# Patient Record
Sex: Female | Born: 1935
Health system: Southern US, Community
[De-identification: ages and names within clinical notes are randomized; demographics above are authoritative.]

## PROBLEM LIST (undated history)

## (undated) DIAGNOSIS — K120 Recurrent oral aphthae: Secondary | ICD-10-CM

## (undated) DIAGNOSIS — K5730 Diverticulosis of large intestine without perforation or abscess without bleeding: Secondary | ICD-10-CM

## (undated) DIAGNOSIS — J309 Allergic rhinitis, unspecified: Secondary | ICD-10-CM

## (undated) DIAGNOSIS — K5900 Constipation, unspecified: Secondary | ICD-10-CM

## (undated) DIAGNOSIS — IMO0002 Reserved for concepts with insufficient information to code with codable children: Secondary | ICD-10-CM

## (undated) DIAGNOSIS — R1310 Dysphagia, unspecified: Secondary | ICD-10-CM

## (undated) DIAGNOSIS — E039 Hypothyroidism, unspecified: Secondary | ICD-10-CM

## (undated) DIAGNOSIS — I10 Essential (primary) hypertension: Secondary | ICD-10-CM

## (undated) DIAGNOSIS — C241 Malignant neoplasm of ampulla of Vater: Secondary | ICD-10-CM

## (undated) DIAGNOSIS — B351 Tinea unguium: Secondary | ICD-10-CM

## (undated) DIAGNOSIS — I739 Peripheral vascular disease, unspecified: Secondary | ICD-10-CM

## (undated) DIAGNOSIS — J3089 Other allergic rhinitis: Secondary | ICD-10-CM

## (undated) DIAGNOSIS — Z800 Family history of malignant neoplasm of digestive organs: Secondary | ICD-10-CM

## (undated) DIAGNOSIS — N6019 Diffuse cystic mastopathy of unspecified breast: Secondary | ICD-10-CM

## (undated) DIAGNOSIS — K222 Esophageal obstruction: Secondary | ICD-10-CM

## (undated) DIAGNOSIS — M25559 Pain in unspecified hip: Secondary | ICD-10-CM

## (undated) DIAGNOSIS — I82409 Acute embolism and thrombosis of unspecified deep veins of unspecified lower extremity: Secondary | ICD-10-CM

## (undated) DIAGNOSIS — K219 Gastro-esophageal reflux disease without esophagitis: Secondary | ICD-10-CM

## (undated) DIAGNOSIS — M810 Age-related osteoporosis without current pathological fracture: Secondary | ICD-10-CM

## (undated) DIAGNOSIS — K859 Acute pancreatitis without necrosis or infection, unspecified: Secondary | ICD-10-CM

## (undated) DIAGNOSIS — R7989 Other specified abnormal findings of blood chemistry: Secondary | ICD-10-CM

## (undated) DIAGNOSIS — R002 Palpitations: Secondary | ICD-10-CM

## (undated) DIAGNOSIS — K648 Other hemorrhoids: Secondary | ICD-10-CM

## (undated) DIAGNOSIS — M1990 Unspecified osteoarthritis, unspecified site: Secondary | ICD-10-CM

## (undated) DIAGNOSIS — R498 Other voice and resonance disorders: Secondary | ICD-10-CM

## (undated) DIAGNOSIS — R1013 Epigastric pain: Secondary | ICD-10-CM

## (undated) DIAGNOSIS — E669 Obesity, unspecified: Secondary | ICD-10-CM

## (undated) DIAGNOSIS — K449 Diaphragmatic hernia without obstruction or gangrene: Secondary | ICD-10-CM

## (undated) DIAGNOSIS — E785 Hyperlipidemia, unspecified: Secondary | ICD-10-CM

## (undated) HISTORY — DX: Allergic rhinitis, unspecified: J30.9

## (undated) HISTORY — DX: Epigastric pain: R10.13

## (undated) HISTORY — DX: Other voice and resonance disorders: R49.8

## (undated) HISTORY — DX: Recurrent oral aphthae: K12.0

## (undated) HISTORY — PX: TONSILLECTOMY AND ADENOIDECTOMY: SUR1326

## (undated) HISTORY — DX: Palpitations: R00.2

## (undated) HISTORY — PX: CATARACT EXTRACTION, BILATERAL: SHX1313

## (undated) HISTORY — PX: CHOLECYSTECTOMY: SHX55

## (undated) HISTORY — DX: Obesity, unspecified: E66.9

## (undated) HISTORY — DX: Diaphragmatic hernia without obstruction or gangrene: K44.9

## (undated) HISTORY — PX: BASAL CELL CARCINOMA EXCISION: SHX1214

## (undated) HISTORY — DX: Hypothyroidism, unspecified: E03.9

## (undated) HISTORY — PX: APPENDECTOMY: SHX54

## (undated) HISTORY — DX: Malignant neoplasm of ampulla of Vater: C24.1

## (undated) HISTORY — DX: Dysphagia, unspecified: R13.10

## (undated) HISTORY — DX: Pain in unspecified hip: M25.559

## (undated) HISTORY — DX: Acute pancreatitis without necrosis or infection, unspecified: K85.90

## (undated) HISTORY — DX: Age-related osteoporosis without current pathological fracture: M81.0

## (undated) HISTORY — DX: Unspecified osteoarthritis, unspecified site: M19.90

## (undated) HISTORY — DX: Peripheral vascular disease, unspecified: I73.9

## (undated) HISTORY — PX: OTHER SURGICAL HISTORY: SHX169

## (undated) HISTORY — DX: Tinea unguium: B35.1

## (undated) HISTORY — DX: Constipation, unspecified: K59.00

## (undated) HISTORY — DX: Esophageal obstruction: K22.2

## (undated) HISTORY — DX: Diffuse cystic mastopathy of unspecified breast: N60.19

## (undated) HISTORY — DX: Other hemorrhoids: K64.8

## (undated) HISTORY — DX: Family history of malignant neoplasm of digestive organs: Z80.0

## (undated) HISTORY — DX: Hyperlipidemia, unspecified: E78.5

## (undated) HISTORY — DX: Reserved for concepts with insufficient information to code with codable children: IMO0002

## (undated) HISTORY — DX: Essential (primary) hypertension: I10

## (undated) HISTORY — DX: Gastro-esophageal reflux disease without esophagitis: K21.9

## (undated) HISTORY — DX: Other allergic rhinitis: J30.89

## (undated) HISTORY — DX: Diverticulosis of large intestine without perforation or abscess without bleeding: K57.30

## (undated) HISTORY — DX: Other specified abnormal findings of blood chemistry: R79.89

---

## 1998-03-03 ENCOUNTER — Ambulatory Visit (HOSPITAL_COMMUNITY): Admission: RE | Admit: 1998-03-03 | Discharge: 1998-03-03 | Payer: Self-pay | Admitting: Internal Medicine

## 1998-05-24 ENCOUNTER — Inpatient Hospital Stay (HOSPITAL_COMMUNITY): Admission: EM | Admit: 1998-05-24 | Discharge: 1998-05-25 | Payer: Self-pay | Admitting: Unknown Physician Specialty

## 1999-05-02 ENCOUNTER — Inpatient Hospital Stay (HOSPITAL_COMMUNITY): Admission: EM | Admit: 1999-05-02 | Discharge: 1999-05-06 | Payer: Self-pay | Admitting: Emergency Medicine

## 1999-05-02 ENCOUNTER — Encounter: Payer: Self-pay | Admitting: Internal Medicine

## 2000-03-01 ENCOUNTER — Other Ambulatory Visit: Admission: RE | Admit: 2000-03-01 | Discharge: 2000-03-01 | Payer: Self-pay | Admitting: Internal Medicine

## 2001-10-15 ENCOUNTER — Encounter: Payer: Self-pay | Admitting: Internal Medicine

## 2004-09-14 ENCOUNTER — Ambulatory Visit: Payer: Self-pay | Admitting: Cardiology

## 2004-09-18 ENCOUNTER — Ambulatory Visit: Payer: Self-pay | Admitting: Cardiology

## 2004-09-18 ENCOUNTER — Inpatient Hospital Stay (HOSPITAL_BASED_OUTPATIENT_CLINIC_OR_DEPARTMENT_OTHER): Admission: RE | Admit: 2004-09-18 | Discharge: 2004-09-18 | Payer: Self-pay | Admitting: Cardiology

## 2004-10-03 ENCOUNTER — Ambulatory Visit: Payer: Self-pay

## 2004-10-13 ENCOUNTER — Ambulatory Visit: Payer: Self-pay

## 2004-11-03 ENCOUNTER — Ambulatory Visit: Payer: Self-pay | Admitting: Cardiology

## 2005-09-21 ENCOUNTER — Ambulatory Visit: Payer: Self-pay | Admitting: Internal Medicine

## 2005-09-25 ENCOUNTER — Ambulatory Visit: Payer: Self-pay | Admitting: Internal Medicine

## 2005-09-25 DIAGNOSIS — K219 Gastro-esophageal reflux disease without esophagitis: Secondary | ICD-10-CM

## 2006-08-20 ENCOUNTER — Ambulatory Visit: Payer: Self-pay | Admitting: Internal Medicine

## 2006-08-21 ENCOUNTER — Ambulatory Visit: Payer: Self-pay | Admitting: Internal Medicine

## 2006-08-27 ENCOUNTER — Ambulatory Visit (HOSPITAL_COMMUNITY): Admission: RE | Admit: 2006-08-27 | Discharge: 2006-08-27 | Payer: Self-pay | Admitting: Internal Medicine

## 2006-09-05 ENCOUNTER — Ambulatory Visit: Payer: Self-pay

## 2006-09-13 ENCOUNTER — Ambulatory Visit: Payer: Self-pay | Admitting: Cardiology

## 2006-10-18 ENCOUNTER — Ambulatory Visit: Payer: Self-pay | Admitting: Cardiology

## 2006-10-18 LAB — CONVERTED CEMR LAB
ALT: 25 units/L (ref 0–40)
AST: 24 units/L (ref 0–37)
Albumin: 3.8 g/dL (ref 3.5–5.2)
Alkaline Phosphatase: 57 units/L (ref 39–117)
Bilirubin, Direct: 0.1 mg/dL (ref 0.0–0.3)
Cholesterol: 216 mg/dL (ref 0–200)
Direct LDL: 113.2 mg/dL
HDL: 80.9 mg/dL (ref >39.0)
Total Bilirubin: 1.1 mg/dL (ref 0.3–1.2)
Total CHOL/HDL Ratio: 2.7
Total Protein: 7 g/dL (ref 6.0–8.3)
Triglycerides: 82 mg/dL (ref 0–149)
VLDL: 16 mg/dL (ref 0–40)

## 2008-10-13 DIAGNOSIS — K648 Other hemorrhoids: Secondary | ICD-10-CM | POA: Insufficient documentation

## 2008-10-13 DIAGNOSIS — I10 Essential (primary) hypertension: Secondary | ICD-10-CM | POA: Insufficient documentation

## 2008-10-13 DIAGNOSIS — F411 Generalized anxiety disorder: Secondary | ICD-10-CM | POA: Insufficient documentation

## 2008-10-13 DIAGNOSIS — E049 Nontoxic goiter, unspecified: Secondary | ICD-10-CM

## 2008-10-13 DIAGNOSIS — I2510 Atherosclerotic heart disease of native coronary artery without angina pectoris: Secondary | ICD-10-CM

## 2008-10-13 DIAGNOSIS — K5730 Diverticulosis of large intestine without perforation or abscess without bleeding: Secondary | ICD-10-CM | POA: Insufficient documentation

## 2008-10-13 DIAGNOSIS — K861 Other chronic pancreatitis: Secondary | ICD-10-CM | POA: Insufficient documentation

## 2008-11-01 ENCOUNTER — Ambulatory Visit: Payer: Self-pay | Admitting: Internal Medicine

## 2008-11-01 DIAGNOSIS — R1310 Dysphagia, unspecified: Secondary | ICD-10-CM | POA: Insufficient documentation

## 2008-11-01 DIAGNOSIS — K224 Dyskinesia of esophagus: Secondary | ICD-10-CM | POA: Insufficient documentation

## 2008-11-01 DIAGNOSIS — E669 Obesity, unspecified: Secondary | ICD-10-CM | POA: Insufficient documentation

## 2008-11-05 ENCOUNTER — Telehealth: Payer: Self-pay | Admitting: Internal Medicine

## 2008-11-10 ENCOUNTER — Ambulatory Visit: Payer: Self-pay | Admitting: Internal Medicine

## 2008-11-10 LAB — CONVERTED CEMR LAB: UREASE: NEGATIVE

## 2009-03-08 ENCOUNTER — Encounter (INDEPENDENT_AMBULATORY_CARE_PROVIDER_SITE_OTHER): Payer: Self-pay | Admitting: *Deleted

## 2010-05-30 ENCOUNTER — Encounter: Payer: Self-pay | Admitting: Internal Medicine

## 2010-10-10 NOTE — Letter (Signed)
Summary: [facility] ([place])   Imported By: Lester Blawenburg 06/15/2010 08:44:02  _____________________________________________________________________  External Attachment:    Type:   Image     Comment:   External Document

## 2010-11-07 ENCOUNTER — Ambulatory Visit
Admission: RE | Admit: 2010-11-07 | Discharge: 2010-11-07 | Disposition: A | Discharge status: Home / Self Care | Payer: Medicare Other | Source: Ambulatory Visit | Attending: Internal Medicine | Admitting: Internal Medicine

## 2010-11-07 ENCOUNTER — Other Ambulatory Visit: Payer: Self-pay | Admitting: Internal Medicine

## 2010-11-07 DIAGNOSIS — R05 Cough: Secondary | ICD-10-CM

## 2011-01-26 NOTE — Cardiovascular Report (Signed)
NAMEELLYANNA, Tonya Russell               ACCOUNT NO.:  [id number]   MEDICAL RECORD NO.:  [id number]          PATIENT TYPE:  OIB   LOCATION:  6501                         FACILITY:  [facility]   PHYSICIAN:  Rollene Rotunda, M.D.   DATE OF BIRTH:  July 21, 1925   DATE OF PROCEDURE:  09/18/2004  DATE OF DISCHARGE:                              CARDIAC CATHETERIZATION   PRIMARY:  Lenon Curt. Chilton Si, M.D.   PROCEDURE:  Left heart catheterization/coronary arteriography.   INDICATION:  Evaluate patient with chest pain and cardiovascular risk  factors.   PROCEDURE NOTE:  Left heart catheterization was performed via right femoral  artery.  The artery was cannulated using anterior wall puncture.  A #4  French arterial sheath was inserted via the modified Seldinger technique.  Preformed Judkins and a pigtail catheter utilized.  Patient tolerated the  procedure well and left the lab in stable condition.   RESULTS:  1.  Hemodynamics: LV 127/12, aortic 125/80.  2.  Coronaries: The left main was normal.  The LAD was a small vessel, not      wrapping the apex.  There were luminal irregularities in mid and distal      vessel.  There was a mid diagonal which was large and normal.  The      circumflex and the AV groove had luminal irregularities.  There was a      small ramus intermediate with ostial 25% stenosis.  There was a large      mid obtuse marginal which was normal.  The right coronary artery was a      large dominant vessel.  There was proximal 25% stenosis.  PDA was large      and normal.  There was one large posterolateral which was normal.  There      were small scattered posterolaterals.  3.  Left ventriculogram: Left ventriculogram was obtained in the RAO      projection.  The EF was 65% with normal wall motion.   CONCLUSION:  Minimal coronary plaque.   NOTE:  The symptoms were reproduced with minimal ventricular ectopy.   PLAN:  I will refer the patient back to Dr. Jens Som for followup of  symptoms possibly related to ectopy.  He also has plans for evaluation of  possible claudication.  She will continue to follow closely with Dr. Murray Hodgkins for management of primary risk reduction.       JH/MEDQ  D:  09/18/2004  T:  09/18/2004  Job:  [id number]   cc:   Lenon Curt. Chilton Si, M.D.  [address].  [place]  [place] [address]  Fax: [phone]

## 2011-01-26 NOTE — Assessment & Plan Note (Signed)
[facility]                            CARDIOLOGY OFFICE NOTE   NAME:SPENCERCythia, Bachtel                        MRN:          [id number]  DATE:09/13/2006                            DOB:          1925/07/06    Mrs. Michalski returns for followup today.  She underwent a cardiac  catheterization in 2006 secondary to exertional chest pain.  She was  found to have nonobstructive coronary disease and normal wall motion.  She saw Tereso Newcomer in the office on August 21, 2006 and was  continuing to complain of chest pain.  She underwent a stress Myoview on  September 05, 2006.  She was found to have an ejection fraction of 72%  with normal perfusion and no ischemia.  Since that time she has not had  any further chest pain.  She occasionally feels dyspnea, but there is no  orthopnea or PND.  She has not had syncope.   Her medications include:  1. Glucosamine.  2. Flaxseed oil.  3. Coral calcium.  4. Garlic dietary supplement.  5. Aspirin 81 mg p.o. daily.  6. Alprazolam as needed.  7. Atenolol 50 mg p.o. daily.  8. Prilosec.  9. Omega-3 fish oil.  10.Senior eye vision tablet.   PHYSICAL EXAMINATION:  Today shows a blood pressure of 160/78 and her  pulse is 76.  NECK:  Is supple.  CHEST:  Her chest is clear.  CARDIOVASCULAR:  Reveals a regular rate and rhythm.  EXTREMITIES:  Show trace edema.   DIAGNOSES:  1. A history of non-obstructive coronary disease/minor plaquing.  2. Significantly elevated LDL (205 on recent lipid panel).  3. Hypertension.  4. History of pancreatitis.  5. History of dysphagia.  6. History of anxiety.   PLAN:  Mrs. Petruzzi has not had any further chest pain and her Myoview  is normal.  We will not pursue further cardiac workup at this point.  Note her LDL has been significantly elevated.  I discussed the benefits  of statin therapy and she has agreed to take Zocor 40 mg p.o. nightly.  She is scheduled to see Dr. Chilton Si in March  and have laboratories drawn  at that time.  We will ask that a lipid panel and liver functions be  drawn and forwarded to [place] for our records.  She will otherwise continue  with her atenolol and she may need an additional medication for her  blood  pressure in the future if it remains elevated, but I will leave it to  Dr. Chilton Si.  I will see her back in 1 year.     Madolyn Frieze Jens Som, MD, [facility]  Electronically Signed    BSC/MedQ  DD: 09/13/2006  DT: 09/13/2006  Job #: [id number]   cc:   Lenon Curt. Chilton Si, M.D.

## 2011-01-26 NOTE — Assessment & Plan Note (Signed)
HEALTHCARE                         GASTROENTEROLOGY OFFICE NOTE   NAME:Tonya Russell, Tonya Russell                        MRN:          [id number]  DATE:08/20/2006                            DOB:          01/03/1925    REFERRING PHYSICIAN:  Lenon Curt. Chilton Si, M.D.   OFFICE CONSULTATION NOTE:   REASON FOR CONSULTATION:  Question of dysphagia.   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  She has undergone prior endoscopic evaluations including colonoscopy and  upper endoscopy with no significant abnormalities noted.  She was last  evaluated in January for reflux disease and dysphagia.  She was placed  on AcipHex and underwent upper endoscopy on September 25, 2005.  The  examination was entirely normal.  She has not been seen since.  She  reports to me doing well until one evening when she awoke with the  sensation that she had some difficulty swallowing.  Obviously, she was  not eating at the time.  She also complained of chest pain and was  concerned that it was her heart.  She had no diaphoresis, and in time  her symptoms resolved.  She was fearful.  Upon closer questioning, she  has had on occasion, though not predictably, some exertional chest  discomfort.  She did undergo evaluation for chest pain in January of  2006.  This led to a cardiac catheterization which revealed  nonobstructive coronary disease and a normal left ventricle.  Currently,  she does not described true esophageal dysphagia, possible oropharyngeal  dysphagia.  No coughing or choking spells.  Her history is just a bit  vague.  No nausea, vomiting or abdominal pain.  Her bowel habits are  regular.   ALLERGIES:  1. LODINE.  2. PENICILLIN.   CURRENT MEDICATIONS:  1. Glucosamine.  2. Flax seed oil.  3. Calcium complex.  4.  Garlic tablet.  5. Vitamin E.  6. Multivitamin.  7. Aspirin.  8. Alprazolam.  9. Atenolol.  10.Calcium with magnesium.  11.Prilosec.  12.Fish oil.  13.Senior Eye Vision.   PHYSICAL EXAMINATION:  GENERAL:  A well-appearing female in no acute  distress.  VITAL SIGNS:  Blood pressure 140/74, heart rate 68, weight 185 pounds.  HEENT:  Sclerae are anicteric.  Conjunctivae are pink.  Oral mucosa  intact.  NECK:  No adenopathy.  LUNGS:  Clear.  HEART:  Regular.  ABDOMEN:  Soft without tenderness, mass or hernia.  Good bowel sounds  heard.   IMPRESSION:  1. Possible oropharyngeal dysphagia; not entirely clear by history.  2. Problems with chest pain intermittently-cause uncertain. Need to be      sure that this is not cardiac.  3. History of reflux disease with negative endoscopy in January of      2007.  4. Remote history of ampullary tumor status post endoscopic resection.   RECOMMENDATIONS:  1. Modified barium swallow with speech pathology to exclude      significant oropharyngeal dysphagia.  If normal, then  reassurance      and return to the care of Dr. Chilton Si.  If abnormal, consider      neurologic evaluation.  2. Refer to Dr. Jens Som for reevaluation regarding chest pain.  I am      awarw that she has had an evaluation in the past, but it has been      almost 2 years, and I think that she should be seen again(in      particular given the occasional exertional nature of the symptoms).  3. Continue proton pump inhibitor therapy.  4. Resume general medical care with Dr. Chilton Si.     Wilhemina Bonito. Marina Goodell, MD  Electronically Signed    JNP/MedQ  DD: 08/20/2006  DT: 08/21/2006  Job #: [id number]   cc:   Lenon Curt. Chilton Si, M.D.  Madolyn Frieze Jens Som, MD, [facility]

## 2011-01-26 NOTE — Assessment & Plan Note (Signed)
[facility]                            CARDIOLOGY OFFICE NOTE   NAME:Tonya Russell, Eber                        MRN:          [id number]  DATE:08/21/2006                            DOB:          03-20-1925    REFERRING PHYSICIAN:  Wilhemina Bonito. Marina Goodell, MD   PRIMARY CARE PHYSICIAN:  Lenon Curt. Chilton Si, M.D.   PRIMARY CARDIOLOGIST:  Madolyn Frieze. Jens Som, MD, [facility].   HISTORY OF PRESENT ILLNESS:  Ms. Haws is a 77 year old female patient  with a history of minimal coronary plaquing by catheterization in  January 2006 who presents to the office today for evaluation of chest  pain at the request of Dr. Yancey Flemings. At catheterization in 2006, she  had a small ramus intermedius with an ostial 25% stenosis and the RCA  with proximal 25% stenosis. The rest of her coronaries were essentially  normal. Her EF was 65% with normal wall motion. The patient has  continued to have chest discomfort since catheterization in 2006 with  exertion. This is somewhat atypical in the fact that she can exert  herself and develop substernal heaviness and shortness of breath. She  can continue to exert herself and the symptoms go away. She denies any  radiation to her arms or jaw. Denies any nausea or diaphoresis. Denies  any syncope or presyncope. She denies any orthopnea or paroxysmal  nocturnal dyspnea. Denies any pleuritic symptoms. Denies any hemoptysis.  She does have a productive cough and had had this for most months of the  year. She has had some symptoms of questionable dysphagia and is  scheduled to undergo barium swallow with speech pathology at the request  of Dr. Marina Goodell.   CURRENT MEDICATIONS:  Glucosamine, flax seed oil, calcium, garlic tabs,  dietary supplement, aspirin 81 mg daily, alprazolam 0.5 mg as needed.  Atenolol 50 mg daily. Calcium plus magnesium with vitamin D. Prilosec 20  mg daily. Omega 3 fish oil. senior eye vision, Tylenol sinus, Advil  p.r.n., Coracidin  p.r.n.   ALLERGIES:  LODINE and PENICILLIN.   SOCIAL HISTORY:  She denies tobacco abuse. She has never been a smoker.   REVIEW OF SYSTEMS:  Please see HPI. Denies any fever, chills, melena,  hematochezia, dysuria. Denies any symptoms consistent with amaurosis  fugax or TIAs. The rest of the review of systems are negative.   PHYSICAL EXAMINATION:  GENERAL:  She is a well-developed, well-nourished  female.  VITAL SIGNS:  Blood pressure 148/63, pulse 73, weight 182 pounds.  HEENT:  Unremarkable.  NECK:  Without JVD and without lymphadenopathy. Carotids without bruits  bilaterally.  CARDIAC:  S1, S2. Regular rate and rhythm without murmurs.  LUNGS:  Clear to auscultation bilaterally without wheeze, rhonchi or  rales.  ABDOMEN:  Soft, nontender with normal active bowel sounds. No  organomegaly.  EXTREMITIES:  Without pitting edema. Calves are soft nontender.  SKIN:  Warm and dry.  NEUROLOGIC:  She is alert and oriented x3. Cranial nerves II-XII are  grossly intact.   Electrocardiogram  revealed sinus rhythm with a heart rate of 68, normal  axis, no  acute changes.   IMPRESSION:  1. Atypical chest pain.  2. History of minimal coronary plaquing by catheterization in 2006.  3. Good left ventricular function.  4. Hypertension.  5. History of dyslipidemia, not on therapy.  6. History of pancreatitis.  7. Questionable dysphagia.      a.     To undergo barium swallow.  8. Anxiety.  9. History of ampullary adenoma status post endoscopic ampullectomy.   PLAN:  The patient presents to the office today with complaints of chest  discomfort. These have some typical and atypical features. She is able  to exert herself and bring the symptoms on but continue to exert herself  and the symptoms abate. There have been no changes in her symptoms since  2006. She does exercise quite frequently. She says that she goes to the  gym 2-3 times a week without any symptoms. To further assess her chest   pain, we will set her up for a stress Myoview study. Some of her  symptoms sound as though they are respiratory in nature. If her stress  test returns negative, I would suggest that she undergo pulmonary  function testing as she does admit to some wheezing from time to time  and has a chronic cough that is productive of sputum and she also notes  some dyspnea with exertion that is fairly stable over the last couple of  years. I will have her followup with Dr. Jens Som in the next several  weeks after her stress test is completed. With her history of minimal  coronary plaquing, she would likely benefit from a statin. We will set  her up for fasting lipid and LFTs.      Tereso Newcomer, PA-C  Electronically Signed      Cecil Cranker, MD, [facility]  Electronically Signed   SW/MedQ  DD: 08/21/2006  DT: 08/21/2006  Job #: [id number]   cc:   Wilhemina Bonito. Marina Goodell, MD  Lenon Curt Chilton Si, M.D.

## 2011-09-17 DIAGNOSIS — M5137 Other intervertebral disc degeneration, lumbosacral region: Secondary | ICD-10-CM | POA: Diagnosis not present

## 2011-09-17 DIAGNOSIS — M5030 Other cervical disc degeneration, unspecified cervical region: Secondary | ICD-10-CM | POA: Diagnosis not present

## 2011-09-17 DIAGNOSIS — M999 Biomechanical lesion, unspecified: Secondary | ICD-10-CM | POA: Diagnosis not present

## 2011-09-17 DIAGNOSIS — M9981 Other biomechanical lesions of cervical region: Secondary | ICD-10-CM | POA: Diagnosis not present

## 2011-09-28 DIAGNOSIS — M25569 Pain in unspecified knee: Secondary | ICD-10-CM | POA: Diagnosis not present

## 2011-09-28 DIAGNOSIS — M1710 Unilateral primary osteoarthritis, unspecified knee: Secondary | ICD-10-CM | POA: Diagnosis not present

## 2011-09-28 DIAGNOSIS — M25579 Pain in unspecified ankle and joints of unspecified foot: Secondary | ICD-10-CM | POA: Diagnosis not present

## 2011-09-28 DIAGNOSIS — S8263XA Displaced fracture of lateral malleolus of unspecified fibula, initial encounter for closed fracture: Secondary | ICD-10-CM | POA: Diagnosis not present

## 2011-10-08 DIAGNOSIS — S8263XA Displaced fracture of lateral malleolus of unspecified fibula, initial encounter for closed fracture: Secondary | ICD-10-CM | POA: Diagnosis not present

## 2011-10-15 DIAGNOSIS — M999 Biomechanical lesion, unspecified: Secondary | ICD-10-CM | POA: Diagnosis not present

## 2011-10-15 DIAGNOSIS — Z1231 Encounter for screening mammogram for malignant neoplasm of breast: Secondary | ICD-10-CM | POA: Diagnosis not present

## 2011-10-15 DIAGNOSIS — M5030 Other cervical disc degeneration, unspecified cervical region: Secondary | ICD-10-CM | POA: Diagnosis not present

## 2011-10-15 DIAGNOSIS — M9981 Other biomechanical lesions of cervical region: Secondary | ICD-10-CM | POA: Diagnosis not present

## 2011-10-15 DIAGNOSIS — M5137 Other intervertebral disc degeneration, lumbosacral region: Secondary | ICD-10-CM | POA: Diagnosis not present

## 2011-10-29 ENCOUNTER — Other Ambulatory Visit: Payer: Self-pay | Admitting: Family Medicine

## 2011-10-29 ENCOUNTER — Ambulatory Visit
Admission: RE | Admit: 2011-10-29 | Discharge: 2011-10-29 | Disposition: A | Discharge status: Home / Self Care | Payer: Medicare Other | Source: Ambulatory Visit | Attending: Family Medicine | Admitting: Family Medicine

## 2011-10-29 ENCOUNTER — Other Ambulatory Visit: Payer: Medicare Other

## 2011-10-29 DIAGNOSIS — R609 Edema, unspecified: Secondary | ICD-10-CM

## 2011-10-29 DIAGNOSIS — S8263XA Displaced fracture of lateral malleolus of unspecified fibula, initial encounter for closed fracture: Secondary | ICD-10-CM | POA: Diagnosis not present

## 2011-10-29 DIAGNOSIS — M79609 Pain in unspecified limb: Secondary | ICD-10-CM | POA: Diagnosis not present

## 2011-11-19 DIAGNOSIS — S8263XA Displaced fracture of lateral malleolus of unspecified fibula, initial encounter for closed fracture: Secondary | ICD-10-CM | POA: Diagnosis not present

## 2011-12-03 DIAGNOSIS — I10 Essential (primary) hypertension: Secondary | ICD-10-CM | POA: Diagnosis not present

## 2011-12-03 DIAGNOSIS — R7989 Other specified abnormal findings of blood chemistry: Secondary | ICD-10-CM | POA: Diagnosis not present

## 2011-12-03 DIAGNOSIS — E039 Hypothyroidism, unspecified: Secondary | ICD-10-CM | POA: Diagnosis not present

## 2011-12-03 DIAGNOSIS — M999 Biomechanical lesion, unspecified: Secondary | ICD-10-CM | POA: Diagnosis not present

## 2011-12-03 DIAGNOSIS — M5137 Other intervertebral disc degeneration, lumbosacral region: Secondary | ICD-10-CM | POA: Diagnosis not present

## 2011-12-03 DIAGNOSIS — M9981 Other biomechanical lesions of cervical region: Secondary | ICD-10-CM | POA: Diagnosis not present

## 2011-12-03 DIAGNOSIS — M5030 Other cervical disc degeneration, unspecified cervical region: Secondary | ICD-10-CM | POA: Diagnosis not present

## 2011-12-05 DIAGNOSIS — E785 Hyperlipidemia, unspecified: Secondary | ICD-10-CM | POA: Diagnosis not present

## 2011-12-05 DIAGNOSIS — E039 Hypothyroidism, unspecified: Secondary | ICD-10-CM | POA: Diagnosis not present

## 2011-12-05 DIAGNOSIS — I739 Peripheral vascular disease, unspecified: Secondary | ICD-10-CM | POA: Diagnosis not present

## 2011-12-05 DIAGNOSIS — I10 Essential (primary) hypertension: Secondary | ICD-10-CM | POA: Diagnosis not present

## 2011-12-17 DIAGNOSIS — S8263XA Displaced fracture of lateral malleolus of unspecified fibula, initial encounter for closed fracture: Secondary | ICD-10-CM | POA: Diagnosis not present

## 2011-12-31 DIAGNOSIS — M5030 Other cervical disc degeneration, unspecified cervical region: Secondary | ICD-10-CM | POA: Diagnosis not present

## 2011-12-31 DIAGNOSIS — M999 Biomechanical lesion, unspecified: Secondary | ICD-10-CM | POA: Diagnosis not present

## 2011-12-31 DIAGNOSIS — M5137 Other intervertebral disc degeneration, lumbosacral region: Secondary | ICD-10-CM | POA: Diagnosis not present

## 2011-12-31 DIAGNOSIS — M9981 Other biomechanical lesions of cervical region: Secondary | ICD-10-CM | POA: Diagnosis not present

## 2012-01-28 DIAGNOSIS — M999 Biomechanical lesion, unspecified: Secondary | ICD-10-CM | POA: Diagnosis not present

## 2012-01-28 DIAGNOSIS — M5137 Other intervertebral disc degeneration, lumbosacral region: Secondary | ICD-10-CM | POA: Diagnosis not present

## 2012-01-28 DIAGNOSIS — M5030 Other cervical disc degeneration, unspecified cervical region: Secondary | ICD-10-CM | POA: Diagnosis not present

## 2012-01-28 DIAGNOSIS — M9981 Other biomechanical lesions of cervical region: Secondary | ICD-10-CM | POA: Diagnosis not present

## 2012-03-31 DIAGNOSIS — M5137 Other intervertebral disc degeneration, lumbosacral region: Secondary | ICD-10-CM | POA: Diagnosis not present

## 2012-03-31 DIAGNOSIS — M5030 Other cervical disc degeneration, unspecified cervical region: Secondary | ICD-10-CM | POA: Diagnosis not present

## 2012-03-31 DIAGNOSIS — M9981 Other biomechanical lesions of cervical region: Secondary | ICD-10-CM | POA: Diagnosis not present

## 2012-03-31 DIAGNOSIS — M999 Biomechanical lesion, unspecified: Secondary | ICD-10-CM | POA: Diagnosis not present

## 2012-04-28 DIAGNOSIS — M5137 Other intervertebral disc degeneration, lumbosacral region: Secondary | ICD-10-CM | POA: Diagnosis not present

## 2012-04-28 DIAGNOSIS — M9981 Other biomechanical lesions of cervical region: Secondary | ICD-10-CM | POA: Diagnosis not present

## 2012-04-28 DIAGNOSIS — M999 Biomechanical lesion, unspecified: Secondary | ICD-10-CM | POA: Diagnosis not present

## 2012-04-28 DIAGNOSIS — M5030 Other cervical disc degeneration, unspecified cervical region: Secondary | ICD-10-CM | POA: Diagnosis not present

## 2012-05-19 DIAGNOSIS — H52209 Unspecified astigmatism, unspecified eye: Secondary | ICD-10-CM | POA: Diagnosis not present

## 2012-05-19 DIAGNOSIS — H04129 Dry eye syndrome of unspecified lacrimal gland: Secondary | ICD-10-CM | POA: Diagnosis not present

## 2012-05-19 DIAGNOSIS — H40059 Ocular hypertension, unspecified eye: Secondary | ICD-10-CM | POA: Diagnosis not present

## 2012-05-19 DIAGNOSIS — H01009 Unspecified blepharitis unspecified eye, unspecified eyelid: Secondary | ICD-10-CM | POA: Diagnosis not present

## 2012-05-26 DIAGNOSIS — M9981 Other biomechanical lesions of cervical region: Secondary | ICD-10-CM | POA: Diagnosis not present

## 2012-05-26 DIAGNOSIS — M5137 Other intervertebral disc degeneration, lumbosacral region: Secondary | ICD-10-CM | POA: Diagnosis not present

## 2012-05-26 DIAGNOSIS — M5030 Other cervical disc degeneration, unspecified cervical region: Secondary | ICD-10-CM | POA: Diagnosis not present

## 2012-05-26 DIAGNOSIS — M999 Biomechanical lesion, unspecified: Secondary | ICD-10-CM | POA: Diagnosis not present

## 2012-06-16 DIAGNOSIS — E039 Hypothyroidism, unspecified: Secondary | ICD-10-CM | POA: Diagnosis not present

## 2012-06-16 DIAGNOSIS — M9981 Other biomechanical lesions of cervical region: Secondary | ICD-10-CM | POA: Diagnosis not present

## 2012-06-16 DIAGNOSIS — R7989 Other specified abnormal findings of blood chemistry: Secondary | ICD-10-CM | POA: Diagnosis not present

## 2012-06-16 DIAGNOSIS — M5137 Other intervertebral disc degeneration, lumbosacral region: Secondary | ICD-10-CM | POA: Diagnosis not present

## 2012-06-16 DIAGNOSIS — I10 Essential (primary) hypertension: Secondary | ICD-10-CM | POA: Diagnosis not present

## 2012-06-16 DIAGNOSIS — E785 Hyperlipidemia, unspecified: Secondary | ICD-10-CM | POA: Diagnosis not present

## 2012-06-16 DIAGNOSIS — M5030 Other cervical disc degeneration, unspecified cervical region: Secondary | ICD-10-CM | POA: Diagnosis not present

## 2012-06-16 DIAGNOSIS — M999 Biomechanical lesion, unspecified: Secondary | ICD-10-CM | POA: Diagnosis not present

## 2012-06-18 DIAGNOSIS — I10 Essential (primary) hypertension: Secondary | ICD-10-CM | POA: Diagnosis not present

## 2012-06-18 DIAGNOSIS — Z23 Encounter for immunization: Secondary | ICD-10-CM | POA: Diagnosis not present

## 2012-06-18 DIAGNOSIS — D044 Carcinoma in situ of skin of scalp and neck: Secondary | ICD-10-CM | POA: Diagnosis not present

## 2012-06-18 DIAGNOSIS — E039 Hypothyroidism, unspecified: Secondary | ICD-10-CM | POA: Diagnosis not present

## 2012-07-14 DIAGNOSIS — M5137 Other intervertebral disc degeneration, lumbosacral region: Secondary | ICD-10-CM | POA: Diagnosis not present

## 2012-07-14 DIAGNOSIS — M9981 Other biomechanical lesions of cervical region: Secondary | ICD-10-CM | POA: Diagnosis not present

## 2012-07-14 DIAGNOSIS — M999 Biomechanical lesion, unspecified: Secondary | ICD-10-CM | POA: Diagnosis not present

## 2012-07-14 DIAGNOSIS — M5030 Other cervical disc degeneration, unspecified cervical region: Secondary | ICD-10-CM | POA: Diagnosis not present

## 2012-08-11 DIAGNOSIS — M999 Biomechanical lesion, unspecified: Secondary | ICD-10-CM | POA: Diagnosis not present

## 2012-08-11 DIAGNOSIS — M9981 Other biomechanical lesions of cervical region: Secondary | ICD-10-CM | POA: Diagnosis not present

## 2012-08-11 DIAGNOSIS — M5030 Other cervical disc degeneration, unspecified cervical region: Secondary | ICD-10-CM | POA: Diagnosis not present

## 2012-08-11 DIAGNOSIS — M5137 Other intervertebral disc degeneration, lumbosacral region: Secondary | ICD-10-CM | POA: Diagnosis not present

## 2012-09-08 DIAGNOSIS — M9981 Other biomechanical lesions of cervical region: Secondary | ICD-10-CM | POA: Diagnosis not present

## 2012-09-08 DIAGNOSIS — M999 Biomechanical lesion, unspecified: Secondary | ICD-10-CM | POA: Diagnosis not present

## 2012-09-08 DIAGNOSIS — M5137 Other intervertebral disc degeneration, lumbosacral region: Secondary | ICD-10-CM | POA: Diagnosis not present

## 2012-09-08 DIAGNOSIS — M5030 Other cervical disc degeneration, unspecified cervical region: Secondary | ICD-10-CM | POA: Diagnosis not present

## 2012-10-06 DIAGNOSIS — M5030 Other cervical disc degeneration, unspecified cervical region: Secondary | ICD-10-CM | POA: Diagnosis not present

## 2012-10-06 DIAGNOSIS — M5137 Other intervertebral disc degeneration, lumbosacral region: Secondary | ICD-10-CM | POA: Diagnosis not present

## 2012-10-06 DIAGNOSIS — M999 Biomechanical lesion, unspecified: Secondary | ICD-10-CM | POA: Diagnosis not present

## 2012-10-06 DIAGNOSIS — M9981 Other biomechanical lesions of cervical region: Secondary | ICD-10-CM | POA: Diagnosis not present

## 2012-10-15 DIAGNOSIS — Z1231 Encounter for screening mammogram for malignant neoplasm of breast: Secondary | ICD-10-CM | POA: Diagnosis not present

## 2012-11-03 DIAGNOSIS — M5030 Other cervical disc degeneration, unspecified cervical region: Secondary | ICD-10-CM | POA: Diagnosis not present

## 2012-11-03 DIAGNOSIS — M999 Biomechanical lesion, unspecified: Secondary | ICD-10-CM | POA: Diagnosis not present

## 2012-11-03 DIAGNOSIS — M5137 Other intervertebral disc degeneration, lumbosacral region: Secondary | ICD-10-CM | POA: Diagnosis not present

## 2012-11-03 DIAGNOSIS — M9981 Other biomechanical lesions of cervical region: Secondary | ICD-10-CM | POA: Diagnosis not present

## 2012-12-01 DIAGNOSIS — M5030 Other cervical disc degeneration, unspecified cervical region: Secondary | ICD-10-CM | POA: Diagnosis not present

## 2012-12-01 DIAGNOSIS — M5137 Other intervertebral disc degeneration, lumbosacral region: Secondary | ICD-10-CM | POA: Diagnosis not present

## 2012-12-01 DIAGNOSIS — M999 Biomechanical lesion, unspecified: Secondary | ICD-10-CM | POA: Diagnosis not present

## 2012-12-01 DIAGNOSIS — M9981 Other biomechanical lesions of cervical region: Secondary | ICD-10-CM | POA: Diagnosis not present

## 2012-12-02 ENCOUNTER — Other Ambulatory Visit: Payer: Self-pay | Admitting: *Deleted

## 2012-12-02 DIAGNOSIS — I10 Essential (primary) hypertension: Secondary | ICD-10-CM

## 2012-12-02 DIAGNOSIS — R7989 Other specified abnormal findings of blood chemistry: Secondary | ICD-10-CM

## 2012-12-02 DIAGNOSIS — D044 Carcinoma in situ of skin of scalp and neck: Secondary | ICD-10-CM

## 2012-12-02 DIAGNOSIS — E039 Hypothyroidism, unspecified: Secondary | ICD-10-CM

## 2012-12-09 ENCOUNTER — Other Ambulatory Visit: Payer: Medicare Other

## 2012-12-09 DIAGNOSIS — R7989 Other specified abnormal findings of blood chemistry: Secondary | ICD-10-CM | POA: Diagnosis not present

## 2012-12-09 DIAGNOSIS — E039 Hypothyroidism, unspecified: Secondary | ICD-10-CM | POA: Diagnosis not present

## 2012-12-09 DIAGNOSIS — D044 Carcinoma in situ of skin of scalp and neck: Secondary | ICD-10-CM | POA: Diagnosis not present

## 2012-12-09 DIAGNOSIS — I10 Essential (primary) hypertension: Secondary | ICD-10-CM

## 2012-12-10 LAB — LIPID PANEL
Cholesterol, Total: 193 mg/dL (ref 100–199)
HDL: 97 mg/dL (ref >39)
Triglycerides: 76 mg/dL (ref 0–149)

## 2012-12-10 LAB — MICROALBUMIN / CREATININE URINE RATIO
MICROALB/CREAT RATIO: 12.8 mg/g creat (ref 0.0–30.0)
Microalbumin, Urine: 15.4 ug/mL (ref 0.0–17.0)

## 2012-12-10 LAB — COMPREHENSIVE METABOLIC PANEL
ALT: 20 IU/L (ref 0–32)
AST: 21 IU/L (ref 0–40)
CO2: 25 mmol/L (ref 19–28)
Calcium: 9.3 mg/dL (ref 8.6–10.2)
Creatinine, Ser: 0.81 mg/dL (ref 0.57–1.00)
Globulin, Total: 2 g/dL (ref 1.5–4.5)
Potassium: 3.8 mmol/L (ref 3.5–5.2)
Sodium: 143 mmol/L (ref 134–144)

## 2012-12-10 LAB — TSH: TSH: 1.72 u[IU]/mL (ref 0.450–4.500)

## 2012-12-17 ENCOUNTER — Encounter: Payer: Self-pay | Admitting: Geriatric Medicine

## 2012-12-17 ENCOUNTER — Encounter: Payer: Self-pay | Admitting: Internal Medicine

## 2012-12-17 ENCOUNTER — Ambulatory Visit (INDEPENDENT_AMBULATORY_CARE_PROVIDER_SITE_OTHER): Payer: Medicare Other | Admitting: Internal Medicine

## 2012-12-17 VITALS — BP 138/76 | HR 56 | Temp 97.7°F | Resp 16 | Ht 63.0 in | Wt 182.6 lb

## 2012-12-17 DIAGNOSIS — R739 Hyperglycemia, unspecified: Secondary | ICD-10-CM | POA: Insufficient documentation

## 2012-12-17 DIAGNOSIS — E039 Hypothyroidism, unspecified: Secondary | ICD-10-CM

## 2012-12-17 DIAGNOSIS — R7989 Other specified abnormal findings of blood chemistry: Secondary | ICD-10-CM | POA: Diagnosis not present

## 2012-12-17 DIAGNOSIS — E785 Hyperlipidemia, unspecified: Secondary | ICD-10-CM

## 2012-12-17 DIAGNOSIS — I10 Essential (primary) hypertension: Secondary | ICD-10-CM | POA: Diagnosis not present

## 2012-12-17 DIAGNOSIS — I739 Peripheral vascular disease, unspecified: Secondary | ICD-10-CM

## 2012-12-17 DIAGNOSIS — C241 Malignant neoplasm of ampulla of Vater: Secondary | ICD-10-CM

## 2012-12-17 DIAGNOSIS — R1013 Epigastric pain: Secondary | ICD-10-CM

## 2012-12-17 NOTE — Patient Instructions (Signed)
Continue current medications. 

## 2012-12-17 NOTE — Progress Notes (Signed)
Date: 12/17/2012  MRN:  [id number] Name:  Geniva Lohnes Sex:  female Age:  79 y.o. DOB:November 20, 1924   [facility] #:           [phone]            Provider: A. Jarrett Albor  Emergency Contacts: Contact Information   Name Relation Home Work Mobile   CAUSEY,EDNA  [phone]        Code Status: Living will  Allergies: Allergies  Allergen Reactions  . Codeine   . Lodine (Etodolac)   . Penicillins      Chief Complaint  Patient presents with  . Annual Exam  . Burning and stinging under breast     HPI: Recently had an itch under left breast. Had a sore feeling under the sternum.  One episode of substernal burning.Complaints of epigastric pains from time to time.  Recent thyroid test normal.  BP under good control.  Past Medical History  Diagnosis Date  . Pain in joint, pelvic region and thigh   . Other voice and resonance disorders   . Oral aphthae   . Torus fracture of fibula   . Abdominal pain, epigastric   . Palpitations   . Dysphagia, unspecified   . Allergic rhinitis, cause unspecified   . Dermatophytosis of nail   . Internal hemorrhoids without mention of complication   . Other abnormal blood chemistry   . Allergic rhinitis due to other allergen   . Esophageal reflux   . Unspecified constipation   . Unspecified hypothyroidism   . Hyperlipidemia   . Hypertension   . Senile osteoporosis   . Diverticulosis of colon (without mention of hemorrhage)   . Malignant neoplasm of ampulla of Vater   . Peripheral vascular disease, unspecified   . Osteoarthrosis, unspecified whether generalized or localized, unspecified site   . Diffuse cystic mastopathy   . Family history of malignant neoplasm of gastrointestinal tract     Past Surgical History  Procedure Laterality Date  . Appendectomy    . Cholecystectomy    . Tonsillectomy and adenoidectomy    . Cyst excision      Left upper buttock  . Cataract extraction, bilateral    . Resection of ampullary tumor    . Basal cell  carcinoma excision      right humerus, right temple, left side of nose  . Squamous cell cancer      Right leg  . Basal cell carcinoma excision      chest, left cheek  . Keratosis      left arm     Procedures: 1961-IVP/UGI/OCG:Normal/Normal/Normal 1982-UGI:Normal 1996-Flex sig/BaE:Small internal hemorrhoids;diverticulosis 1999-ERCP: Swollen ampulla of vater; pancreatic and biliary stent placement, percutaneous sphincterectomy 1999-Endoscopic removal of tumor at ampulla of vater-[facility] 2000-ERCP: No evidence of return of neoplasm.-Dr.Perry 2003-Colonoscopy:Diverticulosis, hemorrhoids 2004-CT Abd./Pelvic: bilateral renal cysts; s/p cholecystectomy 2005-Mammogram 2006-Heart Cath. 2006-Colonoscopy-Dr.Perry 10/04/2005-Mammogram 05/08/2006 Cat Scan 08/21/2006 Stress Test  2007-Dexa Scan  2007-Chest X-ray 09/30/2006 Mammogram  10/02/2007 Mammogram  10/13/2010 Mammogram Negative  11/07/2010 Chest X-Ray No evidence of pneumonia or other active cardiopulmonary process  10/15/2011 Mammogram Negative   Consultants: Lorriane Shire Ophthalmology-Dr.Rollins GI-Dr.Orr and Dr.Perry Dermatology-Dr.Nolan Chiropractor-Dr.Spell Cardiology-Dr.Crenshaw    Current Outpatient Prescriptions  Medication Sig Dispense Refill  . ALPRAZolam (XANAX) 0.5 MG tablet Take one tablet up to three times daily for anxiety or rest as needed      . aspirin 81 MG tablet Take one tablet once daily to prevent stroke      . atenolol (TENORMIN) 50 MG  tablet Take one tablet once daily for blood pressure      . Calcium-Magnesium-Vitamin D 500-250-200 MG-MG-UNIT TABS Take one tablet once daily for supplement      . glucosamine-chondroitin 500-400 MG tablet Take one tablet three times daily      . levothyroxine (SYNTHROID, LEVOTHROID) 50 MCG tablet Take one tablet once daily for thyroid      . Omega-3 Fatty Acids (FISH OIL) 1200 MG CAPS Take one tablet once daily for cholesterol      . omeprazole (PRILOSEC) 20 MG  capsule Take one tablet once daily for stomach      . simvastatin (ZOCOR) 40 MG tablet Take one tablet once daily for cholesterol       No current facility-administered medications for this visit.    Immunization History  Administered Date(s) Administered  . DTaP 09/11/2007  . Influenza-Generic 07/09/2012  . Pneumococcal-Generic 09/10/1992     Diet:Regular  History  Substance Use Topics  . Smoking status: Never Smoker   . Smokeless tobacco: Not on file  . Alcohol Use: No    Family History  Problem Relation Age of Onset  . Cancer Mother     face  . Cancer Father 51    colon  . Cancer Sister     Review of systems Constitutional: positive for anorexia and malaise, negative for night sweats, sweats and weight loss Eyes: negative Ears, nose, mouth, throat, and face: negative Respiratory: negative Cardiovascular: negative for chest pressure/discomfort, dyspnea, exertional chest pressure/discomfort, fatigue and syncope, cardiac catheterization in 2006 showed nonobstructive plaques of the coronaries. Gastrointestinal: chronic abdominal pain recurring frequently. Abdominal pain in the epigastrium. Loss of appetite. No complaints of diarrhea or constipation. Occasional substernal discomfort. No vomiting. History of pancreatitis, neoplasm at the ampulla of Vater. She is status post endoscopic surgery. Genitourinary:negative Integument/breast: positive for dryness Hematologic/lymphatic: negative Musculoskeletal:positive for arthralgias Neurological: negative Behavioral/Psych: positive for anxiety Endocrine: negative, history diabetes Allergic/Immunologic: negative  Vital signs: BP 138/76  Pulse 56  Temp(Src) 97.7 F (36.5 C) (Oral)  Resp 16  Ht 5\' 3"  (1.6 m)  Wt 182 lb 9.6 oz (82.827 kg)  BMI 32.35 kg/m2   General Appearance:    Alert, cooperative, no distress, appears stated age  Head:    Normocephalic, without obvious abnormality, atraumatic  Eyes:    PERRL,  conjunctiva/corneas clear, EOM's intact, fundi    benign, both eyes  Ears:    Normal TM's and external ear canals, both ears  Nose:   Nares normal, septum midline, mucosa normal, no drainage    or sinus tenderness  Throat:   Lips, mucosa, and tongue normal; teeth and gums normal  Neck:   Supple, symmetrical, trachea midline, no adenopathy;    thyroid:  no enlargement/tenderness/nodules; no carotid   bruit or JVD  Back:     Symmetric, no curvature, ROM normal, no CVA tenderness  Lungs:     Clear to auscultation bilaterally, respirations unlabored  Chest Wall:    No tenderness or deformity   Heart:    Regular rate and rhythm, S1 and S2 normal, no murmur, rub   or gallop  Breast Exam:    No tenderness, masses, or nipple abnormality  Abdomen:     Soft, non-tender, bowel sounds active all four quadrants,    no masses, no organomegaly  Genitalia:    Normal female without lesion, discharge or tenderness  Rectal:    Normal tone, normal prostate, no masses or tenderness;   guaiac negative stool  Extremities:   Extremities normal, atraumatic, no cyanosis or edema  Pulses:   2+ and symmetric all extremities  Skin:   Skin color, texture, turgor normal, no rashes or lesions.Multiple actinic keratoses. Appendectomy scar noted. Laparoscopic cholecystectomy scar. Sebaceous cyst and left pinna and right nasolabial area. Small growth in the right upper eyelid. Skin tag left eyebrow.  Lymph nodes:   Cervical, supraclavicular, and axillary nodes normal  Neurologic:   CNII-XII intact, normal strength, sensation and reflexes    throughout     Screening Score  MMS    PHQ2 0  PHQ9     Fall Risk    BIMS    Annual summary: Hospitalizations: none in last year Infection History: No significant infections Functional assessment: Independence in all ADL Areas of potential improvement:Unlikely Rehabilitation Potential:Not pertinent Prognosis for survival:Good  Plan: 1. Other abnormal blood  chemistry Hyperglycemia is under good control  2. Hyperlipidemia Controlled - Lipid Panel; Future  3. Hypertension Controlled - CMP; Future  4. Malignant neoplasm of ampulla of Vater No evidence of relapse  5. Peripheral vascular disease, unspecified Mild and asymptomatic  6. Unspecified hypothyroidism Controlled - TSH; Future  7. Abdominal pain, epigastric Mild

## 2012-12-29 DIAGNOSIS — M999 Biomechanical lesion, unspecified: Secondary | ICD-10-CM | POA: Diagnosis not present

## 2012-12-29 DIAGNOSIS — M5030 Other cervical disc degeneration, unspecified cervical region: Secondary | ICD-10-CM | POA: Diagnosis not present

## 2012-12-29 DIAGNOSIS — M9981 Other biomechanical lesions of cervical region: Secondary | ICD-10-CM | POA: Diagnosis not present

## 2012-12-29 DIAGNOSIS — M5137 Other intervertebral disc degeneration, lumbosacral region: Secondary | ICD-10-CM | POA: Diagnosis not present

## 2013-01-26 DIAGNOSIS — E039 Hypothyroidism, unspecified: Secondary | ICD-10-CM | POA: Insufficient documentation

## 2013-01-26 DIAGNOSIS — R1013 Epigastric pain: Secondary | ICD-10-CM | POA: Insufficient documentation

## 2013-01-26 DIAGNOSIS — M999 Biomechanical lesion, unspecified: Secondary | ICD-10-CM | POA: Diagnosis not present

## 2013-01-26 DIAGNOSIS — M9981 Other biomechanical lesions of cervical region: Secondary | ICD-10-CM | POA: Diagnosis not present

## 2013-01-26 DIAGNOSIS — M5137 Other intervertebral disc degeneration, lumbosacral region: Secondary | ICD-10-CM | POA: Diagnosis not present

## 2013-01-26 DIAGNOSIS — M5030 Other cervical disc degeneration, unspecified cervical region: Secondary | ICD-10-CM | POA: Diagnosis not present

## 2013-02-23 DIAGNOSIS — M9981 Other biomechanical lesions of cervical region: Secondary | ICD-10-CM | POA: Diagnosis not present

## 2013-02-23 DIAGNOSIS — M5137 Other intervertebral disc degeneration, lumbosacral region: Secondary | ICD-10-CM | POA: Diagnosis not present

## 2013-02-23 DIAGNOSIS — M999 Biomechanical lesion, unspecified: Secondary | ICD-10-CM | POA: Diagnosis not present

## 2013-02-23 DIAGNOSIS — M5030 Other cervical disc degeneration, unspecified cervical region: Secondary | ICD-10-CM | POA: Diagnosis not present

## 2013-03-23 DIAGNOSIS — M9981 Other biomechanical lesions of cervical region: Secondary | ICD-10-CM | POA: Diagnosis not present

## 2013-03-23 DIAGNOSIS — M5030 Other cervical disc degeneration, unspecified cervical region: Secondary | ICD-10-CM | POA: Diagnosis not present

## 2013-03-23 DIAGNOSIS — M999 Biomechanical lesion, unspecified: Secondary | ICD-10-CM | POA: Diagnosis not present

## 2013-03-23 DIAGNOSIS — M5137 Other intervertebral disc degeneration, lumbosacral region: Secondary | ICD-10-CM | POA: Diagnosis not present

## 2013-04-07 DIAGNOSIS — L821 Other seborrheic keratosis: Secondary | ICD-10-CM | POA: Diagnosis not present

## 2013-04-07 DIAGNOSIS — C44621 Squamous cell carcinoma of skin of unspecified upper limb, including shoulder: Secondary | ICD-10-CM | POA: Diagnosis not present

## 2013-04-07 DIAGNOSIS — L570 Actinic keratosis: Secondary | ICD-10-CM | POA: Diagnosis not present

## 2013-04-14 DIAGNOSIS — M9981 Other biomechanical lesions of cervical region: Secondary | ICD-10-CM | POA: Diagnosis not present

## 2013-04-14 DIAGNOSIS — M999 Biomechanical lesion, unspecified: Secondary | ICD-10-CM | POA: Diagnosis not present

## 2013-04-14 DIAGNOSIS — M5137 Other intervertebral disc degeneration, lumbosacral region: Secondary | ICD-10-CM | POA: Diagnosis not present

## 2013-04-14 DIAGNOSIS — M5030 Other cervical disc degeneration, unspecified cervical region: Secondary | ICD-10-CM | POA: Diagnosis not present

## 2013-04-15 DIAGNOSIS — M5030 Other cervical disc degeneration, unspecified cervical region: Secondary | ICD-10-CM | POA: Diagnosis not present

## 2013-04-15 DIAGNOSIS — M9981 Other biomechanical lesions of cervical region: Secondary | ICD-10-CM | POA: Diagnosis not present

## 2013-04-15 DIAGNOSIS — M5137 Other intervertebral disc degeneration, lumbosacral region: Secondary | ICD-10-CM | POA: Diagnosis not present

## 2013-04-15 DIAGNOSIS — M999 Biomechanical lesion, unspecified: Secondary | ICD-10-CM | POA: Diagnosis not present

## 2013-04-16 DIAGNOSIS — M5030 Other cervical disc degeneration, unspecified cervical region: Secondary | ICD-10-CM | POA: Diagnosis not present

## 2013-04-16 DIAGNOSIS — M5137 Other intervertebral disc degeneration, lumbosacral region: Secondary | ICD-10-CM | POA: Diagnosis not present

## 2013-04-16 DIAGNOSIS — M9981 Other biomechanical lesions of cervical region: Secondary | ICD-10-CM | POA: Diagnosis not present

## 2013-04-16 DIAGNOSIS — M999 Biomechanical lesion, unspecified: Secondary | ICD-10-CM | POA: Diagnosis not present

## 2013-05-14 DIAGNOSIS — M999 Biomechanical lesion, unspecified: Secondary | ICD-10-CM | POA: Diagnosis not present

## 2013-05-14 DIAGNOSIS — M5137 Other intervertebral disc degeneration, lumbosacral region: Secondary | ICD-10-CM | POA: Diagnosis not present

## 2013-05-14 DIAGNOSIS — M9981 Other biomechanical lesions of cervical region: Secondary | ICD-10-CM | POA: Diagnosis not present

## 2013-05-14 DIAGNOSIS — M5030 Other cervical disc degeneration, unspecified cervical region: Secondary | ICD-10-CM | POA: Diagnosis not present

## 2013-05-18 DIAGNOSIS — C44621 Squamous cell carcinoma of skin of unspecified upper limb, including shoulder: Secondary | ICD-10-CM | POA: Diagnosis not present

## 2013-05-18 DIAGNOSIS — L723 Sebaceous cyst: Secondary | ICD-10-CM | POA: Diagnosis not present

## 2013-05-18 DIAGNOSIS — L570 Actinic keratosis: Secondary | ICD-10-CM | POA: Diagnosis not present

## 2013-05-25 DIAGNOSIS — H01009 Unspecified blepharitis unspecified eye, unspecified eyelid: Secondary | ICD-10-CM | POA: Diagnosis not present

## 2013-05-25 DIAGNOSIS — Z961 Presence of intraocular lens: Secondary | ICD-10-CM | POA: Diagnosis not present

## 2013-05-25 DIAGNOSIS — D2310 Other benign neoplasm of skin of unspecified eyelid, including canthus: Secondary | ICD-10-CM | POA: Diagnosis not present

## 2013-05-25 DIAGNOSIS — H40059 Ocular hypertension, unspecified eye: Secondary | ICD-10-CM | POA: Diagnosis not present

## 2013-06-15 ENCOUNTER — Other Ambulatory Visit: Payer: Medicare Other

## 2013-06-15 DIAGNOSIS — E039 Hypothyroidism, unspecified: Secondary | ICD-10-CM

## 2013-06-15 DIAGNOSIS — M5137 Other intervertebral disc degeneration, lumbosacral region: Secondary | ICD-10-CM | POA: Diagnosis not present

## 2013-06-15 DIAGNOSIS — E785 Hyperlipidemia, unspecified: Secondary | ICD-10-CM | POA: Diagnosis not present

## 2013-06-15 DIAGNOSIS — I10 Essential (primary) hypertension: Secondary | ICD-10-CM | POA: Diagnosis not present

## 2013-06-15 DIAGNOSIS — M999 Biomechanical lesion, unspecified: Secondary | ICD-10-CM | POA: Diagnosis not present

## 2013-06-15 DIAGNOSIS — M5030 Other cervical disc degeneration, unspecified cervical region: Secondary | ICD-10-CM | POA: Diagnosis not present

## 2013-06-15 DIAGNOSIS — M9981 Other biomechanical lesions of cervical region: Secondary | ICD-10-CM | POA: Diagnosis not present

## 2013-06-16 LAB — COMPREHENSIVE METABOLIC PANEL
AST: 20 IU/L (ref 0–40)
Albumin/Globulin Ratio: 2 (ref 1.1–2.5)
Albumin: 4.3 g/dL (ref 3.5–4.7)
Alkaline Phosphatase: 47 IU/L (ref 39–117)
BUN/Creatinine Ratio: 18 (ref 11–26)
Creatinine, Ser: 0.71 mg/dL (ref 0.57–1.00)
GFR calc non Af Amer: 77 mL/min/{1.73_m2} (ref >59)
Globulin, Total: 2.1 g/dL (ref 1.5–4.5)
Sodium: 144 mmol/L (ref 134–144)

## 2013-06-16 LAB — TSH: TSH: 2.38 u[IU]/mL (ref 0.450–4.500)

## 2013-06-16 LAB — LIPID PANEL
Cholesterol, Total: 181 mg/dL (ref 100–199)
Triglycerides: 66 mg/dL (ref 0–149)

## 2013-06-17 ENCOUNTER — Encounter: Payer: Self-pay | Admitting: Internal Medicine

## 2013-06-17 ENCOUNTER — Ambulatory Visit (INDEPENDENT_AMBULATORY_CARE_PROVIDER_SITE_OTHER): Payer: Medicare Other | Admitting: Internal Medicine

## 2013-06-17 VITALS — BP 118/60 | HR 65 | Temp 98.2°F | Resp 18 | Ht 63.0 in | Wt 180.2 lb

## 2013-06-17 DIAGNOSIS — R739 Hyperglycemia, unspecified: Secondary | ICD-10-CM

## 2013-06-17 DIAGNOSIS — I10 Essential (primary) hypertension: Secondary | ICD-10-CM | POA: Diagnosis not present

## 2013-06-17 DIAGNOSIS — E785 Hyperlipidemia, unspecified: Secondary | ICD-10-CM

## 2013-06-17 DIAGNOSIS — Z23 Encounter for immunization: Secondary | ICD-10-CM

## 2013-06-17 DIAGNOSIS — C241 Malignant neoplasm of ampulla of Vater: Secondary | ICD-10-CM

## 2013-06-17 DIAGNOSIS — E039 Hypothyroidism, unspecified: Secondary | ICD-10-CM

## 2013-06-17 DIAGNOSIS — R7309 Other abnormal glucose: Secondary | ICD-10-CM

## 2013-06-17 DIAGNOSIS — K219 Gastro-esophageal reflux disease without esophagitis: Secondary | ICD-10-CM

## 2013-06-17 NOTE — Progress Notes (Signed)
Subjective:    Patient ID: Tonya Russell, female    DOB: November 12, 1924, 79 y.o.   MRN: [id number]   Chief Complaint  Patient presents with  . 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 ALPRAZolam (XANAX) 0.5 MG tablet Take one tablet up to three times daily for anxiety or rest as needed      . aspirin 81 MG tablet Take one tablet once daily to prevent stroke      . atenolol (TENORMIN) 50 MG tablet Take one tablet once daily for blood pressure      . Calcium-Magnesium-Vitamin D 500-250-200 MG-MG-UNIT TABS Take one tablet once daily for supplement      . glucosamine-chondroitin 500-400 MG tablet Take one tablet three times daily      . levothyroxine (SYNTHROID, LEVOTHROID) 50 MCG tablet Take one tablet once daily for thyroid      . Omega-3 Fatty Acids (FISH OIL) 1200 MG CAPS Take one tablet once daily for cholesterol      . omeprazole (PRILOSEC) 20 MG capsule Take one tablet once daily for stomach      . simvastatin (ZOCOR) 40 MG tablet Take one tablet once daily for cholesterol       No current facility-administered medications on file prior to visit.    Review of Systems  Constitutional: Negative for fever, chills, activity change, appetite change, fatigue and unexpected weight change.  HENT: Negative for congestion, dental problem and trouble swallowing.   Eyes: Positive for visual disturbance.  Respiratory: Positive for apnea. Negative for choking, chest tightness and shortness of breath.   Cardiovascular: Negative for chest pain, palpitations and leg swelling.  Gastrointestinal: Positive for constipation. Negative for abdominal pain  and abdominal distention.       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 S/P endoscopic surgery.  Endocrine: Negative.        Hyperglycemia.  Genitourinary: Negative.   Musculoskeletal: Positive for arthralgias. Negative for back pain, neck pain and neck stiffness.  Skin:       Residual lump at the left triceps where she had a SCC removed by Dr. Terri Piedra 2014. Residual AK of right triceps.  Allergic/Immunologic: Negative.   Neurological: Negative for dizziness, facial asymmetry and headaches.  Psychiatric/Behavioral: Negative for behavioral problems, confusion and agitation.       Objective:BP 118/60  Pulse 65  Temp(Src) 98.2 F (36.8 C) (Oral)  Resp 18  Ht 5\' 3"  (1.6 m)  Wt 180 lb 3.2 oz (81.738 kg)  BMI 31.93 kg/m2  SpO2 97%    Physical Exam  Constitutional: She is oriented to person, place, and time. She appears well-developed and well-nourished. She appears distressed.  HENT:  Head: Normocephalic and atraumatic.  Right Ear: External ear normal.  Left Ear: External ear normal.  Nose: Nose normal.  Mouth/Throat: Oropharynx is clear and moist.  Eyes: Conjunctivae and EOM are normal. Pupils are equal, round, and reactive to light.  Corrective lenses  Neck: No JVD present. No tracheal deviation present. No thyromegaly present.  Cardiovascular: Normal rate, regular rhythm, normal heart sounds and intact distal pulses.  Exam reveals no gallop and no friction rub.   No murmur heard.  Pulmonary/Chest: Effort normal and breath sounds normal. No respiratory distress. She has no wheezes. She has no rales. She exhibits no tenderness.  Abdominal: She exhibits no distension and no mass.  Musculoskeletal: Normal range of motion. She exhibits edema. She exhibits no tenderness.  Neurological: She is alert and oriented to person, place, and time. She has normal reflexes. No cranial nerve deficit. Coordination normal.  Skin: No erythema. No pallor.  Lesions on the right and  left triceps area.  Psychiatric: She has a normal mood and affect. Her behavior is normal. Judgment and thought content normal.     Appointment on 06/15/2013  Component Date Value Range Status  . TSH 06/15/2013 2.380  0.450 - 4.500 uIU/mL Final  . Cholesterol, Total 06/15/2013 181  100 - 199 mg/dL Final  . Triglycerides 06/15/2013 66  0 - 149 mg/dL Final  . HDL 16/06/9603 86  >39 mg/dL Final   Comment: According to ATP-III Guidelines, HDL-C >59 mg/dL is considered a                          negative risk factor for CHD.  Marland Kitchen VLDL Cholesterol Cal 06/15/2013 13  5 - 40 mg/dL Final  . LDL Calculated 06/15/2013 82  0 - 99 mg/dL Final  . Chol/HDL Ratio 06/15/2013 2.1  0.0 - 4.4 ratio units Final   Comment:                                   T. Chol/HDL Ratio                                                                      Men  Women                                                        1/2 Avg.Risk  3.4    3.3                                                            Avg.Risk  5.0    4.4                                                         2X Avg.Risk  9.6    7.1                                                         3X Avg.Risk  23.4   11.0  . Glucose 06/15/2013 107* 65 - 99 mg/dL Final  . BUN 82/95/6213 13  8 - 27 mg/dL Final  . Creatinine, Ser 06/15/2013 0.71  0.57 - 1.00 mg/dL Final  . GFR calc non Af Amer 06/15/2013 77  >59 mL/min/1.73 Final  . GFR calc Af Amer 06/15/2013 89  >59 mL/min/1.73 Final  . BUN/Creatinine Ratio 06/15/2013 18  11 - 26 Final  . Sodium 06/15/2013 144  134 - 144 mmol/L Final  . Potassium 06/15/2013 4.2  3.5 - 5.2 mmol/L Final  . Chloride 06/15/2013 104  97 - 108 mmol/L Final  . CO2 06/15/2013 27  18 - 29 mmol/L Final  . Calcium 06/15/2013 9.3  8.6 - 10.2 mg/dL Final  . Total Protein 06/15/2013 6.4  6.0 - 8.5 g/dL Final  . Albumin 08/65/7846 4.3  3.5 - 4.7 g/dL Final  . Globulin, Total 06/15/2013 2.1  1.5 - 4.5 g/dL Final  . Albumin/Globulin Ratio  06/15/2013 2.0  1.1 - 2.5 Final  . Total Bilirubin 06/15/2013 0.5  0.0 - 1.2 mg/dL Final  . Alkaline Phosphatase 06/15/2013 47  39 - 117 IU/L Final  . AST 06/15/2013 20  0 - 40 IU/L Final  . 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

## 2013-06-17 NOTE — Patient Instructions (Signed)
Continue current medications. 

## 2013-07-13 DIAGNOSIS — M5030 Other cervical disc degeneration, unspecified cervical region: Secondary | ICD-10-CM | POA: Diagnosis not present

## 2013-07-13 DIAGNOSIS — M5137 Other intervertebral disc degeneration, lumbosacral region: Secondary | ICD-10-CM | POA: Diagnosis not present

## 2013-07-13 DIAGNOSIS — M999 Biomechanical lesion, unspecified: Secondary | ICD-10-CM | POA: Diagnosis not present

## 2013-07-13 DIAGNOSIS — M9981 Other biomechanical lesions of cervical region: Secondary | ICD-10-CM | POA: Diagnosis not present

## 2013-08-10 DIAGNOSIS — M5030 Other cervical disc degeneration, unspecified cervical region: Secondary | ICD-10-CM | POA: Diagnosis not present

## 2013-08-10 DIAGNOSIS — M5137 Other intervertebral disc degeneration, lumbosacral region: Secondary | ICD-10-CM | POA: Diagnosis not present

## 2013-08-10 DIAGNOSIS — M9981 Other biomechanical lesions of cervical region: Secondary | ICD-10-CM | POA: Diagnosis not present

## 2013-08-10 DIAGNOSIS — M999 Biomechanical lesion, unspecified: Secondary | ICD-10-CM | POA: Diagnosis not present

## 2013-08-17 DIAGNOSIS — L570 Actinic keratosis: Secondary | ICD-10-CM | POA: Diagnosis not present

## 2013-08-17 DIAGNOSIS — Z85828 Personal history of other malignant neoplasm of skin: Secondary | ICD-10-CM | POA: Diagnosis not present

## 2013-09-08 ENCOUNTER — Other Ambulatory Visit: Payer: Self-pay | Admitting: Internal Medicine

## 2013-09-14 DIAGNOSIS — M5137 Other intervertebral disc degeneration, lumbosacral region: Secondary | ICD-10-CM | POA: Diagnosis not present

## 2013-09-14 DIAGNOSIS — M5030 Other cervical disc degeneration, unspecified cervical region: Secondary | ICD-10-CM | POA: Diagnosis not present

## 2013-09-14 DIAGNOSIS — M9981 Other biomechanical lesions of cervical region: Secondary | ICD-10-CM | POA: Diagnosis not present

## 2013-09-14 DIAGNOSIS — M999 Biomechanical lesion, unspecified: Secondary | ICD-10-CM | POA: Diagnosis not present

## 2013-09-29 DIAGNOSIS — H40019 Open angle with borderline findings, low risk, unspecified eye: Secondary | ICD-10-CM | POA: Diagnosis not present

## 2013-09-29 DIAGNOSIS — H01009 Unspecified blepharitis unspecified eye, unspecified eyelid: Secondary | ICD-10-CM | POA: Diagnosis not present

## 2013-10-05 ENCOUNTER — Other Ambulatory Visit: Payer: Self-pay | Admitting: Internal Medicine

## 2013-10-13 DIAGNOSIS — M999 Biomechanical lesion, unspecified: Secondary | ICD-10-CM | POA: Diagnosis not present

## 2013-10-13 DIAGNOSIS — M9981 Other biomechanical lesions of cervical region: Secondary | ICD-10-CM | POA: Diagnosis not present

## 2013-10-13 DIAGNOSIS — M5030 Other cervical disc degeneration, unspecified cervical region: Secondary | ICD-10-CM | POA: Diagnosis not present

## 2013-10-13 DIAGNOSIS — M5137 Other intervertebral disc degeneration, lumbosacral region: Secondary | ICD-10-CM | POA: Diagnosis not present

## 2013-10-21 DIAGNOSIS — Z1231 Encounter for screening mammogram for malignant neoplasm of breast: Secondary | ICD-10-CM | POA: Diagnosis not present

## 2013-11-02 DIAGNOSIS — H40019 Open angle with borderline findings, low risk, unspecified eye: Secondary | ICD-10-CM | POA: Diagnosis not present

## 2013-11-16 DIAGNOSIS — M999 Biomechanical lesion, unspecified: Secondary | ICD-10-CM | POA: Diagnosis not present

## 2013-11-16 DIAGNOSIS — M9981 Other biomechanical lesions of cervical region: Secondary | ICD-10-CM | POA: Diagnosis not present

## 2013-11-16 DIAGNOSIS — M5137 Other intervertebral disc degeneration, lumbosacral region: Secondary | ICD-10-CM | POA: Diagnosis not present

## 2013-11-16 DIAGNOSIS — M5030 Other cervical disc degeneration, unspecified cervical region: Secondary | ICD-10-CM | POA: Diagnosis not present

## 2013-12-14 ENCOUNTER — Other Ambulatory Visit: Payer: Medicare Other

## 2013-12-14 DIAGNOSIS — E039 Hypothyroidism, unspecified: Secondary | ICD-10-CM

## 2013-12-14 DIAGNOSIS — R739 Hyperglycemia, unspecified: Secondary | ICD-10-CM

## 2013-12-14 DIAGNOSIS — R7309 Other abnormal glucose: Secondary | ICD-10-CM | POA: Diagnosis not present

## 2013-12-15 LAB — BASIC METABOLIC PANEL
BUN/Creatinine Ratio: 19 (ref 11–26)
BUN: 14 mg/dL (ref 8–27)
CALCIUM: 9.5 mg/dL (ref 8.7–10.3)
CO2: 25 mmol/L (ref 18–29)
CREATININE: 0.74 mg/dL (ref 0.57–1.00)
Chloride: 104 mmol/L (ref 97–108)
GFR calc Af Amer: 84 mL/min/{1.73_m2} (ref >59)
GFR calc non Af Amer: 73 mL/min/{1.73_m2} (ref >59)
GLUCOSE: 105 mg/dL — AB (ref 65–99)
Potassium: 4.1 mmol/L (ref 3.5–5.2)
SODIUM: 144 mmol/L (ref 134–144)

## 2013-12-15 LAB — TSH: TSH: 2.57 u[IU]/mL (ref 0.450–4.500)

## 2013-12-16 ENCOUNTER — Ambulatory Visit (INDEPENDENT_AMBULATORY_CARE_PROVIDER_SITE_OTHER): Payer: Medicare Other | Admitting: Internal Medicine

## 2013-12-16 ENCOUNTER — Encounter: Payer: Self-pay | Admitting: Internal Medicine

## 2013-12-16 VITALS — BP 120/58 | HR 78 | Temp 98.5°F | Resp 20 | Ht 63.0 in | Wt 180.4 lb

## 2013-12-16 DIAGNOSIS — I10 Essential (primary) hypertension: Secondary | ICD-10-CM | POA: Diagnosis not present

## 2013-12-16 DIAGNOSIS — M5137 Other intervertebral disc degeneration, lumbosacral region: Secondary | ICD-10-CM | POA: Diagnosis not present

## 2013-12-16 DIAGNOSIS — K224 Dyskinesia of esophagus: Secondary | ICD-10-CM | POA: Diagnosis not present

## 2013-12-16 DIAGNOSIS — E785 Hyperlipidemia, unspecified: Secondary | ICD-10-CM

## 2013-12-16 DIAGNOSIS — K219 Gastro-esophageal reflux disease without esophagitis: Secondary | ICD-10-CM | POA: Diagnosis not present

## 2013-12-16 DIAGNOSIS — R7309 Other abnormal glucose: Secondary | ICD-10-CM

## 2013-12-16 DIAGNOSIS — E039 Hypothyroidism, unspecified: Secondary | ICD-10-CM

## 2013-12-16 DIAGNOSIS — M999 Biomechanical lesion, unspecified: Secondary | ICD-10-CM | POA: Diagnosis not present

## 2013-12-16 DIAGNOSIS — F411 Generalized anxiety disorder: Secondary | ICD-10-CM | POA: Diagnosis not present

## 2013-12-16 DIAGNOSIS — I2510 Atherosclerotic heart disease of native coronary artery without angina pectoris: Secondary | ICD-10-CM | POA: Diagnosis not present

## 2013-12-16 DIAGNOSIS — L84 Corns and callosities: Secondary | ICD-10-CM | POA: Insufficient documentation

## 2013-12-16 DIAGNOSIS — C241 Malignant neoplasm of ampulla of Vater: Secondary | ICD-10-CM

## 2013-12-16 DIAGNOSIS — M5030 Other cervical disc degeneration, unspecified cervical region: Secondary | ICD-10-CM | POA: Diagnosis not present

## 2013-12-16 DIAGNOSIS — M9981 Other biomechanical lesions of cervical region: Secondary | ICD-10-CM | POA: Diagnosis not present

## 2013-12-16 DIAGNOSIS — R739 Hyperglycemia, unspecified: Secondary | ICD-10-CM

## 2013-12-16 MED ORDER — LEVOTHYROXINE SODIUM 50 MCG PO TABS: ORAL_TABLET | ORAL | Status: DC

## 2013-12-16 MED ORDER — ATENOLOL 50 MG PO TABS: ORAL_TABLET | ORAL | Status: DC

## 2013-12-16 MED ORDER — OMEPRAZOLE 20 MG PO CPDR: DELAYED_RELEASE_CAPSULE | ORAL | Status: DC

## 2013-12-16 MED ORDER — SIMVASTATIN 40 MG PO TABS: ORAL_TABLET | ORAL | Status: DC

## 2013-12-16 NOTE — Progress Notes (Signed)
Patient ID: Tonya Russell, female   DOB: Apr 20, 1925, 80 y.o.   MRN: [id number]    Location:  [facility]   Place of Service: OFFICE    Allergies  Allergen Reactions  . Codeine   . Lodine [Etodolac]   . Penicillins     Chief Complaint  Patient presents with  . Follow-up    lab results    HPI:   Diagnosed with glaucoma by Dr. [name]  Unspecified hypothyroidism: compensated  ESOPHAGEAL MOTILITY DISORDER: occ. Food sticking  GERD: asymptomatic  Hyperlipidemia: no recent lab  Hyperglycemia: 105 on recent lab  Hypertension: controlled  Malignant neoplasm of ampulla of Vater: in remission  Corn: ball left great toe.     Medications: Patient's Medications  New Prescriptions   No medications on file  Previous Medications   ALPRAZOLAM (XANAX) 0.5 MG TABLET    Take one tablet up to three times daily for anxiety or rest as needed   ASPIRIN 81 MG TABLET    Take one tablet once daily to prevent stroke   ATENOLOL (TENORMIN) 50 MG TABLET    TAKE 1 TABLET DAILY TO CONTROL BLOOD PRESSURE   BIMATOPROST (LUMIGAN) 0.01 % SOLN    Place 1 drop into both eyes at bedtime.   CALCIUM-MAGNESIUM-VITAMIN D [phone] MG-MG-UNIT TABS    Take one tablet once daily for supplement   GLUCOSAMINE-CHONDROITIN 500-400 MG TABLET    Take one tablet three times daily   INULIN (FIBER CHOICE PO)    Take 5 g by mouth 2 (two) times daily.   LEVOTHYROXINE (SYNTHROID, LEVOTHROID) 50 MCG TABLET    TAKE 1 TABLET DAILY FOR THYROID SUPPLEMENT   OMEGA-3 FATTY ACIDS (FISH OIL) 1200 MG CAPS    Take one tablet once daily for cholesterol   OMEPRAZOLE (PRILOSEC) 20 MG CAPSULE    TAKE 1 CAPSULE EVERY DAY  TO  REDUCE  STOMACH  ACID   SIMVASTATIN (ZOCOR) 40 MG TABLET    TAKE 1 TABLET DAILY TO LOWER CHOLESTEROL  Modified Medications   No medications on file  Discontinued Medications   No medications on file     Review of Systems  Constitutional: Negative for fever, chills, activity change, appetite change,  fatigue and unexpected weight change.  HENT: Negative for congestion, dental problem and trouble swallowing.   Eyes: Positive for visual disturbance.  Respiratory: Positive for apnea. Negative for choking, chest tightness and shortness of breath.   Cardiovascular: Negative for chest pain, palpitations and leg swelling.  Gastrointestinal: Positive for constipation. Negative for abdominal pain and abdominal distention.       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 S/P endoscopic surgery. Mild dysphagia and hx of esophageal dysmotility.  Endocrine: Negative.        Hyperglycemia.  Genitourinary: Negative.   Musculoskeletal: Positive for arthralgias. Negative for back pain, neck pain and neck stiffness.  Skin:       Residual lump at the left triceps where she had a SCC removed by Dr. Allyson Sabal 2014. Residual AK of right triceps.  Allergic/Immunologic: Negative.   Neurological: Negative for dizziness, facial asymmetry and headaches.  Psychiatric/Behavioral: Positive for agitation. Negative for behavioral problems and confusion.    Filed Vitals:   12/16/13 1124  BP: 120/58  Pulse: 78  Temp: 98.5 F (36.9 C)  TempSrc: Oral  Resp: 20  Height: 5\' 3"  (1.6 m)  Weight: 180 lb 6.4 oz (81.829 kg)  SpO2: 97%   Physical Exam  Constitutional: She is oriented to  person, place, and time. She appears well-developed and well-nourished. She appears distressed.  HENT:  Head: Normocephalic and atraumatic.  Right Ear: External ear normal.  Left Ear: External ear normal.  Nose: Nose normal.  Mouth/Throat: Oropharynx is clear and moist.  Eyes: Conjunctivae and EOM are normal. Pupils are equal, round, and reactive to light.  Corrective lenses  Neck: No JVD present. No tracheal deviation present. No thyromegaly present.  Cardiovascular: Normal rate, regular rhythm, normal heart sounds and intact distal pulses.  Exam reveals no gallop and no friction rub.   No murmur  heard. Pulmonary/Chest: Effort normal and breath sounds normal. No respiratory distress. She has no wheezes. She has no rales. She exhibits no tenderness.  Abdominal: She exhibits no distension and no mass. There is no tenderness.  Musculoskeletal: Normal range of motion. She exhibits edema. She exhibits no tenderness.  Neurological: She is alert and oriented to person, place, and time. She has normal reflexes. No cranial nerve deficit. Coordination normal.  Skin: No erythema. No pallor.  Lesions on the right and left triceps area. Corn on the ball of the left great toe. Ulcer under when debrided sharply today. Small amount of bleeding cauterized with silver nitrate.  Psychiatric: She has a normal mood and affect. Her behavior is normal. Judgment and thought content normal.     Labs reviewed: Appointment on 12/14/2013  Component Date Value Ref Range Status  . TSH 12/14/2013 2.570  0.450 - 4.500 uIU/mL Final  . Glucose 12/14/2013 105* 65 - 99 mg/dL Final  . BUN 12/14/2013 14  8 - 27 mg/dL Final  . Creatinine, Ser 12/14/2013 0.74  0.57 - 1.00 mg/dL Final  . GFR calc non Af Amer 12/14/2013 73  >59 mL/min/1.73 Final  . GFR calc Af Amer 12/14/2013 84  >59 mL/min/1.73 Final  . BUN/Creatinine Ratio 12/14/2013 19  11 - 26 Final  . Sodium 12/14/2013 144  134 - 144 mmol/L Final  . Potassium 12/14/2013 4.1  3.5 - 5.2 mmol/L Final  . Chloride 12/14/2013 104  97 - 108 mmol/L Final  . CO2 12/14/2013 25  18 - 29 mmol/L Final  . Calcium 12/14/2013 9.5  8.7 - 10.3 mg/dL Final      Assessment/Plan  1. Unspecified hypothyroidism - TSH; Future - levothyroxine (SYNTHROID, LEVOTHROID) 50 MCG tablet; TAKE 1 TABLET DAILY FOR THYROID SUPPLEMENT  Dispense: 90 tablet; Refill: 3  2. ESOPHAGEAL MOTILITY DISORDER observe  3. GERD - omeprazole (PRILOSEC) 20 MG capsule; TAKE 1 CAPSULE EVERY DAY  TO  REDUCE  STOMACH  ACID  Dispense: 90 capsule; Refill: 3  4. Hyperlipidemia - Lipid panel; Future -  simvastatin (ZOCOR) 40 MG tablet; TAKE 1 TABLET DAILY TO LOWER CHOLESTEROL  Dispense: 90 tablet; Refill: 3  5. Hyperglycemia Control weight. Avoid sweets. - Hemoglobin A1c; Future - Comprehensive metabolic panel; Future - Microalbumin / creatinine urine ratio; Future  6. Hypertension controlled - atenolol (TENORMIN) 50 MG tablet; TAKE 1 TABLET DAILY TO CONTROL BLOOD PRESSURE  Dispense: 90 tablet; Refill: 3  7. Malignant neoplasm of ampulla of Vater In remission  8. Corn of foot Sharply debrided. Cauterized bleeding point with silver nitrate

## 2013-12-16 NOTE — Patient Instructions (Signed)
Continue current medications. 

## 2013-12-28 DIAGNOSIS — H40019 Open angle with borderline findings, low risk, unspecified eye: Secondary | ICD-10-CM | POA: Diagnosis not present

## 2014-01-04 ENCOUNTER — Encounter: Payer: Self-pay | Admitting: *Deleted

## 2014-01-13 DIAGNOSIS — M999 Biomechanical lesion, unspecified: Secondary | ICD-10-CM | POA: Diagnosis not present

## 2014-01-13 DIAGNOSIS — M5030 Other cervical disc degeneration, unspecified cervical region: Secondary | ICD-10-CM | POA: Diagnosis not present

## 2014-01-13 DIAGNOSIS — M5137 Other intervertebral disc degeneration, lumbosacral region: Secondary | ICD-10-CM | POA: Diagnosis not present

## 2014-01-13 DIAGNOSIS — M9981 Other biomechanical lesions of cervical region: Secondary | ICD-10-CM | POA: Diagnosis not present

## 2014-01-24 ENCOUNTER — Emergency Department (HOSPITAL_COMMUNITY): Payer: Medicare Other

## 2014-01-24 ENCOUNTER — Emergency Department (HOSPITAL_COMMUNITY)
Admission: EM | Admit: 2014-01-24 | Discharge: 2014-01-24 | Disposition: A | Discharge status: Home / Self Care | Payer: Medicare Other | Attending: Emergency Medicine | Admitting: Emergency Medicine

## 2014-01-24 ENCOUNTER — Encounter (HOSPITAL_COMMUNITY): Payer: Self-pay | Admitting: Emergency Medicine

## 2014-01-24 DIAGNOSIS — Z7982 Long term (current) use of aspirin: Secondary | ICD-10-CM | POA: Diagnosis not present

## 2014-01-24 DIAGNOSIS — Z872 Personal history of diseases of the skin and subcutaneous tissue: Secondary | ICD-10-CM | POA: Insufficient documentation

## 2014-01-24 DIAGNOSIS — M549 Dorsalgia, unspecified: Secondary | ICD-10-CM | POA: Diagnosis not present

## 2014-01-24 DIAGNOSIS — Z8781 Personal history of (healed) traumatic fracture: Secondary | ICD-10-CM | POA: Diagnosis not present

## 2014-01-24 DIAGNOSIS — R638 Other symptoms and signs concerning food and fluid intake: Secondary | ICD-10-CM | POA: Insufficient documentation

## 2014-01-24 DIAGNOSIS — J329 Chronic sinusitis, unspecified: Secondary | ICD-10-CM | POA: Insufficient documentation

## 2014-01-24 DIAGNOSIS — Z8742 Personal history of other diseases of the female genital tract: Secondary | ICD-10-CM | POA: Insufficient documentation

## 2014-01-24 DIAGNOSIS — M1990 Unspecified osteoarthritis, unspecified site: Secondary | ICD-10-CM | POA: Insufficient documentation

## 2014-01-24 DIAGNOSIS — J019 Acute sinusitis, unspecified: Secondary | ICD-10-CM | POA: Diagnosis not present

## 2014-01-24 DIAGNOSIS — Z79899 Other long term (current) drug therapy: Secondary | ICD-10-CM | POA: Diagnosis not present

## 2014-01-24 DIAGNOSIS — Z9089 Acquired absence of other organs: Secondary | ICD-10-CM | POA: Diagnosis not present

## 2014-01-24 DIAGNOSIS — I10 Essential (primary) hypertension: Secondary | ICD-10-CM | POA: Diagnosis not present

## 2014-01-24 DIAGNOSIS — K859 Acute pancreatitis without necrosis or infection, unspecified: Secondary | ICD-10-CM | POA: Diagnosis not present

## 2014-01-24 DIAGNOSIS — Z8509 Personal history of malignant neoplasm of other digestive organs: Secondary | ICD-10-CM | POA: Insufficient documentation

## 2014-01-24 DIAGNOSIS — R630 Anorexia: Secondary | ICD-10-CM | POA: Diagnosis not present

## 2014-01-24 DIAGNOSIS — N281 Cyst of kidney, acquired: Secondary | ICD-10-CM | POA: Diagnosis not present

## 2014-01-24 DIAGNOSIS — E785 Hyperlipidemia, unspecified: Secondary | ICD-10-CM | POA: Diagnosis not present

## 2014-01-24 DIAGNOSIS — Z880 Allergy status to penicillin: Secondary | ICD-10-CM | POA: Insufficient documentation

## 2014-01-24 DIAGNOSIS — D3500 Benign neoplasm of unspecified adrenal gland: Secondary | ICD-10-CM | POA: Diagnosis not present

## 2014-01-24 LAB — COMPREHENSIVE METABOLIC PANEL
ALT: 36 U/L — ABNORMAL HIGH (ref 0–35)
AST: 39 U/L — ABNORMAL HIGH (ref 0–37)
Albumin: 2.9 g/dL — ABNORMAL LOW (ref 3.5–5.2)
Alkaline Phosphatase: 90 U/L (ref 39–117)
BUN: 16 mg/dL (ref 6–23)
CO2: 25 mEq/L (ref 19–32)
CREATININE: 0.77 mg/dL (ref 0.50–1.10)
Calcium: 9.1 mg/dL (ref 8.4–10.5)
Chloride: 98 mEq/L (ref 96–112)
GFR, EST AFRICAN AMERICAN: 85 mL/min — AB (ref >90)
GFR, EST NON AFRICAN AMERICAN: 73 mL/min — AB (ref >90)
GLUCOSE: 115 mg/dL — AB (ref 70–99)
Potassium: 3.9 mEq/L (ref 3.7–5.3)
Sodium: 135 mEq/L — ABNORMAL LOW (ref 137–147)
Total Bilirubin: 0.6 mg/dL (ref 0.3–1.2)
Total Protein: 7.2 g/dL (ref 6.0–8.3)

## 2014-01-24 LAB — URINALYSIS, ROUTINE W REFLEX MICROSCOPIC
Glucose, UA: NEGATIVE mg/dL
Hgb urine dipstick: NEGATIVE
Ketones, ur: NEGATIVE mg/dL
Nitrite: NEGATIVE
PH: 5.5 (ref 5.0–8.0)
PROTEIN: 30 mg/dL — AB
Specific Gravity, Urine: 1.024 (ref 1.005–1.030)
Urobilinogen, UA: 1 mg/dL (ref 0.0–1.0)

## 2014-01-24 LAB — URINE MICROSCOPIC-ADD ON

## 2014-01-24 LAB — CBC
HCT: 34.4 % — ABNORMAL LOW (ref 36.0–46.0)
Hemoglobin: 11.6 g/dL — ABNORMAL LOW (ref 12.0–15.0)
MCH: 31.4 pg (ref 26.0–34.0)
MCHC: 33.7 g/dL (ref 30.0–36.0)
MCV: 93 fL (ref 78.0–100.0)
Platelets: 154 10*3/uL (ref 150–400)
RBC: 3.7 MIL/uL — AB (ref 3.87–5.11)
RDW: 12.2 % (ref 11.5–15.5)
WBC: 9.2 10*3/uL (ref 4.0–10.5)

## 2014-01-24 LAB — LIPASE, BLOOD: Lipase: 87 U/L — ABNORMAL HIGH (ref 11–59)

## 2014-01-24 LAB — TROPONIN I: Troponin I: <0.3 ng/mL (ref <0.30)

## 2014-01-24 MED ORDER — MORPHINE SULFATE 2 MG/ML IJ SOLN
2.0000 mg | Freq: Once | INTRAMUSCULAR | Status: AC
  Administered 2014-01-24: 2 mg via INTRAVENOUS
  Filled 2014-01-24: qty 1

## 2014-01-24 MED ORDER — DOXYCYCLINE HYCLATE 100 MG PO CAPS: 100.0000 mg | ORAL_CAPSULE | Freq: Two times a day (BID) | ORAL | Status: DC

## 2014-01-24 MED ORDER — IOHEXOL 300 MG/ML  SOLN
100.0000 mL | Freq: Once | INTRAMUSCULAR | Status: AC | PRN
  Administered 2014-01-24: 100 mL via INTRAVENOUS

## 2014-01-24 MED ORDER — HYDROCODONE-ACETAMINOPHEN 5-325 MG PO TABS: 1.0000 | ORAL_TABLET | ORAL | Status: DC | PRN

## 2014-01-24 MED ORDER — IOHEXOL 300 MG/ML  SOLN
50.0000 mL | Freq: Once | INTRAMUSCULAR | Status: AC | PRN
  Administered 2014-01-24: 50 mL via ORAL

## 2014-01-24 MED ORDER — SODIUM CHLORIDE 0.9 % IV BOLUS (SEPSIS)
1000.0000 mL | Freq: Once | INTRAVENOUS | Status: AC
  Administered 2014-01-24: 1000 mL via INTRAVENOUS

## 2014-01-24 MED ORDER — ONDANSETRON 8 MG PO TBDP: ORAL_TABLET | ORAL | Status: DC

## 2014-01-24 NOTE — ED Notes (Signed)
She c/o upper abd. Pain and lack of appetite since yesterday.  She denies fever/diarrhea/vomiting and is in no  Distress.  She tells me that in 1999 she had a "growth in my bile duct that they removed at [facility]; and that growth gave me pancreatitis and this kinda [sic] feels like that".

## 2014-01-24 NOTE — ED Provider Notes (Deleted)
Medical screening examination/treatment/procedure(s) were performed by non-physician practitioner and as supervising physician I was immediately available for consultation/collaboration.   EKG Interpretation   Date/Time:  Sunday [name] 24 2014 17:28:02 EDT Ventricular Rate:  68 PR Interval:  167 QRS Duration: 97 QT Interval:  398 QTC Calculation: 423 R Axis:   51 Text Interpretation:  Sinus rhythm No significant change was found  Confirmed by Eulis Foster  MD, Dustin Burrill [phone]) on 01/24/2014 6:02:20 PM       Richarda Blade, MD 01/24/14 2341

## 2014-01-24 NOTE — ED Provider Notes (Signed)
CSN: [id number]     Arrival date & time 01/24/14  1637 History   First MD Initiated Contact with Patient 01/24/14 1656     Chief Complaint  Patient presents with  . Abdominal Pain     (Consider location/radiation/quality/duration/timing/severity/associated sxs/prior Treatment) The history is provided by the patient and medical records. No language interpreter was used.    Tonya Russell is a 80 y.o. female  with a hx of HTN, glaucoma presents to the Emergency Department complaining of gradual, persistent, progressively worsening epigastric abd pain, RUQ abd pain and right sided back pain onset 3 days ago.  Associated symptoms include decreased appetite, abd distension for approx 5 weeks before the intense pain began.  Pt has been taking ibuprofen, last at 2am yesterday. Pt reports moderate relief with this.  Walking, palpation, tight clothing makes it worse. Pt reports fever to 103 at home yesterday.  Pt denies headache, neck pain, chest pain, SOB, N/V/D, weakness, dizziness, syncope, dysuria.  Hx of appendectomy and cholecystectomy.    Pt reports hx of common bile duct tumor removal in 1999.  Pt reports it was found because of a bout of pancreatitis.  Also hx of ampullary tumor resection.  Pt has not had another EGD since the resection. Pt was seeing Dr. Henrene Pastor here in [place].     Is also complaining of approximately one and a half weeks of sinus pressure, sinus headache and frontal facial tenderness.  She reports fever to 103 at home yesterday question pancreatitis versus sinusitis. Patient reports long-standing history of recurrent sinusitis.   Past Medical History  Diagnosis Date  . Pain in joint, pelvic region and thigh   . Other voice and resonance disorders   . Oral aphthae   . Torus fracture of fibula   . Abdominal pain, epigastric   . Palpitations   . Dysphagia, unspecified(787.20)   . Allergic rhinitis, cause unspecified   . Dermatophytosis of nail   . Internal hemorrhoids without  mention of complication   . Other abnormal blood chemistry   . Allergic rhinitis due to other allergen   . Esophageal reflux   . Unspecified constipation   . Unspecified hypothyroidism   . Hyperlipidemia   . Hypertension   . Senile osteoporosis   . Diverticulosis of colon (without mention of hemorrhage)   . Malignant neoplasm of ampulla of Vater   . Peripheral vascular disease, unspecified   . Osteoarthrosis, unspecified whether generalized or localized, unspecified site   . Diffuse cystic mastopathy   . Family history of malignant neoplasm of gastrointestinal tract    Past Surgical History  Procedure Laterality Date  . Appendectomy    . Cholecystectomy    . Tonsillectomy and adenoidectomy    . Cyst excision      Left upper buttock  . Cataract extraction, bilateral    . Resection of ampullary tumor    . Basal cell carcinoma excision      right humerus, right temple, left side of nose  . Squamous cell cancer      Right leg  . Basal cell carcinoma excision      chest, left cheek  . Keratosis      left arm  . Squamous cell cancer left triceps Left     Dr. Lorenza Cambridge   Family History  Problem Relation Age of Onset  . Cancer Mother     face  . Cancer Father 44    colon  . Cancer Sister    History  Substance Use Topics  . Smoking status: Never Smoker   . Smokeless tobacco: Not on file  . Alcohol Use: No   OB History   Grav Para Term Preterm Abortions TAB SAB Ect Mult Living                 Review of Systems  Constitutional: Positive for appetite change (decreased). Negative for fever, diaphoresis, fatigue and unexpected weight change.  HENT: Negative for mouth sores.   Eyes: Negative for visual disturbance.  Respiratory: Negative for cough, chest tightness, shortness of breath and wheezing.   Cardiovascular: Negative for chest pain.  Gastrointestinal: Positive for nausea and abdominal pain. Negative for vomiting, diarrhea and constipation.  Endocrine: Negative  for polydipsia, polyphagia and polyuria.  Genitourinary: Negative for dysuria, urgency, frequency and hematuria.  Musculoskeletal: Negative for back pain and neck stiffness.  Skin: Negative for rash.  Allergic/Immunologic: Negative for immunocompromised state.  Neurological: Negative for syncope, light-headedness and headaches.  Hematological: Does not bruise/bleed easily.  Psychiatric/Behavioral: Negative for sleep disturbance. The patient is not nervous/anxious.       Allergies  Codeine; Lodine; and Penicillins  Home Medications   Prior to Admission medications   Medication Sig Start Date End Date Taking? Authorizing Provider  atenolol (TENORMIN) 50 MG tablet Take 50 mg by mouth daily.   Yes Historical Provider, MD  bimatoprost (LUMIGAN) 0.01 % SOLN Place 1 drop into both eyes at bedtime.   Yes Historical Provider, MD  simvastatin (ZOCOR) 80 MG tablet Take 80 mg by mouth daily.   Yes Historical Provider, MD  ALPRAZolam Duanne Moron) 0.5 MG tablet Take one tablet up to three times daily for anxiety or rest as needed    Historical Provider, MD  aspirin 81 MG tablet Take one tablet once daily to prevent stroke    Historical Provider, MD  Calcium-Magnesium-Vitamin D 500-250-200 MG-MG-UNIT TABS Take one tablet once daily for supplement    Historical Provider, MD  glucosamine-chondroitin 500-400 MG tablet Take one tablet three times daily    Historical Provider, MD  Inulin (FIBER CHOICE PO) Take 5 g by mouth 2 (two) times daily.    Historical Provider, MD  Omega-3 Fatty Acids (FISH OIL) 1200 MG CAPS Take one tablet once daily for cholesterol    Historical Provider, MD   BP 128/43  Pulse 62  Temp(Src) 98.7 F (37.1 C) (Oral)  Resp 18  SpO2 95% Physical Exam  Nursing note and vitals reviewed. Constitutional: She is oriented to person, place, and time. She appears well-developed and well-nourished. No distress.  HENT:  Head: Normocephalic and atraumatic.  Right Ear: Tympanic membrane,  external ear and ear canal normal.  Left Ear: Tympanic membrane, external ear and ear canal normal.  Nose: Mucosal edema present. No epistaxis. Right sinus exhibits maxillary sinus tenderness and frontal sinus tenderness. Left sinus exhibits maxillary sinus tenderness and frontal sinus tenderness.  Mouth/Throat: Uvula is midline, oropharynx is clear and moist and mucous membranes are normal. Mucous membranes are not pale and not cyanotic. No oropharyngeal exudate, posterior oropharyngeal edema, posterior oropharyngeal erythema or tonsillar abscesses.  Frontal and maxillary sinus tenderness  Eyes: Conjunctivae are normal. Pupils are equal, round, and reactive to light. No scleral icterus.  Neck: Normal range of motion and full passive range of motion without pain.  Cardiovascular: Normal rate, regular rhythm, normal heart sounds and intact distal pulses.   No murmur heard. Pulmonary/Chest: Effort normal and breath sounds normal. No stridor. No respiratory distress. She has no wheezes.  Clear and equal breath sounds  Abdominal: Soft. Bowel sounds are normal. She exhibits no distension and no mass. There is tenderness in the right upper quadrant and epigastric area. There is guarding. There is no rebound and no CVA tenderness.    Epigastric and right upper quadrant abdominal pain with mild, voluntary guarding no rigidity, rebound or peritoneal signs Abdomen soft and nontender in all other quadrants No CVA tenderness  Musculoskeletal: Normal range of motion.  Lymphadenopathy:    She has no cervical adenopathy.  Neurological: She is alert and oriented to person, place, and time.  Skin: Skin is warm and dry. No rash noted. She is not diaphoretic. No erythema.  Psychiatric: She has a normal mood and affect.    ED Course  Procedures (including critical care time) Labs Review Labs Reviewed  CBC - Abnormal; Notable for the following:    RBC 3.70 (*)    Hemoglobin 11.6 (*)    HCT 34.4 (*)     All other components within normal limits  COMPREHENSIVE METABOLIC PANEL - Abnormal; Notable for the following:    Sodium 135 (*)    Glucose, Bld 115 (*)    Albumin 2.9 (*)    AST 39 (*)    ALT 36 (*)    GFR calc non Af Amer 73 (*)    GFR calc Af Amer 85 (*)    All other components within normal limits  LIPASE, BLOOD - Abnormal; Notable for the following:    Lipase 87 (*)    All other components within normal limits  URINALYSIS, ROUTINE W REFLEX MICROSCOPIC - Abnormal; Notable for the following:    Color, Urine AMBER (*)    Bilirubin Urine SMALL (*)    Protein, ur 30 (*)    Leukocytes, UA SMALL (*)    All other components within normal limits  TROPONIN I  URINE MICROSCOPIC-ADD ON    Imaging Review US Abdomen Complete  01/24/2014   CLINICAL DATA:  Right upper quadrant pain  EXAM: ULTRASOUND ABDOMEN COMPLETE  COMPARISON:  None.  FINDINGS: Gallbladder:  Surgically removed  Common bile duct:  Diameter: 5.8 mm  Liver:  No focal lesion identified. Within normal limits in parenchymal echogenicity.  IVC:  No abnormality visualized.  Pancreas:  There is a cluster of cysts in the region the pancreas measuring 3.7 x 3.7 cm  Spleen:  Size and appearance within normal limits.  Right Kidney:  Length: 11.2 cm. Echogenicity within normal limits. No mass or hydronephrosis visualized.  Left Kidney:  Length: 10.4 cm. Echogenicity within normal limits. No hydronephrosis visualized. There is a 1.4 x 1.4 x 1.2 cm cyst in the upper pole left kidney.  Abdominal aorta:  No aneurysm visualized.  Other findings:  None.  IMPRESSION: Status post prior cholecystectomy.  Questioned cluster cysts in pancreas. Further evaluation with abdomen pelvic CT is recommended.  Simple cyst in the upper pole left kidney.   Electronically Signed   By: Abelardo Diesel M.D.   On: 01/24/2014 19:46   Ct Abdomen Pelvis W Contrast  01/24/2014   CLINICAL DATA:  Abdominal pain  EXAM: CT ABDOMEN AND PELVIS WITH CONTRAST  TECHNIQUE:  Multidetector CT imaging of the abdomen and pelvis was performed using the standard protocol following bolus administration of intravenous contrast.  CONTRAST:  80mL OMNIPAQUE IOHEXOL 300 MG/ML SOLN, 175mL OMNIPAQUE IOHEXOL 300 MG/ML SOLN  COMPARISON:  US ABDOMEN COMPLETE dated 01/24/2014  FINDINGS: Lingular subsegmental atelectasis or scarring noted. Mitral valve calcification is present.  Multiple small hypodense lesions in the liver are primarily technically too small to characterize but statistically likely to be cysts. The largest of these measures 7 mm in diameter on image 24 of series 2.  Spleen unremarkable. 1.3 x 1.6 cm left adrenal mass, portal venous density 75 Hounsfield units, delayed density 40 Hounsfield units, relative washout 47% (compatible with adenoma).  There is an unusual appearance of clustered cystic lesions tracking along the pancreas including the pancreatic tail, body, head, and uncinate process. There is mild peripancreatic stranding especially in the vicinity of the uncinate process were there is a larger 4.3 x 3.2 cm fluid density lesion. There is a cluster of cystic lesions in the pancreatic head measuring 4.2 cm vertical by 4.8 cm transverse by 4.2 cm anterior-posterior.  The common bile duct measures up to 1.1 cm but tapers distally as shown on image 48 of series 4.  There is abnormal stranding in the mesentery specially adjacent to the uncinate process of the pancreas. Ill-defined and somewhat serpentine cystic lesions are present along the inferior margin of the pancreatic head and uncinate process, and these lesions may be complex or inflamed.  Bilateral renal cysts are present. Infrarenal abdominal aortic ectasia up to 2.8 cm. Uterus absent. Sigmoid diverticulosis noted.  There is degenerative arthropathy of both hips.  IMPRESSION: 1. Inflamed uncinate process of the pancreas, with multiple cystic lesions scattered throughout the pancreas but concentrated in the pancreatic head  and uncinate process. 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 Accordingly, given the scattered distribution of the cystic lesions throughout the parenchyma and the active inflammation currently, I favor this appearance as being due to chronic pancreatitis with numerous pseudocysts superimposed on acute pancreatitis involving the pancreatic head and uncinate process. Clearly given the appearance, the possibility of serous cystadenoma or intraductal papillary mucinous tumor is not easily excluded. If the patient has outside imaging which can be provided, we are happy to compare. Otherwise, I would suggest a short-term followup imaging in 2-3 months time by MRI in order to assess for any further changes. 2. Small left adrenal adenoma. 3. Mitral valve calcification. 4. Small hypodense lesions in the liver technically too small to characterize although statistically likely to be cysts. 5. Ancillary findings include bilateral renal cysts, infrarenal abdominal aortic ectasia, sigmoid diverticulosis, and degenerative arthropathy of both hips.   Electronically Signed   By: Sherryl Barters M.D.   On: 01/24/2014 21:23     EKG Interpretation   Date/Time:  Sunday [name] 24 2014 17:28:02 EDT Ventricular Rate:  68 PR Interval:  167 QRS Duration: 97 QT Interval:  398 QTC Calculation: 423 R Axis:   51 Text Interpretation:  Sinus rhythm No significant change was found  Confirmed by Eulis Foster  MD, ELLIOTT [phone]) on 01/24/2014 6:02:20 PM      MDM   Final diagnoses:  Sinusitis  Pancreatitis  Decreased appetite   Tonya Russell presents with history of cholecystectomy and common bile duct tumor in addition to history of pancreatitis. She reports that symptoms feel like her pancreatitis. She's had approximately 5 weeks of decreasing appetite with 3 days of acute epigastric pain. Patient  endorses nausea without vomiting.  She reports she is able to eat and drink small amounts without difficulty.    Concern for possible pancreatitis today. Will obtain labwork, give fluid bolus, pain control and reevaluate.  ECG nonischemic.  Patient  also with frontal and maxillary sinus tenderness to palpation. Patient reports symptoms similar to previous episodes of sinusitis and current episode greater than 10 days. We'll discharge home with doxycycline.    6:34PM No is evidence of urinary tract infection, CBC without leukocytosis and CMP with mild elevation in AST and ALT to 39 and 36 respectively. Elevated lipase 87.  Troponin negative.  8PM Ultrasound with possible cyst in the pancreas, recommend CT  9:56PM CT scan with acute on chronic pancreatitis. I personally reviewed the imaging tests through PACS system  I reviewed available ER/hospitalization records through the EMR  11:07 PM I discussed these findings with the patient and her daughter. Patient reports that she continues to be able to tolerate by mouth she has had no emesis here in the department. She reports that her pain is improved. She reports that she saw [facility] many years ago for this problem and that she is willing to followup this week with them. I requested that she return to the emergency department for high fevers, intractable vomiting or other concerning symptoms.  It has been determined that no acute conditions requiring further emergency intervention are present at this time. The patient/guardian have been advised of the diagnosis and plan. We have discussed signs and symptoms that warrant return to the ED, such as changes or worsening in symptoms.   Vital signs are stable at discharge.   BP 128/43  Pulse 62  Temp(Src) 98.7 F (37.1 C) (Oral)  Resp 18  SpO2 95%  Patient/guardian has voiced understanding and agreed to follow-up with the PCP or specialist.    The patient was discussed with  and seen by Dr. Eulis Foster who agrees with the treatment plan.       Tonya Soho Dariona Postma, PA-C 01/24/14 2313

## 2014-01-24 NOTE — ED Notes (Signed)
Walked Pt to bathroom and back.

## 2014-01-24 NOTE — ED Provider Notes (Signed)
  Face-to-face evaluation   History: ill for 3 weeks, now pain abdomen for 1 week, RUQ, gradually worse. No Vomiting or Diarrhea.  Physical exam: Elderly, frail. Abdomen- mild epigastric tenderness.  Medical screening examination/treatment/procedure(s) were conducted as a shared visit with non-physician practitioner(s) and myself.  I personally evaluated the patient during the encounter  Richarda Blade, MD 01/27/14 [phone]

## 2014-01-24 NOTE — Discharge Instructions (Signed)
1. Medications: doxycycline, vicodin, zofran, usual home medications 2. Treatment: rest, drink plenty of fluids,  3. Follow Up: Please followup with [facility] for further discussion and evaluation of your pancreatitis; with followup with primary care physician for further discussion of your sinusitis   Acute Pancreatitis Acute pancreatitis is a disease in which the pancreas becomes suddenly inflamed. The pancreas is a large gland located behind your stomach. The pancreas produces enzymes that help digest food. The pancreas also releases the hormones glucagon and insulin that help regulate blood sugar. Damage to the pancreas occurs when the digestive enzymes from the pancreas are activated and begin attacking the pancreas before being released into the intestine. Most acute attacks last a couple of days and can cause serious complications. Some people become dehydrated and develop low blood pressure. In severe cases, bleeding into the pancreas can lead to shock and can be life-threatening. The lungs, heart, and kidneys may fail. CAUSES  Pancreatitis can happen to anyone. In some cases, the cause is unknown. Most cases are caused by:  Alcohol abuse.  Gallstones. Other less common causes are:  Certain medicines.  Exposure to certain chemicals.  Infection.  Damage caused by an accident (trauma).  Abdominal surgery. SYMPTOMS   Pain in the upper abdomen that may radiate to the back.  Tenderness and swelling of the abdomen.  Nausea and vomiting. DIAGNOSIS  Your caregiver will perform a physical exam. Blood and stool tests may be done to confirm the diagnosis. Imaging tests may also be done, such as X-rays, CT scans, or an ultrasound of the abdomen. TREATMENT  Treatment usually requires a stay in the hospital. Treatment may include:  Pain medicine.  Fluid replacement through an intravenous line (IV).  Placing a tube in the stomach to remove stomach contents and control  vomiting.  Not eating for 3 or 4 days. This gives your pancreas a rest, because enzymes are not being produced that can cause further damage.  Antibiotic medicines if your condition is caused by an infection.  Surgery of the pancreas or gallbladder. HOME CARE INSTRUCTIONS   Follow the diet advised by your caregiver. This may involve avoiding alcohol and decreasing the amount of fat in your diet.  Eat smaller, more frequent meals. This reduces the amount of digestive juices the pancreas produces.  Drink enough fluids to keep your urine clear or pale yellow.  Only take over-the-counter or prescription medicines as directed by your caregiver.  Avoid drinking alcohol if it caused your condition.  Do not smoke.  Get plenty of rest.  Check your blood sugar at home as directed by your caregiver.  Keep all follow-up appointments as directed by your caregiver. SEEK MEDICAL CARE IF:   You do not recover as quickly as expected.  You develop new or worsening symptoms.  You have persistent pain, weakness, or nausea.  You recover and then have another episode of pain. SEEK IMMEDIATE MEDICAL CARE IF:   You are unable to eat or keep fluids down.  Your pain becomes severe.  You have a fever or persistent symptoms for more than 2 to 3 days.  You have a fever and your symptoms suddenly get worse.  Your skin or the white part of your eyes turn yellow (jaundice).  You develop vomiting.  You feel dizzy, or you faint.  Your blood sugar is high (over 300 mg/dL). MAKE SURE YOU:   Understand these instructions.  Will watch your condition.  Will get help right away if you  are not doing well or get worse. Document Released: 08/27/2005 Document Revised: 02/26/2012 Document Reviewed: 12/06/2011 [facility] Patient Information 2014 [facility].   Sinusitis Sinusitis is redness, soreness, and swelling (inflammation) of the paranasal sinuses. Paranasal sinuses are air pockets within  the bones of your face (beneath the eyes, the middle of the forehead, or above the eyes). In healthy paranasal sinuses, mucus is able to drain out, and air is able to circulate through them by way of your nose. However, when your paranasal sinuses are inflamed, mucus and air can become trapped. This can allow bacteria and other germs to grow and cause infection. Sinusitis can develop quickly and last only a short time (acute) or continue over a long period (chronic). Sinusitis that lasts for more than 12 weeks is considered chronic.  CAUSES  Causes of sinusitis include:  Allergies.  Structural abnormalities, such as displacement of the cartilage that separates your nostrils (deviated septum), which can decrease the air flow through your nose and sinuses and affect sinus drainage.  Functional abnormalities, such as when the small hairs (cilia) that line your sinuses and help remove mucus do not work properly or are not present. SYMPTOMS  Symptoms of acute and chronic sinusitis are the same. The primary symptoms are pain and pressure around the affected sinuses. Other symptoms include:  Upper toothache.  Earache.  Headache.  Bad breath.  Decreased sense of smell and taste.  A cough, which worsens when you are lying flat.  Fatigue.  Fever.  Thick drainage from your nose, which often is green and may contain pus (purulent).  Swelling and warmth over the affected sinuses. DIAGNOSIS  Your caregiver will perform a physical exam. During the exam, your caregiver may:  Look in your nose for signs of abnormal growths in your nostrils (nasal polyps).  Tap over the affected sinus to check for signs of infection.  View the inside of your sinuses (endoscopy) with a special imaging device with a light attached (endoscope), which is inserted into your sinuses. If your caregiver suspects that you have chronic sinusitis, one or more of the following tests may be recommended:  Allergy  tests.  Nasal culture A sample of mucus is taken from your nose and sent to a lab and screened for bacteria.  Nasal cytology A sample of mucus is taken from your nose and examined by your caregiver to determine if your sinusitis is related to an allergy. TREATMENT  Most cases of acute sinusitis are related to a viral infection and will resolve on their own within 10 days. Sometimes medicines are prescribed to help relieve symptoms (pain medicine, decongestants, nasal steroid sprays, or saline sprays).  However, for sinusitis related to a bacterial infection, your caregiver will prescribe antibiotic medicines. These are medicines that will help kill the bacteria causing the infection.  Rarely, sinusitis is caused by a fungal infection. In theses cases, your caregiver will prescribe antifungal medicine. For some cases of chronic sinusitis, surgery is needed. Generally, these are cases in which sinusitis recurs more than 3 times per year, despite other treatments. HOME CARE INSTRUCTIONS   Drink plenty of water. Water helps thin the mucus so your sinuses can drain more easily.  Use a humidifier.  Inhale steam 3 to 4 times a day (for example, sit in the bathroom with the shower running).  Apply a warm, moist washcloth to your face 3 to 4 times a day, or as directed by your caregiver.  Use saline nasal sprays to help  moisten and clean your sinuses.  Take over-the-counter or prescription medicines for pain, discomfort, or fever only as directed by your caregiver. SEEK IMMEDIATE MEDICAL CARE IF:  You have increasing pain or severe headaches.  You have nausea, vomiting, or drowsiness.  You have swelling around your face.  You have vision problems.  You have a stiff neck.  You have difficulty breathing. MAKE SURE YOU:   Understand these instructions.  Will watch your condition.  Will get help right away if you are not doing well or get worse. Document Released: 08/27/2005 Document  Revised: 11/19/2011 Document Reviewed: 09/11/2011 [facility] Patient Information 2014 [place], [place].

## 2014-01-25 ENCOUNTER — Telehealth: Payer: Self-pay | Admitting: Internal Medicine

## 2014-01-25 NOTE — Telephone Encounter (Signed)
Pt was seen in the ER yesterday and told to follow-up with GI. Pt was having abdominal pain, states the pain was like she had when she was diagnosed with pancreatitis. Pt scheduled to see Dr. Henrene Pastor tomorrow at 9:30am. Pt aware of appt.

## 2014-01-26 ENCOUNTER — Encounter: Payer: Self-pay | Admitting: Internal Medicine

## 2014-01-26 ENCOUNTER — Other Ambulatory Visit (INDEPENDENT_AMBULATORY_CARE_PROVIDER_SITE_OTHER): Payer: Medicare Other

## 2014-01-26 ENCOUNTER — Ambulatory Visit (INDEPENDENT_AMBULATORY_CARE_PROVIDER_SITE_OTHER): Payer: Medicare Other | Admitting: Internal Medicine

## 2014-01-26 VITALS — BP 128/60 | HR 76 | Ht 63.0 in | Wt 175.2 lb

## 2014-01-26 DIAGNOSIS — R1013 Epigastric pain: Secondary | ICD-10-CM

## 2014-01-26 DIAGNOSIS — K861 Other chronic pancreatitis: Secondary | ICD-10-CM

## 2014-01-26 DIAGNOSIS — R933 Abnormal findings on diagnostic imaging of other parts of digestive tract: Secondary | ICD-10-CM

## 2014-01-26 LAB — HEPATIC FUNCTION PANEL
ALBUMIN: 3.2 g/dL — AB (ref 3.5–5.2)
ALT: 72 U/L — ABNORMAL HIGH (ref 0–35)
AST: 73 U/L — AB (ref 0–37)
Alkaline Phosphatase: 100 U/L (ref 39–117)
Bilirubin, Direct: 0.2 mg/dL (ref 0.0–0.3)
TOTAL PROTEIN: 7.5 g/dL (ref 6.0–8.3)
Total Bilirubin: 0.9 mg/dL (ref 0.2–1.2)

## 2014-01-26 LAB — LIPASE: Lipase: 49 U/L (ref 11.0–59.0)

## 2014-01-26 MED ORDER — PANCRELIPASE (LIP-PROT-AMYL) 36000-114000 UNITS PO CPEP: ORAL_CAPSULE | ORAL | Status: DC

## 2014-01-26 NOTE — Patient Instructions (Signed)
Your physician has requested that you go to the basement for the following lab work before leaving today:  LFTs, Lipase  You have been given a sample bottle of Creon.  Take 2 capsules with each meal (6 total a day)  Make sure to follow a low fat diet  You have been scheduled for a follow up appointment with Dr. Henrene Pastor on 02/10/2014 at 9:45am.

## 2014-01-26 NOTE — Progress Notes (Signed)
HISTORY OF PRESENT ILLNESS:  Tonya Russell is a 80 y.o. 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 She has not been seen in 5 years. She presents today, with her sister, regarding a one-week history of abdominal discomfort. Has not been feeling well with decreased appetite over the past several weeks. Mild weight loss. Because of progressive upper abdominal pain she went to the emergency room 2 days ago. Laboratories revealed minimally elevated transaminases. Normal alkaline phosphatase and bilirubin. Mildly elevated lipase of 87. CBC unremarkable. Abdominal ultrasound revealed prior cholecystectomy as well as probable cystic change in the pancreas. Subsequent contrast-enhanced CT scan of the abdomen and pelvis revealed multiple cystic lesions scattered throughout the pancreas. Larger cyst measuring between 4 and 5 cm. Some inflammation around the uncinate process. No obvious biliary ductal dilation. She continues with similar symptoms. She describes it as an aching discomfort for the right upper cautery. She was prescribed doxycycline for upper respiratory sinusitis, Vicodin for pain (has not taken) and Zofran for nausea (has not taken). No other complaints. She continues on PPI for GERD  REVIEW OF SYSTEMS:  All non-GI ROS negative except for sinus and allergy trouble, anxiety, visual change  Past Medical History  Diagnosis Date  . Pain in joint, pelvic region and thigh   . Other voice and resonance disorders   . Oral aphthae   . Torus fracture of fibula   . Abdominal pain, epigastric   . Palpitations   . Dysphagia, unspecified(787.20)   . Allergic rhinitis, cause unspecified   . Dermatophytosis of nail   . Internal hemorrhoids without  mention of complication   . Other abnormal blood chemistry   . Allergic rhinitis due to other allergen   . Esophageal reflux   . Unspecified constipation   . Unspecified hypothyroidism   . Hyperlipidemia   . Hypertension   . Senile osteoporosis   . Diverticulosis of colon (without mention of hemorrhage)   . Malignant neoplasm of ampulla of Vater   . Peripheral vascular disease, unspecified   . Osteoarthrosis, unspecified whether generalized or localized, unspecified site   . Diffuse cystic mastopathy   . Family history of malignant neoplasm of gastrointestinal tract   . Schatzki's ring   . Hiatal hernia     Past Surgical History  Procedure Laterality Date  . Appendectomy    . Cholecystectomy    . Tonsillectomy and adenoidectomy    . Cyst excision      Left upper buttock  . Cataract extraction, bilateral    . Resection of ampullary tumor    . Basal cell carcinoma excision      right humerus, right temple, left side of nose  . Squamous cell cancer      Right leg  . Basal cell carcinoma excision      chest, left cheek  . Keratosis      left arm  . Squamous cell cancer left triceps Left     Dr. Lorenza Cambridge    Social History Tonya Russell  reports that she has never smoked. She has never used smokeless tobacco. She reports that she does not drink alcohol or use illicit drugs.  family history includes Cancer in her mother and sister; Cancer (age of onset: 39) in her father.  Allergies  Allergen Reactions  .  Codeine Nausea And Vomiting  . Lodine [Etodolac] Nausea And Vomiting  . Penicillins     unknown       PHYSICAL EXAMINATION: Vital signs: BP 128/60  Pulse 76  Ht 5\' 3"  (1.6 m)  Wt 175 lb 3.2 oz (79.47 kg)  BMI 31.04 kg/m2  Constitutional: generally well-appearing, no acute distress Psychiatric: alert and oriented x3, cooperative Eyes: extraocular movements intact, anicteric, conjunctiva pink Mouth: oral pharynx moist, no lesions Neck: supple no  lymphadenopathy Cardiovascular: heart regular rate and rhythm, no murmur Lungs: clear to auscultation bilaterally Abdomen: soft, obese, moderate epigastric tenderness present , nondistended, no obvious ascites, no peritoneal signs, normal bowel sounds, no organomegaly Rectal: Omitted Extremities: 1+ lower extremity edema bilaterally Skin: no lesions on visible extremities Neuro: No focal deficits. No asterixis.    ASSESSMENT:  #1. Epigastric and right upper quadrant pain secondary to pancreatitis. #2. Tremendous cystic changes throughout the pancreas. Presumed to be chronic given remote history of pancreatitis. Cannot exclude cystic neoplastic process #3. History of ampullary adenoma with resultant pancreatitis. Status post ampullectomy about 15 years ago. Subsequent ERCP at that time revealed no residual tumor. #4. Status post cholecystectomy #5. Multiple medical problems and advanced age   PLAN:  #1. Repeat liver tests and lipase today #2. Creon pancreatic enzyme supplements provided. Take 2 with meals #3. Low fat diet #4. Office followup in 2 weeks #5. Encouraged to use when necessary medications for symptoms of nausea or pain. She prefers Xanax which she has.

## 2014-01-27 ENCOUNTER — Encounter: Payer: Self-pay | Admitting: Internal Medicine

## 2014-02-05 ENCOUNTER — Emergency Department (INDEPENDENT_AMBULATORY_CARE_PROVIDER_SITE_OTHER)
Admission: EM | Admit: 2014-02-05 | Discharge: 2014-02-05 | Disposition: A | Discharge status: Home / Self Care | Payer: Medicare Other | Source: Home / Self Care | Attending: Family Medicine | Admitting: Family Medicine

## 2014-02-05 ENCOUNTER — Encounter (HOSPITAL_COMMUNITY): Payer: Self-pay | Admitting: Emergency Medicine

## 2014-02-05 DIAGNOSIS — W57XXXA Bitten or stung by nonvenomous insect and other nonvenomous arthropods, initial encounter: Secondary | ICD-10-CM | POA: Diagnosis not present

## 2014-02-05 DIAGNOSIS — T148 Other injury of unspecified body region: Secondary | ICD-10-CM

## 2014-02-05 MED ORDER — FLUTICASONE PROPIONATE 0.05 % EX CREA: TOPICAL_CREAM | Freq: Two times a day (BID) | CUTANEOUS | Status: DC

## 2014-02-05 NOTE — Discharge Instructions (Signed)
See dr Allyson Sabal as planned.

## 2014-02-05 NOTE — ED Provider Notes (Addendum)
CSN: [id number]     Arrival date & time 02/05/14  1155 History   None    Chief Complaint  Patient presents with  . Tick Removal   (Consider location/radiation/quality/duration/timing/severity/associated sxs/prior Treatment) Patient is a 80 y.o. female presenting with rash. The history is provided by the patient.  Rash Location:  Leg and shoulder/arm Shoulder/arm rash location:  R upper arm Leg rash location:  L lower leg Quality: painful and swelling   Severity:  Mild Onset quality:  Gradual Duration:  3 days Progression:  Unchanged Context: insect bite/sting   Relieved by:  None tried Ineffective treatments:  None tried Associated symptoms: no fever     Past Medical History  Diagnosis Date  . Pain in joint, pelvic region and thigh   . Other voice and resonance disorders   . Oral aphthae   . Torus fracture of fibula   . Abdominal pain, epigastric   . Palpitations   . Dysphagia, unspecified(787.20)   . Allergic rhinitis, cause unspecified   . Dermatophytosis of nail   . Internal hemorrhoids without mention of complication   . Other abnormal blood chemistry   . Allergic rhinitis due to other allergen   . Esophageal reflux   . Unspecified constipation   . Unspecified hypothyroidism   . Hyperlipidemia   . Hypertension   . Senile osteoporosis   . Diverticulosis of colon (without mention of hemorrhage)   . Malignant neoplasm of ampulla of Vater   . Peripheral vascular disease, unspecified   . Osteoarthrosis, unspecified whether generalized or localized, unspecified site   . Diffuse cystic mastopathy   . Family history of malignant neoplasm of gastrointestinal tract   . Schatzki's ring   . Hiatal hernia    Past Surgical History  Procedure Laterality Date  . Appendectomy    . Cholecystectomy    . Tonsillectomy and adenoidectomy    . Cyst excision      Left upper buttock  . Cataract extraction, bilateral    . Resection of ampullary tumor    . Basal cell carcinoma  excision      right humerus, right temple, left side of nose  . Squamous cell cancer      Right leg  . Basal cell carcinoma excision      chest, left cheek  . Keratosis      left arm  . Squamous cell cancer left triceps Left     Dr. Lorenza Cambridge   Family History  Problem Relation Age of Onset  . Cancer Mother     face  . Cancer Father 42    colon  . Cancer Sister    History  Substance Use Topics  . Smoking status: Never Smoker   . Smokeless tobacco: Never Used  . Alcohol Use: No   OB History   Grav Para Term Preterm Abortions TAB SAB Ect Mult Living                 Review of Systems  Constitutional: Negative.  Negative for fever.  Skin: Positive for rash.    Allergies  Codeine; Lodine; and Penicillins  Home Medications   Prior to Admission medications   Medication Sig Start Date End Date Taking? Authorizing Provider  ALPRAZolam Duanne Moron) 0.5 MG tablet Take 0.25 mg by mouth 3 (three) times daily as needed for anxiety.     Historical Provider, MD  aspirin 81 MG tablet Take one tablet once daily to prevent stroke    Historical Provider, MD  atenolol (TENORMIN) 50 MG tablet Take 50 mg by mouth daily.    Historical Provider, MD  bimatoprost (LUMIGAN) 0.01 % SOLN Place 1 drop into both eyes at bedtime.    Historical Provider, MD  Calcium-Magnesium-Vitamin D [phone] MG-MG-UNIT TABS Take one tablet once daily for supplement    Historical Provider, MD  doxycycline (VIBRAMYCIN) 100 MG capsule Take 1 capsule (100 mg total) by mouth 2 (two) times daily. 01/24/14   Hannah Muthersbaugh, PA-C  fluticasone (CUTIVATE) 0.05 % cream Apply topically 2 (two) times daily. 02/05/14   Billy Fischer, MD  fluticasone (FLONASE) 50 MCG/ACT nasal spray Place 1 spray into both nostrils daily.    Historical Provider, MD  GARLIC PO Take 1 capsule by mouth daily.    Historical Provider, MD  glucosamine-chondroitin 500-400 MG tablet Take 1 tablet by mouth 3 (three) times daily.     Historical Provider,  MD  HYDROcodone-acetaminophen (NORCO/VICODIN) 5-325 MG per tablet Take 1-2 tablets by mouth every 4 (four) hours as needed. 01/24/14   Hannah Muthersbaugh, PA-C  ibuprofen (ADVIL,MOTRIN) 200 MG tablet Take 200 mg by mouth every 6 (six) hours as needed for mild pain.    Historical Provider, MD  Inulin (FIBER CHOICE PO) Take 5 g by mouth 2 (two) times daily.    Historical Provider, MD  levothyroxine (SYNTHROID, LEVOTHROID) 50 MCG tablet Take 50 mcg by mouth daily before breakfast.    Historical Provider, MD  Multiple Vitamin (MULTIVITAMIN WITH MINERALS) TABS tablet Take 1 tablet by mouth daily.    Historical Provider, MD  Omega-3 Fatty Acids (FISH OIL) 1200 MG CAPS Take 1,200 mg by mouth 3 (three) times daily.     Historical Provider, MD  omeprazole (PRILOSEC) 20 MG capsule Take 20 mg by mouth daily.    Historical Provider, MD  ondansetron (ZOFRAN ODT) 8 MG disintegrating tablet 8mg  ODT q4 hours prn nausea 01/24/14   Hannah Muthersbaugh, PA-C  OVER THE COUNTER MEDICATION Take 1 tablet by mouth daily. BONE CARE VITAMIN    Historical Provider, MD  Pancrelipase, Lip-Prot-Amyl, 36000 UNITS CPEP Take 2 tablets with each meal 01/26/14   [name], MD  simvastatin (ZOCOR) 80 MG tablet Take 80 mg by mouth daily.    Historical Provider, MD   BP 144/88  Pulse 78  Temp(Src) 98.6 F (37 C) (Oral)  Resp 18  SpO2 100% Physical Exam  Nursing note and vitals reviewed. Constitutional: She is oriented to person, place, and time. She appears well-developed and well-nourished. No distress.  Neurological: She is alert and oriented to person, place, and time.  Skin: Skin is warm and dry. No rash noted. There is erythema.  2-40mm sl tender nodular lesions as noted c/w bcc lesions    ED Course  Procedures (including critical care time) Labs Review Labs Reviewed - No data to display  Imaging Review No results found.   MDM   1. Insect bite        Billy Fischer, MD 02/05/14 1234  Billy Fischer,  MD 02/07/14 1224

## 2014-02-05 NOTE — ED Notes (Signed)
Pt  Reports  Symptoms  Of      Tick bite      That  She  Noticed      sev days  Ago  And    It  Was  Removed        By  heseltf   She  Reports  The  Ticks  Were  Removed  By  Herself       One  Bite  In each  Leg           She  Also  Has  A  Small red  Lesion r  Upper  Arm        She  denys  Any  Fever or  Any bullseye  Rash

## 2014-02-09 DIAGNOSIS — C44621 Squamous cell carcinoma of skin of unspecified upper limb, including shoulder: Secondary | ICD-10-CM | POA: Diagnosis not present

## 2014-02-09 DIAGNOSIS — Z85828 Personal history of other malignant neoplasm of skin: Secondary | ICD-10-CM | POA: Diagnosis not present

## 2014-02-09 DIAGNOSIS — L570 Actinic keratosis: Secondary | ICD-10-CM | POA: Diagnosis not present

## 2014-02-10 ENCOUNTER — Ambulatory Visit (INDEPENDENT_AMBULATORY_CARE_PROVIDER_SITE_OTHER): Payer: Medicare Other | Admitting: Internal Medicine

## 2014-02-10 ENCOUNTER — Encounter: Payer: Self-pay | Admitting: Internal Medicine

## 2014-02-10 VITALS — BP 120/62 | HR 76 | Ht 63.0 in | Wt 170.0 lb

## 2014-02-10 DIAGNOSIS — K861 Other chronic pancreatitis: Secondary | ICD-10-CM

## 2014-02-10 DIAGNOSIS — R1013 Epigastric pain: Secondary | ICD-10-CM | POA: Diagnosis not present

## 2014-02-10 DIAGNOSIS — R933 Abnormal findings on diagnostic imaging of other parts of digestive tract: Secondary | ICD-10-CM | POA: Diagnosis not present

## 2014-02-10 NOTE — Progress Notes (Signed)
HISTORY OF PRESENT ILLNESS:  Tonya Russell is a 80 y.o. female with multiple medical problems as listed below. 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 She also has a history of GERD complicated by peptic stricture requiring esophageal dilation in 2010. Last colonoscopy in 2003 with diverticulosis. She was seen 2 weeks ago for abdominal pain. This after and emergency room visit. He was found to have a mildly elevated lipase as well as a grossly abnormal CT scan of the pancreas with cystic changes throughout. She was felt to have chronic pancreatitis. Repeat liver enzymes were minimally abnormal. No obstruction. Lipase normal. Provider with Creon samples which she took for one week. Did not notice that this helped. However, happy to report that she has had no discomfort over the last 4 days. She is accompanied by her friend. In retrospect, she does report chronic abdominal discomfort for quite some time. No steatorrhea. Some reluctance to eat when she has pain, as this seems to worsen symptoms.  REVIEW OF SYSTEMS:  All non-GI ROS negative upon review except for fatigue Past Medical History  Diagnosis Date  . Pain in joint, pelvic region and thigh   . Other voice and resonance disorders   . Oral aphthae   . Torus fracture of fibula   . Abdominal pain, epigastric   . Palpitations   . Dysphagia, unspecified(787.20)   . Allergic rhinitis, cause unspecified   . Dermatophytosis of nail   . Internal hemorrhoids without mention of complication   . Other abnormal blood chemistry   . Allergic rhinitis due to other allergen   . Esophageal reflux   . Unspecified constipation   . Unspecified hypothyroidism   . Hyperlipidemia   . Hypertension   . Senile osteoporosis   . Diverticulosis of colon (without mention of hemorrhage)   . Malignant neoplasm of ampulla of Vater   . Peripheral vascular  disease, unspecified   . Osteoarthrosis, unspecified whether generalized or localized, unspecified site   . Diffuse cystic mastopathy   . Family history of malignant neoplasm of gastrointestinal tract   . Schatzki's ring   . Hiatal hernia     Past Surgical History  Procedure Laterality Date  . Appendectomy    . Cholecystectomy    . Tonsillectomy and adenoidectomy    . Cyst excision      Left upper buttock  . Cataract extraction, bilateral    . Resection of ampullary tumor    . Basal cell carcinoma excision      right humerus, right temple, left side of nose  . Squamous cell cancer      Right leg  . Basal cell carcinoma excision      chest, left cheek  . Keratosis      left arm  . Squamous cell cancer left triceps Left     Dr. Lorenza Cambridge    Social History Tonya Russell  reports that she has never smoked. She has never used smokeless tobacco. She reports that she does not drink alcohol or use illicit drugs.  family history includes Cancer in her mother and sister; Cancer (age of onset: 6) in her father.  Allergies  Allergen Reactions  . Codeine Nausea And Vomiting  . Lodine [Etodolac] Nausea And Vomiting  . Penicillins     unknown       PHYSICAL EXAMINATION: Vital signs: BP 120/62  Pulse 76  Ht 5\' 3"  (1.6  m)  Wt 170 lb (77.111 kg)  BMI 30.12 kg/m2 General: Well-developed, well-nourished, no acute distress HEENT: Sclerae are anicteric, conjunctiva pink. Oral mucosa intact Lungs: Clear Heart: Regular Abdomen: soft, nontender, nondistended, no obvious ascites, no peritoneal signs, normal bowel sounds. No organomegaly. Extremities: No edema Psychiatric: alert and oriented x3. Cooperative   ASSESSMENT:  #1. Chronic pancreatitis with marked cystic changes throughout the pancreas #2. History of ampullary adenoma with resultant pancreatitis status post ampullectomy 15 years ago #3. Status post cholecystectomy #4. Multiple medical problems and advanced  age   PLAN:  #1. Low fat diet #2. Routine followup in 2 months. Sooner if needed

## 2014-02-10 NOTE — Patient Instructions (Signed)
Please follow up with Dr. Henrene Pastor in 2 months.  I have scheduled an appointment for 04/13/2014 at 10:45am.

## 2014-02-11 ENCOUNTER — Telehealth: Payer: Self-pay

## 2014-02-11 NOTE — Telephone Encounter (Signed)
Mailed completed and signed DMV form for handicapped sticker to patient.

## 2014-02-22 DIAGNOSIS — M5030 Other cervical disc degeneration, unspecified cervical region: Secondary | ICD-10-CM | POA: Diagnosis not present

## 2014-02-22 DIAGNOSIS — M5137 Other intervertebral disc degeneration, lumbosacral region: Secondary | ICD-10-CM | POA: Diagnosis not present

## 2014-02-22 DIAGNOSIS — M999 Biomechanical lesion, unspecified: Secondary | ICD-10-CM | POA: Diagnosis not present

## 2014-02-22 DIAGNOSIS — M9981 Other biomechanical lesions of cervical region: Secondary | ICD-10-CM | POA: Diagnosis not present

## 2014-03-22 DIAGNOSIS — M5137 Other intervertebral disc degeneration, lumbosacral region: Secondary | ICD-10-CM | POA: Diagnosis not present

## 2014-03-22 DIAGNOSIS — M5030 Other cervical disc degeneration, unspecified cervical region: Secondary | ICD-10-CM | POA: Diagnosis not present

## 2014-03-22 DIAGNOSIS — M9981 Other biomechanical lesions of cervical region: Secondary | ICD-10-CM | POA: Diagnosis not present

## 2014-03-22 DIAGNOSIS — M999 Biomechanical lesion, unspecified: Secondary | ICD-10-CM | POA: Diagnosis not present

## 2014-03-23 DIAGNOSIS — D485 Neoplasm of uncertain behavior of skin: Secondary | ICD-10-CM | POA: Diagnosis not present

## 2014-03-23 DIAGNOSIS — L570 Actinic keratosis: Secondary | ICD-10-CM | POA: Diagnosis not present

## 2014-03-23 DIAGNOSIS — Z85828 Personal history of other malignant neoplasm of skin: Secondary | ICD-10-CM | POA: Diagnosis not present

## 2014-04-13 ENCOUNTER — Ambulatory Visit (INDEPENDENT_AMBULATORY_CARE_PROVIDER_SITE_OTHER): Payer: Medicare Other | Admitting: Internal Medicine

## 2014-04-13 ENCOUNTER — Encounter: Payer: Self-pay | Admitting: Internal Medicine

## 2014-04-13 VITALS — BP 124/60 | HR 70 | Ht 63.0 in | Wt 169.0 lb

## 2014-04-13 DIAGNOSIS — K219 Gastro-esophageal reflux disease without esophagitis: Secondary | ICD-10-CM | POA: Diagnosis not present

## 2014-04-13 DIAGNOSIS — R1013 Epigastric pain: Secondary | ICD-10-CM | POA: Diagnosis not present

## 2014-04-13 DIAGNOSIS — K861 Other chronic pancreatitis: Secondary | ICD-10-CM | POA: Diagnosis not present

## 2014-04-13 DIAGNOSIS — R933 Abnormal findings on diagnostic imaging of other parts of digestive tract: Secondary | ICD-10-CM | POA: Diagnosis not present

## 2014-04-13 NOTE — Patient Instructions (Signed)
Please follow up with Dr. Perry in 3 months 

## 2014-04-13 NOTE — Progress Notes (Signed)
HISTORY OF PRESENT ILLNESS:  Tonya Russell is a 80 y.o. female with multiple medical problems as listed below. 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 Other GI problems include GERD complicated by peptic stricture requiring dilation (2010) and screening colonoscopy in 2003 with diverticulosis only. Patient was evaluated in May 2015 for abdominal pain secondary to acute on chronic pancreatitis with marked cystic changes throughout the pancreas. She was treated with pancreatic enzymes. She had been having weight loss. She was seen in followup 02/10/2014. At that time she was stable with less abdominal complaints. She was continued on low-fat diet and asked to followup at this time. She is accompanied by her friend. She tells me that she "is doing pretty good". She's had no abdominal pain. Still apprehensive about eating certain foods, but has had no further weight loss. No new complaints. She does mention a sensation of abdominal fullness when she yawns. She does have a number questions about pancreatic cancer.  REVIEW OF SYSTEMS:  All non-GI ROS negative except for sinus and allergy trouble, back pain, visual change, fatigue, itching, muscle cramps, sleeping problems, urinary leakage  Past Medical History  Diagnosis Date  . Pain in joint, pelvic region and thigh   . Other voice and resonance disorders   . Oral aphthae   . Torus fracture of fibula   . Abdominal pain, epigastric   . Palpitations   . Dysphagia, unspecified(787.20)   . Allergic rhinitis, cause unspecified   . Dermatophytosis of nail   . Internal hemorrhoids without mention of complication   . Other abnormal blood chemistry   . Allergic rhinitis due to other allergen   . Esophageal reflux   . Unspecified constipation   . Unspecified hypothyroidism   . Hyperlipidemia   . Hypertension   . Senile osteoporosis   .  Diverticulosis of colon (without mention of hemorrhage)   . Malignant neoplasm of ampulla of Vater   . Peripheral vascular disease, unspecified   . Osteoarthrosis, unspecified whether generalized or localized, unspecified site   . Diffuse cystic mastopathy   . Family history of malignant neoplasm of gastrointestinal tract   . Schatzki's ring   . Hiatal hernia     Past Surgical History  Procedure Laterality Date  . Appendectomy    . Cholecystectomy    . Tonsillectomy and adenoidectomy    . Cataract extraction, bilateral    . Resection of ampullary tumor    . Basal cell carcinoma excision      right humerus, right temple, left side of nose  . Squamous cell cancer      Right leg  . Basal cell carcinoma excision      chest, left cheek  . Keratosis      left arm  . Squamous cell cancer left triceps Left     Dr. Lorenza Cambridge    Social History Hennesy Sobalvarro  reports that she has never smoked. She has never used smokeless tobacco. She reports that she does not drink alcohol or use illicit drugs.  family history includes Breast cancer in her sister and another family member; Cancer in her mother; Colon cancer (age of onset: 13) in her father; Colon polyps in her sister; Heart disease in her brother. There is no history of Esophageal cancer, Pancreatic cancer, Kidney disease, or Liver disease.  Allergies  Allergen Reactions  . Codeine Nausea And Vomiting  . Lodine [Etodolac] Nausea  And Vomiting  . Penicillins     unknown       PHYSICAL EXAMINATION: Vital signs: BP 124/60  Pulse 70  Ht 5\' 3"  (1.6 m)  Wt 169 lb (76.658 kg)  BMI 29.94 kg/m2 General: Well-developed, well-nourished, no acute distress HEENT: Sclerae are anicteric, conjunctiva pink. Oral mucosa intact Lungs: Clear Heart: Regular Abdomen: soft, nontender, nondistended, no obvious ascites, no peritoneal signs, normal bowel sounds. No organomegaly. Extremities: No edema Psychiatric: alert and oriented x3.  Cooperative   ASSESSMENT:  #1. Chronic pancreatitis. Stable. No significant abdominal pain or change in weight since last visit #2. History of GERD complicated by peptic stricture #3. 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 Diverticulosis   PLAN:  #1. Continue low-fat diet #2. Routine office followup in 3 months #3. Continue omeprazole #4. Ongoing general medical care with PCP

## 2014-04-19 DIAGNOSIS — M5030 Other cervical disc degeneration, unspecified cervical region: Secondary | ICD-10-CM | POA: Diagnosis not present

## 2014-04-19 DIAGNOSIS — M5137 Other intervertebral disc degeneration, lumbosacral region: Secondary | ICD-10-CM | POA: Diagnosis not present

## 2014-04-19 DIAGNOSIS — M999 Biomechanical lesion, unspecified: Secondary | ICD-10-CM | POA: Diagnosis not present

## 2014-04-19 DIAGNOSIS — M9981 Other biomechanical lesions of cervical region: Secondary | ICD-10-CM | POA: Diagnosis not present

## 2014-04-30 ENCOUNTER — Encounter: Payer: Self-pay | Admitting: Internal Medicine

## 2014-05-11 DIAGNOSIS — L905 Scar conditions and fibrosis of skin: Secondary | ICD-10-CM | POA: Diagnosis not present

## 2014-05-11 DIAGNOSIS — L570 Actinic keratosis: Secondary | ICD-10-CM | POA: Diagnosis not present

## 2014-05-11 DIAGNOSIS — C44319 Basal cell carcinoma of skin of other parts of face: Secondary | ICD-10-CM | POA: Diagnosis not present

## 2014-06-14 DIAGNOSIS — H01001 Unspecified blepharitis right upper eyelid: Secondary | ICD-10-CM | POA: Diagnosis not present

## 2014-06-14 DIAGNOSIS — M9902 Segmental and somatic dysfunction of thoracic region: Secondary | ICD-10-CM | POA: Diagnosis not present

## 2014-06-14 DIAGNOSIS — M5032 Other cervical disc degeneration, mid-cervical region: Secondary | ICD-10-CM | POA: Diagnosis not present

## 2014-06-14 DIAGNOSIS — H01004 Unspecified blepharitis left upper eyelid: Secondary | ICD-10-CM | POA: Diagnosis not present

## 2014-06-14 DIAGNOSIS — H1859 Other hereditary corneal dystrophies: Secondary | ICD-10-CM | POA: Diagnosis not present

## 2014-06-14 DIAGNOSIS — S134XXD Sprain of ligaments of cervical spine, subsequent encounter: Secondary | ICD-10-CM | POA: Diagnosis not present

## 2014-06-14 DIAGNOSIS — M9901 Segmental and somatic dysfunction of cervical region: Secondary | ICD-10-CM | POA: Diagnosis not present

## 2014-06-14 DIAGNOSIS — H40013 Open angle with borderline findings, low risk, bilateral: Secondary | ICD-10-CM | POA: Diagnosis not present

## 2014-06-21 ENCOUNTER — Other Ambulatory Visit: Payer: Medicare Other

## 2014-06-21 DIAGNOSIS — E039 Hypothyroidism, unspecified: Secondary | ICD-10-CM | POA: Diagnosis not present

## 2014-06-21 DIAGNOSIS — R739 Hyperglycemia, unspecified: Secondary | ICD-10-CM

## 2014-06-21 DIAGNOSIS — E785 Hyperlipidemia, unspecified: Secondary | ICD-10-CM | POA: Diagnosis not present

## 2014-06-21 DIAGNOSIS — R7309 Other abnormal glucose: Secondary | ICD-10-CM | POA: Diagnosis not present

## 2014-06-22 LAB — COMPREHENSIVE METABOLIC PANEL
ALK PHOS: 59 IU/L (ref 39–117)
ALT: 18 IU/L (ref 0–32)
AST: 22 IU/L (ref 0–40)
Albumin/Globulin Ratio: 1.6 (ref 1.1–2.5)
Albumin: 4.2 g/dL (ref 3.5–4.7)
BILIRUBIN TOTAL: 0.6 mg/dL (ref 0.0–1.2)
BUN/Creatinine Ratio: 16 (ref 11–26)
BUN: 13 mg/dL (ref 8–27)
CHLORIDE: 102 mmol/L (ref 97–108)
CO2: 25 mmol/L (ref 18–29)
Calcium: 9.5 mg/dL (ref 8.7–10.3)
Creatinine, Ser: 0.81 mg/dL (ref 0.57–1.00)
GFR calc Af Amer: 76 mL/min/{1.73_m2} (ref >59)
GFR calc non Af Amer: 66 mL/min/{1.73_m2} (ref >59)
GLUCOSE: 111 mg/dL — AB (ref 65–99)
Globulin, Total: 2.7 g/dL (ref 1.5–4.5)
Potassium: 4 mmol/L (ref 3.5–5.2)
Sodium: 143 mmol/L (ref 134–144)
TOTAL PROTEIN: 6.9 g/dL (ref 6.0–8.5)

## 2014-06-22 LAB — TSH: TSH: 1.71 u[IU]/mL (ref 0.450–4.500)

## 2014-06-22 LAB — MICROALBUMIN / CREATININE URINE RATIO
CREATININE UR: 113.5 mg/dL (ref 15.0–278.0)
MICROALB/CREAT RATIO: 14.4 mg/g{creat} (ref 0.0–30.0)
MICROALBUM., U, RANDOM: 16.4 ug/mL (ref 0.0–17.0)

## 2014-06-22 LAB — LIPID PANEL
Chol/HDL Ratio: 2.5 ratio units (ref 0.0–4.4)
Cholesterol, Total: 194 mg/dL (ref 100–199)
HDL: 77 mg/dL (ref >39)
LDL Calculated: 96 mg/dL (ref 0–99)
Triglycerides: 105 mg/dL (ref 0–149)
VLDL Cholesterol Cal: 21 mg/dL (ref 5–40)

## 2014-06-22 LAB — HEMOGLOBIN A1C
Est. average glucose Bld gHb Est-mCnc: 120 mg/dL
Hgb A1c MFr Bld: 5.8 % — ABNORMAL HIGH (ref 4.8–5.6)

## 2014-06-23 ENCOUNTER — Encounter: Payer: Self-pay | Admitting: Internal Medicine

## 2014-06-23 ENCOUNTER — Ambulatory Visit (INDEPENDENT_AMBULATORY_CARE_PROVIDER_SITE_OTHER): Payer: Medicare Other | Admitting: Internal Medicine

## 2014-06-23 VITALS — BP 138/82 | HR 59 | Temp 97.7°F | Ht 63.0 in | Wt 168.4 lb

## 2014-06-23 DIAGNOSIS — E785 Hyperlipidemia, unspecified: Secondary | ICD-10-CM

## 2014-06-23 DIAGNOSIS — R739 Hyperglycemia, unspecified: Secondary | ICD-10-CM | POA: Diagnosis not present

## 2014-06-23 DIAGNOSIS — Z23 Encounter for immunization: Secondary | ICD-10-CM | POA: Diagnosis not present

## 2014-06-23 DIAGNOSIS — I2510 Atherosclerotic heart disease of native coronary artery without angina pectoris: Secondary | ICD-10-CM | POA: Diagnosis not present

## 2014-06-23 DIAGNOSIS — K861 Other chronic pancreatitis: Secondary | ICD-10-CM

## 2014-06-23 DIAGNOSIS — I10 Essential (primary) hypertension: Secondary | ICD-10-CM | POA: Diagnosis not present

## 2014-06-23 DIAGNOSIS — E669 Obesity, unspecified: Secondary | ICD-10-CM | POA: Diagnosis not present

## 2014-06-23 DIAGNOSIS — E039 Hypothyroidism, unspecified: Secondary | ICD-10-CM

## 2014-06-23 DIAGNOSIS — K648 Other hemorrhoids: Secondary | ICD-10-CM

## 2014-06-23 DIAGNOSIS — C241 Malignant neoplasm of ampulla of Vater: Secondary | ICD-10-CM | POA: Diagnosis not present

## 2014-06-23 MED ORDER — PRAVASTATIN SODIUM 20 MG PO TABS: ORAL_TABLET | ORAL | Status: DC

## 2014-06-23 NOTE — Progress Notes (Signed)
Patient ID: Tonya Russell, female   DOB: 08-11-1925, 80 y.o.   MRN: [id number]    HISTORY AND PHYSICAL  Location:    [facility]   Place of Service:   OFFICE  Extended Emergency Contact Information Primary Emergency Contact: Causey,Edna Address: [place] [address] [name] of [place] Phone: [phone] Relation: Friend Secondary Emergency Contact: [place], Gray [place] of [place] Phone: [phone] Relation: Sister   Advanced Directive information Does patient have an advance directive?: Yes, Type of Advance Directive: Living will, Does patient want to make changes to advanced directive?: No - Patient declined    Chief Complaint  Patient presents with  . Annual Exam    Annual Exam and patient complains of Red Eyes.    HPI:  RIGHT GREAT TOENAIL HAS A BLACK DISCOLORATION ON THE MEDIAL SIDE. PAINLESS. sHE SAYS IT HAS NOT BLED.  Right great toenail has a black discoloration on the medial side. Painless. She says it has not bled.  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 Henrene Pastor i August 2015: "Patient was evaluated in May 2015 for abdominal pain secondary to acute on chronic pancreatitis with marked cystic changes throughout the pancreas. She was treated with pancreatic enzymes. She had been having weight loss. She was seen in followup 02/10/2014. At that time she was stable with less abdominal complaints. She was continued on low-fat diet and asked to followup at this time. She is accompanied by her friend. She tells me that she "is doing pretty good". She's had no abdominal pain. Still apprehensive about eating certain foods, but has had no further weight loss. No new complaints. She does mention a  sensation of abdominal fullness when she yawns. She does have a number questions about pancreatic cancer" . He felt she is stable.  Mildly obese: continue to monitor weightt  Internal hemorrhoids: mild rectal itch and discomfort  Hypothyroidism, unspecified hypothyroidism type - Plan: TSH  Atherosclerosis of native coronary artery of native heart without angina pectoris: stable  Encounter for immunization    Past Medical History  Diagnosis Date  . Pain in joint, pelvic region and thigh   . Other voice and resonance disorders   . Oral aphthae   . Torus fracture of fibula   . Abdominal pain, epigastric   . Palpitations   . Dysphagia, unspecified(787.20)   . Allergic rhinitis, cause unspecified   . Dermatophytosis of nail   . Internal hemorrhoids without mention of complication   . Other abnormal blood chemistry   . Allergic rhinitis due to other allergen   . Esophageal reflux   . Unspecified constipation   . Unspecified hypothyroidism   . Hyperlipidemia   . Hypertension   . Senile osteoporosis   . Diverticulosis of colon (without mention of hemorrhage)   . Malignant neoplasm of ampulla of Vater   . Peripheral vascular disease, unspecified   . Osteoarthrosis, unspecified whether generalized or localized, unspecified site   . Diffuse cystic mastopathy   . Family history of malignant neoplasm of gastrointestinal tract   . Schatzki's ring   . Hiatal hernia     Past Surgical History  Procedure Laterality Date  . Appendectomy    .  Cholecystectomy    . Tonsillectomy and adenoidectomy    . Cataract extraction, bilateral    . Resection of ampullary tumor    . Basal cell carcinoma excision      right humerus, right temple, left side of nose  . Squamous cell cancer      Right leg  . Basal cell carcinoma excision      chest, left cheek  . Keratosis      left arm  . Squamous cell cancer left triceps Left     Dr. Lorenza Cambridge    Patient Care Team: Estill Dooms, MD as  PCP - General (Internal Medicine)  History   Social History  . Marital Status: Legally Separated    Spouse Name: N/A    Number of Children: N/A  . Years of Education: N/A   Occupational History  . Retired    Social History Main Topics  . Smoking status: Never Smoker   . Smokeless tobacco: Never Used  . Alcohol Use: No  . Drug Use: No  . Sexual Activity: Not on file   Other Topics Concern  . Not on file   Social History Narrative  . No narrative on file     reports that she has never smoked. She has never used smokeless tobacco. She reports that she does not drink alcohol or use illicit drugs.  Immunization History  Administered Date(s) Administered  . Influenza,inj,Quad PF,36+ Mos 06/17/2013, 06/23/2014  . Influenza-Unspecified 07/09/2012  . Pneumococcal-Unspecified 09/10/1992  . Tdap 09/11/2007  . Zoster 11/28/2010    Allergies  Allergen Reactions  . Codeine Nausea And Vomiting  . Lodine [Etodolac] Nausea And Vomiting  . Penicillins     unknown    Medications: Patient's Medications  New Prescriptions   No medications on file  Previous Medications   ALPRAZOLAM (XANAX) 0.5 MG TABLET    Take 0.25 mg by mouth 3 (three) times daily as needed for anxiety.    ASPIRIN 81 MG TABLET    Take one tablet once daily to prevent stroke   ATENOLOL (TENORMIN) 50 MG TABLET    Take 50 mg by mouth daily.   BIMATOPROST (LUMIGAN) 0.01 % SOLN    Place 1 drop into both eyes at bedtime.   CALCIUM-MAGNESIUM-VITAMIN D [phone] MG-MG-UNIT TABS    Take one tablet once daily for supplement   FLUTICASONE (FLONASE) 50 MCG/ACT NASAL SPRAY    Place 1 spray into both nostrils daily.   GARLIC PO    Take 1 capsule by mouth daily.   GLUCOSAMINE-CHONDROITIN 500-400 MG TABLET    Take 1 tablet by mouth 3 (three) times daily.    IBUPROFEN (ADVIL,MOTRIN) 200 MG TABLET    Take 200 mg by mouth every 6 (six) hours as needed for mild pain.   INULIN (FIBER CHOICE PO)    Take 5 g by mouth 2 (two) times  daily.   LEVOTHYROXINE (SYNTHROID, LEVOTHROID) 50 MCG TABLET    Take 50 mcg by mouth daily before breakfast.   MULTIPLE VITAMIN (MULTIVITAMIN WITH MINERALS) TABS TABLET    Take 1 tablet by mouth daily.   OMEGA-3 FATTY ACIDS (FISH OIL) 1200 MG CAPS    Take 1,200 mg by mouth 3 (three) times daily.    OMEPRAZOLE (PRILOSEC) 20 MG CAPSULE    Take 20 mg by mouth daily.   ONDANSETRON (ZOFRAN ODT) 8 MG DISINTEGRATING TABLET    8mg  ODT q4 hours prn nausea   OVER THE COUNTER MEDICATION  Take 1 tablet by mouth daily. BONE CARE VITAMIN   SIMVASTATIN (ZOCOR) 80 MG TABLET    Take 40 mg by mouth daily.   Modified Medications   No medications on file  Discontinued Medications   No medications on file     Review of Systems  Constitutional: Negative for fever, chills, activity change, appetite change, fatigue and unexpected weight change.  HENT: Negative for congestion, dental problem and trouble swallowing.   Eyes: Positive for visual disturbance.  Respiratory: Positive for apnea. Negative for choking, chest tightness and shortness of breath.   Cardiovascular: Negative for chest pain, palpitations and leg swelling.  Gastrointestinal: Positive for constipation. Negative for abdominal pain and abdominal distention.       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 S/P endoscopic surgery. Mild dysphagia and hx of esophageal dysmotility.  Endocrine: Negative.        Hyperglycemia.  Genitourinary: Negative.   Musculoskeletal: Positive for arthralgias. Negative for back pain, neck pain and neck stiffness.  Skin:       Residual lump at the left triceps where she had a SCC removed by Dr. Allyson Sabal 2014. Residual AK of right triceps.  Allergic/Immunologic: Negative.   Neurological: Negative for dizziness, facial asymmetry and headaches.  Psychiatric/Behavioral: Positive for agitation. Negative for behavioral problems and confusion.    Filed Vitals:   06/23/14 1239  BP: 138/82  Pulse: 59    Temp: 97.7 F (36.5 C)  TempSrc: Oral  Height: 5\' 3"  (1.6 m)  Weight: 168 lb 6.4 oz (76.386 kg)   Body mass index is 29.84 kg/(m^2).  Physical Exam  Constitutional: She is oriented to person, place, and time. She appears well-developed and well-nourished. She appears distressed.  HENT:  Head: Normocephalic and atraumatic.  Right Ear: External ear normal.  Left Ear: External ear normal.  Nose: Nose normal.  Mouth/Throat: Oropharynx is clear and moist.  Eyes: Conjunctivae and EOM are normal. Pupils are equal, round, and reactive to light.  Corrective lenses  Neck: No JVD present. No tracheal deviation present. No thyromegaly present.  Cardiovascular: Normal rate, regular rhythm, normal heart sounds and intact distal pulses.  Exam reveals no gallop and no friction rub.   No murmur heard. Pulmonary/Chest: Effort normal and breath sounds normal. No respiratory distress. She has no wheezes. She has no rales. She exhibits no tenderness.  Abdominal: She exhibits no distension and no mass. There is no tenderness.  Musculoskeletal: Normal range of motion. She exhibits edema. She exhibits no tenderness.  Neurological: She is alert and oriented to person, place, and time. She has normal reflexes. No cranial nerve deficit. Coordination normal.  Skin: No erythema. No pallor.  Lesions on the right and left triceps area. Corn on the ball of the left great toe. Ulcer under when debrided sharply today. Small amount of bleeding cauterized with silver nitrate.  Psychiatric: She has a normal mood and affect. Her behavior is normal. Judgment and thought content normal.     Labs reviewed: Appointment on 06/21/2014  Component Date Value Ref Range Status  . Hemoglobin A1C 06/21/2014 5.8* 4.8 - 5.6 % Final   Comment:          Increased risk for diabetes: 5.7 - 6.4                                   Diabetes: >6.4  Glycemic control for adults with diabetes: <7.0  .  Estimated average glucose 06/21/2014 120   Final  . Glucose 06/21/2014 111* 65 - 99 mg/dL Final  . BUN 06/21/2014 13  8 - 27 mg/dL Final  . Creatinine, Ser 06/21/2014 0.81  0.57 - 1.00 mg/dL Final  . GFR calc non Af Amer 06/21/2014 66  >59 mL/min/1.73 Final  . GFR calc Af Amer 06/21/2014 76  >59 mL/min/1.73 Final  . BUN/Creatinine Ratio 06/21/2014 16  11 - 26 Final  . Sodium 06/21/2014 143  134 - 144 mmol/L Final  . Potassium 06/21/2014 4.0  3.5 - 5.2 mmol/L Final  . Chloride 06/21/2014 102  97 - 108 mmol/L Final  . CO2 06/21/2014 25  18 - 29 mmol/L Final  . Calcium 06/21/2014 9.5  8.7 - 10.3 mg/dL Final  . Total Protein 06/21/2014 6.9  6.0 - 8.5 g/dL Final  . Albumin 06/21/2014 4.2  3.5 - 4.7 g/dL Final  . Globulin, Total 06/21/2014 2.7  1.5 - 4.5 g/dL Final  . Albumin/Globulin Ratio 06/21/2014 1.6  1.1 - 2.5 Final  . Total Bilirubin 06/21/2014 0.6  0.0 - 1.2 mg/dL Final  . Alkaline Phosphatase 06/21/2014 59  39 - 117 IU/L Final  . AST 06/21/2014 22  0 - 40 IU/L Final  . ALT 06/21/2014 18  0 - 32 IU/L Final  . Cholesterol, Total 06/21/2014 194  100 - 199 mg/dL Final   Comment: **Effective July 05, 2014 the reference interval**                            for Cholesterol, Total will be changing to:                                                   0 - 19 years       100 - 169                                                      >19 years       100 - 199  . Triglycerides 06/21/2014 105  0 - 149 mg/dL Final   Comment: **Effective July 05, 2014 the reference interval**                            for Triglycerides will be changing to:                                                   0 -  9 years         0 -  74                                                  10 - 19 years         0 -  89                                                      >19 years         0 - 149  . HDL 06/21/2014 77  >39 mg/dL Final   Comment: According to ATP-III Guidelines, HDL-C >59 mg/dL is considered a                           negative risk factor for CHD.  Marland Kitchen VLDL Cholesterol Cal 06/21/2014 21  5 - 40 mg/dL Final  . LDL Calculated 06/21/2014 96  0 - 99 mg/dL Final   Comment: **Effective July 05, 2014 the reference interval**                            for LDL Cholesterol Calc will be changing to:                                                   0 - 19 years         0 - 109                                                      >19 years         0 - 99  . Chol/HDL Ratio 06/21/2014 2.5  0.0 - 4.4 ratio units Final   Comment:                                   T. Chol/HDL Ratio                                                                      Men  Women                                                        1/2 Avg.Risk  3.4    3.3                                                            Avg.Risk  5.0    4.4  2X Avg.Risk  9.6    7.1                                                         3X Avg.Risk 23.4   11.0  . Creatinine, Ur 06/21/2014 113.5  15.0 - 278.0 mg/dL Final  . Microalbum.,U,Random 06/21/2014 16.4  0.0 - 17.0 ug/mL Final  . MICROALB/CREAT RATIO 06/21/2014 14.4  0.0 - 30.0 mg/g creat Final  . TSH 06/21/2014 1.710  0.450 - 4.500 uIU/mL Final   01/24/14 CT abd and pelvis: 1. Inflamed uncinate process of the pancreas, with multiple cystic  lesions scattered throughout the pancreas but concentrated in the  pancreatic head and uncinate process. Upon review of the medical  record, the patient has a history of episodes of pancreatitis  secondary to an ampullary adenoma which was resected via endoscopic  ampulectomy. Accordingly, given the scattered distribution of the  cystic lesions throughout the parenchyma and the active inflammation  currently, I favor this appearance as being due to chronic  pancreatitis with numerous pseudocysts superimposed on acute  pancreatitis involving the pancreatic head and uncinate process.    Clearly given the appearance, the possibility of serous cystadenoma  or intraductal papillary mucinous tumor is not easily excluded. If  the patient has outside imaging which can be provided, we are happy  to compare. Otherwise, I would suggest a short-term followup imaging  in 2-3 months time by MRI in order to assess for any further  changes.  2. Small left adrenal adenoma.  3. Mitral valve calcification.  4. Small hypodense lesions in the liver technically too small to  characterize although statistically likely to be cysts.  5. Ancillary findings include bilateral renal cysts, infrarenal  abdominal aortic ectasia, sigmoid diverticulosis, and degenerative  arthropathy of both hips.  Assessment/Plan  1. Hyperglycemia controlled - Comprehensive metabolic panel; Future  2. Hyperlipidemia - pravastatin (PRAVACHOL) 20 MG tablet; One daily to lower cholesterol  Dispense: 90 tablet; Refill: 3 - Lipid panel; Future  3. Essential hypertension controlled - Comprehensive metabolic panel; Future  4. Malignant neoplasm of ampulla of Vater No relapse  5. Idiopathic chronic pancreatitis inactive  6. Mildly obese Continue to watch weight  7. Internal hemorrhoids yuse OTC meds as needed  8. Hypothyroidism, unspecified hypothyroidism type - TSH; Future  9. Atherosclerosis of native coronary artery of native heart without angina pectoris asymptomatic  10. Encounter for immunization Fluvax

## 2014-06-24 DIAGNOSIS — D485 Neoplasm of uncertain behavior of skin: Secondary | ICD-10-CM | POA: Diagnosis not present

## 2014-06-24 DIAGNOSIS — Z08 Encounter for follow-up examination after completed treatment for malignant neoplasm: Secondary | ICD-10-CM | POA: Diagnosis not present

## 2014-06-24 DIAGNOSIS — Z85828 Personal history of other malignant neoplasm of skin: Secondary | ICD-10-CM | POA: Diagnosis not present

## 2014-07-28 ENCOUNTER — Ambulatory Visit (INDEPENDENT_AMBULATORY_CARE_PROVIDER_SITE_OTHER): Payer: Medicare Other | Admitting: Internal Medicine

## 2014-07-28 ENCOUNTER — Encounter: Payer: Self-pay | Admitting: Internal Medicine

## 2014-07-28 VITALS — BP 120/60 | HR 60 | Ht 63.0 in | Wt 170.4 lb

## 2014-07-28 DIAGNOSIS — K861 Other chronic pancreatitis: Secondary | ICD-10-CM | POA: Diagnosis not present

## 2014-07-28 DIAGNOSIS — I2510 Atherosclerotic heart disease of native coronary artery without angina pectoris: Secondary | ICD-10-CM

## 2014-07-28 DIAGNOSIS — G8929 Other chronic pain: Secondary | ICD-10-CM | POA: Diagnosis not present

## 2014-07-28 DIAGNOSIS — R1013 Epigastric pain: Secondary | ICD-10-CM | POA: Diagnosis not present

## 2014-07-28 DIAGNOSIS — K219 Gastro-esophageal reflux disease without esophagitis: Secondary | ICD-10-CM

## 2014-07-28 NOTE — Progress Notes (Signed)
HISTORY OF PRESENT ILLNESS:  Tonya Russell is a 80 y.o. female with multiple medical problems as listed below. She has a remote history of acute pancreatitis secondary to biliary adenoma for which she underwent endoscopic ampullectomy naproxen 15 years ago. Last seen 04/13/2014 with flare of chronic pancreatitis. Her weight was stable at that time. She was continued on low-fat diet and told to follow-up at this time. She is accompanied by her friend. She continues to feel well. Occasional short-lived tightness in the upper abdomen in the morning. No nausea or vomiting. Normal bowel habits. No weight loss. She continues on omeprazole for GERD with good control  REVIEW OF SYSTEMS:  All non-GI ROS negative except forvisual change, arthritis  Past Medical History  Diagnosis Date  . Pain in joint, pelvic region and thigh   . Other voice and resonance disorders   . Oral aphthae   . Torus fracture of fibula   . Abdominal pain, epigastric   . Palpitations   . Dysphagia, unspecified(787.20)   . Allergic rhinitis, cause unspecified   . Dermatophytosis of nail   . Internal hemorrhoids without mention of complication   . Other abnormal blood chemistry   . Allergic rhinitis due to other allergen   . Esophageal reflux   . Unspecified constipation   . Unspecified hypothyroidism   . Hyperlipidemia   . Hypertension   . Senile osteoporosis   . Diverticulosis of colon (without mention of hemorrhage)   . Malignant neoplasm of ampulla of Vater   . Peripheral vascular disease, unspecified   . Osteoarthrosis, unspecified whether generalized or localized, unspecified site   . Diffuse cystic mastopathy   . Family history of malignant neoplasm of gastrointestinal tract   . Schatzki's ring   . Hiatal hernia   . Pancreatitis     Past Surgical History  Procedure Laterality Date  . Appendectomy    . Cholecystectomy    . Tonsillectomy and adenoidectomy    . Cataract extraction, bilateral    . Resection  of ampullary tumor    . Basal cell carcinoma excision      right humerus, right temple, left side of nose  . Squamous cell cancer      Right leg  . Basal cell carcinoma excision      chest, left cheek  . Keratosis      left arm  . Squamous cell cancer left triceps Left     Dr. Lorenza Russell    Social History Tonya Russell  reports that she has never smoked. She has never used smokeless tobacco. She reports that she does not drink alcohol or use illicit drugs.  family history includes Breast cancer in her sister and another family member; Cancer in her mother; Colon cancer (age of onset: 26) in her father; Colon polyps in her sister; Heart disease in her brother. There is no history of Esophageal cancer, Pancreatic cancer, Kidney disease, or Liver disease.  Allergies  Allergen Reactions  . Codeine Nausea And Vomiting  . Lodine [Etodolac] Nausea And Vomiting  . Penicillins     unknown       PHYSICAL EXAMINATION: Vital signs: BP 120/60 mmHg  Pulse 60  Ht 5\' 3"  (1.6 m)  Wt 170 lb 6.4 oz (77.293 kg)  BMI 30.19 kg/m2 General: Well-developed, well-nourished, no acute distress HEENT: Sclerae are anicteric, conjunctiva pink. Oral mucosa intact Lungs: Clear Heart: Regular Abdomen: soft,obese, nontender, nondistended, no obvious ascites, no peritoneal signs, normal bowel sounds. No organomegaly. Extremities: No edema  Psychiatric: alert and oriented x3. Cooperative   ASSESSMENT:  #1. Chronic pancreatitis. Recent exacerbation has resolved. No pain and stable weight for several months now.  #2. GERD. Controlled with PPI . History of peptic stricture requiring dilation. No dysphagia #3. History of ampullary adenoma status post endoscopic ampullectomy 15 years ago #4. Screening colonoscopy 2003 negative. Aged out of routine surveillance  PLAN:  #1. Continue low-fat diet #2. Continue PPI #3. Resume general medical care with PCP. GI follow-up as needed

## 2014-07-28 NOTE — Patient Instructions (Signed)
Please follow up with Dr. Perry as needed 

## 2014-10-25 ENCOUNTER — Other Ambulatory Visit: Payer: Medicare Other

## 2014-10-26 DIAGNOSIS — Z803 Family history of malignant neoplasm of breast: Secondary | ICD-10-CM | POA: Diagnosis not present

## 2014-10-26 DIAGNOSIS — H4011X1 Primary open-angle glaucoma, mild stage: Secondary | ICD-10-CM | POA: Diagnosis not present

## 2014-10-26 DIAGNOSIS — Z1231 Encounter for screening mammogram for malignant neoplasm of breast: Secondary | ICD-10-CM | POA: Diagnosis not present

## 2014-10-26 LAB — HM MAMMOGRAPHY: HM Mammogram: NEGATIVE

## 2014-10-27 ENCOUNTER — Ambulatory Visit (INDEPENDENT_AMBULATORY_CARE_PROVIDER_SITE_OTHER): Payer: Medicare Other | Admitting: Internal Medicine

## 2014-10-27 ENCOUNTER — Encounter: Payer: Self-pay | Admitting: Internal Medicine

## 2014-10-27 VITALS — BP 138/82 | HR 58 | Temp 98.1°F | Ht 63.0 in | Wt 173.2 lb

## 2014-10-27 DIAGNOSIS — E039 Hypothyroidism, unspecified: Secondary | ICD-10-CM

## 2014-10-27 DIAGNOSIS — J039 Acute tonsillitis, unspecified: Secondary | ICD-10-CM | POA: Insufficient documentation

## 2014-10-27 DIAGNOSIS — R739 Hyperglycemia, unspecified: Secondary | ICD-10-CM | POA: Diagnosis not present

## 2014-10-27 DIAGNOSIS — I10 Essential (primary) hypertension: Secondary | ICD-10-CM | POA: Diagnosis not present

## 2014-10-27 DIAGNOSIS — R7309 Other abnormal glucose: Secondary | ICD-10-CM | POA: Diagnosis not present

## 2014-10-27 DIAGNOSIS — L600 Ingrowing nail: Secondary | ICD-10-CM | POA: Diagnosis not present

## 2014-10-27 DIAGNOSIS — E785 Hyperlipidemia, unspecified: Secondary | ICD-10-CM | POA: Diagnosis not present

## 2014-10-27 MED ORDER — CLINDAMYCIN HCL 300 MG PO CAPS: ORAL_CAPSULE | ORAL | Status: DC

## 2014-10-27 NOTE — Progress Notes (Signed)
Patient ID: Tonya Russell, female   DOB: Jul 11, 1925, 81 y.o.   MRN: [id number]    Facility  [facility]    Place of Service:   OFFICE   Allergies  Allergen Reactions  . Codeine Nausea And Vomiting  . Lodine [Etodolac] Nausea And Vomiting  . Penicillins     unknown    Chief Complaint  Patient presents with  . Medical Management of Chronic Issues    4 Month Follow up    HPI:   Hypothyroidism, unspecified hypothyroidism type: Compensated with TSH 1.710  Essential hypertension: Controlled  Hyperlipidemia: Controlled with LDL 96  Hyperglycemia: Controlled with A1c 5.8  Ingrown nail: Darkened area which is old of the left great toenail medially  Tonsillitis - : Complaints of sore throat. No fever. Denies ear pain. No hemoptysis.    Medications: Patient's Medications  New Prescriptions   No medications on file  Previous Medications   ALPRAZOLAM (XANAX) 0.5 MG TABLET    Take 0.25 mg by mouth 3 (three) times daily as needed for anxiety.    ASPIRIN 81 MG TABLET    Take one tablet once daily to prevent stroke   ATENOLOL (TENORMIN) 50 MG TABLET    Take 50 mg by mouth daily.   BIMATOPROST (LUMIGAN) 0.01 % SOLN    Place 1 drop into both eyes at bedtime.   CALCIUM-MAGNESIUM-VITAMIN D [phone] MG-MG-UNIT TABS    Take one tablet once daily for supplement   FLUTICASONE (FLONASE) 50 MCG/ACT NASAL SPRAY    Place 1 spray into both nostrils daily.   GARLIC PO    Take 1 capsule by mouth daily.   GLUCOSAMINE-CHONDROITIN 500-400 MG TABLET    Take 1 tablet by mouth 3 (three) times daily.    IBUPROFEN (ADVIL,MOTRIN) 200 MG TABLET    Take 200 mg by mouth every 6 (six) hours as needed for mild pain.   INULIN (FIBER CHOICE PO)    Take 5 g by mouth 2 (two) times daily.   LEVOTHYROXINE (SYNTHROID, LEVOTHROID) 50 MCG TABLET    Take 50 mcg by mouth daily before breakfast.   MULTIPLE VITAMIN (MULTIVITAMIN WITH MINERALS) TABS TABLET    Take 1 tablet by mouth daily.   OMEGA-3 FATTY ACIDS (FISH OIL) 1200  MG CAPS    Take 1,200 mg by mouth 3 (three) times daily.    OMEPRAZOLE (PRILOSEC) 20 MG CAPSULE    Take 20 mg by mouth daily.   ONDANSETRON (ZOFRAN ODT) 8 MG DISINTEGRATING TABLET    8mg  ODT q4 hours prn nausea   OVER THE COUNTER MEDICATION    Take 1 tablet by mouth daily. BONE CARE VITAMIN   PRAVASTATIN (PRAVACHOL) 20 MG TABLET    One daily to lower cholesterol  Modified Medications   No medications on file  Discontinued Medications   No medications on file     Review of Systems  Constitutional: Negative for fever, chills, activity change, appetite change, fatigue and unexpected weight change.  HENT: Positive for sore throat. Negative for congestion, dental problem and trouble swallowing.   Eyes: Positive for visual disturbance.  Respiratory: Positive for apnea. Negative for choking, chest tightness and shortness of breath.   Cardiovascular: Negative for chest pain, palpitations and leg swelling.  Gastrointestinal: Positive for constipation. Negative for abdominal pain and abdominal distention.       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 S/P endoscopic surgery. Mild dysphagia and hx of esophageal dysmotility.  Endocrine: Negative.  Hyperglycemia.  Genitourinary: Negative.   Musculoskeletal: Positive for arthralgias. Negative for back pain, neck pain and neck stiffness.  Skin:       Residual lump at the left triceps where she had a SCC removed by Dr. Allyson Sabal 2014. Residual AK of right triceps.  Allergic/Immunologic: Negative.   Neurological: Negative for dizziness, facial asymmetry and headaches.  Psychiatric/Behavioral: Positive for agitation. Negative for behavioral problems and confusion.    Filed Vitals:   10/27/14 1217  BP: 138/82  Pulse: 58  Temp: 98.1 F (36.7 C)  TempSrc: Oral  Height: 5\' 3"  (1.6 m)  Weight: 173 lb 3.2 oz (78.563 kg)   Body mass index is 30.69 kg/(m^2).  Physical Exam  Constitutional: She is oriented to person, place,  and time. She appears well-developed and well-nourished. She appears distressed.  HENT:  Head: Normocephalic and atraumatic.  Right Ear: External ear normal.  Left Ear: External ear normal.  Nose: Nose normal.  Mouth/Throat: Oropharynx is clear and moist.  Erythema surrounding the right tonsillar area. No evidence of leukoplakia. Mild tenderness of the right neck without adenopathy.  Eyes: Conjunctivae and EOM are normal. Pupils are equal, round, and reactive to light.  Corrective lenses  Neck: No JVD present. No tracheal deviation present. No thyromegaly present.  Cardiovascular: Normal rate, regular rhythm, normal heart sounds and intact distal pulses.  Exam reveals no gallop and no friction rub.   No murmur heard. Pulmonary/Chest: Effort normal and breath sounds normal. No respiratory distress. She has no wheezes. She has no rales. She exhibits no tenderness.  Abdominal: She exhibits no distension and no mass. There is no tenderness.  Musculoskeletal: Normal range of motion. She exhibits edema. She exhibits no tenderness.  Neurological: She is alert and oriented to person, place, and time. She has normal reflexes. No cranial nerve deficit. Coordination normal.  Skin: No erythema. No pallor.  Lesions on the right and left triceps area. Old ingrown toenail area of the left great toe medially.  Psychiatric: She has a normal mood and affect. Her behavior is normal. Judgment and thought content normal.     Labs reviewed: No visits with results within 3 Month(s) from this visit. Latest known visit with results is:  Appointment on 06/21/2014  Component Date Value Ref Range Status  . Hgb A1c MFr Bld 06/21/2014 5.8* 4.8 - 5.6 % Final   Comment:          Increased risk for diabetes: 5.7 - 6.4                                   Diabetes: >6.4                                   Glycemic control for adults with diabetes: <7.0  . Est. average glucose Bld gHb Est-m* 06/21/2014 120   Final  .  Glucose 06/21/2014 111* 65 - 99 mg/dL Final  . BUN 06/21/2014 13  8 - 27 mg/dL Final  . Creatinine, Ser 06/21/2014 0.81  0.57 - 1.00 mg/dL Final  . GFR calc non Af Amer 06/21/2014 66  >59 mL/min/1.73 Final  . GFR calc Af Amer 06/21/2014 76  >59 mL/min/1.73 Final  . BUN/Creatinine Ratio 06/21/2014 16  11 - 26 Final  . Sodium 06/21/2014 143  134 - 144 mmol/L Final  . Potassium 06/21/2014 4.0  3.5 - 5.2 mmol/L Final  . Chloride 06/21/2014 102  97 - 108 mmol/L Final  . CO2 06/21/2014 25  18 - 29 mmol/L Final  . Calcium 06/21/2014 9.5  8.7 - 10.3 mg/dL Final  . Total Protein 06/21/2014 6.9  6.0 - 8.5 g/dL Final  . Albumin 06/21/2014 4.2  3.5 - 4.7 g/dL Final  . Globulin, Total 06/21/2014 2.7  1.5 - 4.5 g/dL Final  . Albumin/Globulin Ratio 06/21/2014 1.6  1.1 - 2.5 Final  . Total Bilirubin 06/21/2014 0.6  0.0 - 1.2 mg/dL Final  . Alkaline Phosphatase 06/21/2014 59  39 - 117 IU/L Final  . AST 06/21/2014 22  0 - 40 IU/L Final  . ALT 06/21/2014 18  0 - 32 IU/L Final  . Cholesterol, Total 06/21/2014 194  100 - 199 mg/dL Final   Comment: **Effective July 05, 2014 the reference interval**                            for Cholesterol, Total will be changing to:                                                   0 - 19 years       100 - 169                                                      >19 years       100 - 199  . Triglycerides 06/21/2014 105  0 - 149 mg/dL Final   Comment: **Effective July 05, 2014 the reference interval**                            for Triglycerides will be changing to:                                                   0 -  9 years         0 -  74                                                  10 - 19 years         0 -  89                                                      >19 years         0 - 149  . HDL 06/21/2014 77  >39 mg/dL Final   Comment: According to ATP-III Guidelines, HDL-C >59 mg/dL is considered a  negative risk factor for CHD.    Marland Kitchen VLDL Cholesterol Cal 06/21/2014 21  5 - 40 mg/dL Final  . LDL Calculated 06/21/2014 96  0 - 99 mg/dL Final   Comment: **Effective July 05, 2014 the reference interval**                            for LDL Cholesterol Calc will be changing to:                                                   0 - 19 years         0 - 109                                                      >19 years         0 - 99  . Chol/HDL Ratio 06/21/2014 2.5  0.0 - 4.4 ratio units Final   Comment:                                   T. Chol/HDL Ratio                                                                      Men  Women                                                        1/2 Avg.Risk  3.4    3.3                                                            Avg.Risk  5.0    4.4                                                         2X Avg.Risk  9.6    7.1                                                         3X Avg.Risk 23.4   11.0  . Creatinine, Ur 06/21/2014 113.5  15.0 - 278.0 mg/dL Final  .  Microalbum.,U,Random 06/21/2014 16.4  0.0 - 17.0 ug/mL Final  . MICROALB/CREAT RATIO 06/21/2014 14.4  0.0 - 30.0 mg/g creat Final  . TSH 06/21/2014 1.710  0.450 - 4.500 uIU/mL Final     Assessment/Plan  1. Hypothyroidism, unspecified hypothyroidism type - TSH  2. Essential hypertension Controlled - Comprehensive metabolic panel  3. Hyperlipidemia Controlled - Lipid panel  4. Hyperglycemia Controlled - Comprehensive metabolic panel  5. Ingrown nail Old area with residual staining of brown to blackish blood under the nail.  6. Tonsillitis - clindamycin (CLEOCIN) 300 MG capsule; One 3 times daily for infection  Dispense: 21 capsule; Refill: 0

## 2014-10-27 NOTE — Patient Instructions (Signed)
An order for an antibiotic was sent to your drug store.

## 2014-10-28 LAB — LIPID PANEL
CHOL/HDL RATIO: 2.5 ratio (ref 0.0–4.4)
Cholesterol, Total: 229 mg/dL — ABNORMAL HIGH (ref 100–199)
HDL: 90 mg/dL (ref >39)
LDL CALC: 125 mg/dL — AB (ref 0–99)
Triglycerides: 71 mg/dL (ref 0–149)
VLDL Cholesterol Cal: 14 mg/dL (ref 5–40)

## 2014-10-28 LAB — COMPREHENSIVE METABOLIC PANEL
ALT: 18 IU/L (ref 0–32)
AST: 22 IU/L (ref 0–40)
Albumin/Globulin Ratio: 1.8 (ref 1.1–2.5)
Albumin: 4.4 g/dL (ref 3.5–4.7)
Alkaline Phosphatase: 63 IU/L (ref 39–117)
BUN/Creatinine Ratio: 18 (ref 11–26)
BUN: 13 mg/dL (ref 8–27)
Bilirubin Total: 0.7 mg/dL (ref 0.0–1.2)
CALCIUM: 9.2 mg/dL (ref 8.7–10.3)
CO2: 25 mmol/L (ref 18–29)
CREATININE: 0.73 mg/dL (ref 0.57–1.00)
Chloride: 104 mmol/L (ref 97–108)
GFR calc Af Amer: 85 mL/min/{1.73_m2} (ref >59)
GFR, EST NON AFRICAN AMERICAN: 74 mL/min/{1.73_m2} (ref >59)
GLOBULIN, TOTAL: 2.5 g/dL (ref 1.5–4.5)
GLUCOSE: 96 mg/dL (ref 65–99)
Potassium: 4.1 mmol/L (ref 3.5–5.2)
Sodium: 144 mmol/L (ref 134–144)
Total Protein: 6.9 g/dL (ref 6.0–8.5)

## 2014-10-28 LAB — TSH: TSH: 1.39 u[IU]/mL (ref 0.450–4.500)

## 2014-12-28 DIAGNOSIS — M9901 Segmental and somatic dysfunction of cervical region: Secondary | ICD-10-CM | POA: Diagnosis not present

## 2014-12-28 DIAGNOSIS — M5032 Other cervical disc degeneration, mid-cervical region: Secondary | ICD-10-CM | POA: Diagnosis not present

## 2014-12-28 DIAGNOSIS — M9902 Segmental and somatic dysfunction of thoracic region: Secondary | ICD-10-CM | POA: Diagnosis not present

## 2014-12-28 DIAGNOSIS — S134XXD Sprain of ligaments of cervical spine, subsequent encounter: Secondary | ICD-10-CM | POA: Diagnosis not present

## 2015-01-03 DIAGNOSIS — Z85828 Personal history of other malignant neoplasm of skin: Secondary | ICD-10-CM | POA: Diagnosis not present

## 2015-01-03 DIAGNOSIS — L309 Dermatitis, unspecified: Secondary | ICD-10-CM | POA: Diagnosis not present

## 2015-01-03 DIAGNOSIS — L820 Inflamed seborrheic keratosis: Secondary | ICD-10-CM | POA: Diagnosis not present

## 2015-01-03 DIAGNOSIS — Z08 Encounter for follow-up examination after completed treatment for malignant neoplasm: Secondary | ICD-10-CM | POA: Diagnosis not present

## 2015-01-03 DIAGNOSIS — L570 Actinic keratosis: Secondary | ICD-10-CM | POA: Diagnosis not present

## 2015-01-24 ENCOUNTER — Other Ambulatory Visit: Payer: Self-pay | Admitting: Internal Medicine

## 2015-03-09 ENCOUNTER — Encounter: Payer: Self-pay | Admitting: Internal Medicine

## 2015-03-09 ENCOUNTER — Ambulatory Visit (INDEPENDENT_AMBULATORY_CARE_PROVIDER_SITE_OTHER): Payer: Medicare Other | Admitting: Internal Medicine

## 2015-03-09 VITALS — BP 110/68 | HR 67 | Temp 97.6°F | Resp 20 | Ht 63.0 in | Wt 171.6 lb

## 2015-03-09 DIAGNOSIS — E039 Hypothyroidism, unspecified: Secondary | ICD-10-CM

## 2015-03-09 DIAGNOSIS — K861 Other chronic pancreatitis: Secondary | ICD-10-CM | POA: Diagnosis not present

## 2015-03-09 DIAGNOSIS — I10 Essential (primary) hypertension: Secondary | ICD-10-CM

## 2015-03-09 DIAGNOSIS — R739 Hyperglycemia, unspecified: Secondary | ICD-10-CM | POA: Diagnosis not present

## 2015-03-09 DIAGNOSIS — E785 Hyperlipidemia, unspecified: Secondary | ICD-10-CM | POA: Diagnosis not present

## 2015-03-09 DIAGNOSIS — E049 Nontoxic goiter, unspecified: Secondary | ICD-10-CM

## 2015-03-09 DIAGNOSIS — E042 Nontoxic multinodular goiter: Secondary | ICD-10-CM

## 2015-03-09 NOTE — Progress Notes (Signed)
Patient ID: Tonya Russell, female   DOB: 12/24/1924, 81 y.o.   MRN: [id number]    Facility  [facility]    Place of Service:   OFFICE    Allergies  Allergen Reactions  . Codeine Nausea And Vomiting  . Lodine [Etodolac] Nausea And Vomiting  . Penicillins     unknown    Chief Complaint  Patient presents with  . Medical Management of Chronic Issues    4 month follow-up    HPI:  Chronic pancreatitis, unspecified pancreatitis type - denies indigestion, heartburn, diarrhea. Denies epigastric discomfort or nausea.   Non-toxic nodular goiter - stable. Denies palpitations or chest discomfort. No pain in the neck. TSH needs updating.  Hypothyroidism, unspecified hypothyroidism type - need update TSH. Continues on levothyroxine.  Essential hypertension - controlled  Hyperlipidemia - needs lab update  Hyperglycemia - needs lab update     Medications: Patient's Medications  New Prescriptions   No medications on file  Previous Medications   ALPRAZOLAM (XANAX) 0.5 MG TABLET    Take 0.25 mg by mouth 3 (three) times daily as needed for anxiety.    ASPIRIN 81 MG TABLET    Take one tablet once daily to prevent stroke   ATENOLOL (TENORMIN) 50 MG TABLET    TAKE 1 TABLET DAILY TO CONTROL BLOOD PRESSURE   BIMATOPROST (LUMIGAN) 0.01 % SOLN    Place 1 drop into both eyes at bedtime.   CALCIUM-MAGNESIUM-VITAMIN D [phone] MG-MG-UNIT TABS    Take one tablet once daily for supplement   FLUTICASONE (FLONASE) 50 MCG/ACT NASAL SPRAY    Place 1 spray into both nostrils daily.   GARLIC PO    Take 1 capsule by mouth daily.   GLUCOSAMINE-CHONDROITIN 500-400 MG TABLET    Take 1 tablet by mouth 3 (three) times daily.    IBUPROFEN (ADVIL,MOTRIN) 200 MG TABLET    Take 200 mg by mouth every 6 (six) hours as needed for mild pain.   INULIN (FIBER CHOICE PO)    Take 5 g by mouth 2 (two) times daily.   LEVOTHYROXINE (SYNTHROID, LEVOTHROID) 50 MCG TABLET    TAKE 1 TABLET DAILY FOR THYROID SUPPLEMENT   MULTIPLE  VITAMIN (MULTIVITAMIN WITH MINERALS) TABS TABLET    Take 1 tablet by mouth daily.   OMEGA-3 FATTY ACIDS (FISH OIL) 1200 MG CAPS    Take 1,200 mg by mouth 3 (three) times daily.    OMEPRAZOLE (PRILOSEC) 20 MG CAPSULE    TAKE 1 CAPSULE EVERY DAY  TO  REDUCE  STOMACH  ACID   ONDANSETRON (ZOFRAN ODT) 8 MG DISINTEGRATING TABLET    8mg  ODT q4 hours prn nausea   OVER THE COUNTER MEDICATION    Take 1 tablet by mouth daily. BONE CARE VITAMIN   PRAVASTATIN (PRAVACHOL) 20 MG TABLET    One daily to lower cholesterol  Modified Medications   No medications on file  Discontinued Medications   CLINDAMYCIN (CLEOCIN) 300 MG CAPSULE    One 3 times daily for infection     Review of Systems  Constitutional: Negative for fever, chills, activity change, appetite change, fatigue and unexpected weight change.  HENT: Negative for congestion, dental problem, sore throat and trouble swallowing.   Eyes: Negative for visual disturbance.  Respiratory: Negative for apnea, choking, chest tightness and shortness of breath.   Cardiovascular: Negative for chest pain, palpitations and leg swelling.  Gastrointestinal: Negative for abdominal pain, constipation and abdominal distention.       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 S/P endoscopic surgery. Mild dysphagia and hx of esophageal dysmotility.  Endocrine: Negative.        Hyperglycemia.  Genitourinary: Negative.   Musculoskeletal: Positive for arthralgias. Negative for back pain, neck pain and neck stiffness.  Skin:       Residual lump at the left triceps where she had a SCC removed by Dr. Allyson Sabal 2014. Residual AK of right triceps.  Allergic/Immunologic: Negative.   Neurological: Negative for dizziness, facial asymmetry and headaches.  Psychiatric/Behavioral: Negative for behavioral problems, confusion and agitation.    Filed Vitals:   03/09/15 1107 03/09/15 1123  BP: 158/84 110/68  Pulse: 67   Temp: 97.6 F (36.4 C)   TempSrc: Oral     Resp: 20   Height: 5\' 3"  (1.6 m)   Weight: 171 lb 9.6 oz (77.837 kg)   SpO2: 95%    Body mass index is 30.41 kg/(m^2).  Physical Exam  Constitutional: She is oriented to person, place, and time. She appears well-developed and well-nourished. No distress.  HENT:  Head: Normocephalic and atraumatic.  Right Ear: External ear normal.  Left Ear: External ear normal.  Nose: Nose normal.  Mouth/Throat: Oropharynx is clear and moist.  Eyes: Conjunctivae and EOM are normal. Pupils are equal, round, and reactive to light.  Corrective lenses  Neck: No JVD present. No tracheal deviation present. No thyromegaly present.  Cardiovascular: Normal rate, regular rhythm, normal heart sounds and intact distal pulses.  Exam reveals no gallop and no friction rub.   No murmur heard. Pulmonary/Chest: Effort normal and breath sounds normal. No respiratory distress. She has no wheezes. She has no rales. She exhibits no tenderness.  Abdominal: She exhibits no distension and no mass. There is no tenderness.  Musculoskeletal: Normal range of motion. She exhibits no edema or tenderness.  Neurological: She is alert and oriented to person, place, and time. She has normal reflexes. No cranial nerve deficit. Coordination normal.  Skin: No erythema. No pallor.  Lesions on the right and left triceps area. Old ingrown toenail area of the left great toe medially.  Psychiatric: She has a normal mood and affect. Her behavior is normal. Judgment and thought content normal.     Labs reviewed: No visits with results within 3 Month(s) from this visit. Latest known visit with results is:  Abstract on 10/28/2014  Component Date Value Ref Range Status  . HM Mammogram 10/26/2014 [facility] (Negative)   Final     Assessment/Plan  Chronic pancreatitis, unspecified pancreatitis type - Plan: CMP  Non-toxic nodular goiter - Plan: TSH  Hypothyroidism, unspecified hypothyroidism type - Plan: TSH  Essential  hypertension - Plan: CMP  Hyperlipidemia - Plan: Lipid panel  Hyperglycemia - Plan: CMP

## 2015-03-10 LAB — COMPREHENSIVE METABOLIC PANEL
ALT: 18 IU/L (ref 0–32)
AST: 23 IU/L (ref 0–40)
Albumin/Globulin Ratio: 1.8 (ref 1.1–2.5)
Albumin: 4.2 g/dL (ref 3.5–4.7)
Alkaline Phosphatase: 49 IU/L (ref 39–117)
BUN / CREAT RATIO: 24 (ref 11–26)
BUN: 19 mg/dL (ref 8–27)
Bilirubin Total: 0.5 mg/dL (ref 0.0–1.2)
CO2: 25 mmol/L (ref 18–29)
Calcium: 9.4 mg/dL (ref 8.7–10.3)
Chloride: 103 mmol/L (ref 97–108)
Creatinine, Ser: 0.8 mg/dL (ref 0.57–1.00)
GFR, EST AFRICAN AMERICAN: 76 mL/min/{1.73_m2} (ref >59)
GFR, EST NON AFRICAN AMERICAN: 66 mL/min/{1.73_m2} (ref >59)
GLOBULIN, TOTAL: 2.3 g/dL (ref 1.5–4.5)
GLUCOSE: 112 mg/dL — AB (ref 65–99)
Potassium: 4 mmol/L (ref 3.5–5.2)
Sodium: 141 mmol/L (ref 134–144)
Total Protein: 6.5 g/dL (ref 6.0–8.5)

## 2015-03-10 LAB — LIPID PANEL
Chol/HDL Ratio: 2.7 ratio units (ref 0.0–4.4)
Cholesterol, Total: 218 mg/dL — ABNORMAL HIGH (ref 100–199)
HDL: 82 mg/dL (ref >39)
LDL CALC: 121 mg/dL — AB (ref 0–99)
Triglycerides: 75 mg/dL (ref 0–149)
VLDL Cholesterol Cal: 15 mg/dL (ref 5–40)

## 2015-03-10 LAB — TSH: TSH: 1.53 u[IU]/mL (ref 0.450–4.500)

## 2015-03-23 DIAGNOSIS — H4011X1 Primary open-angle glaucoma, mild stage: Secondary | ICD-10-CM | POA: Diagnosis not present

## 2015-03-24 ENCOUNTER — Other Ambulatory Visit: Payer: Self-pay | Admitting: Internal Medicine

## 2015-07-13 ENCOUNTER — Other Ambulatory Visit: Payer: Self-pay | Admitting: Internal Medicine

## 2015-08-27 ENCOUNTER — Other Ambulatory Visit: Payer: Self-pay | Admitting: Internal Medicine

## 2015-09-07 ENCOUNTER — Ambulatory Visit (INDEPENDENT_AMBULATORY_CARE_PROVIDER_SITE_OTHER): Payer: Medicare Other | Admitting: Internal Medicine

## 2015-09-07 ENCOUNTER — Encounter: Payer: Self-pay | Admitting: Internal Medicine

## 2015-09-07 VITALS — BP 130/58 | HR 57 | Temp 97.1°F | Resp 20 | Ht 63.0 in | Wt 165.8 lb

## 2015-09-07 DIAGNOSIS — E785 Hyperlipidemia, unspecified: Secondary | ICD-10-CM

## 2015-09-07 DIAGNOSIS — R739 Hyperglycemia, unspecified: Secondary | ICD-10-CM

## 2015-09-07 DIAGNOSIS — K861 Other chronic pancreatitis: Secondary | ICD-10-CM

## 2015-09-07 DIAGNOSIS — E669 Obesity, unspecified: Secondary | ICD-10-CM

## 2015-09-07 DIAGNOSIS — I10 Essential (primary) hypertension: Secondary | ICD-10-CM

## 2015-09-07 DIAGNOSIS — C241 Malignant neoplasm of ampulla of Vater: Secondary | ICD-10-CM

## 2015-09-07 DIAGNOSIS — Z23 Encounter for immunization: Secondary | ICD-10-CM | POA: Diagnosis not present

## 2015-09-07 DIAGNOSIS — E039 Hypothyroidism, unspecified: Secondary | ICD-10-CM

## 2015-09-07 NOTE — Progress Notes (Signed)
Patient ID: Tonya Russell, female   DOB: 01-01-1925, 81 y.o.   MRN: [id number]    Facility  [facility]    Place of Service:   OFFICE    Allergies  Allergen Reactions  . Codeine Nausea And Vomiting  . Lodine [Etodolac] Nausea And Vomiting  . Penicillins     unknown    Chief Complaint  Patient presents with  . Medical Management of Chronic Issues    6 mo f/u   chronic pancreatis    HPI:  Eyes seem out of focus. To see ophth, Dr. Satira Sark 09/26/15.  Seems wobbly. May be related to her vision. Unsteady on the feet.  She asks if the pravastatin could make her feel bad.   Right ankle was burning and hurting a couple of weeks ago. Used a Sports cream t help it. Hx of facture about 4 years ago.  Feels like her body is shaking sometimes. At times having to make a decision makes her nervous.   Nauseous this morning.  Medications: Patient's Medications  New Prescriptions   No medications on file  Previous Medications   ALPRAZOLAM (XANAX) 0.5 MG TABLET    Take 0.25 mg by mouth 3 (three) times daily as needed for anxiety.    ASPIRIN 81 MG TABLET    Take one tablet once daily to prevent stroke   ATENOLOL (TENORMIN) 50 MG TABLET    TAKE 1 TABLET DAILY TO CONTROL BLOOD PRESSURE   BIMATOPROST (LUMIGAN) 0.01 % SOLN    Place 1 drop into both eyes at bedtime.   CALCIUM-MAGNESIUM-VITAMIN D [phone] MG-MG-UNIT TABS    Take one tablet once daily for supplement   FLUTICASONE (FLONASE) 50 MCG/ACT NASAL SPRAY    Place 1 spray into both nostrils daily.   GARLIC PO    Take 1 capsule by mouth daily.   GLUCOSAMINE-CHONDROITIN 500-400 MG TABLET    Take 1 tablet by mouth 3 (three) times daily.    IBUPROFEN (ADVIL,MOTRIN) 200 MG TABLET    Take 200 mg by mouth every 6 (six) hours as needed for mild pain.   INULIN (FIBER CHOICE PO)    Take 5 g by mouth 2 (two) times daily.   LEVOTHYROXINE (SYNTHROID, LEVOTHROID) 50 MCG TABLET    TAKE 1 TABLET DAILY FOR THYROID SUPPLEMENT   MULTIPLE VITAMIN (MULTIVITAMIN WITH  MINERALS) TABS TABLET    Take 1 tablet by mouth daily.   OMEGA-3 FATTY ACIDS (FISH OIL) 1200 MG CAPS    Take 1,200 mg by mouth 3 (three) times daily.    OMEPRAZOLE (PRILOSEC) 20 MG CAPSULE    TAKE 1 CAPSULE EVERY DAY  TO  REDUCE  STOMACH  ACID   ONDANSETRON (ZOFRAN ODT) 8 MG DISINTEGRATING TABLET    3m ODT q4 hours prn nausea   OVER THE COUNTER MEDICATION    Take 1 tablet by mouth daily. BONE CARE VITAMIN   PRAVASTATIN (PRAVACHOL) 20 MG TABLET    TAKE 1 TABLET EVERY DAY  TO  LOWER  CHOLESTEROL  Modified Medications   No medications on file  Discontinued Medications   No medications on file    Review of Systems  Constitutional: Negative for fever, chills, activity change, appetite change, fatigue and unexpected weight change.  HENT: Negative for congestion, dental problem, sore throat and trouble swallowing.   Eyes: Negative for visual disturbance.  Respiratory: Negative for apnea, choking, chest tightness and shortness of breath.   Cardiovascular: Negative for chest pain, palpitations and leg swelling.  Gastrointestinal: Negative  for abdominal pain, constipation and abdominal distention.       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 S/P endoscopic surgery. Mild dysphagia and hx of esophageal dysmotility.  Endocrine: Negative.        Hyperglycemia.  Genitourinary: Negative.   Musculoskeletal: Positive for arthralgias. Negative for back pain, neck pain and neck stiffness.  Skin:       Residual lump at the left triceps where she had a SCC removed by Dr. Lupton 2014. Residual AK of right triceps.  Allergic/Immunologic: Negative.   Neurological: Negative for dizziness, facial asymmetry and headaches.  Psychiatric/Behavioral: Negative for behavioral problems, confusion and agitation.    Filed Vitals:   09/07/15 1148  BP: 130/58  Pulse: 57  Temp: 97.1 F (36.2 C)  TempSrc: Oral  Resp: 20  Height: 5' 3" (1.6 m)  Weight: 165 lb 12.8 oz (75.206 kg)  SpO2: 98%    Body mass index is 29.38 kg/(m^2). Filed Weights   09/07/15 1148  Weight: 165 lb 12.8 oz (75.206 kg)   03/09/15= 171#   Physical Exam  Constitutional: She is oriented to person, place, and time. She appears well-developed and well-nourished. No distress.  HENT:  Head: Normocephalic and atraumatic.  Right Ear: External ear normal.  Left Ear: External ear normal.  Nose: Nose normal.  Mouth/Throat: Oropharynx is clear and moist.  Eyes: Conjunctivae and EOM are normal. Pupils are equal, round, and reactive to light.  Corrective lenses  Neck: No JVD present. No tracheal deviation present. No thyromegaly present.  Cardiovascular: Normal rate, regular rhythm, normal heart sounds and intact distal pulses.  Exam reveals no gallop and no friction rub.   No murmur heard. Pulmonary/Chest: Effort normal and breath sounds normal. No respiratory distress. She has no wheezes. She has no rales. She exhibits no tenderness.  Abdominal: She exhibits no distension and no mass. There is no tenderness.  Musculoskeletal: Normal range of motion. She exhibits no edema or tenderness.  Neurological: She is alert and oriented to person, place, and time. She has normal reflexes. No cranial nerve deficit. Coordination normal.  Skin: No erythema. No pallor.  Lesions on the right and left triceps area. Old ingrown toenail area of the left great toe medially.  Psychiatric: She has a normal mood and affect. Her behavior is normal. Judgment and thought content normal.    Labs reviewed: Lab Summary Latest Ref Rng 03/09/2015 10/27/2014 06/21/2014  Hemoglobin 13.0-17.0 g/dL (None) (None) (None)  Hematocrit 39.0-52.0 % (None) (None) (None)  White count - (None) (None) (None)  Platelet count - (None) (None) (None)  Sodium 134 - 144 mmol/L 141 144 143  Potassium 3.5 - 5.2 mmol/L 4.0 4.1 4.0  Calcium 8.7 - 10.3 mg/dL 9.4 9.2 9.5  Phosphorus - (None) (None) (None)  Creatinine 0.57 - 1.00 mg/dL 0.80 0.73 0.81  AST 0 -  40 IU/L 23 22 22  Alk Phos 39 - 117 IU/L 49 63 59  Bilirubin 0.0 - 1.2 mg/dL 0.5 0.7 0.6  Glucose 65 - 99 mg/dL 112(H) 96 111(H)  Cholesterol - (None) (None) (None)  HDL cholesterol >39 mg/dL 82 90 77  Triglycerides 0 - 149 mg/dL 75 71 105  LDL Direct - (None) (None) (None)  LDL Calc 0 - 99 mg/dL 121(H) 125(H) 96  Total protein - (None) (None) (None)  Albumin 3.5 - 4.7 g/dL 4.2 4.4 4.2   Lab Results  Component Value Date   TSH 1.530 03/09/2015   TSH 1.390 10/27/2014     TSH 1.710 06/21/2014   Lab Results  Component Value Date   BUN 19 03/09/2015   BUN 13 10/27/2014   BUN 13 06/21/2014   Lab Results  Component Value Date   HGBA1C 5.8* 06/21/2014   HGBA1C 5.6 12/09/2012    Assessment/Plan.diagm 1. Chronic pancreatitis, unspecified pancreatitis type ([place]) Uncertain whether the mild nausea and weight loss could be related to this diagnosis. We'll observe the present time.  2. Hyperglycemia - CMP  3. Hyperlipidemia - Lipid panel  4. Essential hypertension - CMP  5. Hypothyroidism, unspecified hypothyroidism type - TSH  6. 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 Mildly obese Losing weight  8. Need for prophylactic vaccination and inoculation against influenza - Flu Vaccine QUAD 36+ mos PF IM (Fluarix & Fluzone Quad PF)

## 2015-09-07 NOTE — Patient Instructions (Signed)
Stop pravastatin

## 2015-09-08 LAB — CBC WITH DIFFERENTIAL
BASOS: 1 %
Basophils Absolute: 0 10*3/uL (ref 0.0–0.2)
EOS (ABSOLUTE): 0.1 10*3/uL (ref 0.0–0.4)
Eos: 1 %
Hematocrit: 41.1 % (ref 34.0–46.6)
Hemoglobin: 14 g/dL (ref 11.1–15.9)
Immature Grans (Abs): 0 10*3/uL (ref 0.0–0.1)
Immature Granulocytes: 0 %
LYMPHS ABS: 1.7 10*3/uL (ref 0.7–3.1)
Lymphs: 34 %
MCH: 31.7 pg (ref 26.6–33.0)
MCHC: 34.1 g/dL (ref 31.5–35.7)
MCV: 93 fL (ref 79–97)
MONOS ABS: 0.3 10*3/uL (ref 0.1–0.9)
Monocytes: 6 %
NEUTROS PCT: 58 %
Neutrophils Absolute: 2.9 10*3/uL (ref 1.4–7.0)
RBC: 4.42 x10E6/uL (ref 3.77–5.28)
RDW: 13.3 % (ref 12.3–15.4)
WBC: 5.1 10*3/uL (ref 3.4–10.8)

## 2015-09-08 LAB — COMPREHENSIVE METABOLIC PANEL
A/G RATIO: 2 (ref 1.1–2.5)
ALK PHOS: 56 IU/L (ref 39–117)
ALT: 17 IU/L (ref 0–32)
AST: 24 IU/L (ref 0–40)
Albumin: 4.5 g/dL (ref 3.5–4.7)
BILIRUBIN TOTAL: 0.6 mg/dL (ref 0.0–1.2)
BUN / CREAT RATIO: 19 (ref 11–26)
BUN: 13 mg/dL (ref 8–27)
CALCIUM: 9.3 mg/dL (ref 8.7–10.3)
CO2: 25 mmol/L (ref 18–29)
CREATININE: 0.7 mg/dL (ref 0.57–1.00)
Chloride: 102 mmol/L (ref 96–106)
GFR calc Af Amer: 89 mL/min/{1.73_m2} (ref >59)
GFR, EST NON AFRICAN AMERICAN: 78 mL/min/{1.73_m2} (ref >59)
GLOBULIN, TOTAL: 2.2 g/dL (ref 1.5–4.5)
Glucose: 108 mg/dL — ABNORMAL HIGH (ref 65–99)
Potassium: 4.2 mmol/L (ref 3.5–5.2)
Sodium: 143 mmol/L (ref 134–144)
Total Protein: 6.7 g/dL (ref 6.0–8.5)

## 2015-09-08 LAB — TSH: TSH: 1.52 u[IU]/mL (ref 0.450–4.500)

## 2015-09-08 LAB — LIPID PANEL
CHOLESTEROL TOTAL: 221 mg/dL — AB (ref 100–199)
Chol/HDL Ratio: 2.8 ratio units (ref 0.0–4.4)
HDL: 80 mg/dL (ref >39)
LDL CALC: 121 mg/dL — AB (ref 0–99)
Triglycerides: 99 mg/dL (ref 0–149)
VLDL CHOLESTEROL CAL: 20 mg/dL (ref 5–40)

## 2015-09-26 DIAGNOSIS — H43813 Vitreous degeneration, bilateral: Secondary | ICD-10-CM | POA: Diagnosis not present

## 2015-09-26 DIAGNOSIS — H52203 Unspecified astigmatism, bilateral: Secondary | ICD-10-CM | POA: Diagnosis not present

## 2015-09-26 DIAGNOSIS — H40011 Open angle with borderline findings, low risk, right eye: Secondary | ICD-10-CM | POA: Diagnosis not present

## 2015-09-26 DIAGNOSIS — H40012 Open angle with borderline findings, low risk, left eye: Secondary | ICD-10-CM | POA: Diagnosis not present

## 2015-09-29 ENCOUNTER — Telehealth: Payer: Self-pay

## 2015-09-29 NOTE — Telephone Encounter (Signed)
When she saw Dr. Nyoka Cowden 09/07/15 she talked to him about being dizzy, he recommend her seeing her eye doctor. She saw him, he told her nothing wrong with eyes that would cause her dizziness. Made appt with Dr. Nyoka Cowden 10/11/13 @ 1:45. (no openings till then, could make appt with someone else, but she wanted Dr. Nyoka Cowden). She asked about getting an appt with Dr. Henrene Pastor for her Endoscopy, she has seen him before for this. I told her she could make that appt herself. She didn't know if she should wait until she saw Dr. Nyoka Cowden, but she is having some abdominal problems. I told her she shouldn't wait and go ahead and call Dr. Blanch Media office. If she has any  Problems call back.

## 2015-10-12 ENCOUNTER — Ambulatory Visit (INDEPENDENT_AMBULATORY_CARE_PROVIDER_SITE_OTHER): Payer: Medicare Other | Admitting: Internal Medicine

## 2015-10-12 ENCOUNTER — Encounter: Payer: Self-pay | Admitting: Internal Medicine

## 2015-10-12 VITALS — BP 140/70 | HR 60 | Temp 97.4°F | Resp 20 | Ht 63.0 in | Wt 164.2 lb

## 2015-10-12 DIAGNOSIS — R42 Dizziness and giddiness: Secondary | ICD-10-CM

## 2015-10-12 MED ORDER — LEVOTHYROXINE SODIUM 50 MCG PO TABS: ORAL_TABLET | ORAL | Status: DC

## 2015-10-12 MED ORDER — CETIRIZINE-PSEUDOEPHEDRINE ER 5-120 MG PO TB12: ORAL_TABLET | ORAL | Status: DC

## 2015-10-12 NOTE — Progress Notes (Signed)
Patient ID: Tonya Russell, female   DOB: April 03, 1925, 82 y.o.   MRN: [id number]    Facility  [facility]    Place of Service:   OFFICE    Allergies  Allergen Reactions  . Codeine Nausea And Vomiting  . Lodine [Etodolac] Nausea And Vomiting  . Penicillins     unknown    Chief Complaint  Patient presents with  . Acute Visit    Patient c/o Still dizzy    HPI:  Seen routinely 09/07/15. On 09/29/15 she called to report continued dizziness.  Telephone note 09/29/15:  When she saw Dr. Nyoka Cowden 09/07/15 she talked to him about being dizzy, he recommend her seeing her eye doctor. She saw him, he told her nothing wrong with eyes that would cause her dizziness. Made appt with Dr. Nyoka Cowden 10/11/13 @ 1:45. (no openings till then, could make appt with someone else, but she wanted Dr. Nyoka Cowden). She asked about getting an appt with Dr. Henrene Pastor for her Endoscopy, she has seen him before for this. I told her she could make that appt herself. She didn't know if she should wait until she saw Dr. Nyoka Cowden, but she is having some abdominal problems. I told her she shouldn't wait and go ahead and call Dr. Blanch Media office. If she has any Problems call back.  No fever. Some nausea. Pain in the mastoids and in the frontal sinuses.   She took a decongestant medication last week that had pseudoephedrine and it seemed to help.  Episode of hand discoloration after her last visit. Very suggestive of Raynaud's disease.  Medications: Patient's Medications  New Prescriptions   No medications on file  Previous Medications   ALPRAZOLAM (XANAX) 0.5 MG TABLET    Take 0.25 mg by mouth 3 (three) times daily as needed for anxiety.    ASPIRIN 81 MG TABLET    Take one tablet once daily to prevent stroke   ATENOLOL (TENORMIN) 50 MG TABLET    TAKE 1 TABLET DAILY TO CONTROL BLOOD PRESSURE   BIMATOPROST (LUMIGAN) 0.01 % SOLN    Place 1 drop into both eyes at bedtime.   CALCIUM-MAGNESIUM-VITAMIN D [id number] MG-MG-UNIT TABS    Take one tablet  once daily for supplement   FLUTICASONE (FLONASE) 50 MCG/ACT NASAL SPRAY    Place 1 spray into both nostrils daily.   GARLIC PO    Take 1 capsule by mouth daily.   GLUCOSAMINE-CHONDROITIN 500-400 MG TABLET    Take 1 tablet by mouth 3 (three) times daily.    IBUPROFEN (ADVIL,MOTRIN) 200 MG TABLET    Take 200 mg by mouth every 6 (six) hours as needed for mild pain.   INULIN (FIBER CHOICE PO)    Take 5 g by mouth 2 (two) times daily.   LEVOTHYROXINE (SYNTHROID, LEVOTHROID) 50 MCG TABLET    TAKE 1 TABLET DAILY FOR THYROID SUPPLEMENT   MULTIPLE VITAMIN (MULTIVITAMIN WITH MINERALS) TABS TABLET    Take 1 tablet by mouth daily.   OMEGA-3 FATTY ACIDS (FISH OIL) 1200 MG CAPS    Take 1,200 mg by mouth 3 (three) times daily.    OMEPRAZOLE (PRILOSEC) 20 MG CAPSULE    TAKE 1 CAPSULE EVERY DAY  TO  REDUCE  STOMACH  ACID   ONDANSETRON (ZOFRAN ODT) 8 MG DISINTEGRATING TABLET    63m ODT q4 hours prn nausea   OVER THE COUNTER MEDICATION    Take 1 tablet by mouth daily. BONE CARE VITAMIN  Modified Medications   No medications on file  Discontinued Medications   No medications on file    Review of Systems  Constitutional: Negative for fever, chills, activity change, appetite change, fatigue and unexpected weight change.  HENT: Negative for congestion, dental problem, sore throat and trouble swallowing.   Eyes: Negative for visual disturbance.  Respiratory: Negative for apnea, choking, chest tightness and shortness of breath.   Cardiovascular: Negative for chest pain, palpitations and leg swelling.  Gastrointestinal: Negative for abdominal pain, constipation and abdominal distention.       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 S/P endoscopic surgery. Mild dysphagia and hx of esophageal dysmotility.  Endocrine: Negative.        Hyperglycemia.  Genitourinary: Negative.   Musculoskeletal: Positive for arthralgias. Negative for back pain, neck pain and neck stiffness.  Skin:        Residual lump at the left triceps where she had a SCC removed by Dr. Allyson Sabal 2014. Residual AK of right triceps.  Allergic/Immunologic: Negative.   Neurological: Positive for dizziness and light-headedness. Negative for facial asymmetry and headaches.  Psychiatric/Behavioral: Negative for behavioral problems, confusion and agitation.    Filed Vitals:   10/12/15 1409  BP: 140/70  Pulse: 60  Temp: 97.4 F (36.3 C)  TempSrc: Oral  Resp: 20  Height: 5' 3"  (1.6 m)  Weight: 164 lb 3.2 oz (74.481 kg)  SpO2: 95%   Body mass index is 29.09 kg/(m^2). Filed Weights   10/12/15 1409  Weight: 164 lb 3.2 oz (74.481 kg)     Physical Exam  Constitutional: She is oriented to person, place, and time. She appears well-developed and well-nourished. No distress.  HENT:  Head: Normocephalic and atraumatic.  Right Ear: External ear normal.  Left Ear: External ear normal.  Nose: Nose normal.  Mouth/Throat: Oropharynx is clear and moist.  Eyes: Conjunctivae and EOM are normal. Pupils are equal, round, and reactive to light.  Corrective lenses  Neck: No JVD present. No tracheal deviation present. No thyromegaly present.  Cardiovascular: Normal rate, regular rhythm, normal heart sounds and intact distal pulses.  Exam reveals no gallop and no friction rub.   No murmur heard. Pulmonary/Chest: Effort normal and breath sounds normal. No respiratory distress. She has no wheezes. She has no rales. She exhibits no tenderness.  Abdominal: She exhibits no distension and no mass. There is no tenderness.  Musculoskeletal: Normal range of motion. She exhibits no edema or tenderness.  Neurological: She is alert and oriented to person, place, and time. She has normal reflexes. No cranial nerve deficit. Coordination normal.  Skin: No erythema. No pallor.  Lesions on the right and left triceps area. Old ingrown toenail area of the left great toe medially.  Psychiatric: She has a normal mood and affect. Her behavior  is normal. Judgment and thought content normal.    Labs reviewed: Lab Summary Latest Ref Rng 09/07/2015 03/09/2015 10/27/2014  Hemoglobin 11.1 - 15.9 g/dL 14.0 (None) (None)  Hematocrit 34.0 - 46.6 % 41.1 (None) (None)  White count 3.4 - 10.8 x10E3/uL 5.1 (None) (None)  Platelet count - (None) (None) (None)  Sodium 134 - 144 mmol/L 143 141 144  Potassium 3.5 - 5.2 mmol/L 4.2 4.0 4.1  Calcium 8.7 - 10.3 mg/dL 9.3 9.4 9.2  Phosphorus - (None) (None) (None)  Creatinine 0.57 - 1.00 mg/dL 0.70 0.80 0.73  AST 0 - 40 IU/L 24 23 22   Alk Phos 39 - 117 IU/L 56 49 63  Bilirubin 0.0 - 1.2 mg/dL 0.6 0.5 0.7  Glucose  65 - 99 mg/dL 108(H) 112(H) 96  Cholesterol - (None) (None) (None)  HDL cholesterol >39 mg/dL 80 82 90  Triglycerides 0 - 149 mg/dL 99 75 71  LDL Direct - (None) (None) (None)  LDL Calc 0 - 99 mg/dL 121(H) 121(H) 125(H)  Total protein - (None) (None) (None)  Albumin 3.5 - 4.7 g/dL 4.5 4.2 4.4   Lab Results  Component Value Date   TSH 1.520 09/07/2015   TSH 1.530 03/09/2015   TSH 1.390 10/27/2014   Lab Results  Component Value Date   BUN 13 09/07/2015   BUN 19 03/09/2015   BUN 13 10/27/2014   Lab Results  Component Value Date   HGBA1C 5.8* 06/21/2014   HGBA1C 5.6 12/09/2012    Assessment/Plan  1. Dizzy - CT Head Wo Contrast; Future - DG Mastoid 3 Views; Future - cetirizine-pseudoephedrine (ZYRTEC-D ALLERGY & CONGESTION) 5-120 MG tablet; One twice daily to help congestion and dizzy feelings  Dispense: 30 tablet; Refill: 4

## 2015-10-14 ENCOUNTER — Telehealth: Payer: Self-pay | Admitting: *Deleted

## 2015-10-14 NOTE — Telephone Encounter (Signed)
OK 

## 2015-10-14 NOTE — Telephone Encounter (Signed)
Patient called and stated that you wanted her to call regarding the new medication you put her on for her dizziness. Has taken four now from the time you prescribed and she thinks it has helped some. Patient stated that she is willing to stay on it and try it and will follow up with you.

## 2015-10-17 DIAGNOSIS — M9902 Segmental and somatic dysfunction of thoracic region: Secondary | ICD-10-CM | POA: Diagnosis not present

## 2015-10-17 DIAGNOSIS — M9901 Segmental and somatic dysfunction of cervical region: Secondary | ICD-10-CM | POA: Diagnosis not present

## 2015-10-17 DIAGNOSIS — S134XXD Sprain of ligaments of cervical spine, subsequent encounter: Secondary | ICD-10-CM | POA: Diagnosis not present

## 2015-10-17 DIAGNOSIS — M50322 Other cervical disc degeneration at C5-C6 level: Secondary | ICD-10-CM | POA: Diagnosis not present

## 2015-10-20 ENCOUNTER — Telehealth: Payer: Self-pay | Admitting: Internal Medicine

## 2015-10-20 NOTE — Telephone Encounter (Signed)
FYI - Called patient today to follow up on orders for X-ray & CT of Head, patient stated she is doing better and would like to continue to hold off on having tests done. She stated if she changes her mind and feels like she needs them she will give [place] a call back.

## 2015-10-21 ENCOUNTER — Ambulatory Visit: Payer: Medicare Other | Admitting: Internal Medicine

## 2015-10-21 NOTE — Telephone Encounter (Signed)
Noted  

## 2015-10-28 ENCOUNTER — Telehealth: Payer: Self-pay

## 2015-10-28 ENCOUNTER — Ambulatory Visit
Admission: RE | Admit: 2015-10-28 | Discharge: 2015-10-28 | Disposition: A | Discharge status: Home / Self Care | Payer: Medicare Other | Source: Ambulatory Visit | Attending: Internal Medicine | Admitting: Internal Medicine

## 2015-10-28 DIAGNOSIS — R42 Dizziness and giddiness: Secondary | ICD-10-CM

## 2015-10-28 DIAGNOSIS — H7011 Chronic mastoiditis, right ear: Secondary | ICD-10-CM | POA: Diagnosis not present

## 2015-10-28 NOTE — Telephone Encounter (Signed)
Error

## 2015-10-31 DIAGNOSIS — Z803 Family history of malignant neoplasm of breast: Secondary | ICD-10-CM | POA: Diagnosis not present

## 2015-10-31 DIAGNOSIS — Z1231 Encounter for screening mammogram for malignant neoplasm of breast: Secondary | ICD-10-CM | POA: Diagnosis not present

## 2015-10-31 LAB — HM MAMMOGRAPHY

## 2015-11-02 ENCOUNTER — Encounter: Payer: Self-pay | Admitting: *Deleted

## 2015-11-11 ENCOUNTER — Telehealth: Payer: Self-pay | Admitting: *Deleted

## 2015-11-11 NOTE — Telephone Encounter (Signed)
Patient called requesting her Mastoid Results she had done. Report was Negative. Patient informed.

## 2015-11-14 DIAGNOSIS — S134XXD Sprain of ligaments of cervical spine, subsequent encounter: Secondary | ICD-10-CM | POA: Diagnosis not present

## 2015-11-14 DIAGNOSIS — M9902 Segmental and somatic dysfunction of thoracic region: Secondary | ICD-10-CM | POA: Diagnosis not present

## 2015-11-14 DIAGNOSIS — M50322 Other cervical disc degeneration at C5-C6 level: Secondary | ICD-10-CM | POA: Diagnosis not present

## 2015-11-14 DIAGNOSIS — M9901 Segmental and somatic dysfunction of cervical region: Secondary | ICD-10-CM | POA: Diagnosis not present

## 2015-12-12 DIAGNOSIS — S134XXD Sprain of ligaments of cervical spine, subsequent encounter: Secondary | ICD-10-CM | POA: Diagnosis not present

## 2015-12-12 DIAGNOSIS — M9901 Segmental and somatic dysfunction of cervical region: Secondary | ICD-10-CM | POA: Diagnosis not present

## 2015-12-12 DIAGNOSIS — M9902 Segmental and somatic dysfunction of thoracic region: Secondary | ICD-10-CM | POA: Diagnosis not present

## 2015-12-12 DIAGNOSIS — M50322 Other cervical disc degeneration at C5-C6 level: Secondary | ICD-10-CM | POA: Diagnosis not present

## 2015-12-27 ENCOUNTER — Other Ambulatory Visit: Payer: Self-pay | Admitting: Internal Medicine

## 2016-01-04 ENCOUNTER — Ambulatory Visit (INDEPENDENT_AMBULATORY_CARE_PROVIDER_SITE_OTHER): Payer: Medicare Other | Admitting: Internal Medicine

## 2016-01-04 ENCOUNTER — Encounter: Payer: Self-pay | Admitting: Internal Medicine

## 2016-01-04 VITALS — BP 140/80 | HR 57 | Temp 97.6°F | Ht 63.0 in | Wt 164.0 lb

## 2016-01-04 DIAGNOSIS — K861 Other chronic pancreatitis: Secondary | ICD-10-CM

## 2016-01-04 DIAGNOSIS — R739 Hyperglycemia, unspecified: Secondary | ICD-10-CM | POA: Diagnosis not present

## 2016-01-04 DIAGNOSIS — I10 Essential (primary) hypertension: Secondary | ICD-10-CM | POA: Diagnosis not present

## 2016-01-04 DIAGNOSIS — E039 Hypothyroidism, unspecified: Secondary | ICD-10-CM

## 2016-01-04 DIAGNOSIS — E785 Hyperlipidemia, unspecified: Secondary | ICD-10-CM | POA: Diagnosis not present

## 2016-01-04 DIAGNOSIS — C241 Malignant neoplasm of ampulla of Vater: Secondary | ICD-10-CM

## 2016-01-04 DIAGNOSIS — R42 Dizziness and giddiness: Secondary | ICD-10-CM

## 2016-01-04 NOTE — Progress Notes (Signed)
Patient ID: Tonya Russell, female   DOB: [birth year], 82 y.o.   MRN: [id number]    Facility  [facility]    Place of Service:   OFFICE    Allergies  Allergen Reactions  . Codeine Nausea And Vomiting  . Lodine [Etodolac] Nausea And Vomiting  . Penicillins     unknown    Chief Complaint  Patient presents with  . Medical Management of Chronic Issues    4 month medication management blood pressure, cholesterol, thyroid, chronic pancreatitis    HPI:  Patient was last seen for an acute visit with complaints of dizziness.. Improved.  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    ASPIRIN 81 MG TABLET    Take one tablet once daily to prevent stroke   ATENOLOL (TENORMIN) 50 MG TABLET    TAKE 1 TABLET DAILY TO CONTROL BLOOD PRESSURE   BIMATOPROST (LUMIGAN) 0.01 % SOLN    Place 1 drop into both eyes at bedtime.   CALCIUM-MAGNESIUM-VITAMIN D [id number] MG-MG-UNIT TABS    Take one tablet once daily for supplement   CETIRIZINE-PSEUDOEPHEDRINE (ZYRTEC-D ALLERGY & CONGESTION) 5-120 MG TABLET    One twice daily to help congestion and dizzy feelings   FLUTICASONE (FLONASE) 50 MCG/ACT NASAL SPRAY    Place 1 spray into both nostrils daily.   GARLIC PO    Take 1 capsule by mouth daily.   GLUCOSAMINE-CHONDROITIN 500-400 MG TABLET    Take 1 tablet by mouth 3 (three) times daily.    IBUPROFEN (ADVIL,MOTRIN) 200 MG TABLET    Take 200 mg by mouth every 6 (six) hours as needed for mild pain.   INULIN (FIBER CHOICE PO)    Take 5 g by mouth 2 (two)  times daily.   LEVOTHYROXINE (SYNTHROID, LEVOTHROID) 50 MCG TABLET    TAKE 1 TABLET DAILY FOR THYROID SUPPLEMENT   MULTIPLE VITAMIN (MULTIVITAMIN WITH MINERALS) TABS TABLET    Take 1 tablet by mouth daily.   OMEGA-3 FATTY ACIDS (FISH OIL) 1200 MG CAPS    Take 1,200 mg by mouth 3 (three) times daily.    OMEPRAZOLE (PRILOSEC) 20 MG CAPSULE    TAKE 1 CAPSULE EVERY DAY  TO  REDUCE  STOMACH  ACID   ONDANSETRON (ZOFRAN ODT) 8 MG DISINTEGRATING TABLET    52m ODT q4 hours prn nausea   OVER THE COUNTER MEDICATION    Take 1 tablet by mouth daily. BONE CARE VITAMIN  Modified Medications   No medications on file  Discontinued Medications   No medications on file    Review of Systems  Constitutional: Negative for fever, chills, activity change, appetite change, fatigue and unexpected weight change.  HENT: Negative for congestion, dental problem, sore throat and trouble swallowing.   Eyes: Negative for visual disturbance.  Respiratory: Negative for apnea, choking, chest tightness and shortness of breath.   Cardiovascular: Negative for chest pain, palpitations and leg swelling.  Gastrointestinal: Negative for abdominal pain, constipation and abdominal distention.       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 S/P endoscopic surgery. Mild  dysphagia and hx of esophageal dysmotility.  Endocrine: Negative.        Hyperglycemia.  Genitourinary: Negative.   Musculoskeletal: Positive for arthralgias. Negative for back pain, neck pain and neck stiffness.  Skin:       Residual lump at the left triceps where she had a SCC removed by Dr. Allyson Sabal 2014. Residual AK of right triceps.  Allergic/Immunologic: Negative.   Neurological: Positive for dizziness and light-headedness. Negative for facial asymmetry and headaches.  Psychiatric/Behavioral: Negative for behavioral problems, confusion and agitation.    Filed Vitals:   01/04/16 1215  BP: 140/80  Pulse: 57  Temp: 97.6 F (36.4 C)    TempSrc: Oral  Height: _0  (1.6 m)  Weight: 164 lb (74.39 kg)  SpO2: 98%   Body mass index is 29.06 kg/(m^2). Filed Weights   01/04/16 1215  Weight: 164 lb (74.39 kg)      Physical Exam  Constitutional: She is oriented to person, place, and time. She appears well-developed and well-nourished. No distress.  HENT:  Head: Normocephalic and atraumatic.  Right Ear: External ear normal.  Left Ear: External ear normal.  Nose: Nose normal.  Mouth/Throat: Oropharynx is clear and moist.  Eyes: Conjunctivae and EOM are normal. Pupils are equal, round, and reactive to light.  Corrective lenses  Neck: No JVD present. No tracheal deviation present. No thyromegaly present.  Cardiovascular: Normal rate, regular rhythm, normal heart sounds and intact distal pulses.  Exam reveals no gallop and no friction rub.   No murmur heard. Pulmonary/Chest: Effort normal and breath sounds normal. No respiratory distress. She has no wheezes. She has no rales. She exhibits no tenderness.  Abdominal: She exhibits no distension and no mass. There is no tenderness.  Musculoskeletal: Normal range of motion. She exhibits no edema or tenderness.  Neurological: She is alert and oriented to person, place, and time. She has normal reflexes. No cranial nerve deficit. Coordination normal.  Skin: No erythema. No pallor.  2 sebaceous cysts of the scalp Lesions on the right and left triceps area. SK left neck. Old ingrown toenail area of the left great toe medially.  Psychiatric: She has a normal mood and affect. Her behavior is normal. Judgment and thought content normal.    Labs reviewed: Lab Summary Latest Ref Rng 09/07/2015 03/09/2015 10/27/2014  Hemoglobin 11.1 - 15.9 g/dL 14.0 (None) (None)  Hematocrit 34.0 - 46.6 % 41.1 (None) (None)  White count 3.4 - 10.8 x10E3/uL 5.1 (None) (None)  Platelet count - (None) (None) (None)  Sodium 134 - 144 mmol/L 143 141 144  Potassium 3.5 - 5.2 mmol/L 4.2 4.0 4.1  Calcium  8.7 - 10.3 mg/dL 9.3 9.4 9.2  Phosphorus - (None) (None) (None)  Creatinine 0.57 - 1.00 mg/dL 0.70 0.80 0.73  AST 0 - 40 IU/L _1 Alk Phos 39 - 117 IU/L 56 49 63  Bilirubin 0.0 - 1.2 mg/dL 0.6 0.5 0.7  Glucose 65 - 99 mg/dL 108(H) 112(H) 96  Cholesterol - (None) (None) (None)  HDL cholesterol >39 mg/dL 80 82 90  Triglycerides 0 - 149 mg/dL 99 75 71  LDL Direct - (None) (None) (None)  LDL Calc 0 - 99 mg/dL 121(H) 121(H) 125(H)  Total protein - (None) (None) (None)  Albumin 3.5 - 4.7 g/dL 4.5 4.2 4.4   Lab Results  Component Value Date   TSH 1.520 09/07/2015   TSH 1.530 03/09/2015   TSH 1.390 10/27/2014   Lab Results  Component Value Date  BUN 13 09/07/2015   BUN 19 03/09/2015   BUN 13 10/27/2014   Lab Results  Component Value Date   HGBA1C 5.8* 06/21/2014   HGBA1C 5.6 12/09/2012    Assessment/Plan  1. Essential hypertension controlled - Comprehensive metabolic panel; Future  2. Hypothyroidism, unspecified hypothyroidism type compensated - TSH; Future  3. Hyperlipidemia controlled - Lipid panel; Future  4. Hyperglycemia controlled - Comprehensive metabolic panel; Future - Microalbumin, urine; Future - Hemoglobin A1c; Future  5. Chronic pancreatitis, unspecified pancreatitis type ([place]) Resolved and asymptomatic  6. 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[place]) resolved  7. Dizzy Stable to improved  7. Dizzy improved

## 2016-01-16 DIAGNOSIS — H40012 Open angle with borderline findings, low risk, left eye: Secondary | ICD-10-CM | POA: Diagnosis not present

## 2016-01-16 DIAGNOSIS — H04123 Dry eye syndrome of bilateral lacrimal glands: Secondary | ICD-10-CM | POA: Diagnosis not present

## 2016-01-16 DIAGNOSIS — H40011 Open angle with borderline findings, low risk, right eye: Secondary | ICD-10-CM | POA: Diagnosis not present

## 2016-01-16 DIAGNOSIS — H1859 Other hereditary corneal dystrophies: Secondary | ICD-10-CM | POA: Diagnosis not present

## 2016-01-17 DIAGNOSIS — L821 Other seborrheic keratosis: Secondary | ICD-10-CM | POA: Diagnosis not present

## 2016-01-17 DIAGNOSIS — L720 Epidermal cyst: Secondary | ICD-10-CM | POA: Diagnosis not present

## 2016-01-17 DIAGNOSIS — M5136 Other intervertebral disc degeneration, lumbar region: Secondary | ICD-10-CM | POA: Diagnosis not present

## 2016-01-17 DIAGNOSIS — M9901 Segmental and somatic dysfunction of cervical region: Secondary | ICD-10-CM | POA: Diagnosis not present

## 2016-01-17 DIAGNOSIS — M9903 Segmental and somatic dysfunction of lumbar region: Secondary | ICD-10-CM | POA: Diagnosis not present

## 2016-01-17 DIAGNOSIS — M9902 Segmental and somatic dysfunction of thoracic region: Secondary | ICD-10-CM | POA: Diagnosis not present

## 2016-02-15 DIAGNOSIS — M50322 Other cervical disc degeneration at C5-C6 level: Secondary | ICD-10-CM | POA: Diagnosis not present

## 2016-02-15 DIAGNOSIS — M9901 Segmental and somatic dysfunction of cervical region: Secondary | ICD-10-CM | POA: Diagnosis not present

## 2016-02-15 DIAGNOSIS — L570 Actinic keratosis: Secondary | ICD-10-CM | POA: Diagnosis not present

## 2016-02-15 DIAGNOSIS — L820 Inflamed seborrheic keratosis: Secondary | ICD-10-CM | POA: Diagnosis not present

## 2016-03-15 DIAGNOSIS — M542 Cervicalgia: Secondary | ICD-10-CM | POA: Diagnosis not present

## 2016-03-15 DIAGNOSIS — M50322 Other cervical disc degeneration at C5-C6 level: Secondary | ICD-10-CM | POA: Diagnosis not present

## 2016-03-15 DIAGNOSIS — M9901 Segmental and somatic dysfunction of cervical region: Secondary | ICD-10-CM | POA: Diagnosis not present

## 2016-04-11 DIAGNOSIS — M9902 Segmental and somatic dysfunction of thoracic region: Secondary | ICD-10-CM | POA: Diagnosis not present

## 2016-04-11 DIAGNOSIS — M9904 Segmental and somatic dysfunction of sacral region: Secondary | ICD-10-CM | POA: Diagnosis not present

## 2016-04-11 DIAGNOSIS — M9903 Segmental and somatic dysfunction of lumbar region: Secondary | ICD-10-CM | POA: Diagnosis not present

## 2016-04-11 DIAGNOSIS — M5136 Other intervertebral disc degeneration, lumbar region: Secondary | ICD-10-CM | POA: Diagnosis not present

## 2016-05-02 NOTE — Addendum Note (Signed)
Addended by: Moshe Cipro MESHELL A on: 05/02/2016 02:25 PM   Modules accepted: Orders

## 2016-05-16 DIAGNOSIS — M5136 Other intervertebral disc degeneration, lumbar region: Secondary | ICD-10-CM | POA: Diagnosis not present

## 2016-05-16 DIAGNOSIS — M9902 Segmental and somatic dysfunction of thoracic region: Secondary | ICD-10-CM | POA: Diagnosis not present

## 2016-05-16 DIAGNOSIS — M9904 Segmental and somatic dysfunction of sacral region: Secondary | ICD-10-CM | POA: Diagnosis not present

## 2016-05-16 DIAGNOSIS — M9903 Segmental and somatic dysfunction of lumbar region: Secondary | ICD-10-CM | POA: Diagnosis not present

## 2016-06-12 DIAGNOSIS — M9902 Segmental and somatic dysfunction of thoracic region: Secondary | ICD-10-CM | POA: Diagnosis not present

## 2016-06-12 DIAGNOSIS — M5134 Other intervertebral disc degeneration, thoracic region: Secondary | ICD-10-CM | POA: Diagnosis not present

## 2016-06-12 DIAGNOSIS — M9901 Segmental and somatic dysfunction of cervical region: Secondary | ICD-10-CM | POA: Diagnosis not present

## 2016-06-12 DIAGNOSIS — M5033 Other cervical disc degeneration, cervicothoracic region: Secondary | ICD-10-CM | POA: Diagnosis not present

## 2016-06-26 ENCOUNTER — Telehealth: Payer: Self-pay | Admitting: Internal Medicine

## 2016-07-02 ENCOUNTER — Other Ambulatory Visit: Payer: Medicare Other

## 2016-07-02 DIAGNOSIS — E785 Hyperlipidemia, unspecified: Secondary | ICD-10-CM | POA: Diagnosis not present

## 2016-07-02 DIAGNOSIS — E039 Hypothyroidism, unspecified: Secondary | ICD-10-CM

## 2016-07-02 DIAGNOSIS — R739 Hyperglycemia, unspecified: Secondary | ICD-10-CM | POA: Diagnosis not present

## 2016-07-02 DIAGNOSIS — I10 Essential (primary) hypertension: Secondary | ICD-10-CM

## 2016-07-02 LAB — COMPREHENSIVE METABOLIC PANEL
ALT: 15 U/L (ref 6–29)
AST: 19 U/L (ref 10–35)
Albumin: 3.9 g/dL (ref 3.6–5.1)
Alkaline Phosphatase: 38 U/L (ref 33–130)
BUN: 14 mg/dL (ref 7–25)
CALCIUM: 9.3 mg/dL (ref 8.6–10.4)
CO2: 28 mmol/L (ref 20–31)
Chloride: 107 mmol/L (ref 98–110)
Creat: 0.7 mg/dL (ref 0.60–0.88)
GLUCOSE: 106 mg/dL — AB (ref 65–99)
POTASSIUM: 4.3 mmol/L (ref 3.5–5.3)
Sodium: 141 mmol/L (ref 135–146)
Total Bilirubin: 0.7 mg/dL (ref 0.2–1.2)
Total Protein: 6.2 g/dL (ref 6.1–8.1)

## 2016-07-02 LAB — LIPID PANEL
CHOL/HDL RATIO: 3.2 ratio (ref <=5.0)
CHOLESTEROL: 231 mg/dL — AB (ref 125–200)
HDL: 72 mg/dL (ref >=46)
LDL Cholesterol: 142 mg/dL — ABNORMAL HIGH (ref <130)
TRIGLYCERIDES: 85 mg/dL (ref <150)
VLDL: 17 mg/dL (ref <30)

## 2016-07-02 LAB — TSH: TSH: 1.81 mIU/L

## 2016-07-03 LAB — HEMOGLOBIN A1C
Hgb A1c MFr Bld: 5.3 % (ref <5.7)
Mean Plasma Glucose: 105 mg/dL

## 2016-07-03 LAB — MICROALBUMIN, URINE: Microalb, Ur: 0.6 mg/dL

## 2016-07-04 ENCOUNTER — Encounter: Payer: Self-pay | Admitting: Internal Medicine

## 2016-07-04 ENCOUNTER — Ambulatory Visit (INDEPENDENT_AMBULATORY_CARE_PROVIDER_SITE_OTHER): Payer: Medicare Other | Admitting: Internal Medicine

## 2016-07-04 VITALS — BP 138/70 | HR 54 | Temp 97.6°F | Ht 64.0 in | Wt 158.0 lb

## 2016-07-04 VITALS — BP 138/70 | HR 54 | Temp 97.6°F | Ht 63.0 in | Wt 158.0 lb

## 2016-07-04 DIAGNOSIS — I10 Essential (primary) hypertension: Secondary | ICD-10-CM | POA: Diagnosis not present

## 2016-07-04 DIAGNOSIS — R739 Hyperglycemia, unspecified: Secondary | ICD-10-CM | POA: Diagnosis not present

## 2016-07-04 DIAGNOSIS — Z0000 Encounter for general adult medical examination without abnormal findings: Secondary | ICD-10-CM

## 2016-07-04 DIAGNOSIS — E039 Hypothyroidism, unspecified: Secondary | ICD-10-CM

## 2016-07-04 DIAGNOSIS — E785 Hyperlipidemia, unspecified: Secondary | ICD-10-CM | POA: Diagnosis not present

## 2016-07-04 DIAGNOSIS — I2510 Atherosclerotic heart disease of native coronary artery without angina pectoris: Secondary | ICD-10-CM | POA: Diagnosis not present

## 2016-07-04 DIAGNOSIS — C241 Malignant neoplasm of ampulla of Vater: Secondary | ICD-10-CM

## 2016-07-04 DIAGNOSIS — L84 Corns and callosities: Secondary | ICD-10-CM

## 2016-07-04 DIAGNOSIS — E669 Obesity, unspecified: Secondary | ICD-10-CM | POA: Diagnosis not present

## 2016-07-04 DIAGNOSIS — R1310 Dysphagia, unspecified: Secondary | ICD-10-CM | POA: Diagnosis not present

## 2016-07-04 DIAGNOSIS — Z23 Encounter for immunization: Secondary | ICD-10-CM | POA: Diagnosis not present

## 2016-07-04 MED ORDER — LEVOTHYROXINE SODIUM 50 MCG PO TABS: ORAL_TABLET | ORAL | 1 refills | Status: DC

## 2016-07-04 MED ORDER — METOPROLOL TARTRATE 50 MG PO TABS: ORAL_TABLET | ORAL | 3 refills | Status: DC

## 2016-07-04 NOTE — Progress Notes (Signed)
Facility  [facility]    Place of Service:   OFFICE    Allergies  Allergen Reactions  . Codeine Nausea And Vomiting  . Lodine [Etodolac] Nausea And Vomiting  . Penicillins     unknown    Chief Complaint  Patient presents with  . Medical Management of Chronic Issues    6 month medication management blood pressure, thyroid, cholesterol, review labs  . MMSE    30/30 passed clock drawing  . Sleeping Problem    can get to sleep, but if she wakes up in the middle of the night she has trouble going back to sleep.     HPI:  Earache on the right started yesterday.  Essential hypertension - atenolol no longer available. Needs alternate per [facility].  Hypothyroidism, unspecified type - compensated  Hyperglycemia - controlled  Hyperlipidemia, unspecified hyperlipidemia type  Slightly high LDL, bot HDL is also high at 72  Atherosclerosis of native coronary artery of native heart without angina pectoris - denies chest pain or palpitations  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) - no eveidence of relapse. No nausea. WQeight loss intentional.  Mildly obese - continue to lose weight  Dysphagia, unspecified type - improved  Corn of foot - sole of right foot at 1st mt-p joint    Medications: Patient's Medications  New Prescriptions   No medications on file  Previous Medications   ALPRAZOLAM (XANAX) 0.5 MG TABLET    Take 0.25 mg by mouth 3 (three) times daily as needed for anxiety.    ASPIRIN 81 MG TABLET    Take one tablet once daily to prevent stroke   ATENOLOL (TENORMIN) 50 MG TABLET    TAKE 1 TABLET DAILY TO CONTROL BLOOD PRESSURE   BIMATOPROST (LUMIGAN) 0.01 % SOLN    Place 1 drop into both eyes at bedtime.   CALCIUM-MAGNESIUM-VITAMIN D [id number] MG-MG-UNIT TABS    Take one tablet once daily for supplement   FLUTICASONE (FLONASE) 50 MCG/ACT NASAL SPRAY    Place 1 spray into both nostrils daily.   GARLIC PO    Take 1 capsule by mouth daily.   GLUCOSAMINE-CHONDROITIN  500-400 MG TABLET    Take 1 tablet by mouth 3 (three) times daily.    IBUPROFEN (ADVIL,MOTRIN) 200 MG TABLET    Take 200 mg by mouth every 6 (six) hours as needed for mild pain.   INULIN (FIBER CHOICE PO)    Take 5 g by mouth 2 (two) times daily.   LEVOTHYROXINE (SYNTHROID, LEVOTHROID) 50 MCG TABLET    TAKE 1 TABLET DAILY FOR THYROID SUPPLEMENT   MULTIPLE VITAMIN (MULTIVITAMIN WITH MINERALS) TABS TABLET    Take 1 tablet by mouth daily.   OMEGA-3 FATTY ACIDS (FISH OIL) 1200 MG CAPS    Take 1,200 mg by mouth 3 (three) times daily.    OMEPRAZOLE (PRILOSEC) 20 MG CAPSULE    TAKE 1 CAPSULE EVERY DAY  TO  REDUCE  STOMACH  ACID   OVER THE COUNTER MEDICATION    Take 1 tablet by mouth daily. BONE CARE VITAMIN  Modified Medications   No medications on file  Discontinued Medications   No medications on file    Review of Systems  Constitutional: Negative for activity change, appetite change, chills, fatigue, fever and unexpected weight change.  HENT: Positive for ear pain (right). Negative for congestion, dental problem, sore throat and trouble swallowing.   Eyes: Negative for visual disturbance.  Respiratory: Negative for apnea, choking, chest tightness and  shortness of breath.   Cardiovascular: Negative for chest pain, palpitations and leg swelling.  Gastrointestinal: Negative for abdominal distention, abdominal pain and constipation.       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 S/P endoscopic surgery. Mild dysphagia and hx of esophageal dysmotility.  Endocrine: Negative.        Hyperglycemia.  Genitourinary: Negative.   Musculoskeletal: Positive for arthralgias. Negative for back pain, neck pain and neck stiffness.  Skin:       Residual lump at the left triceps where she had a SCC removed by Dr. Allyson Sabal 2014. Residual AK of right triceps. Painful corn on the sole of the right foot  Allergic/Immunologic: Negative.   Neurological: Positive for dizziness and light-headedness.  Negative for facial asymmetry and headaches.  Psychiatric/Behavioral: Negative for agitation, behavioral problems and confusion.    Vitals:   07/04/16 1213  BP: 138/70  Pulse: (!) 54  Temp: 97.6 F (36.4 C)  TempSrc: Oral  SpO2: 99%  Weight: 158 lb (71.7 kg)  Height: 5' 4" (1.626 m)   Body mass index is 27.12 kg/m. Wt Readings from Last 3 Encounters:  07/04/16 158 lb (71.7 kg)  07/04/16 158 lb 0.4 oz (71.7 kg)  01/04/16 164 lb (74.4 kg)      Physical Exam  Constitutional: She is oriented to person, place, and time. She appears well-developed and well-nourished. No distress.  HENT:  Head: Normocephalic and atraumatic.  Right Ear: External ear normal.  Left Ear: External ear normal.  Nose: Nose normal.  Mouth/Throat: Oropharynx is clear and moist.  Eyes: Conjunctivae and EOM are normal. Pupils are equal, round, and reactive to light.  Corrective lenses  Neck: No JVD present. No tracheal deviation present. No thyromegaly present.  Cardiovascular: Normal rate, regular rhythm, normal heart sounds and intact distal pulses.  Exam reveals no gallop and no friction rub.   No murmur heard. Pulmonary/Chest: Effort normal and breath sounds normal. No respiratory distress. She has no wheezes. She has no rales. She exhibits no tenderness.  Abdominal: She exhibits no distension and no mass. There is no tenderness.  Musculoskeletal: Normal range of motion. She exhibits no edema or tenderness.  Neurological: She is alert and oriented to person, place, and time. She has normal reflexes. No cranial nerve deficit. Coordination normal.  Skin: No erythema. No pallor.  2 sebaceous cysts of the scalp Lesions on the right and left triceps area. SK left neck. Old ingrown toenail area of the left great toe medially. Painful corn on the sole of the right foot at the 1st MTP joint.  Psychiatric: She has a normal mood and affect. Her behavior is normal. Judgment and thought content normal.     Labs reviewed: Lab Summary Latest Ref Rng & Units 07/02/2016 09/07/2015 03/09/2015  Hemoglobin 11.1 - 15.9 g/dL (None) 14.0 (None)  Hematocrit 34.0 - 46.6 % (None) 41.1 (None)  White count 3.4 - 10.8 x10E3/uL (None) 5.1 (None)  Platelet count - (None) (None) (None)  Sodium 135 - 146 mmol/L 141 143 141  Potassium 3.5 - 5.3 mmol/L 4.3 4.2 4.0  Calcium 8.6 - 10.4 mg/dL 9.3 9.3 9.4  Phosphorus - (None) (None) (None)  Creatinine 0.60 - 0.88 mg/dL 0.70 0.70 0.80  AST 10 - 35 U/L _0 Alk Phos 33 - 130 U/L 38 56 49  Bilirubin 0.2 - 1.2 mg/dL 0.7 0.6 0.5  Glucose 65 - 99 mg/dL 106(H) 108(H) 112(H)  Cholesterol 125 - 200 mg/dL 231(H) (None) (  None)  HDL cholesterol >=46 mg/dL 72 80 82  Triglycerides <150 mg/dL 85 99 75  LDL Direct - (None) (None) (None)  LDL Calc <130 mg/dL 142(H) 121(H) 121(H)  Total protein 6.1 - 8.1 g/dL 6.2 (None) (None)  Albumin 3.6 - 5.1 g/dL 3.9 4.5 4.2  Some recent data might be hidden   Lab Results  Component Value Date   TSH 1.81 07/02/2016   TSH 1.520 09/07/2015   TSH 1.530 03/09/2015   Lab Results  Component Value Date   BUN 14 07/02/2016   BUN 13 09/07/2015   BUN 19 03/09/2015   Lab Results  Component Value Date   HGBA1C 5.3 07/02/2016   HGBA1C 5.8 (H) 06/21/2014   HGBA1C 5.6 12/09/2012    Assessment/Plan  1. Essential hypertension -stop atenolol - metoprolol (LOPRESSOR) 50 MG tablet; One tablet daily to control BP  Dispense: 90 tablet; Refill: 3 - Comprehensive metabolic panel; Future  2. Hypothyroidism, unspecified type - levothyroxine (SYNTHROID, LEVOTHROID) 50 MCG tablet; TAKE 1 TABLET DAILY FOR THYROID SUPPLEMENT  Dispense: 90 tablet; Refill: 1 - TSH; Future  3. Hyperglycemia - Hemoglobin A1c; Future - Microalbumin, urine; Future  4. Hyperlipidemia, unspecified hyperlipidemia type - Lipid panel; Future  5. Atherosclerosis of native coronary artery of native heart without angina pectoris stable  6. Malignant neoplasm of  ampulla of Vater ([place]) In remission  7. Mildly obese Continue weight loss  8. Dysphagia, unspecified type improved  9. Corn of foot -sharply debrided without anesthesia. Pain improved on weight bearing

## 2016-07-04 NOTE — Patient Instructions (Signed)
Tonya Russell , Thank you for taking time to come for your Medicare Wellness Visit. I appreciate your ongoing commitment to your health goals. Please review the following plan we discussed and let me know if I can assist you in the future.   These are the goals we discussed: Goals    . Exercise More          Starting 07/04/16, I will attempt to exercise at least twice a week, walking or using my pedaling machine.        This is a list of the screening recommended for you and due dates:  Health Maintenance  Topic Date Due  . DEXA scan (bone density measurement)  10/15/1991  . Mammogram  10/30/2016  . Tetanus Vaccine  09/10/2017  . Flu Shot  Completed  . Shingles Vaccine  Completed  . Pneumonia vaccines  Completed  Preventive Care for Adults  A healthy lifestyle and preventive care can promote health and wellness. Preventive health guidelines for adults include the following key practices.  . A routine yearly physical is a good way to check with your health care provider about your health and preventive screening. It is a chance to share any concerns and updates on your health and to receive a thorough exam.  . Visit your dentist for a routine exam and preventive care every 6 months. Brush your teeth twice a day and floss once a day. Good oral hygiene prevents tooth decay and gum disease.  . The frequency of eye exams is based on your age, health, family medical history, use  of contact lenses, and other factors. Follow your health care provider's ecommendations for frequency of eye exams.  . Eat a healthy diet. Foods like vegetables, fruits, whole grains, low-fat dairy products, and lean protein foods contain the nutrients you need without too many calories. Decrease your intake of foods high in solid fats, added sugars, and salt. Eat the right amount of calories for you. Get information about a proper diet from your health care provider, if necessary.  . Regular physical exercise is  one of the most important things you can do for your health. Most adults should get at least 150 minutes of moderate-intensity exercise (any activity that increases your heart rate and causes you to sweat) each week. In addition, most adults need muscle-strengthening exercises on 2 or more days a week.  Silver Sneakers may be a benefit available to you. To determine eligibility, you may visit the website: www.silversneakers.com or contact program at 6074903730 Mon-Fri between 8AM-8PM.   . Maintain a healthy weight. The body mass index (BMI) is a screening tool to identify possible weight problems. It provides an estimate of body fat based on height and weight. Your health care provider can find your BMI and can help you achieve or maintain a healthy weight.   For adults 20 years and older: ? A BMI below 18.5 is considered underweight. ? A BMI of 18.5 to 24.9 is normal. ? A BMI of 25 to 29.9 is considered overweight. ? A BMI of 30 and above is considered obese.   . Maintain normal blood lipids and cholesterol levels by exercising and minimizing your intake of saturated fat. Eat a balanced diet with plenty of fruit and vegetables. Blood tests for lipids and cholesterol should begin at age 13 and be repeated every 5 years. If your lipid or cholesterol levels are high, you are over 50, or you are at high risk for heart disease, you  may need your cholesterol levels checked more frequently. Ongoing high lipid and cholesterol levels should be treated with medicines if diet and exercise are not working.  . If you smoke, find out from your health care provider how to quit. If you do not use tobacco, please do not start.  . If you choose to drink alcohol, please do not consume more than 2 drinks per day. One drink is considered to be 12 ounces (355 mL) of beer, 5 ounces (148 mL) of wine, or 1.5 ounces (44 mL) of liquor.  . If you are 5-85 years old, ask your health care provider if you should take  aspirin to prevent strokes.  . Use sunscreen. Apply sunscreen liberally and repeatedly throughout the day. You should seek shade when your shadow is shorter than you. Protect yourself by wearing long sleeves, pants, a wide-brimmed hat, and sunglasses year round, whenever you are outdoors.  . Once a month, do a whole body skin exam, using a mirror to look at the skin on your back. Tell your health care provider of new moles, moles that have irregular borders, moles that are larger than a pencil eraser, or moles that have changed in shape or color.

## 2016-07-04 NOTE — Telephone Encounter (Signed)
error 

## 2016-07-04 NOTE — Progress Notes (Addendum)
Subjective:   Tonya Russell is a 82 y.o. female who presents for an Initial Medicare Annual Wellness Visit.  Review of Systems     Cardiac Risk Factors include: advanced age (>26men, >24 women);hypertension;sedentary lifestyle     Objective:    There were no vitals filed for this visit. There is no height or weight on file to calculate BMI.   Current Medications (verified) Outpatient Encounter Prescriptions as of 07/04/2016  Medication Sig  . ALPRAZolam (XANAX) 0.5 MG tablet Take 0.25 mg by mouth 3 (three) times daily as needed for anxiety.   Marland Kitchen aspirin 81 MG tablet Take one tablet once daily to prevent stroke  . atenolol (TENORMIN) 50 MG tablet TAKE 1 TABLET DAILY TO CONTROL BLOOD PRESSURE  . bimatoprost (LUMIGAN) 0.01 % SOLN Place 1 drop into both eyes at bedtime.  . Calcium-Magnesium-Vitamin D [id number] MG-MG-UNIT TABS Take one tablet once daily for supplement  . fluticasone (FLONASE) 50 MCG/ACT nasal spray Place 1 spray into both nostrils daily.  Marland Kitchen GARLIC PO Take 1 capsule by mouth daily.  Marland Kitchen glucosamine-chondroitin 500-400 MG tablet Take 1 tablet by mouth 3 (three) times daily.   Marland Kitchen ibuprofen (ADVIL,MOTRIN) 200 MG tablet Take 200 mg by mouth every 6 (six) hours as needed for mild pain.  . Inulin (FIBER CHOICE PO) Take 5 g by mouth 2 (two) times daily.  Marland Kitchen levothyroxine (SYNTHROID, LEVOTHROID) 50 MCG tablet TAKE 1 TABLET DAILY FOR THYROID SUPPLEMENT  . Multiple Vitamin (MULTIVITAMIN WITH MINERALS) TABS tablet Take 1 tablet by mouth daily.  . Omega-3 Fatty Acids (FISH OIL) 1200 MG CAPS Take 1,200 mg by mouth 3 (three) times daily.   Marland Kitchen omeprazole (PRILOSEC) 20 MG capsule TAKE 1 CAPSULE EVERY DAY  TO  REDUCE  STOMACH  ACID  . OVER THE COUNTER MEDICATION Take 1 tablet by mouth daily. BONE CARE VITAMIN  . [DISCONTINUED] cetirizine-pseudoephedrine (ZYRTEC-D ALLERGY & CONGESTION) 5-120 MG tablet One twice daily to help congestion and dizzy feelings  . [DISCONTINUED] ondansetron  (ZOFRAN ODT) 8 MG disintegrating tablet 8mg  ODT q4 hours prn nausea   No facility-administered encounter medications on file as of 07/04/2016.     Allergies (verified) Codeine; Lodine [etodolac]; and Penicillins   History: Past Medical History:  Diagnosis Date  . Abdominal pain, epigastric   . Allergic rhinitis due to other allergen   . Allergic rhinitis, cause unspecified   . Dermatophytosis of nail   . Diffuse cystic mastopathy   . Diverticulosis of colon (without mention of hemorrhage)   . Dysphagia, unspecified(787.20)   . Esophageal reflux   . Family history of malignant neoplasm of gastrointestinal tract   . Hiatal hernia   . Hyperlipidemia   . Hypertension   . Internal hemorrhoids without mention of complication   . 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 Obesity   . Oral aphthae   . Osteoarthrosis, unspecified whether generalized or localized, unspecified site   . Other abnormal blood chemistry   . Other voice and resonance disorders   . Pain in joint, pelvic region and thigh   . Palpitations   . Pancreatitis   . Peripheral vascular disease, unspecified   . Schatzki's ring   . Senile osteoporosis   . Torus fracture of fibula   . Unspecified constipation   . Unspecified hypothyroidism    Past Surgical History:  Procedure Laterality Date  . APPENDECTOMY    . BASAL CELL CARCINOMA EXCISION     right humerus, right temple,  left side of nose  . BASAL CELL CARCINOMA EXCISION     chest, left cheek  . CATARACT EXTRACTION, BILATERAL    . CHOLECYSTECTOMY    . keratosis     left arm  . resection of ampullary tumor    . squamous cell cancer     Right leg  . squamous cell cancer left triceps Left    Dr. Lorenza Cambridge  . TONSILLECTOMY AND ADENOIDECTOMY     Family History  Problem Relation Age of Onset  . Cancer Mother     spindle cell carcinoma on neck  . Colon cancer Father 92  . Breast cancer Sister   . Colon polyps Sister   . Heart disease Brother     . Breast cancer      neiece x 3  . Esophageal cancer Neg Hx   . Pancreatic cancer Neg Hx   . Kidney disease Neg Hx   . Liver disease Neg Hx    Social History   Occupational History  . Retired    Social History Main Topics  . Smoking status: Never Smoker  . Smokeless tobacco: Never Used  . Alcohol use No  . Drug use: No  . Sexual activity: No    Tobacco Counseling Counseling given: Not Answered   Activities of Daily Living In your present state of health, do you have any difficulty performing the following activities: 07/04/2016  Hearing? N  Vision? N  Difficulty concentrating or making decisions? N  Walking or climbing stairs? Y  Dressing or bathing? N  Doing errands, shopping? N  Preparing Food and eating ? N  Using the Toilet? N  In the past six months, have you accidently leaked urine? Y  Do you have problems with loss of bowel control? N  Managing your Medications? N  Managing your Finances? N  Housekeeping or managing your Housekeeping? N  Some recent data might be hidden    Immunizations and Health Maintenance Immunization History  Administered Date(s) Administered  . Influenza,inj,Quad PF,36+ Mos 06/17/2013, 06/23/2014, 09/07/2015, 07/04/2016  . Influenza-Unspecified 07/09/2012  . Pneumococcal Polysaccharide-23 07/04/2016  . Pneumococcal-Unspecified 09/10/1992  . Tdap 09/11/2007  . Zoster 11/28/2010   Health Maintenance Due  Topic Date Due  . DEXA SCAN  10/15/1991    Patient Care Team: Estill Dooms, MD as PCP - General (Internal Medicine) [name], MD as Consulting Physician (Gastroenterology) Lelon Perla, MD as Consulting Physician (Cardiology) Druscilla Brownie, MD as Consulting Physician (Dermatology) [name], MD as Consulting Physician (Ophthalmology)  Indicate any recent Medical Services you may have received from other than [facility] providers in the past year (date may be approximate).     Assessment:   This is a routine  wellness examination for [facility].   Hearing/Vision screen Hearing Screening Comments: Unsure of last hearing screen. Vision Screening Comments: Next eye exam is Dec.13, 2017.  Dietary issues and exercise activities discussed: Current Exercise Habits: The patient does not participate in regular exercise at present, Exercise limited by: None identified  Goals    . Exercise More          Starting 07/04/16, I will attempt to exercise at least twice a week, walking or using my pedaling machine.       Depression Screen PHQ 2/9 Scores 07/04/2016 06/17/2013 12/17/2012  PHQ - 2 Score 0 0 0    Fall Risk Fall Risk  07/04/2016 01/04/2016 10/12/2015 09/07/2015 03/09/2015  Falls in the past year? No No No No  No    Cognitive Function: MMSE - Mini Mental State Exam 07/04/2016  Orientation to time 5  Orientation to Place 5  Registration 3  Attention/ Calculation 5  Recall 3  Language- name 2 objects 2  Language- repeat 1  Language- follow 3 step command 3  Language- read & follow direction 1  Write a sentence 1  Copy design 1  Total score 30        Screening Tests Health Maintenance  Topic Date Due  . DEXA SCAN  10/15/1991  . MAMMOGRAM  10/30/2016  . TETANUS/TDAP  09/10/2017  . INFLUENZA VACCINE  Completed  . ZOSTAVAX  Completed  . PNA vac Low Risk Adult  Completed      Plan:  I have personally reviewed and addressed the Medicare Annual Wellness questionnaire and have noted the following in the patient's chart:  A. Medical and social history B. Use of alcohol, tobacco or illicit drugs  C. Current medications and supplements D. Functional ability and status E.  Nutritional status F.  Physical activity G. Advance directives H. List of other physicians I.  Hospitalizations, surgeries, and ER visits in previous 12 months J.  [place] to include hearing, vision, cognitive, depression L. Referrals and appointments - none  In addition, I have reviewed and discussed  with patient certain preventive protocols, quality metrics, and best practice recommendations. A written personalized care plan for preventive services as well as general preventive health recommendations were provided to patient.  See attached scanned questionnaire for additional information.   Signed,   Allyn Kenner, LPN Health Advisor  I have reviewed the information entered by the Health Advisor. I was present in the office during the time of patient interaction and was available for consultation. I agree with the documentation and advice.  Viviann Spare Nyoka Cowden, MD

## 2016-07-09 DIAGNOSIS — M9901 Segmental and somatic dysfunction of cervical region: Secondary | ICD-10-CM | POA: Diagnosis not present

## 2016-07-09 DIAGNOSIS — M5134 Other intervertebral disc degeneration, thoracic region: Secondary | ICD-10-CM | POA: Diagnosis not present

## 2016-07-09 DIAGNOSIS — M9902 Segmental and somatic dysfunction of thoracic region: Secondary | ICD-10-CM | POA: Diagnosis not present

## 2016-07-09 DIAGNOSIS — M5033 Other cervical disc degeneration, cervicothoracic region: Secondary | ICD-10-CM | POA: Diagnosis not present

## 2016-07-25 ENCOUNTER — Other Ambulatory Visit: Payer: Self-pay | Admitting: *Deleted

## 2016-07-25 MED ORDER — OMEPRAZOLE 20 MG PO CPDR: DELAYED_RELEASE_CAPSULE | ORAL | 3 refills | Status: DC

## 2016-07-25 NOTE — Telephone Encounter (Signed)
[facility] 

## 2016-07-27 ENCOUNTER — Other Ambulatory Visit: Payer: Self-pay

## 2016-08-06 DIAGNOSIS — M5136 Other intervertebral disc degeneration, lumbar region: Secondary | ICD-10-CM | POA: Diagnosis not present

## 2016-08-06 DIAGNOSIS — M9904 Segmental and somatic dysfunction of sacral region: Secondary | ICD-10-CM | POA: Diagnosis not present

## 2016-08-06 DIAGNOSIS — M9903 Segmental and somatic dysfunction of lumbar region: Secondary | ICD-10-CM | POA: Diagnosis not present

## 2016-08-06 DIAGNOSIS — M9905 Segmental and somatic dysfunction of pelvic region: Secondary | ICD-10-CM | POA: Diagnosis not present

## 2016-08-15 DIAGNOSIS — L720 Epidermal cyst: Secondary | ICD-10-CM | POA: Diagnosis not present

## 2016-08-15 DIAGNOSIS — L570 Actinic keratosis: Secondary | ICD-10-CM | POA: Diagnosis not present

## 2016-08-22 DIAGNOSIS — H40012 Open angle with borderline findings, low risk, left eye: Secondary | ICD-10-CM | POA: Diagnosis not present

## 2016-08-22 DIAGNOSIS — H52203 Unspecified astigmatism, bilateral: Secondary | ICD-10-CM | POA: Diagnosis not present

## 2016-08-22 DIAGNOSIS — H40011 Open angle with borderline findings, low risk, right eye: Secondary | ICD-10-CM | POA: Diagnosis not present

## 2016-08-22 DIAGNOSIS — H1859 Other hereditary corneal dystrophies: Secondary | ICD-10-CM | POA: Diagnosis not present

## 2016-08-30 DIAGNOSIS — M5136 Other intervertebral disc degeneration, lumbar region: Secondary | ICD-10-CM | POA: Diagnosis not present

## 2016-08-30 DIAGNOSIS — M9903 Segmental and somatic dysfunction of lumbar region: Secondary | ICD-10-CM | POA: Diagnosis not present

## 2016-08-30 DIAGNOSIS — M9904 Segmental and somatic dysfunction of sacral region: Secondary | ICD-10-CM | POA: Diagnosis not present

## 2016-08-30 DIAGNOSIS — M9905 Segmental and somatic dysfunction of pelvic region: Secondary | ICD-10-CM | POA: Diagnosis not present

## 2016-09-24 DIAGNOSIS — M9901 Segmental and somatic dysfunction of cervical region: Secondary | ICD-10-CM | POA: Diagnosis not present

## 2016-09-24 DIAGNOSIS — M5031 Other cervical disc degeneration,  high cervical region: Secondary | ICD-10-CM | POA: Diagnosis not present

## 2016-10-25 DIAGNOSIS — M9903 Segmental and somatic dysfunction of lumbar region: Secondary | ICD-10-CM | POA: Diagnosis not present

## 2016-10-25 DIAGNOSIS — M9904 Segmental and somatic dysfunction of sacral region: Secondary | ICD-10-CM | POA: Diagnosis not present

## 2016-10-25 DIAGNOSIS — M5136 Other intervertebral disc degeneration, lumbar region: Secondary | ICD-10-CM | POA: Diagnosis not present

## 2016-10-25 DIAGNOSIS — M9905 Segmental and somatic dysfunction of pelvic region: Secondary | ICD-10-CM | POA: Diagnosis not present

## 2016-10-31 DIAGNOSIS — M9903 Segmental and somatic dysfunction of lumbar region: Secondary | ICD-10-CM | POA: Diagnosis not present

## 2016-10-31 DIAGNOSIS — M9904 Segmental and somatic dysfunction of sacral region: Secondary | ICD-10-CM | POA: Diagnosis not present

## 2016-10-31 DIAGNOSIS — M9905 Segmental and somatic dysfunction of pelvic region: Secondary | ICD-10-CM | POA: Diagnosis not present

## 2016-10-31 DIAGNOSIS — M5136 Other intervertebral disc degeneration, lumbar region: Secondary | ICD-10-CM | POA: Diagnosis not present

## 2016-11-05 DIAGNOSIS — M9904 Segmental and somatic dysfunction of sacral region: Secondary | ICD-10-CM | POA: Diagnosis not present

## 2016-11-05 DIAGNOSIS — M9903 Segmental and somatic dysfunction of lumbar region: Secondary | ICD-10-CM | POA: Diagnosis not present

## 2016-11-05 DIAGNOSIS — M9905 Segmental and somatic dysfunction of pelvic region: Secondary | ICD-10-CM | POA: Diagnosis not present

## 2016-11-05 DIAGNOSIS — M5136 Other intervertebral disc degeneration, lumbar region: Secondary | ICD-10-CM | POA: Diagnosis not present

## 2016-11-14 DIAGNOSIS — M5136 Other intervertebral disc degeneration, lumbar region: Secondary | ICD-10-CM | POA: Diagnosis not present

## 2016-11-14 DIAGNOSIS — M9905 Segmental and somatic dysfunction of pelvic region: Secondary | ICD-10-CM | POA: Diagnosis not present

## 2016-11-14 DIAGNOSIS — M9904 Segmental and somatic dysfunction of sacral region: Secondary | ICD-10-CM | POA: Diagnosis not present

## 2016-11-14 DIAGNOSIS — M9903 Segmental and somatic dysfunction of lumbar region: Secondary | ICD-10-CM | POA: Diagnosis not present

## 2016-11-22 DIAGNOSIS — M9903 Segmental and somatic dysfunction of lumbar region: Secondary | ICD-10-CM | POA: Diagnosis not present

## 2016-11-22 DIAGNOSIS — M9904 Segmental and somatic dysfunction of sacral region: Secondary | ICD-10-CM | POA: Diagnosis not present

## 2016-11-22 DIAGNOSIS — M5136 Other intervertebral disc degeneration, lumbar region: Secondary | ICD-10-CM | POA: Diagnosis not present

## 2016-11-22 DIAGNOSIS — M9905 Segmental and somatic dysfunction of pelvic region: Secondary | ICD-10-CM | POA: Diagnosis not present

## 2016-11-26 ENCOUNTER — Other Ambulatory Visit: Payer: Self-pay | Admitting: Internal Medicine

## 2016-11-26 DIAGNOSIS — E039 Hypothyroidism, unspecified: Secondary | ICD-10-CM

## 2016-11-27 DIAGNOSIS — M9904 Segmental and somatic dysfunction of sacral region: Secondary | ICD-10-CM | POA: Diagnosis not present

## 2016-11-27 DIAGNOSIS — M9903 Segmental and somatic dysfunction of lumbar region: Secondary | ICD-10-CM | POA: Diagnosis not present

## 2016-11-27 DIAGNOSIS — M5136 Other intervertebral disc degeneration, lumbar region: Secondary | ICD-10-CM | POA: Diagnosis not present

## 2016-11-27 DIAGNOSIS — M9905 Segmental and somatic dysfunction of pelvic region: Secondary | ICD-10-CM | POA: Diagnosis not present

## 2016-12-03 DIAGNOSIS — M9904 Segmental and somatic dysfunction of sacral region: Secondary | ICD-10-CM | POA: Diagnosis not present

## 2016-12-03 DIAGNOSIS — M9905 Segmental and somatic dysfunction of pelvic region: Secondary | ICD-10-CM | POA: Diagnosis not present

## 2016-12-03 DIAGNOSIS — M9903 Segmental and somatic dysfunction of lumbar region: Secondary | ICD-10-CM | POA: Diagnosis not present

## 2016-12-03 DIAGNOSIS — M5136 Other intervertebral disc degeneration, lumbar region: Secondary | ICD-10-CM | POA: Diagnosis not present

## 2016-12-10 DIAGNOSIS — M5136 Other intervertebral disc degeneration, lumbar region: Secondary | ICD-10-CM | POA: Diagnosis not present

## 2016-12-10 DIAGNOSIS — M9905 Segmental and somatic dysfunction of pelvic region: Secondary | ICD-10-CM | POA: Diagnosis not present

## 2016-12-10 DIAGNOSIS — M9904 Segmental and somatic dysfunction of sacral region: Secondary | ICD-10-CM | POA: Diagnosis not present

## 2016-12-10 DIAGNOSIS — M9903 Segmental and somatic dysfunction of lumbar region: Secondary | ICD-10-CM | POA: Diagnosis not present

## 2016-12-17 DIAGNOSIS — M9903 Segmental and somatic dysfunction of lumbar region: Secondary | ICD-10-CM | POA: Diagnosis not present

## 2016-12-17 DIAGNOSIS — M5136 Other intervertebral disc degeneration, lumbar region: Secondary | ICD-10-CM | POA: Diagnosis not present

## 2016-12-17 DIAGNOSIS — M9905 Segmental and somatic dysfunction of pelvic region: Secondary | ICD-10-CM | POA: Diagnosis not present

## 2016-12-17 DIAGNOSIS — M9904 Segmental and somatic dysfunction of sacral region: Secondary | ICD-10-CM | POA: Diagnosis not present

## 2016-12-25 DIAGNOSIS — M9905 Segmental and somatic dysfunction of pelvic region: Secondary | ICD-10-CM | POA: Diagnosis not present

## 2016-12-25 DIAGNOSIS — M9904 Segmental and somatic dysfunction of sacral region: Secondary | ICD-10-CM | POA: Diagnosis not present

## 2016-12-25 DIAGNOSIS — M5136 Other intervertebral disc degeneration, lumbar region: Secondary | ICD-10-CM | POA: Diagnosis not present

## 2016-12-25 DIAGNOSIS — M9903 Segmental and somatic dysfunction of lumbar region: Secondary | ICD-10-CM | POA: Diagnosis not present

## 2016-12-31 ENCOUNTER — Other Ambulatory Visit: Payer: Medicare Other

## 2016-12-31 DIAGNOSIS — I10 Essential (primary) hypertension: Secondary | ICD-10-CM

## 2016-12-31 DIAGNOSIS — E039 Hypothyroidism, unspecified: Secondary | ICD-10-CM

## 2016-12-31 DIAGNOSIS — M5136 Other intervertebral disc degeneration, lumbar region: Secondary | ICD-10-CM | POA: Diagnosis not present

## 2016-12-31 DIAGNOSIS — E785 Hyperlipidemia, unspecified: Secondary | ICD-10-CM | POA: Diagnosis not present

## 2016-12-31 DIAGNOSIS — M9905 Segmental and somatic dysfunction of pelvic region: Secondary | ICD-10-CM | POA: Diagnosis not present

## 2016-12-31 DIAGNOSIS — M9903 Segmental and somatic dysfunction of lumbar region: Secondary | ICD-10-CM | POA: Diagnosis not present

## 2016-12-31 DIAGNOSIS — R739 Hyperglycemia, unspecified: Secondary | ICD-10-CM | POA: Diagnosis not present

## 2016-12-31 DIAGNOSIS — M9904 Segmental and somatic dysfunction of sacral region: Secondary | ICD-10-CM | POA: Diagnosis not present

## 2016-12-31 LAB — COMPREHENSIVE METABOLIC PANEL
ALBUMIN: 4.2 g/dL (ref 3.6–5.1)
ALK PHOS: 43 U/L (ref 33–130)
ALT: 15 U/L (ref 6–29)
AST: 19 U/L (ref 10–35)
BUN: 15 mg/dL (ref 7–25)
CALCIUM: 9.6 mg/dL (ref 8.6–10.4)
CO2: 24 mmol/L (ref 20–31)
Chloride: 105 mmol/L (ref 98–110)
Creat: 0.67 mg/dL (ref 0.60–0.88)
Glucose, Bld: 106 mg/dL — ABNORMAL HIGH (ref 65–99)
Potassium: 4.1 mmol/L (ref 3.5–5.3)
Sodium: 141 mmol/L (ref 135–146)
Total Bilirubin: 0.7 mg/dL (ref 0.2–1.2)
Total Protein: 7 g/dL (ref 6.1–8.1)

## 2016-12-31 LAB — LIPID PANEL
Cholesterol: 265 mg/dL — ABNORMAL HIGH (ref <200)
HDL: 87 mg/dL (ref >50)
LDL Cholesterol: 163 mg/dL — ABNORMAL HIGH (ref <100)
Total CHOL/HDL Ratio: 3 Ratio (ref <5.0)
Triglycerides: 74 mg/dL (ref <150)
VLDL: 15 mg/dL (ref <30)

## 2016-12-31 LAB — TSH: TSH: 1.97 mIU/L

## 2017-01-01 LAB — HEMOGLOBIN A1C
Hgb A1c MFr Bld: 5.2 % (ref <5.7)
MEAN PLASMA GLUCOSE: 103 mg/dL

## 2017-01-01 LAB — MICROALBUMIN, URINE: MICROALB UR: 0.4 mg/dL

## 2017-01-02 ENCOUNTER — Ambulatory Visit (INDEPENDENT_AMBULATORY_CARE_PROVIDER_SITE_OTHER): Payer: Medicare Other | Admitting: Internal Medicine

## 2017-01-02 ENCOUNTER — Encounter: Payer: Self-pay | Admitting: Internal Medicine

## 2017-01-02 VITALS — BP 142/76 | HR 70 | Temp 97.4°F | Ht 63.0 in | Wt 162.0 lb

## 2017-01-02 DIAGNOSIS — M25562 Pain in left knee: Secondary | ICD-10-CM | POA: Insufficient documentation

## 2017-01-02 DIAGNOSIS — G8929 Other chronic pain: Secondary | ICD-10-CM | POA: Diagnosis not present

## 2017-01-02 DIAGNOSIS — E669 Obesity, unspecified: Secondary | ICD-10-CM | POA: Diagnosis not present

## 2017-01-02 DIAGNOSIS — R739 Hyperglycemia, unspecified: Secondary | ICD-10-CM

## 2017-01-02 DIAGNOSIS — E785 Hyperlipidemia, unspecified: Secondary | ICD-10-CM | POA: Diagnosis not present

## 2017-01-02 DIAGNOSIS — E039 Hypothyroidism, unspecified: Secondary | ICD-10-CM

## 2017-01-02 DIAGNOSIS — I10 Essential (primary) hypertension: Secondary | ICD-10-CM

## 2017-01-02 NOTE — Progress Notes (Signed)
Facility  [place]    Place of Service:   OFFICE    Allergies  Allergen Reactions  . Codeine Nausea And Vomiting  . Lodine [Etodolac] Nausea And Vomiting  . Penicillins     unknown    Chief Complaint  Patient presents with  . Medical Management of Chronic Issues    6 month medication management blood pressure, thyroid, cholesterol, hyperglycemia, review labs    HPI:   Essential hypertension - CONTROLLED  Hyperlipidemia, unspecified hyperlipidemia type - running high, but at age 83, I see little value in adding another medication at this time  Hyperglycemia - controlled  Hypothyroidism, unspecified type - compensated  Mildly obese - unchanged  Chronic pain of left knee - Seeing chiropractor about left knee pain. Using ibuprofen. No swelling. Gradually imoroving. Had some swelling of the feet bilaterally recently, but it improved today.    Medications: Patient's Medications  New Prescriptions   No medications on file  Previous Medications   ALPRAZOLAM (XANAX) 0.5 MG TABLET    Take 0.25 mg by mouth 3 (three) times daily as needed for anxiety.    ASPIRIN 81 MG TABLET    Take one tablet once daily to prevent stroke   BIMATOPROST (LUMIGAN) 0.01 % SOLN    Place 1 drop into both eyes at bedtime.   CALCIUM-MAGNESIUM-VITAMIN D [id number] MG-MG-UNIT TABS    Take one tablet once daily for supplement   FLUTICASONE (FLONASE) 50 MCG/ACT NASAL SPRAY    Place 1 spray into both nostrils daily.   GARLIC PO    Take 1 capsule by mouth daily.   GLUCOSAMINE-CHONDROITIN 500-400 MG TABLET    Take 1 tablet by mouth 3 (three) times daily.    IBUPROFEN (ADVIL,MOTRIN) 200 MG TABLET    Take 200 mg by mouth every 6 (six) hours as needed for mild pain.   INULIN (FIBER CHOICE PO)    Take 5 g by mouth 2 (two) times daily.   LEVOTHYROXINE (SYNTHROID, LEVOTHROID) 50 MCG TABLET    TAKE 1 TABLET DAILY FOR THYROID SUPPLEMENT   METOPROLOL (LOPRESSOR) 50 MG TABLET    One tablet daily to control BP   MULTIPLE VITAMIN (MULTIVITAMIN WITH MINERALS) TABS TABLET    Take 1 tablet by mouth daily.   OMEGA-3 FATTY ACIDS (FISH OIL) 1200 MG CAPS    Take 1,200 mg by mouth 3 (three) times daily.    OMEPRAZOLE (PRILOSEC) 20 MG CAPSULE    Take one capsule by mouth once daily to reduce stomach acid.   OVER THE COUNTER MEDICATION    Take 1 tablet by mouth daily. BONE CARE VITAMIN  Modified Medications   No medications on file  Discontinued Medications   No medications on file    Review of Systems  Constitutional: Negative for activity change, appetite change, chills, fatigue, fever and unexpected weight change.  HENT: Positive for ear pain (right). Negative for congestion, dental problem, sore throat and trouble swallowing.   Eyes: Negative for visual disturbance.  Respiratory: Negative for apnea, choking, chest tightness and shortness of breath.   Cardiovascular: Negative for chest pain, palpitations and leg swelling.  Gastrointestinal: Negative for abdominal distention, abdominal pain and constipation.       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 S/P endoscopic surgery. Mild dysphagia and hx of esophageal dysmotility.  Endocrine: Negative.        Hyperglycemia.  Genitourinary: Negative.   Musculoskeletal: Positive for arthralgias. Negative for back pain, neck pain and neck  stiffness.  Skin:       Residual lump at the left triceps where she had a SCC removed by Dr. Allyson Sabal 2014. Residual AK of right triceps. Painful corn on the sole of the right foot  Allergic/Immunologic: Negative.   Neurological: Positive for dizziness and light-headedness. Negative for facial asymmetry and headaches.  Psychiatric/Behavioral: Negative for agitation, behavioral problems and confusion.    Vitals:   01/02/17 1055  BP: (!) 142/76  Pulse: 70  Temp: 97.4 F (36.3 C)  TempSrc: Oral  SpO2: 97%  Weight: 162 lb (73.5 kg)  Height: 5' 3"  (1.6 m)   Body mass index is 28.7 kg/m. Wt Readings  from Last 3 Encounters:  01/02/17 162 lb (73.5 kg)  07/04/16 158 lb (71.7 kg)  07/04/16 158 lb 0.4 oz (71.7 kg)      Physical Exam  Constitutional: She is oriented to person, place, and time. She appears well-developed and well-nourished. No distress.  HENT:  Head: Normocephalic and atraumatic.  Right Ear: External ear normal.  Left Ear: External ear normal.  Nose: Nose normal.  Mouth/Throat: Oropharynx is clear and moist.  Eyes: Conjunctivae and EOM are normal. Pupils are equal, round, and reactive to light.  Corrective lenses  Neck: No JVD present. No tracheal deviation present. No thyromegaly present.  Cardiovascular: Normal rate, regular rhythm, normal heart sounds and intact distal pulses.  Exam reveals no gallop and no friction rub.   No murmur heard. Pulmonary/Chest: Effort normal and breath sounds normal. No respiratory distress. She has no wheezes. She has no rales. She exhibits no tenderness.  Abdominal: She exhibits no distension and no mass. There is no tenderness.  Musculoskeletal: Normal range of motion. She exhibits no edema or tenderness.  Bilateral bunios. Left is painful  Neurological: She is alert and oriented to person, place, and time. She has normal reflexes. No cranial nerve deficit. Coordination normal.  Skin: No erythema. No pallor.  2 sebaceous cysts of the scalp Lesions on the right and left triceps area. SK left neck. Old ingrown toenail area of the left great toe medially. Corn on the sole of the right foot at the 1st MTP joint.  Psychiatric: She has a normal mood and affect. Her behavior is normal. Judgment and thought content normal.    Labs reviewed: Lab Summary Latest Ref Rng & Units 12/31/2016 07/02/2016 09/07/2015  Hemoglobin 11.1 - 15.9 g/dL (None) (None) 14.0  Hematocrit 34.0 - 46.6 % (None) (None) 41.1  White count 3.4 - 10.8 x10E3/uL (None) (None) 5.1  Platelet count - (None) (None) (None)  Sodium 135 - 146 mmol/L 141 141 143  Potassium  3.5 - 5.3 mmol/L 4.1 4.3 4.2  Calcium 8.6 - 10.4 mg/dL 9.6 9.3 9.3  Phosphorus - (None) (None) (None)  Creatinine 0.60 - 0.88 mg/dL 0.67 0.70 0.70  AST 10 - 35 U/L 19 19 24   Alk Phos 33 - 130 U/L 43 38 56  Bilirubin 0.2 - 1.2 mg/dL 0.7 0.7 0.6  Glucose 65 - 99 mg/dL 106(H) 106(H) 108(H)  Cholesterol <200 mg/dL 265(H) 231(H) (None)  HDL cholesterol >50 mg/dL 87 72 80  Triglycerides <150 mg/dL 74 85 99  LDL Direct - (None) (None) (None)  LDL Calc <100 mg/dL 163(H) 142(H) 121(H)  Total protein 6.1 - 8.1 g/dL 7.0 6.2 (None)  Albumin 3.6 - 5.1 g/dL 4.2 3.9 4.5  Some recent data might be hidden   Lab Results  Component Value Date   TSH 1.97 12/31/2016   TSH 1.81  07/02/2016   TSH 1.520 09/07/2015   Lab Results  Component Value Date   BUN 15 12/31/2016   BUN 14 07/02/2016   BUN 13 09/07/2015   Lab Results  Component Value Date   HGBA1C 5.2 12/31/2016   HGBA1C 5.3 07/02/2016   HGBA1C 5.8 (H) 06/21/2014    Assessment/Plan  1. Essential hypertension controlled - Comprehensive metabolic panel; Future  2. Hyperlipidemia, unspecified hyperlipidemia type controlled - Lipid panel; Future  3. Hyperglycemia controlled - Hemoglobin A1c; Future - Comprehensive metabolic panel; Future  4. Hypothyroidism, unspecified type compensated  5. Mildly obese Recommend lose 10 #  6. Chronic pain of left knee Continue with chiropractor

## 2017-01-07 DIAGNOSIS — M9903 Segmental and somatic dysfunction of lumbar region: Secondary | ICD-10-CM | POA: Diagnosis not present

## 2017-01-07 DIAGNOSIS — M9905 Segmental and somatic dysfunction of pelvic region: Secondary | ICD-10-CM | POA: Diagnosis not present

## 2017-01-07 DIAGNOSIS — M9904 Segmental and somatic dysfunction of sacral region: Secondary | ICD-10-CM | POA: Diagnosis not present

## 2017-01-07 DIAGNOSIS — M5136 Other intervertebral disc degeneration, lumbar region: Secondary | ICD-10-CM | POA: Diagnosis not present

## 2017-01-15 DIAGNOSIS — M9904 Segmental and somatic dysfunction of sacral region: Secondary | ICD-10-CM | POA: Diagnosis not present

## 2017-01-15 DIAGNOSIS — M9905 Segmental and somatic dysfunction of pelvic region: Secondary | ICD-10-CM | POA: Diagnosis not present

## 2017-01-15 DIAGNOSIS — M9903 Segmental and somatic dysfunction of lumbar region: Secondary | ICD-10-CM | POA: Diagnosis not present

## 2017-01-15 DIAGNOSIS — M5136 Other intervertebral disc degeneration, lumbar region: Secondary | ICD-10-CM | POA: Diagnosis not present

## 2017-01-21 DIAGNOSIS — M9904 Segmental and somatic dysfunction of sacral region: Secondary | ICD-10-CM | POA: Diagnosis not present

## 2017-01-21 DIAGNOSIS — M5136 Other intervertebral disc degeneration, lumbar region: Secondary | ICD-10-CM | POA: Diagnosis not present

## 2017-01-21 DIAGNOSIS — M9905 Segmental and somatic dysfunction of pelvic region: Secondary | ICD-10-CM | POA: Diagnosis not present

## 2017-01-21 DIAGNOSIS — M9903 Segmental and somatic dysfunction of lumbar region: Secondary | ICD-10-CM | POA: Diagnosis not present

## 2017-01-28 ENCOUNTER — Other Ambulatory Visit: Payer: Self-pay | Admitting: *Deleted

## 2017-01-28 DIAGNOSIS — M5136 Other intervertebral disc degeneration, lumbar region: Secondary | ICD-10-CM | POA: Diagnosis not present

## 2017-01-28 DIAGNOSIS — M9905 Segmental and somatic dysfunction of pelvic region: Secondary | ICD-10-CM | POA: Diagnosis not present

## 2017-01-28 DIAGNOSIS — M9903 Segmental and somatic dysfunction of lumbar region: Secondary | ICD-10-CM | POA: Diagnosis not present

## 2017-01-28 DIAGNOSIS — M9904 Segmental and somatic dysfunction of sacral region: Secondary | ICD-10-CM | POA: Diagnosis not present

## 2017-01-28 MED ORDER — ALPRAZOLAM 0.5 MG PO TABS: 0.2500 mg | ORAL_TABLET | Freq: Three times a day (TID) | ORAL | 0 refills | Status: DC | PRN

## 2017-01-28 NOTE — Telephone Encounter (Signed)
[facility] 

## 2017-01-29 DIAGNOSIS — Z1231 Encounter for screening mammogram for malignant neoplasm of breast: Secondary | ICD-10-CM | POA: Diagnosis not present

## 2017-01-29 DIAGNOSIS — Z803 Family history of malignant neoplasm of breast: Secondary | ICD-10-CM | POA: Diagnosis not present

## 2017-01-29 LAB — HM MAMMOGRAPHY

## 2017-01-30 ENCOUNTER — Encounter: Payer: Self-pay | Admitting: *Deleted

## 2017-02-02 DIAGNOSIS — M25551 Pain in right hip: Secondary | ICD-10-CM | POA: Diagnosis not present

## 2017-02-02 DIAGNOSIS — M5431 Sciatica, right side: Secondary | ICD-10-CM | POA: Diagnosis not present

## 2017-02-05 DIAGNOSIS — M5136 Other intervertebral disc degeneration, lumbar region: Secondary | ICD-10-CM | POA: Diagnosis not present

## 2017-02-05 DIAGNOSIS — M9903 Segmental and somatic dysfunction of lumbar region: Secondary | ICD-10-CM | POA: Diagnosis not present

## 2017-02-05 DIAGNOSIS — M9904 Segmental and somatic dysfunction of sacral region: Secondary | ICD-10-CM | POA: Diagnosis not present

## 2017-02-05 DIAGNOSIS — M9905 Segmental and somatic dysfunction of pelvic region: Secondary | ICD-10-CM | POA: Diagnosis not present

## 2017-02-06 ENCOUNTER — Encounter: Payer: Self-pay | Admitting: Internal Medicine

## 2017-02-11 DIAGNOSIS — M9905 Segmental and somatic dysfunction of pelvic region: Secondary | ICD-10-CM | POA: Diagnosis not present

## 2017-02-11 DIAGNOSIS — M9903 Segmental and somatic dysfunction of lumbar region: Secondary | ICD-10-CM | POA: Diagnosis not present

## 2017-02-11 DIAGNOSIS — L309 Dermatitis, unspecified: Secondary | ICD-10-CM | POA: Diagnosis not present

## 2017-02-11 DIAGNOSIS — M9904 Segmental and somatic dysfunction of sacral region: Secondary | ICD-10-CM | POA: Diagnosis not present

## 2017-02-11 DIAGNOSIS — M5136 Other intervertebral disc degeneration, lumbar region: Secondary | ICD-10-CM | POA: Diagnosis not present

## 2017-02-11 DIAGNOSIS — L570 Actinic keratosis: Secondary | ICD-10-CM | POA: Diagnosis not present

## 2017-02-19 DIAGNOSIS — M9903 Segmental and somatic dysfunction of lumbar region: Secondary | ICD-10-CM | POA: Diagnosis not present

## 2017-02-19 DIAGNOSIS — M9904 Segmental and somatic dysfunction of sacral region: Secondary | ICD-10-CM | POA: Diagnosis not present

## 2017-02-19 DIAGNOSIS — M9905 Segmental and somatic dysfunction of pelvic region: Secondary | ICD-10-CM | POA: Diagnosis not present

## 2017-02-19 DIAGNOSIS — M5136 Other intervertebral disc degeneration, lumbar region: Secondary | ICD-10-CM | POA: Diagnosis not present

## 2017-02-21 DIAGNOSIS — M5441 Lumbago with sciatica, right side: Secondary | ICD-10-CM | POA: Diagnosis not present

## 2017-02-25 DIAGNOSIS — H40013 Open angle with borderline findings, low risk, bilateral: Secondary | ICD-10-CM | POA: Diagnosis not present

## 2017-02-26 ENCOUNTER — Encounter: Payer: Self-pay | Admitting: Nurse Practitioner

## 2017-02-26 ENCOUNTER — Ambulatory Visit (INDEPENDENT_AMBULATORY_CARE_PROVIDER_SITE_OTHER): Payer: Medicare Other | Admitting: Nurse Practitioner

## 2017-02-26 VITALS — BP 122/76 | HR 66 | Temp 97.8°F | Resp 18 | Ht 63.0 in | Wt 159.0 lb

## 2017-02-26 DIAGNOSIS — R110 Nausea: Secondary | ICD-10-CM

## 2017-02-26 DIAGNOSIS — W1800XA Striking against unspecified object with subsequent fall, initial encounter: Secondary | ICD-10-CM

## 2017-02-26 DIAGNOSIS — R55 Syncope and collapse: Secondary | ICD-10-CM

## 2017-02-26 DIAGNOSIS — R531 Weakness: Secondary | ICD-10-CM

## 2017-02-26 LAB — CBC WITH DIFFERENTIAL/PLATELET
BASOS ABS: 0 {cells}/uL (ref 0–200)
Basophils Relative: 0 %
EOS ABS: 61 {cells}/uL (ref 15–500)
EOS PCT: 1 %
HEMATOCRIT: 38.1 % (ref 35.0–45.0)
HEMOGLOBIN: 12.7 g/dL (ref 11.7–15.5)
LYMPHS ABS: 2074 {cells}/uL (ref 850–3900)
Lymphocytes Relative: 34 %
MCH: 31.4 pg (ref 27.0–33.0)
MCHC: 33.3 g/dL (ref 32.0–36.0)
MCV: 94.3 fL (ref 80.0–100.0)
MONO ABS: 427 {cells}/uL (ref 200–950)
MPV: 10.7 fL (ref 7.5–12.5)
Monocytes Relative: 7 %
NEUTROS PCT: 58 %
Neutro Abs: 3538 cells/uL (ref 1500–7800)
Platelets: 153 10*3/uL (ref 140–400)
RBC: 4.04 MIL/uL (ref 3.80–5.10)
RDW: 13.2 % (ref 11.0–15.0)
WBC: 6.1 10*3/uL (ref 3.8–10.8)

## 2017-02-26 NOTE — Patient Instructions (Signed)
To make sure you are eating 3 meals a day Can use ensure as supplement NOT replacement of a meal  Home health has been consulted due to weakness with fall Also [facility] has been consulted to help with in home resources

## 2017-02-26 NOTE — Progress Notes (Signed)
Careteam: Patient Care Team: Tonya Dooms, MD as PCP - General (Internal Medicine) Tonya Shipper, MD as Consulting Physician (Gastroenterology) Tonya Breed Denice Bors, MD as Consulting Physician (Cardiology) Tonya Brownie, MD as Consulting Physician (Dermatology) Tonya Drought, MD as Consulting Physician (Ophthalmology)  Advanced Directive information Does Patient Have a Medical Advance Directive?: Yes, Type of Advance Directive: Living will  Allergies  Allergen Reactions  . Codeine Nausea And Vomiting  . Lodine [Etodolac] Nausea And Vomiting  . Penicillins     unknown    Chief Complaint  Patient presents with  . Acute Visit    Pt fell 3 days ago due to sudden feeling of spinning and she blacked out. Pt hit her back, right elbow and has had on/off nausea and dizziness since fall.   Tonya Russell    Neighbor, Tonya Russell, in room. Tonya Russell helped pt after fall.      HPI: Patient is a 83 y.o. female seen in the office today after fall. Pt reports she blacked out and feel into a [employer] tower and wooden stand. Previous pt of Tonya Russell planning to see Tonya Russell at next follow up. Pt with hx of CAD, HTN, PVD, GERD, hypothyroid, hyperglycemia and others. Pt feel 2 days ago. She remembered standing and then got dizzy and then the next thing she was on the floor and thought to call fire department but ended up getting up and called Tonya Russell (her neighbor). Thinks she may have been looking out the window looking for her neighbor prior to the fall. Does not remember hitting [employer] tower. Has a bruise on her right upper arm and right mid back. Did not hit head. States she has been having more nausea (nausea is not new). Stomach feeling empty and feeling more nauseous.  Gets nauseous with her medication. Has not eaten this morning and is nauseous. Also does not feel "right in the head" Tonya Russell thinks her blood sugar is getting low because she gave her food and nausea, dizziness and funny feelings in her head  improves after she eats.  States does not have a taste or smell for much so does not eat great.  Maybe will eat a nab or a cookie for a meal.  Lives alone. Has life alert. No husband or children. Has sister but they do not live close.  No longer driving.  Not dizzy at this time.  Worsen weakness and fatigue.   Has a lot of anxiety- bad nerves. afraid something bad is going to happen to her when she's home alone.   Going to orthopedic doctor due to sciatica  Numbness to bottom of feet for years.  Review of Systems:  Review of Systems  Constitutional: Positive for malaise/fatigue. Negative for chills, fever and weight loss.  HENT: Negative for tinnitus.   Respiratory: Negative for cough, sputum production and shortness of breath.   Cardiovascular: Positive for palpitations. Negative for chest pain and leg swelling.  Gastrointestinal: Positive for nausea. Negative for abdominal pain, constipation, diarrhea and heartburn.  Genitourinary: Negative for dysuria, frequency and urgency.  Musculoskeletal: Positive for back pain, falls and myalgias. Negative for joint pain.  Skin: Negative.   Neurological: Positive for dizziness and weakness. Negative for tingling, tremors, sensory change and headaches.  Psychiatric/Behavioral: Negative for depression and memory loss. The patient is nervous/anxious. The patient does not have insomnia.     Past Medical History:  Diagnosis Date  . Abdominal pain, epigastric   . Allergic rhinitis due to other allergen   .  Allergic rhinitis, cause unspecified   . Dermatophytosis of nail   . Diffuse cystic mastopathy   . Diverticulosis of colon (without mention of hemorrhage)   . Dysphagia, unspecified(787.20)   . Esophageal reflux   . Family history of malignant neoplasm of gastrointestinal tract   . Hiatal hernia   . Hyperlipidemia   . Hypertension   . Internal hemorrhoids without mention of complication   . 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 Obesity   . Oral aphthae   . Osteoarthrosis, unspecified whether generalized or localized, unspecified site   . Other abnormal blood chemistry   . Other voice and resonance disorders   . Pain in joint, pelvic region and thigh   . Palpitations   . Pancreatitis   . Peripheral vascular disease, unspecified ([place])   . Schatzki's ring   . Senile osteoporosis   . Torus fracture of fibula   . Unspecified constipation   . Unspecified hypothyroidism    Past Surgical History:  Procedure Laterality Date  . APPENDECTOMY    . BASAL CELL CARCINOMA EXCISION     right humerus, right temple, left side of nose  . BASAL CELL CARCINOMA EXCISION     chest, left cheek  . CATARACT EXTRACTION, BILATERAL    . CHOLECYSTECTOMY    . keratosis     left arm  . resection of ampullary tumor    . squamous cell cancer     Right leg  . squamous cell cancer left triceps Left    Tonya Russell  . TONSILLECTOMY AND ADENOIDECTOMY     Social History:   reports that she has never smoked. She has never used smokeless tobacco. She reports that she does not drink alcohol or use drugs.  Family History  Problem Relation Age of Onset  . Cancer Mother        spindle cell carcinoma on neck  . Colon cancer Father 76  . Breast cancer Sister   . Colon polyps Sister   . Heart disease Brother   . Breast cancer Unknown        neiece x 3  . Esophageal cancer Neg Hx   . Pancreatic cancer Neg Hx   . Kidney disease Neg Hx   . Liver disease Neg Hx     Medications: Patient's Medications  New Prescriptions   No medications on file  Previous Medications   ALPRAZOLAM (XANAX) 0.5 MG TABLET    Take 0.5 tablets (0.25 mg total) by mouth 3 (three) times daily as needed for anxiety.   ASPIRIN 81 MG TABLET    Take one tablet once daily to prevent stroke   BIMATOPROST (LUMIGAN) 0.01 % SOLN    Place 1 drop into both eyes at bedtime.   CALCIUM-MAGNESIUM-VITAMIN D [phone] MG-MG-UNIT TABS    Take one tablet once daily for  supplement   FLUTICASONE (FLONASE) 50 MCG/ACT NASAL SPRAY    Place 1 spray into both nostrils daily.   GABAPENTIN (NEURONTIN) 100 MG CAPSULE    gabapentin 100 mg capsule  Take 1 capsule twice a day by oral route around the clock for 10 days.   GARLIC PO    Take 1 capsule by mouth daily.   GLUCOSAMINE-CHONDROITIN 500-400 MG TABLET    Take 1 tablet by mouth 3 (three) times daily.    IBUPROFEN (ADVIL,MOTRIN) 200 MG TABLET    Take 200 mg by mouth every 6 (six) hours as needed for mild pain.  INULIN (FIBER CHOICE PO)    Take 5 g by mouth 2 (two) times daily.   LEVOTHYROXINE (SYNTHROID, LEVOTHROID) 50 MCG TABLET    TAKE 1 TABLET DAILY FOR THYROID SUPPLEMENT   METOPROLOL (LOPRESSOR) 50 MG TABLET    One tablet daily to control BP   MULTIPLE VITAMIN (MULTIVITAMIN WITH MINERALS) TABS TABLET    Take 1 tablet by mouth daily.   OMEGA-3 FATTY ACIDS (FISH OIL) 1200 MG CAPS    Take 1,200 mg by mouth 3 (three) times daily.    OMEPRAZOLE (PRILOSEC) 20 MG CAPSULE    Take one capsule by mouth once daily to reduce stomach acid.   OVER THE COUNTER MEDICATION    Take 1 tablet by mouth daily. BONE CARE VITAMIN  Modified Medications   No medications on file  Discontinued Medications   No medications on file     Physical Exam:  Vitals:   02/26/17 1139  BP: 122/76  Pulse: 66  Resp: 18  Temp: 97.8 F (36.6 C)  TempSrc: Oral  SpO2: 98%  Weight: 159 lb (72.1 kg)  Height: 5' 3"  (1.6 m)   Body mass index is 28.17 kg/m.  Physical Exam  Constitutional: She is oriented to person, place, and time. She appears well-developed and well-nourished. No distress.  HENT:  Head: Normocephalic and atraumatic.  Right Ear: External ear normal.  Left Ear: External ear normal.  Nose: Nose normal.  Mouth/Throat: Oropharynx is clear and moist.  Eyes: Conjunctivae and EOM are normal. Pupils are equal, round, and reactive to light.  Corrective lenses  Neck: No JVD present. No tracheal deviation present. No thyromegaly  present.  Cardiovascular: Normal rate, regular rhythm, normal heart sounds and intact distal pulses.  Exam reveals no gallop and no friction rub.   No murmur heard. Pulmonary/Chest: Effort normal and breath sounds normal. No respiratory distress. She has no wheezes. She has no rales. She exhibits no tenderness.  Abdominal: Soft. Bowel sounds are normal. She exhibits no distension and no mass. There is no tenderness.  Musculoskeletal: Normal range of motion. She exhibits no edema or tenderness.  Bilateral bunios. Left is painful  Neurological: She is alert and oriented to person, place, and time. She has normal reflexes. She displays normal reflexes. No cranial nerve deficit. Coordination normal.  Repeats herself often  Skin: No erythema. No pallor.  Yellow bruising with abrasion to right mid back. tender ecchymosis noted to right medial upper arm. nontender  Psychiatric: She has a normal mood and affect. Her behavior is normal. Judgment and thought content normal.    Labs reviewed: Basic Metabolic Panel:  Recent Labs  07/02/16 0919 12/31/16 0917  NA 141 141  K 4.3 4.1  CL 107 105  CO2 28 24  GLUCOSE 106* 106*  BUN 14 15  CREATININE 0.70 0.67  CALCIUM 9.3 9.6  TSH 1.81 1.97   Liver Function Tests:  Recent Labs  07/02/16 0919 12/31/16 0917  AST 19 19  ALT 15 15  ALKPHOS 38 43  BILITOT 0.7 0.7  PROT 6.2 7.0  ALBUMIN 3.9 4.2   No results for input(s): LIPASE, AMYLASE in the last 8760 hours. No results for input(s): AMMONIA in the last 8760 hours. CBC: No results for input(s): WBC, NEUTROABS, HGB, HCT, MCV, PLT in the last 8760 hours. Lipid Panel:  Recent Labs  07/02/16 0919 12/31/16 0917  CHOL 231* 265*  HDL 72 87  LDLCALC 142* 163*  TRIG 85 74  CHOLHDL 3.2 3.0   TSH:  Recent Labs  07/02/16 0919 12/31/16 0917  TSH 1.81 1.97   A1C: Lab Results  Component Value Date   HGBA1C 5.2 12/31/2016     Assessment/Plan .1. Weakness -suspect weakness,  nausea and fall due to lack of proper nutrition since when neighbor states she eats symptoms improve.  Pt lives alone without family and limited help.  - [facility] referral to [facility] - Ambulatory referral to [facility] with Differential/Platelets - CMP with eGFR - TSH  2. Fall against object - Ambulatory referral to [facility] for additional resources in the home - Ambulatory referral to [facility] for gait and strength training   3. Nausea -improves with eating. Encouraged to eat 3 meals a day.  -encouraged to take ensures daily as meal supplement - pt having trouble with making meals and decrease in appetite. Not eating regularly and has anxiety about being home alone.  -Ambulatory referral to [facility] to help pt with resources. She also has neighbor Chad who is here today who helps her.   4. Syncope and collapse -EKG- NSR rate 67 - CBC with Differential/Platelets - CMP with eGFR - TSH -reports she feels weak now but has not eaten today. Tonya Russell plans to take her to lunch after appt. Educated to take Am pills with food and we will adjust synthroid if needed. She keeps putting off meals due to medication and then becoming more weak with worsening nausea -discussed return precautions with Tonya Russell and pt. If symptoms worsen or fail to improve to seek care. Discussed when to to go ED and to Call if needed.    Carlos American. Harle Battiest  [facility] [phone] 8 am - 5 pm) [phone] (after hours)

## 2017-02-27 DIAGNOSIS — M545 Low back pain: Secondary | ICD-10-CM | POA: Diagnosis not present

## 2017-02-27 LAB — COMPLETE METABOLIC PANEL WITH GFR
ALBUMIN: 4.2 g/dL (ref 3.6–5.1)
ALK PHOS: 42 U/L (ref 33–130)
ALT: 14 U/L (ref 6–29)
AST: 21 U/L (ref 10–35)
BILIRUBIN TOTAL: 0.7 mg/dL (ref 0.2–1.2)
BUN: 15 mg/dL (ref 7–25)
CALCIUM: 9.4 mg/dL (ref 8.6–10.4)
CO2: 26 mmol/L (ref 20–31)
Chloride: 106 mmol/L (ref 98–110)
Creat: 0.71 mg/dL (ref 0.60–0.88)
GFR, EST AFRICAN AMERICAN: 87 mL/min (ref >=60)
GFR, EST NON AFRICAN AMERICAN: 75 mL/min (ref >=60)
Glucose, Bld: 110 mg/dL — ABNORMAL HIGH (ref 65–99)
POTASSIUM: 4 mmol/L (ref 3.5–5.3)
Sodium: 142 mmol/L (ref 135–146)
TOTAL PROTEIN: 6.8 g/dL (ref 6.1–8.1)

## 2017-02-27 LAB — TSH: TSH: 1.24 m[IU]/L

## 2017-03-01 DIAGNOSIS — I2510 Atherosclerotic heart disease of native coronary artery without angina pectoris: Secondary | ICD-10-CM | POA: Diagnosis not present

## 2017-03-01 DIAGNOSIS — Z9181 History of falling: Secondary | ICD-10-CM | POA: Diagnosis not present

## 2017-03-01 DIAGNOSIS — I10 Essential (primary) hypertension: Secondary | ICD-10-CM | POA: Diagnosis not present

## 2017-03-01 DIAGNOSIS — M6281 Muscle weakness (generalized): Secondary | ICD-10-CM | POA: Diagnosis not present

## 2017-03-06 DIAGNOSIS — Z9181 History of falling: Secondary | ICD-10-CM | POA: Diagnosis not present

## 2017-03-06 DIAGNOSIS — I10 Essential (primary) hypertension: Secondary | ICD-10-CM | POA: Diagnosis not present

## 2017-03-06 DIAGNOSIS — I2510 Atherosclerotic heart disease of native coronary artery without angina pectoris: Secondary | ICD-10-CM | POA: Diagnosis not present

## 2017-03-06 DIAGNOSIS — M6281 Muscle weakness (generalized): Secondary | ICD-10-CM | POA: Diagnosis not present

## 2017-03-08 DIAGNOSIS — I10 Essential (primary) hypertension: Secondary | ICD-10-CM | POA: Diagnosis not present

## 2017-03-08 DIAGNOSIS — Z9181 History of falling: Secondary | ICD-10-CM | POA: Diagnosis not present

## 2017-03-08 DIAGNOSIS — M6281 Muscle weakness (generalized): Secondary | ICD-10-CM | POA: Diagnosis not present

## 2017-03-08 DIAGNOSIS — I2510 Atherosclerotic heart disease of native coronary artery without angina pectoris: Secondary | ICD-10-CM | POA: Diagnosis not present

## 2017-03-12 ENCOUNTER — Other Ambulatory Visit: Payer: Self-pay

## 2017-03-12 MED ORDER — ALPRAZOLAM 0.5 MG PO TABS: ORAL_TABLET | ORAL | 0 refills | Status: DC

## 2017-03-12 NOTE — Addendum Note (Signed)
Addended by: Royann Shivers A on: 03/12/2017 04:00 PM   Modules accepted: Orders

## 2017-03-12 NOTE — Telephone Encounter (Signed)
Called patient to confirm how she is taking her Alprazolam, 1/2 a tablet three times daily. Spoke with patient 17 minutes, she is very stressed about being nausea in the mornings. The Alprazolam does help. She was told by the office to drink and eat better and she is trying, but it's hard when she feels so nausea. She doesn't have air condition, she has 7 fans. Her pastor has put ice in front of the fan to make it cooler. She has follow-up appt with Sherrie Mustache 03/27/17. Told her to be sure to drink lots of fluids, with this heat, and her age she could get dehydrated. Printed Rx for [employer].

## 2017-03-14 DIAGNOSIS — I2510 Atherosclerotic heart disease of native coronary artery without angina pectoris: Secondary | ICD-10-CM | POA: Diagnosis not present

## 2017-03-14 DIAGNOSIS — Z9181 History of falling: Secondary | ICD-10-CM | POA: Diagnosis not present

## 2017-03-14 DIAGNOSIS — I10 Essential (primary) hypertension: Secondary | ICD-10-CM | POA: Diagnosis not present

## 2017-03-14 DIAGNOSIS — M6281 Muscle weakness (generalized): Secondary | ICD-10-CM | POA: Diagnosis not present

## 2017-03-19 DIAGNOSIS — B070 Plantar wart: Secondary | ICD-10-CM | POA: Diagnosis not present

## 2017-03-19 DIAGNOSIS — M48062 Spinal stenosis, lumbar region with neurogenic claudication: Secondary | ICD-10-CM | POA: Diagnosis not present

## 2017-03-20 DIAGNOSIS — M6281 Muscle weakness (generalized): Secondary | ICD-10-CM | POA: Diagnosis not present

## 2017-03-20 DIAGNOSIS — I10 Essential (primary) hypertension: Secondary | ICD-10-CM | POA: Diagnosis not present

## 2017-03-20 DIAGNOSIS — I2510 Atherosclerotic heart disease of native coronary artery without angina pectoris: Secondary | ICD-10-CM | POA: Diagnosis not present

## 2017-03-20 DIAGNOSIS — Z9181 History of falling: Secondary | ICD-10-CM | POA: Diagnosis not present

## 2017-03-27 ENCOUNTER — Encounter: Payer: Self-pay | Admitting: Nurse Practitioner

## 2017-03-27 ENCOUNTER — Ambulatory Visit (INDEPENDENT_AMBULATORY_CARE_PROVIDER_SITE_OTHER): Payer: Medicare Other | Admitting: Nurse Practitioner

## 2017-03-27 VITALS — BP 124/84 | HR 81 | Temp 97.6°F | Resp 17 | Ht 63.0 in | Wt 154.2 lb

## 2017-03-27 DIAGNOSIS — R531 Weakness: Secondary | ICD-10-CM | POA: Diagnosis not present

## 2017-03-27 DIAGNOSIS — E669 Obesity, unspecified: Secondary | ICD-10-CM

## 2017-03-27 DIAGNOSIS — M6281 Muscle weakness (generalized): Secondary | ICD-10-CM | POA: Diagnosis not present

## 2017-03-27 DIAGNOSIS — I10 Essential (primary) hypertension: Secondary | ICD-10-CM | POA: Diagnosis not present

## 2017-03-27 DIAGNOSIS — R110 Nausea: Secondary | ICD-10-CM | POA: Diagnosis not present

## 2017-03-27 DIAGNOSIS — I2510 Atherosclerotic heart disease of native coronary artery without angina pectoris: Secondary | ICD-10-CM | POA: Diagnosis not present

## 2017-03-27 DIAGNOSIS — Z9181 History of falling: Secondary | ICD-10-CM | POA: Diagnosis not present

## 2017-03-27 NOTE — Patient Instructions (Addendum)
Take fish oils for eyes  (per your eye doctor) STOP garlic, cinnamon and one of the calcium, mag and vit d3  Ask your orthopedic about the glucosamine   Cont BONE care (take this in the evening now) and take multi-vitamin in the morning   Decrease metoprolol to 1/2 tablet daily for 4 days then stop   To take ensure daily after lunch.   Make sure you are eating 3 meals a day as well.

## 2017-03-27 NOTE — Progress Notes (Signed)
Careteam: Patient Care Team: Gayland Curry, DO as PCP - General (Geriatric Medicine) [name], MD as Consulting Physician (Gastroenterology) Lelon Perla, MD as Consulting Physician (Cardiology) Druscilla Brownie, MD as Consulting Physician (Dermatology) [name], MD as Consulting Physician (Ophthalmology)  Advanced Directive information Does Patient Have a Medical Advance Directive?: Yes, Type of Advance Directive: Living will;Out of facility DNR (pink MOST or yellow form)  Allergies  Allergen Reactions  . Codeine Nausea And Vomiting  . Lodine [Etodolac] Nausea And Vomiting  . Penicillins     unknown    Chief Complaint  Patient presents with  . Follow-up    Pt is being seen for a follow up on anxiety and weakness. Pt reports she is till having anxiety attacks at times.      HPI: Patient is a 83 y.o. female seen in the office today for follow up on chronic condition. Pt with hx of CAD, HTN, PVD, GERD, hypothyroid, hyperglycemia and others.  Pt was seen last month after fall. She lives alone and has no immediate family. Neighbor helps her. Concern that she was not eating correctly. Also with increased weakness at last appt therefore PT was order. PT coming out and reports it is going well. No more falls.   After she takes her medication feels lightheaded and eyes feel weak.  Went to Ophthalmologist and exam was unchanged, has glaucoma.  Reports she is eating 3 meals a day. Nausea is better in the morning.  Snacks sometimes in between. Not using ensures.   Anxiety- Uses alprazolamroutinely daily, may take another dose throughout the day but overall feels like anxiety is better except for when she first wakes up (xanax helps) Still feeling foggy feeling in head in the morning (has a lot of anxiety when this happens)  Taking prednisone by orthopedic but unsure why. Was prescribed on 03/19/17.   Taking a lot of supplements wants to stop.  Marland Kitchen    MMSE - Mini  Mental State Exam 03/27/2017 07/04/2016  Orientation to time 5 5  Orientation to Place 5 5  Registration 3 3  Attention/ Calculation 5 5  Recall 3 3  Language- name 2 objects 2 2  Language- repeat 1 1  Language- follow 3 step command 3 3  Language- read & follow direction 1 1  Write a sentence 1 1  Copy design 1 1  Total score 30 30    Review of Systems:  Review of Systems  Constitutional: Positive for malaise/fatigue (improved but still there). Negative for chills, fever and weight loss.  HENT: Negative for tinnitus.   Respiratory: Negative for cough, sputum production and shortness of breath.   Cardiovascular: Positive for palpitations (with anxiety). Negative for chest pain and leg swelling.  Gastrointestinal: Negative for abdominal pain, constipation, diarrhea, heartburn and nausea.  Genitourinary: Negative for dysuria, frequency and urgency.  Musculoskeletal: Positive for back pain and myalgias. Negative for falls and joint pain.  Skin: Negative.   Neurological: Positive for dizziness (improved) and weakness. Negative for tingling, tremors, sensory change and headaches.  Psychiatric/Behavioral: Negative for depression and memory loss. The patient is nervous/anxious. The patient does not have insomnia.     Past Medical History:  Diagnosis Date  . Abdominal pain, epigastric   . Allergic rhinitis due to other allergen   . Allergic rhinitis, cause unspecified   . Dermatophytosis of nail   . Diffuse cystic mastopathy   . Diverticulosis of colon (without mention of hemorrhage)   .  Dysphagia, unspecified(787.20)   . Esophageal reflux   . Family history of malignant neoplasm of gastrointestinal tract   . Hiatal hernia   . Hyperlipidemia   . Hypertension   . Internal hemorrhoids without mention of complication   . 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 Obesity   . Oral aphthae   . Osteoarthrosis, unspecified whether generalized or localized, unspecified site   .  Other abnormal blood chemistry   . Other voice and resonance disorders   . Pain in joint, pelvic region and thigh   . Palpitations   . Pancreatitis   . Peripheral vascular disease, unspecified ([place])   . Schatzki's ring   . Senile osteoporosis   . Torus fracture of fibula   . Unspecified constipation   . Unspecified hypothyroidism    Past Surgical History:  Procedure Laterality Date  . APPENDECTOMY    . BASAL CELL CARCINOMA EXCISION     right humerus, right temple, left side of nose  . BASAL CELL CARCINOMA EXCISION     chest, left cheek  . CATARACT EXTRACTION, BILATERAL    . CHOLECYSTECTOMY    . keratosis     left arm  . resection of ampullary tumor    . squamous cell cancer     Right leg  . squamous cell cancer left triceps Left    Dr. Lorenza Cambridge  . TONSILLECTOMY AND ADENOIDECTOMY     Social History:   reports that she has never smoked. She has never used smokeless tobacco. She reports that she does not drink alcohol or use drugs.  Family History  Problem Relation Age of Onset  . Cancer Mother        spindle cell carcinoma on neck  . Colon cancer Father 18  . Breast cancer Sister   . Colon polyps Sister   . Heart disease Brother   . Breast cancer Unknown        neiece x 3  . Esophageal cancer Neg Hx   . Pancreatic cancer Neg Hx   . Kidney disease Neg Hx   . Liver disease Neg Hx     Medications: Patient's Medications  New Prescriptions   No medications on file  Previous Medications   ALPRAZOLAM (XANAX) 0.5 MG TABLET    Take half tablet three times daily as needed for anxiety.   ASPIRIN 81 MG TABLET    Take one tablet once daily to prevent stroke   BIMATOPROST (LUMIGAN) 0.01 % SOLN    Place 1 drop into both eyes at bedtime.   CALCIUM-MAGNESIUM-VITAMIN D [phone] MG-MG-UNIT TABS    Take one tablet once daily for supplement   FLUTICASONE (FLONASE) 50 MCG/ACT NASAL SPRAY    Place 1 spray into both nostrils daily.   GARLIC PO    Take 1 capsule by mouth daily.    GLUCOSAMINE-CHONDROITIN 500-400 MG TABLET    Take 1 tablet by mouth 3 (three) times daily.    IBUPROFEN (ADVIL,MOTRIN) 200 MG TABLET    Take 200 mg by mouth every 6 (six) hours as needed for mild pain.   INULIN (FIBER CHOICE PO)    Take 5 g by mouth 2 (two) times daily.   LEVOTHYROXINE (SYNTHROID, LEVOTHROID) 50 MCG TABLET    TAKE 1 TABLET DAILY FOR THYROID SUPPLEMENT   METOPROLOL (LOPRESSOR) 50 MG TABLET    One tablet daily to control BP   MULTIPLE VITAMIN (MULTIVITAMIN WITH MINERALS) TABS TABLET    Take 1 tablet by  mouth daily.   OMEGA-3 FATTY ACIDS (FISH OIL) 1200 MG CAPS    Take 1,200 mg by mouth 3 (three) times daily.    OMEPRAZOLE (PRILOSEC) 20 MG CAPSULE    Take one capsule by mouth once daily to reduce stomach acid.   PREDNISONE (DELTASONE) 5 MG TABLET    Take 5 mg by mouth 2 (two) times daily. Stop date 03/30/17  Modified Medications   No medications on file  Discontinued Medications   GABAPENTIN (NEURONTIN) 100 MG CAPSULE    gabapentin 100 mg capsule  Take 1 capsule twice a day by oral route around the clock for 10 days.   OVER THE COUNTER MEDICATION    Take 1 tablet by mouth daily. BONE CARE VITAMIN     Physical Exam:  Vitals:   03/27/17 1012  BP: 124/84  Pulse: 81  Resp: 17  Temp: 97.6 F (36.4 C)  TempSrc: Oral  SpO2: 98%  Weight: 154 lb 3.2 oz (69.9 kg)  Height: 5\' 3"  (1.6 m)   Body mass index is 27.32 kg/m.  Physical Exam  Constitutional: She is oriented to person, place, and time. She appears well-developed and well-nourished. No distress.  HENT:  Head: Normocephalic and atraumatic.  Right Ear: External ear normal.  Left Ear: External ear normal.  Nose: Nose normal.  Mouth/Throat: Oropharynx is clear and moist.  Eyes: Pupils are equal, round, and reactive to light. Conjunctivae and EOM are normal.  Corrective lenses  Neck: No JVD present. No tracheal deviation present. No thyromegaly present.  Cardiovascular: Normal rate, regular rhythm, normal heart  sounds and intact distal pulses.  Exam reveals no gallop and no friction rub.   No murmur heard. Pulmonary/Chest: Effort normal and breath sounds normal. No respiratory distress. She has no wheezes. She has no rales. She exhibits no tenderness.  Abdominal: Soft. Bowel sounds are normal. She exhibits no distension and no mass. There is no tenderness.  Musculoskeletal: Normal range of motion. She exhibits no edema or tenderness.  Neurological: She is alert and oriented to person, place, and time. She has normal reflexes. She displays normal reflexes. No cranial nerve deficit. Coordination normal.  Skin: No erythema. No pallor.  Psychiatric: She has a normal mood and affect. Her behavior is normal. Judgment and thought content normal.    Labs reviewed: Basic Metabolic Panel:  Recent Labs  07/02/16 0919 12/31/16 0917 02/26/17 1230  NA 141 141 142  K 4.3 4.1 4.0  CL 107 105 106  CO2 28 24 26   GLUCOSE 106* 106* 110*  BUN 14 15 15   CREATININE 0.70 0.67 0.71  CALCIUM 9.3 9.6 9.4  TSH 1.81 1.97 1.24   Liver Function Tests:  Recent Labs  07/02/16 0919 12/31/16 0917 02/26/17 1230  AST 19 19 21   ALT 15 15 14   ALKPHOS 38 43 42  BILITOT 0.7 0.7 0.7  PROT 6.2 7.0 6.8  ALBUMIN 3.9 4.2 4.2   No results for input(s): LIPASE, AMYLASE in the last 8760 hours. No results for input(s): AMMONIA in the last 8760 hours. CBC:  Recent Labs  02/26/17 1230  WBC 6.1  NEUTROABS 3,538  HGB 12.7  HCT 38.1  MCV 94.3  PLT 153   Lipid Panel:  Recent Labs  07/02/16 0919 12/31/16 0917  CHOL 231* 265*  HDL 72 87  LDLCALC 142* 163*  TRIG 85 74  CHOLHDL 3.2 3.0   TSH:  Recent Labs  07/02/16 0919 12/31/16 0917 02/26/17 1230  TSH 1.81 1.97 1.24  A1C: Lab Results  Component Value Date   HGBA1C 5.2 12/31/2016     Assessment/Plan 1. Weakness Has improved, stills feels uneasy at times. To cont proper hydration with good nutrition and PT. No further falls or pre/syncopal  episodes  2. Nausea Has improved since last visit. At that time we stopped some of her supplements. She wants to stop more supplements. Reviewed supplements in detail and will stop garlic, one of her calcium, vit d and mag (taking 2 supplements.) to cont bone health (with evening meal) which will give her adequate vit D and calcium in combination with her MVI.   3. Essential hypertension Blood pressure stable at this time. Reports feels of fogginess and lightheaded. Has not taking metoprolol yet today and taking metoprolol tartrate. Will Titrate off and have pt follow up in 2 weeks for blood pressure check.   4. Mildly obese -has lost weight, now Body mass index is 27.32 kg/m. -encouraged to cont eating 3 meals a day but reports she does not each much. To add ensure supplement after lunch, will monitor weight.    Follow up in 2 weeks.  Carlos American. Harle Battiest  [facility] [phone] 8 am - 5 pm) [phone] (after hours)

## 2017-04-02 DIAGNOSIS — M5441 Lumbago with sciatica, right side: Secondary | ICD-10-CM | POA: Diagnosis not present

## 2017-04-02 DIAGNOSIS — M545 Low back pain: Secondary | ICD-10-CM | POA: Diagnosis not present

## 2017-04-03 ENCOUNTER — Telehealth: Payer: Self-pay | Admitting: *Deleted

## 2017-04-03 DIAGNOSIS — I2510 Atherosclerotic heart disease of native coronary artery without angina pectoris: Secondary | ICD-10-CM | POA: Diagnosis not present

## 2017-04-03 DIAGNOSIS — I10 Essential (primary) hypertension: Secondary | ICD-10-CM | POA: Diagnosis not present

## 2017-04-03 DIAGNOSIS — M6281 Muscle weakness (generalized): Secondary | ICD-10-CM | POA: Diagnosis not present

## 2017-04-03 DIAGNOSIS — Z9181 History of falling: Secondary | ICD-10-CM | POA: Diagnosis not present

## 2017-04-03 NOTE — Telephone Encounter (Signed)
Tonya Russell with [facility] called and stated that patient's BP dropped during Orthostatics. No other concerns noted.   Sitting:  140/60 Standing: 100/60 Lying:  120/60  He stated he is only seeing patient for PT and wonders if he can get an order for Nursing to monitor her blood pressure. Please Advise.

## 2017-04-03 NOTE — Telephone Encounter (Signed)
Yes home health RN to eval pt for orthostatic hypotension is fine.  Hopefully PT can work with her on standing slowly to avoid falls with the bp drops until we can assess her.   I have not yet met this lady.  Looks like she sees [name] next week for her bp anyway.  I don't see her until October.

## 2017-04-03 NOTE — Telephone Encounter (Signed)
Tonya Russell with [facility] notified.

## 2017-04-05 ENCOUNTER — Telehealth: Payer: Self-pay | Admitting: *Deleted

## 2017-04-05 DIAGNOSIS — I2510 Atherosclerotic heart disease of native coronary artery without angina pectoris: Secondary | ICD-10-CM | POA: Diagnosis not present

## 2017-04-05 DIAGNOSIS — M6281 Muscle weakness (generalized): Secondary | ICD-10-CM | POA: Diagnosis not present

## 2017-04-05 DIAGNOSIS — I10 Essential (primary) hypertension: Secondary | ICD-10-CM | POA: Diagnosis not present

## 2017-04-05 DIAGNOSIS — Z9181 History of falling: Secondary | ICD-10-CM | POA: Diagnosis not present

## 2017-04-05 NOTE — Telephone Encounter (Signed)
Tonya Russell with [facility] called and stated that patient was complaining of Dizziness and Fluid in ears. Nurse did Orthostatics: Lying   142/68     P: 77 Sitting  138/72     P: 80 Standing 122/64  P 85  Patient has an appointment on 04/10/17.

## 2017-04-05 NOTE — Telephone Encounter (Signed)
She should drink plenty of water over the weekend until her appointment.  Then we can determine if her meds should be changed next week.  She should also be careful on standing and get up very slowly.

## 2017-04-08 NOTE — Telephone Encounter (Signed)
Patient notified and agreed.  

## 2017-04-10 ENCOUNTER — Encounter: Payer: Self-pay | Admitting: Nurse Practitioner

## 2017-04-10 ENCOUNTER — Telehealth: Payer: Self-pay

## 2017-04-10 ENCOUNTER — Ambulatory Visit (INDEPENDENT_AMBULATORY_CARE_PROVIDER_SITE_OTHER): Payer: Medicare Other | Admitting: Nurse Practitioner

## 2017-04-10 VITALS — BP 128/82 | HR 67 | Temp 97.5°F | Resp 17 | Ht 63.0 in | Wt 158.2 lb

## 2017-04-10 DIAGNOSIS — R531 Weakness: Secondary | ICD-10-CM | POA: Diagnosis not present

## 2017-04-10 DIAGNOSIS — R002 Palpitations: Secondary | ICD-10-CM | POA: Diagnosis not present

## 2017-04-10 DIAGNOSIS — F419 Anxiety disorder, unspecified: Secondary | ICD-10-CM

## 2017-04-10 DIAGNOSIS — R42 Dizziness and giddiness: Secondary | ICD-10-CM | POA: Diagnosis not present

## 2017-04-10 DIAGNOSIS — N3281 Overactive bladder: Secondary | ICD-10-CM

## 2017-04-10 DIAGNOSIS — I10 Essential (primary) hypertension: Secondary | ICD-10-CM

## 2017-04-10 MED ORDER — DULOXETINE HCL 30 MG PO CPEP: 30.0000 mg | ORAL_CAPSULE | Freq: Every day | ORAL | 3 refills | Status: DC

## 2017-04-10 NOTE — Patient Instructions (Addendum)
To start Cymbalta 30 mg by mouth daily for anxiety   To use Claritin (loratadine) 10 mg by mouth for ears/sinuses/dizzy   Follow up in 4 weeks with Dr Mariea Clonts   Will get CT of head and heart monitor for palpitation.

## 2017-04-10 NOTE — Progress Notes (Signed)
Careteam: Patient Care Team: Gayland Curry, DO as PCP - General (Geriatric Medicine) [name], MD as Consulting Physician (Gastroenterology) Lelon Perla, MD as Consulting Physician (Cardiology) Druscilla Brownie, MD as Consulting Physician (Dermatology) [name], MD as Consulting Physician (Ophthalmology)  Advanced Directive information Does Patient Have a Medical Advance Directive?: Yes, Type of Advance Directive: Living will;Out of facility DNR (pink MOST or yellow form)  Allergies  Allergen Reactions  . Codeine Nausea And Vomiting  . Lodine [Etodolac] Nausea And Vomiting  . Penicillins     unknown    Chief Complaint  Patient presents with  . Follow-up    Pt is being seen for a follow up on BP. Pt states that she is still very dizzy and head feels swimmy.     HPI: Patient is a 83 y.o. female seen in the office today to follow up blood pressure. Pt was titrated off metoprolol and home health taking blood pressures. Appears she did have slight orthostatic hypotension per home health nurse. Not currently on medication for blood pressure.   Reports she feels her inside shaky and trembling. Feels anxious. Even her fingers are quivering. alprazolam helpful for this.   Reports more lightheaded vs dizzy. Room is not spinning. Only happens for a second. Not when she is standing up. Also Reports odd feeling in her head, something comes over her head and this has been going on for years. States "something comes over her eyes and can not see." also has noticed worsening vision.  Went to ophthalmologist prior to fall (vision problems then) but vision had not changed much and they did not change her prescription.  Reports sometimes in the evening her vision and lightheaded will get better. Alprazolam also helps these symptoms.  Does reports dizziness when she turns her head from side to side. Vision problems then as well.   Also notes palpitations. Worse with anxiety  but happens at least daily. No worse since she has been off metoprolol that she can tell.   Still having occasional nusea.  Trying to eat regularly.  No constipation.   Has CT of head when she was having these symptoms in the past but symptoms are worse now.   Worsening of incontinence more frequent episodes at night which has been going on for months. Has been drinking more water lately (because everyone keeps saying to drink water) so it is worse.  Not sleeping good due to frequency in urination and then she can not go back to sleep.  Getting up 3-4 times at night.   Getting MRI of back tomorrow per ortho due to weakness of lower extremities and lower back pain. Also with numbness to right leg.    Review of Systems:  Review of Systems  Constitutional: Positive for malaise/fatigue (improved but still there). Negative for chills, fever and weight loss.  HENT: Negative for congestion, ear discharge, ear pain, hearing loss, sinus pain, sore throat and tinnitus.   Eyes: Positive for blurred vision. Negative for double vision, pain and discharge.  Respiratory: Negative for cough, sputum production and shortness of breath.   Cardiovascular: Negative for chest pain, palpitations and leg swelling.  Gastrointestinal: Negative for abdominal pain, constipation, diarrhea, heartburn and nausea.  Genitourinary: Negative for dysuria, frequency and urgency.  Musculoskeletal: Positive for back pain and myalgias. Negative for falls and joint pain.  Skin: Negative.   Neurological: Positive for dizziness (improved) and weakness. Negative for tingling, tremors, sensory change and headaches.  Psychiatric/Behavioral:  Negative for depression and memory loss. The patient is nervous/anxious. The patient does not have insomnia.    Past Medical History:  Diagnosis Date  . Abdominal pain, epigastric   . Allergic rhinitis due to other allergen   . Allergic rhinitis, cause unspecified   . Dermatophytosis of nail    . Diffuse cystic mastopathy   . Diverticulosis of colon (without mention of hemorrhage)   . Dysphagia, unspecified(787.20)   . Esophageal reflux   . Family history of malignant neoplasm of gastrointestinal tract   . Hiatal hernia   . Hyperlipidemia   . Hypertension   . Internal hemorrhoids without mention of complication   . 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 Obesity   . Oral aphthae   . Osteoarthrosis, unspecified whether generalized or localized, unspecified site   . Other abnormal blood chemistry   . Other voice and resonance disorders   . Pain in joint, pelvic region and thigh   . Palpitations   . Pancreatitis   . Peripheral vascular disease, unspecified ([place])   . Schatzki's ring   . Senile osteoporosis   . Torus fracture of fibula   . Unspecified constipation   . Unspecified hypothyroidism    Past Surgical History:  Procedure Laterality Date  . APPENDECTOMY    . BASAL CELL CARCINOMA EXCISION     right humerus, right temple, left side of nose  . BASAL CELL CARCINOMA EXCISION     chest, left cheek  . CATARACT EXTRACTION, BILATERAL    . CHOLECYSTECTOMY    . keratosis     left arm  . resection of ampullary tumor    . squamous cell cancer     Right leg  . squamous cell cancer left triceps Left    Dr. Lorenza Cambridge  . TONSILLECTOMY AND ADENOIDECTOMY     Social History:   reports that she has never smoked. She has never used smokeless tobacco. She reports that she does not drink alcohol or use drugs.  Family History  Problem Relation Age of Onset  . Cancer Mother        spindle cell carcinoma on neck  . Colon cancer Father 43  . Breast cancer Sister   . Colon polyps Sister   . Heart disease Brother   . Breast cancer Unknown        neiece x 3  . Esophageal cancer Neg Hx   . Pancreatic cancer Neg Hx   . Kidney disease Neg Hx   . Liver disease Neg Hx     Medications: Patient's Medications  New Prescriptions   DULOXETINE (CYMBALTA) 30 MG CAPSULE     Take 1 capsule (30 mg total) by mouth daily.  Previous Medications   ALPRAZOLAM (XANAX) 0.5 MG TABLET    Take half tablet three times daily as needed for anxiety.   ASPIRIN 81 MG TABLET    Take one tablet once daily to prevent stroke   BIMATOPROST (LUMIGAN) 0.01 % SOLN    Place 1 drop into both eyes at bedtime.   IBUPROFEN (ADVIL,MOTRIN) 200 MG TABLET    Take 200 mg by mouth every 6 (six) hours as needed for mild pain.   INULIN (FIBER CHOICE PO)    Take 5 g by mouth 2 (two) times daily.   LEVOTHYROXINE (SYNTHROID, LEVOTHROID) 50 MCG TABLET    TAKE 1 TABLET DAILY FOR THYROID SUPPLEMENT   MULTIPLE VITAMIN (MULTIVITAMIN WITH MINERALS) TABS TABLET    Take 1 tablet  by mouth daily.   OMEGA-3 FATTY ACIDS (FISH OIL) 1200 MG CAPS    Take 1,200 mg by mouth 3 (three) times daily.    OMEPRAZOLE (PRILOSEC) 20 MG CAPSULE    Take one capsule by mouth once daily to reduce stomach acid.   UNABLE TO FIND    Bone Care: Calcium, magnesium, vitamins D3 and K: Take one tablet by mouth every evening.  Modified Medications   No medications on file  Discontinued Medications   FLUTICASONE (FLONASE) 50 MCG/ACT NASAL SPRAY    Place 1 spray into both nostrils daily.   GLUCOSAMINE-CHONDROITIN 500-400 MG TABLET    Take 1 tablet by mouth 3 (three) times daily.    METOPROLOL (LOPRESSOR) 50 MG TABLET    One tablet daily to control BP   PREDNISONE (DELTASONE) 5 MG TABLET    Take 5 mg by mouth 2 (two) times daily. Stop date 03/30/17     Physical Exam:  Vitals:   04/10/17 0942  BP: 128/82  Pulse: 67  Resp: 17  Temp: (!) 97.5 F (36.4 C)  TempSrc: Oral  SpO2: 98%  Weight: 158 lb 3.2 oz (71.8 kg)  Height: 5\' 3"  (1.6 m)   Body mass index is 28.02 kg/m.  Physical Exam  Constitutional: She is oriented to person, place, and time. She appears well-developed and well-nourished. No distress.  HENT:  Head: Normocephalic and atraumatic.  Right Ear: Tympanic membrane, external ear and ear canal normal.  Left Ear:  Tympanic membrane, external ear and ear canal normal.  Nose: Nose normal.  Mouth/Throat: Oropharynx is clear and moist.  Eyes: Pupils are equal, round, and reactive to light. Conjunctivae and EOM are normal.  Corrective lenses  Neck: No JVD present. No tracheal deviation present. No thyromegaly present.  Cardiovascular: Normal rate, regular rhythm, normal heart sounds and intact distal pulses.  Exam reveals no gallop and no friction rub.   No murmur heard. Pulmonary/Chest: Effort normal and breath sounds normal. No respiratory distress. She has no wheezes. She has no rales. She exhibits no tenderness.  Abdominal: Soft. Bowel sounds are normal. She exhibits no distension and no mass. There is no tenderness.  Musculoskeletal: Normal range of motion. She exhibits no edema or tenderness.  Neurological: She is alert and oriented to person, place, and time. She has normal reflexes. She displays normal reflexes. No cranial nerve deficit. Coordination normal.  Skin: No erythema. No pallor.  Psychiatric: She has a normal mood and affect. Her behavior is normal. Judgment and thought content normal.    Labs reviewed: Basic Metabolic Panel:  Recent Labs  07/02/16 0919 12/31/16 0917 02/26/17 1230  NA 141 141 142  K 4.3 4.1 4.0  CL 107 105 106  CO2 28 24 26   GLUCOSE 106* 106* 110*  BUN 14 15 15   CREATININE 0.70 0.67 0.71  CALCIUM 9.3 9.6 9.4  TSH 1.81 1.97 1.24   Liver Function Tests:  Recent Labs  07/02/16 0919 12/31/16 0917 02/26/17 1230  AST 19 19 21   ALT 15 15 14   ALKPHOS 38 43 42  BILITOT 0.7 0.7 0.7  PROT 6.2 7.0 6.8  ALBUMIN 3.9 4.2 4.2   No results for input(s): LIPASE, AMYLASE in the last 8760 hours. No results for input(s): AMMONIA in the last 8760 hours. CBC:  Recent Labs  02/26/17 1230  WBC 6.1  NEUTROABS 3,538  HGB 12.7  HCT 38.1  MCV 94.3  PLT 153   Lipid Panel:  Recent Labs  07/02/16 0919 12/31/16  0917  CHOL 231* 265*  HDL 72 87  LDLCALC 142*  163*  TRIG 85 74  CHOLHDL 3.2 3.0   TSH:  Recent Labs  07/02/16 0919 12/31/16 0917 02/26/17 1230  TSH 1.81 1.97 1.24   A1C: Lab Results  Component Value Date   HGBA1C 5.2 12/31/2016     Assessment/Plan 1. Lightheadedness - CT Head Wo Contrast discussed with pt due to being lightheaded, visual changes and weakness however pt got very tearful when discussed having a CT of her head done because she does drive and finds it difficult to find transportation. Very anxious about going places.   2. Anxiety Worsening anxiety, lives along and does not have children. Reports her sisters are not close and can not help her.  -using alprazolam daily which has been helping  -will start Cymbalta for better control on anxiety.  - DULoxetine (CYMBALTA) 30 MG capsule; Take 1 capsule (30 mg total) by mouth daily.  Dispense: 30 capsule; Refill: 3  3. Palpitations -ongoing - consider Holter monitor at follow up visit. Discussed this with pt today and she starting crying stating she has no one to take her to get these things done. Request to hold off on this.   4. Essential hypertension Stable, off metoprolol. Orthostatic hypotension noted. Pt aware to change positions slowly.   5. Weakness Stable, has been working with PT   6. Overactive bladder May benefit from myrbetriq due to frequent urination,  however starting cymbalta at this time.   7. Vertigo -will try Claritin 10 mg daily to see if this helps symptoms.    Based on neurologic and cardiac symptoms CT scan and cardiac monitor is needed for further evaluation. However pt was very overwhelmed at the thought of having to do this at this time. Discussed in details the reasons these test were needed and pt understood but states she had no transportation or help.  Call placed to home health and plan to get SW to help with community resource to help with transportation and support.  Also discussed assisting living however pt is adamantly  against moving.  To follow up with Dr Mariea Clonts in 4 weeks.  Total time 60 mins:  time greater than 50% of total time spent doing pt counseling and coordination of care.   Carlos American. Harle Battiest  [facility] [phone] 8 am - 5 pm) [phone] (after hours)

## 2017-04-10 NOTE — Telephone Encounter (Signed)
This is great. Okay for nursing twice weekly. Maybe once they can help with transportation she will be more willing to get testing done

## 2017-04-10 NOTE — Telephone Encounter (Signed)
I spoke with Lexine Baton at [facility] to ask about getting social worker involved with patient's care. Lexine Baton stated that it will be a few weeks before the social worker is available but in the mean time, Lexine Baton is going to work on getting patient transportation to and from appointments and she would inform nursing team who went out to visit patient about patient's emotional needs. She stated that nursing staff would encourage patient to open up and speak with them more about what is bothering her in order to see how they can help.   Lexine Baton stated that patient would be placed in social worker's list to be seen ASAP.   Lexine Baton did ask if nursing could increase the number of nursing visit per week to 2. Nursing is currently only going out once weekly.

## 2017-04-11 DIAGNOSIS — Z9181 History of falling: Secondary | ICD-10-CM | POA: Diagnosis not present

## 2017-04-11 DIAGNOSIS — I10 Essential (primary) hypertension: Secondary | ICD-10-CM | POA: Diagnosis not present

## 2017-04-11 DIAGNOSIS — M545 Low back pain: Secondary | ICD-10-CM | POA: Diagnosis not present

## 2017-04-11 DIAGNOSIS — M6281 Muscle weakness (generalized): Secondary | ICD-10-CM | POA: Diagnosis not present

## 2017-04-11 DIAGNOSIS — I2510 Atherosclerotic heart disease of native coronary artery without angina pectoris: Secondary | ICD-10-CM | POA: Diagnosis not present

## 2017-04-12 DIAGNOSIS — M6281 Muscle weakness (generalized): Secondary | ICD-10-CM | POA: Diagnosis not present

## 2017-04-12 DIAGNOSIS — I10 Essential (primary) hypertension: Secondary | ICD-10-CM | POA: Diagnosis not present

## 2017-04-12 DIAGNOSIS — Z9181 History of falling: Secondary | ICD-10-CM | POA: Diagnosis not present

## 2017-04-12 DIAGNOSIS — I2510 Atherosclerotic heart disease of native coronary artery without angina pectoris: Secondary | ICD-10-CM | POA: Diagnosis not present

## 2017-04-18 DIAGNOSIS — M6281 Muscle weakness (generalized): Secondary | ICD-10-CM | POA: Diagnosis not present

## 2017-04-18 DIAGNOSIS — Z9181 History of falling: Secondary | ICD-10-CM | POA: Diagnosis not present

## 2017-04-18 DIAGNOSIS — I10 Essential (primary) hypertension: Secondary | ICD-10-CM | POA: Diagnosis not present

## 2017-04-18 DIAGNOSIS — I2510 Atherosclerotic heart disease of native coronary artery without angina pectoris: Secondary | ICD-10-CM | POA: Diagnosis not present

## 2017-04-22 ENCOUNTER — Telehealth: Payer: Self-pay

## 2017-04-22 NOTE — Telephone Encounter (Signed)
Patient called because wanted to let [name] know that she is really lightheaded and dizzy (mostly in the mornings) and patient believes it is coming from duloxetine she was put on. Patient stated that her symptoms may be coming from her sinuses as well, she reports congestion, ear pressure, watery eyes.   She would like to know what provider thinks? Please advise.

## 2017-04-22 NOTE — Telephone Encounter (Signed)
I spoke with patient and she verbalized understanding. She stated that she would call the office if changing medication times around does not help the dizziness.

## 2017-04-22 NOTE — Telephone Encounter (Signed)
Pt has been having the lightheaded and dizziness Recommended CT of the head and heart monitor at previous OV but she did not wish to have this done due to transportation issues To continue duloxetine at this time (may take medication at night if she wants)  Can use Claritin ( loratadine ) 10 mg daily if she feels like she is having allergies

## 2017-04-23 ENCOUNTER — Other Ambulatory Visit: Payer: Self-pay | Admitting: *Deleted

## 2017-04-23 DIAGNOSIS — E785 Hyperlipidemia, unspecified: Secondary | ICD-10-CM

## 2017-04-23 DIAGNOSIS — I10 Essential (primary) hypertension: Secondary | ICD-10-CM

## 2017-04-24 DIAGNOSIS — I10 Essential (primary) hypertension: Secondary | ICD-10-CM | POA: Diagnosis not present

## 2017-04-24 DIAGNOSIS — I2510 Atherosclerotic heart disease of native coronary artery without angina pectoris: Secondary | ICD-10-CM | POA: Diagnosis not present

## 2017-04-24 DIAGNOSIS — M6281 Muscle weakness (generalized): Secondary | ICD-10-CM | POA: Diagnosis not present

## 2017-04-24 DIAGNOSIS — Z9181 History of falling: Secondary | ICD-10-CM | POA: Diagnosis not present

## 2017-04-25 ENCOUNTER — Telehealth: Payer: Self-pay | Admitting: *Deleted

## 2017-04-25 DIAGNOSIS — M545 Low back pain: Secondary | ICD-10-CM | POA: Diagnosis not present

## 2017-04-25 DIAGNOSIS — M5441 Lumbago with sciatica, right side: Secondary | ICD-10-CM | POA: Diagnosis not present

## 2017-04-25 DIAGNOSIS — M5442 Lumbago with sciatica, left side: Secondary | ICD-10-CM | POA: Diagnosis not present

## 2017-04-25 NOTE — Telephone Encounter (Signed)
Marlowe Kays with [facility] called and stated that they are discharging patient today from Home[facility]. Stated that the patient is stable. Marlowe Kays, nurse, wanted to let you know that patient lives in a small mobile home that is pretty old. No air conditioning but does have a couple of fans. Nurse discussed with patient about getting a window unit [facility] but patient stated that she does not feel hot and does not want to get [facility]. The thermostat for her heat system reads 91* inside home.

## 2017-05-03 DIAGNOSIS — M48062 Spinal stenosis, lumbar region with neurogenic claudication: Secondary | ICD-10-CM | POA: Diagnosis not present

## 2017-05-09 ENCOUNTER — Ambulatory Visit (INDEPENDENT_AMBULATORY_CARE_PROVIDER_SITE_OTHER): Payer: Medicare Other | Admitting: Internal Medicine

## 2017-05-09 ENCOUNTER — Encounter: Payer: Self-pay | Admitting: Internal Medicine

## 2017-05-09 VITALS — BP 146/87 | HR 75 | Temp 98.4°F | Ht 63.0 in | Wt 158.0 lb

## 2017-05-09 DIAGNOSIS — Z23 Encounter for immunization: Secondary | ICD-10-CM | POA: Diagnosis not present

## 2017-05-09 DIAGNOSIS — M48062 Spinal stenosis, lumbar region with neurogenic claudication: Secondary | ICD-10-CM

## 2017-05-09 DIAGNOSIS — E2839 Other primary ovarian failure: Secondary | ICD-10-CM | POA: Diagnosis not present

## 2017-05-09 DIAGNOSIS — J320 Chronic maxillary sinusitis: Secondary | ICD-10-CM | POA: Diagnosis not present

## 2017-05-09 DIAGNOSIS — R42 Dizziness and giddiness: Secondary | ICD-10-CM | POA: Diagnosis not present

## 2017-05-09 DIAGNOSIS — F419 Anxiety disorder, unspecified: Secondary | ICD-10-CM | POA: Diagnosis not present

## 2017-05-09 MED ORDER — AZITHROMYCIN 250 MG PO TABS: ORAL_TABLET | ORAL | 0 refills | Status: DC

## 2017-05-09 NOTE — Progress Notes (Signed)
Location:  [facility] clinic Provider:  Woods Gangemi L. Mariea Clonts, D.O., C.M.D.  Code Status: DNR Goals of Care:  Advanced Directives 04/10/2017  Does Patient Have a Medical Advance Directive? Yes  Type of Advance Directive Living will;Out of facility DNR (pink MOST or yellow form)  Does patient want to make changes to medical advance directive? -  Copy of [place] in Chart? -  Pre-existing out of facility DNR order (yellow form or pink MOST form) -    Chief Complaint  Patient presents with  . Follow-up    Follow-up on anxiety, allergies, and dizziness (b/p medication was stopped to see if cause of dizziness, patient still with dizziness)   . Health Maintenance    Bone Density order placed   . Immunizations    Flu vaccine to be given today, discuss need for pneumonia vaccine as indicated on KPN     HPI: Patient is a 83 y.o. female seen today for medical management of chronic diseases.    She's on the cymbalta instead of alprazolam.  Says she's less dizzy after moving claritin to morning and cymbalta in evening.   Still feels like eyes are blurry and dizzy on standing.   Says she was also orthostatic.  Says she blacked out and felt the room spinning around back in June when all of this began.  Has trouble clearing her throat.  Feels like there's mucus in her throat on both sides.  Ears feel full.  Has been blowing her nose, but they don't fully clear with that or popping them with yawning.  Isn't sneezing and only has watery mucus lately in nose.  Right leg hurts plum to her foot.  Is for a shot in her back 9/13.  She brought the mri report which will get scanned from ortho.  Uses her cane outside of the house.    Vision doing fine.  Sometimes eyes are foggy watching TV.  Is on lumigan.  Needs flu shot.    Past Medical History:  Diagnosis Date  . Abdominal pain, epigastric   . Allergic rhinitis due to other allergen   . Allergic rhinitis, cause unspecified   .  Dermatophytosis of nail   . Diffuse cystic mastopathy   . Diverticulosis of colon (without mention of hemorrhage)   . Dysphagia, unspecified(787.20)   . Esophageal reflux   . Family history of malignant neoplasm of gastrointestinal tract   . Hiatal hernia   . Hyperlipidemia   . Hypertension   . Internal hemorrhoids without mention of complication   . 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 Obesity   . Oral aphthae   . Osteoarthrosis, unspecified whether generalized or localized, unspecified site   . Other abnormal blood chemistry   . Other voice and resonance disorders   . Pain in joint, pelvic region and thigh   . Palpitations   . Pancreatitis   . Peripheral vascular disease, unspecified ([place])   . Schatzki's ring   . Senile osteoporosis   . Torus fracture of fibula   . Unspecified constipation   . Unspecified hypothyroidism     Past Surgical History:  Procedure Laterality Date  . APPENDECTOMY    . BASAL CELL CARCINOMA EXCISION     right humerus, right temple, left side of nose  . BASAL CELL CARCINOMA EXCISION     chest, left cheek  . CATARACT EXTRACTION, BILATERAL    . CHOLECYSTECTOMY    . keratosis  left arm  . resection of ampullary tumor    . squamous cell cancer     Right leg  . squamous cell cancer left triceps Left    Dr. Lorenza Cambridge  . TONSILLECTOMY AND ADENOIDECTOMY      Allergies  Allergen Reactions  . Codeine Nausea And Vomiting  . Lodine [Etodolac] Nausea And Vomiting  . Penicillins     unknown    Allergies as of 05/09/2017      Reactions   Codeine Nausea And Vomiting   Lodine [etodolac] Nausea And Vomiting   Penicillins    unknown      Medication List       Accurate as of 05/09/17 12:20 PM. Always use your most recent med list.          ALPRAZolam 0.5 MG tablet Commonly known as:  XANAX Take half tablet three times daily as needed for anxiety.   aspirin 81 MG tablet Take one tablet once daily to prevent stroke     bimatoprost 0.01 % Soln Commonly known as:  LUMIGAN Place 1 drop into both eyes at bedtime.   DULoxetine 30 MG capsule Commonly known as:  CYMBALTA Take 1 capsule (30 mg total) by mouth daily.   FIBER CHOICE PO Take 5 g by mouth 2 (two) times daily.   Fish Oil 1200 MG Caps Take 1,200 mg by mouth 3 (three) times daily.   fluticasone 50 MCG/ACT nasal spray Commonly known as:  FLONASE Place 1 spray into both nostrils at bedtime.   ibuprofen 200 MG tablet Commonly known as:  ADVIL,MOTRIN Take 200 mg by mouth every 6 (six) hours as needed for mild pain.   levothyroxine 50 MCG tablet Commonly known as:  SYNTHROID, LEVOTHROID TAKE 1 TABLET DAILY FOR THYROID SUPPLEMENT   loratadine 10 MG tablet Commonly known as:  CLARITIN Take 10 mg by mouth daily.   multivitamin with minerals Tabs tablet Take 1 tablet by mouth daily.   omeprazole 20 MG capsule Commonly known as:  PRILOSEC Take one capsule by mouth once daily to reduce stomach acid.   SYSTANE ULTRA OP Apply 1 drop to eye as needed. Both eyes for dry eyes   UNABLE TO FIND Bone Care: Calcium, magnesium, vitamins D3 and K: Take one tablet by mouth every evening.       Review of Systems:  Review of Systems  Constitutional: Negative for chills, fever and malaise/fatigue.  HENT: Positive for congestion, ear pain and sinus pain. Negative for sore throat.   Eyes: Positive for blurred vision.       Glasses  Respiratory: Negative for shortness of breath.   Cardiovascular: Negative for chest pain, palpitations and leg swelling.  Gastrointestinal: Positive for heartburn. Negative for abdominal pain.  Genitourinary: Negative for dysuria.  Musculoskeletal: Positive for falls.  Skin: Negative for itching and rash.  Neurological: Negative for dizziness, loss of consciousness and weakness.  Endo/Heme/Allergies: Bruises/bleeds easily.  Psychiatric/Behavioral: Positive for memory loss. The patient is nervous/anxious.     Health  Maintenance  Topic Date Due  . DEXA SCAN  10/15/1991  . INFLUENZA VACCINE  04/10/2017  . PNA vac Low Risk Adult (2 of 2 - PCV13) 07/04/2017  . TETANUS/TDAP  09/10/2017  . MAMMOGRAM  01/29/2018    Physical Exam: Vitals:   05/09/17 1142  BP: 122/70  Pulse: 75  Temp: 98.4 F (36.9 C)  TempSrc: Oral  SpO2: 96%  Weight: 158 lb (71.7 kg)  Height: 5\' 3"  (1.6 m)  Body mass index is 27.99 kg/m. Physical Exam  Constitutional: She is oriented to person, place, and time. She appears well-developed and well-nourished. No distress.  HENT:  Right Ear: External ear normal.  Left Ear: External ear normal.  Mouth/Throat: Oropharyngeal exudate present.  Sinus tenderness and congestion  Cardiovascular: Normal rate, regular rhythm, normal heart sounds and intact distal pulses.   Pulmonary/Chest: Effort normal and breath sounds normal. No respiratory distress.  Abdominal: Soft. Bowel sounds are normal. She exhibits no distension. There is no tenderness.  Musculoskeletal: Normal range of motion.  Neurological: She is alert and oriented to person, place, and time.  Skin: Skin is warm and dry.  Psychiatric: She has a normal mood and affect.    Labs reviewed: Basic Metabolic Panel:  Recent Labs  07/02/16 0919 12/31/16 0917 02/26/17 1230  NA 141 141 142  K 4.3 4.1 4.0  CL 107 105 106  CO2 28 24 26   GLUCOSE 106* 106* 110*  BUN 14 15 15   CREATININE 0.70 0.67 0.71  CALCIUM 9.3 9.6 9.4  TSH 1.81 1.97 1.24   Liver Function Tests:  Recent Labs  07/02/16 0919 12/31/16 0917 02/26/17 1230  AST 19 19 21   ALT 15 15 14   ALKPHOS 38 43 42  BILITOT 0.7 0.7 0.7  PROT 6.2 7.0 6.8  ALBUMIN 3.9 4.2 4.2   No results for input(s): LIPASE, AMYLASE in the last 8760 hours. No results for input(s): AMMONIA in the last 8760 hours. CBC:  Recent Labs  02/26/17 1230  WBC 6.1  NEUTROABS 3,538  HGB 12.7  HCT 38.1  MCV 94.3  PLT 153   Lipid Panel:  Recent Labs  07/02/16 0919  12/31/16 0917  CHOL 231* 265*  HDL 72 87  LDLCALC 142* 163*  TRIG 85 74  CHOLHDL 3.2 3.0   Lab Results  Component Value Date   HGBA1C 5.2 12/31/2016    Assessment/Plan 1. Lightheadedness -seems mix of orthostatic hypotension (was orthostatic today with bp) and sinus infection -will tx sinuses due to ongoing symptoms for weeks to mos and see if there's improvement -also encouraged hydration b/c she does not drink much water and does not have AC in her trailer per home health notes  2. Chronic maxillary sinusitis - azithromycin (ZITHROMAX Z-PAK) 250 MG tablet; 2 tabs today, then one daily for 4 days  Dispense: 6 each; Refill: 0  3. Anxiety -does not seem bad today and has been off xanax so continue off of it to eliminate one potential cause of dizziness and falls, cont cymbalta which she tolerates  4. Estrogen deficiency - DG Bone Density; Future--she agrees to get this done and reports she was actually in a study but thinks she had the placebo back then?  5. Spinal stenosis of lumbar region with neurogenic claudication -ongoing chronic pain, getting shot from ortho coming up next month  6. Need for influenza vaccination - Flu vaccine HIGH DOSE PF (Fluzone High dose) given  Labs/tests ordered:   Orders Placed This Encounter  Procedures  . DG Bone Density    Standing Status:   Future    Standing Expiration Date:   07/09/2018    Order Specific Question:   Reason for Exam (SYMPTOM  OR DIAGNOSIS REQUIRED)    Answer:   postmenopausal, estrogen deficient    Order Specific Question:   Preferred imaging location?    Answer:   [facility]  . Flu vaccine HIGH DOSE PF (Fluzone High dose)   Next appt:  07/01/2017 med mgt   Costantino Kohlbeck L. Lalita Ebel, D.O. [facility] [address], [place] [address] Cell Phone (Mon-Fri 8am-5pm):  [phone] On Call:  [phone] & follow prompts after 5pm & weekends Office Phone:   [phone] Office Fax:  [phone]

## 2017-05-09 NOTE — Patient Instructions (Addendum)
Take zpak antibiotic for 5 days as ordered.   Be sure to drink plenty of water (8 8oz glasses at least per day) which will help congestion and dizziness.   Continue to use your cane for balance.

## 2017-05-15 ENCOUNTER — Telehealth: Payer: Self-pay | Admitting: *Deleted

## 2017-05-15 NOTE — Telephone Encounter (Signed)
Referral notes sent to scheduling from Sherrie Mustache NP

## 2017-05-21 ENCOUNTER — Ambulatory Visit
Admission: RE | Admit: 2017-05-21 | Discharge: 2017-05-21 | Disposition: A | Discharge status: Home / Self Care | Payer: Medicare Other | Source: Ambulatory Visit | Attending: Internal Medicine | Admitting: Internal Medicine

## 2017-05-21 DIAGNOSIS — E2839 Other primary ovarian failure: Secondary | ICD-10-CM

## 2017-05-21 DIAGNOSIS — Z780 Asymptomatic menopausal state: Secondary | ICD-10-CM | POA: Diagnosis not present

## 2017-05-21 DIAGNOSIS — M810 Age-related osteoporosis without current pathological fracture: Secondary | ICD-10-CM | POA: Diagnosis not present

## 2017-05-23 ENCOUNTER — Telehealth: Payer: Self-pay | Admitting: Internal Medicine

## 2017-05-23 DIAGNOSIS — M48062 Spinal stenosis, lumbar region with neurogenic claudication: Secondary | ICD-10-CM | POA: Diagnosis not present

## 2017-05-23 NOTE — Telephone Encounter (Signed)
I left a message asking the patient to confirm if she can do AWV-S after 07/01/17 lab appointment.  I also explained that I wanted to follow up on transportation referral and asked her to call me at [phone]. VDM (DD)

## 2017-05-24 ENCOUNTER — Other Ambulatory Visit: Payer: Self-pay | Admitting: *Deleted

## 2017-05-24 ENCOUNTER — Encounter: Payer: Self-pay | Admitting: *Deleted

## 2017-05-24 MED ORDER — ALENDRONATE SODIUM 70 MG PO TABS: 70.0000 mg | ORAL_TABLET | ORAL | 11 refills | Status: DC

## 2017-05-27 ENCOUNTER — Telehealth: Payer: Self-pay | Admitting: *Deleted

## 2017-05-27 NOTE — Telephone Encounter (Signed)
Noted.  Tonya Russell actually referred her at her last visit with Ms. Cuadros.  Will notify her.

## 2017-05-27 NOTE — Telephone Encounter (Signed)
Tonya Russell with [facility] called and stated that they have been trying to contact patient over a month to schedule a 48hour heart monitor and finally she answered today and DECLINED the test. She told them that she will discuss with provider at next [place].

## 2017-06-01 ENCOUNTER — Other Ambulatory Visit: Payer: Self-pay | Admitting: Internal Medicine

## 2017-06-01 DIAGNOSIS — E039 Hypothyroidism, unspecified: Secondary | ICD-10-CM

## 2017-06-03 ENCOUNTER — Other Ambulatory Visit: Payer: Self-pay | Admitting: *Deleted

## 2017-06-03 DIAGNOSIS — F419 Anxiety disorder, unspecified: Secondary | ICD-10-CM

## 2017-06-03 MED ORDER — DULOXETINE HCL 30 MG PO CPEP: 30.0000 mg | ORAL_CAPSULE | Freq: Every day | ORAL | 3 refills | Status: DC

## 2017-06-03 MED ORDER — ALENDRONATE SODIUM 70 MG PO TABS: 70.0000 mg | ORAL_TABLET | ORAL | 3 refills | Status: DC

## 2017-06-03 NOTE — Telephone Encounter (Signed)
Patient requested Rx to be sent to [facility].

## 2017-06-21 DIAGNOSIS — M48062 Spinal stenosis, lumbar region with neurogenic claudication: Secondary | ICD-10-CM | POA: Diagnosis not present

## 2017-07-01 ENCOUNTER — Other Ambulatory Visit: Payer: Medicare Other

## 2017-07-01 ENCOUNTER — Ambulatory Visit: Payer: Medicare Other

## 2017-07-01 DIAGNOSIS — E785 Hyperlipidemia, unspecified: Secondary | ICD-10-CM

## 2017-07-01 DIAGNOSIS — I10 Essential (primary) hypertension: Secondary | ICD-10-CM | POA: Diagnosis not present

## 2017-07-01 DIAGNOSIS — R739 Hyperglycemia, unspecified: Secondary | ICD-10-CM | POA: Diagnosis not present

## 2017-07-02 ENCOUNTER — Encounter: Payer: Self-pay | Admitting: Internal Medicine

## 2017-07-02 ENCOUNTER — Other Ambulatory Visit: Payer: Self-pay | Admitting: Internal Medicine

## 2017-07-02 LAB — LIPID PANEL
Cholesterol: 265 mg/dL — ABNORMAL HIGH (ref <200)
HDL: 93 mg/dL (ref >50)
LDL Cholesterol (Calc): 154 mg/dL (calc) — ABNORMAL HIGH
Non-HDL Cholesterol (Calc): 172 mg/dL (calc) — ABNORMAL HIGH (ref <130)
Total CHOL/HDL Ratio: 2.8 (calc) (ref <5.0)
Triglycerides: 75 mg/dL (ref <150)

## 2017-07-02 LAB — COMPREHENSIVE METABOLIC PANEL
AG Ratio: 1.6 (calc) (ref 1.0–2.5)
ALT: 15 U/L (ref 6–29)
AST: 18 U/L (ref 10–35)
Albumin: 3.9 g/dL (ref 3.6–5.1)
Alkaline phosphatase (APISO): 54 U/L (ref 33–130)
BUN: 12 mg/dL (ref 7–25)
CO2: 32 mmol/L (ref 20–32)
Calcium: 9 mg/dL (ref 8.6–10.4)
Chloride: 102 mmol/L (ref 98–110)
Creat: 0.66 mg/dL (ref 0.60–0.88)
Globulin: 2.5 g/dL (calc) (ref 1.9–3.7)
Glucose, Bld: 107 mg/dL — ABNORMAL HIGH (ref 65–99)
Potassium: 3.9 mmol/L (ref 3.5–5.3)
Sodium: 138 mmol/L (ref 135–146)
Total Bilirubin: 0.6 mg/dL (ref 0.2–1.2)
Total Protein: 6.4 g/dL (ref 6.1–8.1)

## 2017-07-02 LAB — HEMOGLOBIN A1C
Hgb A1c MFr Bld: 5.3 % of total Hgb (ref <5.7)
Mean Plasma Glucose: 105 (calc)
eAG (mmol/L): 5.8 (calc)

## 2017-07-05 ENCOUNTER — Ambulatory Visit: Payer: Medicare Other | Admitting: Internal Medicine

## 2017-07-08 ENCOUNTER — Ambulatory Visit
Admission: RE | Admit: 2017-07-08 | Discharge: 2017-07-08 | Disposition: A | Discharge status: Home / Self Care | Payer: Medicare Other | Source: Ambulatory Visit | Attending: Internal Medicine | Admitting: Internal Medicine

## 2017-07-08 ENCOUNTER — Encounter: Payer: Self-pay | Admitting: Internal Medicine

## 2017-07-08 ENCOUNTER — Ambulatory Visit (INDEPENDENT_AMBULATORY_CARE_PROVIDER_SITE_OTHER): Payer: Medicare Other

## 2017-07-08 ENCOUNTER — Ambulatory Visit (INDEPENDENT_AMBULATORY_CARE_PROVIDER_SITE_OTHER): Payer: Medicare Other | Admitting: Internal Medicine

## 2017-07-08 VITALS — BP 146/72 | HR 86 | Temp 97.6°F | Ht 63.0 in | Wt 173.0 lb

## 2017-07-08 DIAGNOSIS — Z0000 Encounter for general adult medical examination without abnormal findings: Secondary | ICD-10-CM

## 2017-07-08 DIAGNOSIS — E785 Hyperlipidemia, unspecified: Secondary | ICD-10-CM

## 2017-07-08 DIAGNOSIS — E039 Hypothyroidism, unspecified: Secondary | ICD-10-CM

## 2017-07-08 DIAGNOSIS — I10 Essential (primary) hypertension: Secondary | ICD-10-CM | POA: Diagnosis not present

## 2017-07-08 DIAGNOSIS — R32 Unspecified urinary incontinence: Secondary | ICD-10-CM

## 2017-07-08 DIAGNOSIS — G8929 Other chronic pain: Secondary | ICD-10-CM

## 2017-07-08 DIAGNOSIS — I739 Peripheral vascular disease, unspecified: Secondary | ICD-10-CM

## 2017-07-08 DIAGNOSIS — R0989 Other specified symptoms and signs involving the circulatory and respiratory systems: Secondary | ICD-10-CM

## 2017-07-08 DIAGNOSIS — R1310 Dysphagia, unspecified: Secondary | ICD-10-CM | POA: Diagnosis not present

## 2017-07-08 DIAGNOSIS — M179 Osteoarthritis of knee, unspecified: Secondary | ICD-10-CM | POA: Diagnosis not present

## 2017-07-08 DIAGNOSIS — M25562 Pain in left knee: Secondary | ICD-10-CM | POA: Diagnosis not present

## 2017-07-08 DIAGNOSIS — R42 Dizziness and giddiness: Secondary | ICD-10-CM | POA: Diagnosis not present

## 2017-07-08 MED ORDER — ZOSTER VAC RECOMB ADJUVANTED 50 MCG/0.5ML IM SUSR: 0.5000 mL | Freq: Once | INTRAMUSCULAR | 1 refills | Status: AC

## 2017-07-08 MED ORDER — ZOSTER VAC RECOMB ADJUVANTED 50 MCG/0.5ML IM SUSR: 0.5000 mL | Freq: Once | INTRAMUSCULAR | 1 refills | Status: DC

## 2017-07-08 NOTE — Patient Instructions (Signed)
Tonya Russell , Thank you for taking time to come for your Medicare Wellness Visit. I appreciate your ongoing commitment to your health goals. Please review the following plan we discussed and let me know if I can assist you in the future.   Screening recommendations/referrals: Colonoscopy excluded, you are over age 81 Mammogram excluded, you are over age 62 Bone Density up to date Recommended yearly ophthalmology/optometry visit for glaucoma screening and checkup Recommended yearly dental visit for hygiene and checkup  Vaccinations: Influenza vaccine up to date. Due 2019 fall season Pneumococcal vaccine up to date Tdap vaccine up to date. Due 09/10/2017 Shingles vaccine due, prescription sent out for you  Advanced directives: In Chart  Conditions/risks identified: None  Next appointment: Dr. Mariea Clonts 07/08/2018 @ 11am   Preventive Care 65 Years and Older, Female Preventive care refers to lifestyle choices and visits with your health care provider that can promote health and wellness. What does preventive care include?  A yearly physical exam. This is also called an annual well check.  Dental exams once or twice a year.  Routine eye exams. Ask your health care provider how often you should have your eyes checked.  Personal lifestyle choices, including:  Daily care of your teeth and gums.  Regular physical activity.  Eating a healthy diet.  Avoiding tobacco and drug use.  Limiting alcohol use.  Practicing safe sex.  Taking low-dose aspirin every day.  Taking vitamin and mineral supplements as recommended by your health care provider. What happens during an annual well check? The services and screenings done by your health care provider during your annual well check will depend on your age, overall health, lifestyle risk factors, and family history of disease. Counseling  Your health care provider may ask you questions about your:  Alcohol use.  Tobacco use.  Drug  use.  Emotional well-being.  Home and relationship well-being.  Sexual activity.  Eating habits.  History of falls.  Memory and ability to understand (cognition).  Work and work Statistician.  Reproductive health. Screening  You may have the following tests or measurements:  Height, weight, and BMI.  Blood pressure.  Lipid and cholesterol levels. These may be checked every 5 years, or more frequently if you are over 34 years old.  Skin check.  Lung cancer screening. You may have this screening every year starting at age 54 if you have a 30-pack-year history of smoking and currently smoke or have quit within the past 15 years.  Fecal occult blood test (FOBT) of the stool. You may have this test every year starting at age 20.  Flexible sigmoidoscopy or colonoscopy. You may have a sigmoidoscopy every 5 years or a colonoscopy every 10 years starting at age 8.  Hepatitis C blood test.  Hepatitis B blood test.  Sexually transmitted disease (STD) testing.  Diabetes screening. This is done by checking your blood sugar (glucose) after you have not eaten for a while (fasting). You may have this done every 1-3 years.  Bone density scan. This is done to screen for osteoporosis. You may have this done starting at age 104.  Mammogram. This may be done every 1-2 years. Talk to your health care provider about how often you should have regular mammograms. Talk with your health care provider about your test results, treatment options, and if necessary, the need for more tests. Vaccines  Your health care provider may recommend certain vaccines, such as:  Influenza vaccine. This is recommended every year.  Tetanus, diphtheria,  and acellular pertussis (Tdap, Td) vaccine. You may need a Td booster every 10 years.  Zoster vaccine. You may need this after age 65.  Pneumococcal 13-valent conjugate (PCV13) vaccine. One dose is recommended after age 61.  Pneumococcal polysaccharide  (PPSV23) vaccine. One dose is recommended after age 80. Talk to your health care provider about which screenings and vaccines you need and how often you need them. This information is not intended to replace advice given to you by your health care provider. Make sure you discuss any questions you have with your health care provider. Document Released: 09/23/2015 Document Revised: 05/16/2016 Document Reviewed: 06/28/2015 Elsevier Interactive Patient Education  2017 [place] Prevention in the Home Falls can cause injuries. They can happen to people of all ages. There are many things you can do to make your home safe and to help prevent falls. What can I do on the outside of my home?  Regularly fix the edges of walkways and driveways and fix any cracks.  Remove anything that might make you trip as you walk through a door, such as a raised step or threshold.  Trim any bushes or trees on the path to your home.  Use bright outdoor lighting.  Clear any walking paths of anything that might make someone trip, such as rocks or tools.  Regularly check to see if handrails are loose or broken. Make sure that both sides of any steps have handrails.  Any raised decks and porches should have guardrails on the edges.  Have any leaves, snow, or ice cleared regularly.  Use sand or salt on walking paths during winter.  Clean up any spills in your garage right away. This includes oil or grease spills. What can I do in the bathroom?  Use night lights.  Install grab bars by the toilet and in the tub and shower. Do not use towel bars as grab bars.  Use non-skid mats or decals in the tub or shower.  If you need to sit down in the shower, use a plastic, non-slip stool.  Keep the floor dry. Clean up any water that spills on the floor as soon as it happens.  Remove soap buildup in the tub or shower regularly.  Attach bath mats securely with double-sided non-slip rug tape.  Do not have  throw rugs and other things on the floor that can make you trip. What can I do in the bedroom?  Use night lights.  Make sure that you have a light by your bed that is easy to reach.  Do not use any sheets or blankets that are too big for your bed. They should not hang down onto the floor.  Have a firm chair that has side arms. You can use this for support while you get dressed.  Do not have throw rugs and other things on the floor that can make you trip. What can I do in the kitchen?  Clean up any spills right away.  Avoid walking on wet floors.  Keep items that you use a lot in easy-to-reach places.  If you need to reach something above you, use a strong step stool that has a grab bar.  Keep electrical cords out of the way.  Do not use floor polish or wax that makes floors slippery. If you must use wax, use non-skid floor wax.  Do not have throw rugs and other things on the floor that can make you trip. What can I do with my  stairs?  Do not leave any items on the stairs.  Make sure that there are handrails on both sides of the stairs and use them. Fix handrails that are broken or loose. Make sure that handrails are as long as the stairways.  Check any carpeting to make sure that it is firmly attached to the stairs. Fix any carpet that is loose or worn.  Avoid having throw rugs at the top or bottom of the stairs. If you do have throw rugs, attach them to the floor with carpet tape.  Make sure that you have a light switch at the top of the stairs and the bottom of the stairs. If you do not have them, ask someone to add them for you. What else can I do to help prevent falls?  Wear shoes that:  Do not have high heels.  Have rubber bottoms.  Are comfortable and fit you well.  Are closed at the toe. Do not wear sandals.  If you use a stepladder:  Make sure that it is fully opened. Do not climb a closed stepladder.  Make sure that both sides of the stepladder are  locked into place.  Ask someone to hold it for you, if possible.  Clearly mark and make sure that you can see:  Any grab bars or handrails.  First and last steps.  Where the edge of each step is.  Use tools that help you move around (mobility aids) if they are needed. These include:  Canes.  Walkers.  Scooters.  Crutches.  Turn on the lights when you go into a dark area. Replace any light bulbs as soon as they burn out.  Set up your furniture so you have a clear path. Avoid moving your furniture around.  If any of your floors are uneven, fix them.  If there are any pets around you, be aware of where they are.  Review your medicines with your doctor. Some medicines can make you feel dizzy. This can increase your chance of falling. Ask your doctor what other things that you can do to help prevent falls. This information is not intended to replace advice given to you by your health care provider. Make sure you discuss any questions you have with your health care provider. Document Released: 06/23/2009 Document Revised: 02/02/2016 Document Reviewed: 10/01/2014 Elsevier Interactive Patient Education  2017 [employer].

## 2017-07-08 NOTE — Progress Notes (Signed)
Subjective:   Tonya Russell is a 83 y.o. female who presents for Medicare Annual (Subsequent) preventive examination.  Last AWV-07/04/2016    Objective:     Vitals: BP (!) 146/72 (BP Location: Right Arm, Patient Position: Sitting)   Pulse 86   Temp 97.6 F (36.4 C) (Oral)   Ht 5\' 3"  (1.6 m)   Wt 173 lb (78.5 kg)   SpO2 96%   BMI 30.65 kg/m   Body mass index is 30.65 kg/m.   Tobacco History  Smoking Status  . Never Smoker  Smokeless Tobacco  . Never Used     Counseling given: Not Answered   Past Medical History:  Diagnosis Date  . Abdominal pain, epigastric   . Allergic rhinitis due to other allergen   . Allergic rhinitis, cause unspecified   . Dermatophytosis of nail   . Diffuse cystic mastopathy   . Diverticulosis of colon (without mention of hemorrhage)   . Dysphagia, unspecified(787.20)   . Esophageal reflux   . Family history of malignant neoplasm of gastrointestinal tract   . Hiatal hernia   . Hyperlipidemia   . Hypertension   . Internal hemorrhoids without mention of complication   . 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 Obesity   . Oral aphthae   . Osteoarthrosis, unspecified whether generalized or localized, unspecified site   . Other abnormal blood chemistry   . Other voice and resonance disorders   . Pain in joint, pelvic region and thigh   . Palpitations   . Pancreatitis   . Peripheral vascular disease, unspecified ([place])   . Schatzki's ring   . Senile osteoporosis   . Torus fracture of fibula   . Unspecified constipation   . Unspecified hypothyroidism    Past Surgical History:  Procedure Laterality Date  . APPENDECTOMY    . BASAL CELL CARCINOMA EXCISION     right humerus, right temple, left side of nose  . BASAL CELL CARCINOMA EXCISION     chest, left cheek  . CATARACT EXTRACTION, BILATERAL    . CHOLECYSTECTOMY    . keratosis     left arm  . resection of ampullary tumor    . squamous cell cancer     Right leg  .  squamous cell cancer left triceps Left    Dr. Lorenza Cambridge  . TONSILLECTOMY AND ADENOIDECTOMY     Family History  Problem Relation Age of Onset  . Cancer Mother        spindle cell carcinoma on neck  . Colon cancer Father 78  . Breast cancer Sister   . Colon polyps Sister   . Heart disease Brother   . Breast cancer Unknown        neiece x 3  . Esophageal cancer Neg Hx   . Pancreatic cancer Neg Hx   . Kidney disease Neg Hx   . Liver disease Neg Hx    History  Sexual Activity  . Sexual activity: No    Outpatient Encounter Prescriptions as of 07/08/2017  Medication Sig  . alendronate (FOSAMAX) 70 MG tablet Take 1 tablet (70 mg total) by mouth every 7 (seven) days. Once a Week. Take with a full glass of water on an empty stomach.  Marland Kitchen aspirin 81 MG tablet Take one tablet once daily to prevent stroke  . bimatoprost (LUMIGAN) 0.01 % SOLN Place 1 drop into both eyes at bedtime.  . DULoxetine (CYMBALTA) 30 MG capsule Take 1 capsule (30 mg  total) by mouth daily.  . fluticasone (FLONASE) 50 MCG/ACT nasal spray Place 1 spray into both nostrils at bedtime.  . gabapentin (NEURONTIN) 300 MG capsule Take 300 mg by mouth at bedtime.  Marland Kitchen ibuprofen (ADVIL,MOTRIN) 200 MG tablet Take 200 mg by mouth every 6 (six) hours as needed for mild pain.  . Inulin (FIBER CHOICE PO) Take 5 g by mouth 2 (two) times daily.  Marland Kitchen levothyroxine (SYNTHROID, LEVOTHROID) 50 MCG tablet TAKE 1 TABLET DAILY FOR THYROID SUPPLEMENT  . loratadine (CLARITIN) 10 MG tablet Take 10 mg by mouth daily.  . Multiple Vitamin (MULTIVITAMIN WITH MINERALS) TABS tablet Take 1 tablet by mouth daily.  . Omega-3 Fatty Acids (FISH OIL) 1200 MG CAPS Take 1,200 mg by mouth 3 (three) times daily.   Marland Kitchen omeprazole (PRILOSEC) 20 MG capsule TAKE 1 CAPSULE DAILY TO REDUCE STOMACH ACID.  Marland Kitchen Polyethyl Glycol-Propyl Glycol (SYSTANE ULTRA OP) Apply 1 drop to eye as needed. Both eyes for dry eyes  . UNABLE TO FIND Bone Care: Calcium, magnesium, vitamins D3 and  K: Take one tablet by mouth every evening.  Marland Kitchen Zoster Vaccine Adjuvanted [facility]) injection Inject 0.5 mLs into the muscle once.  . [DISCONTINUED] Zoster Vaccine Adjuvanted [facility]) injection Inject 0.5 mLs into the muscle once.  . [DISCONTINUED] Zoster Vaccine Adjuvanted [facility]) injection Inject 0.5 mLs into the muscle once.  . [DISCONTINUED] azithromycin (ZITHROMAX Z-PAK) 250 MG tablet 2 tabs today, then one daily for 4 days   No facility-administered encounter medications on file as of 07/08/2017.     Activities of Daily Living In your present state of health, do you have any difficulty performing the following activities: 07/08/2017  Hearing? N  Vision? Y  Difficulty concentrating or making decisions? N  Walking or climbing stairs? Y  Dressing or bathing? Y  Doing errands, shopping? Y  Preparing Food and eating ? N  Using the Toilet? N  In the past six months, have you accidently leaked urine? Y  Do you have problems with loss of bowel control? N  Managing your Medications? N  Managing your Finances? N  Housekeeping or managing your Housekeeping? N  Some recent data might be hidden    Patient Care Team: Gayland Curry, DO as PCP - General (Geriatric Medicine) [name], MD as Consulting Physician (Gastroenterology) Lelon Perla, MD as Consulting Physician (Cardiology) Druscilla Brownie, MD as Consulting Physician (Dermatology) [name], MD as Consulting Physician (Ophthalmology)    Assessment:      Exercise Activities and Dietary recommendations Current Exercise Habits: The patient does not participate in regular exercise at present, Exercise limited by: orthopedic condition(s)  Goals    . Exercise More          Starting 07/04/16, I will attempt to exercise at least twice a week, walking or using my pedaling machine.     . Maintain Lifestyle          Starting today pt will maintain lifestyle.       Fall Risk Fall Risk  07/08/2017  04/10/2017 03/27/2017 02/26/2017 07/04/2016  Falls in the past year? Yes Yes Yes Yes No  Number falls in past yr: 1 2 or more 2 or more 1 -  Injury with Fall? No - Yes Yes -  Comment - - - bruised back and elbow -   Depression Screen PHQ 2/9 Scores 07/08/2017 07/04/2016 06/17/2013 12/17/2012  PHQ - 2 Score 0 0 0 0     Cognitive Function MMSE - Mini  Mental State Exam 03/27/2017 03/27/2017 07/04/2016  Orientation to time 5 5 5   Orientation to Place 5 5 5   Registration 3 3 3   Attention/ Calculation 5 5 5   Recall 3 3 3   Language- name 2 objects 2 2 2   Language- repeat 1 1 1   Language- follow 3 step command 3 3 3   Language- read & follow direction 1 1 1   Write a sentence 1 1 1   Copy design 1 1 1   Total score 30 30 30         Immunization History  Administered Date(s) Administered  . Influenza, High Dose Seasonal PF 05/09/2017  . Influenza,inj,Quad PF,6+ Mos 06/17/2013, 06/23/2014, 09/07/2015, 07/04/2016  . Influenza-Unspecified 07/09/2012  . Pneumococcal Polysaccharide-23 07/04/2016  . Pneumococcal-Unspecified 09/10/1992  . Tdap 09/11/2007  . Zoster 11/28/2010   Screening Tests Health Maintenance  Topic Date Due  . PNA vac Low Risk Adult (2 of 2 - PCV13) 07/04/2017  . TETANUS/TDAP  09/10/2017  . MAMMOGRAM  01/29/2018  . INFLUENZA VACCINE  Completed  . DEXA SCAN  Completed      Plan:  I have personally reviewed and addressed the Medicare Annual Wellness questionnaire and have noted the following in the patient's chart:  A. Medical and social history B. Use of alcohol, tobacco or illicit drugs  C. Current medications and supplements D. Functional ability and status E.  Nutritional status F.  Physical activity G. Advance directives H. List of other physicians I.  Hospitalizations, surgeries, and ER visits in previous 12 months J.  [place] to include hearing, vision, cognitive, depression L. Referrals and appointments - none  In addition, I have reviewed and  discussed with patient certain preventive protocols, quality metrics, and best practice recommendations. A written personalized care plan for preventive services as well as general preventive health recommendations were provided to patient.  See attached scanned questionnaire for additional information.   Signed,   Rich Reining, RN Nurse Health Advisor   Quick Notes   Health Maintenance: Shingles vaccine prescription given to patient     Abnormal Screen: MMSE done 03/27/2017 30/30     Patient Concerns: Incontinence for last week-possibly due to new addition of gabapentin. 3 days of bloody scabs in nose.     Nurse Concerns: None

## 2017-07-08 NOTE — Progress Notes (Signed)
Location:     [place] [facility] of Service:   Clinic  Provider: Thad Osoria L. Mariea Clonts, D.O., C.M.D.  Code Status: DNR Goals of Care:  Advanced Directives 07/08/2017  Does Patient Have a Medical Advance Directive? Yes  Type of Advance Directive Living will;Out of facility DNR (pink MOST or yellow form)  Does patient want to make changes to medical advance directive? No - Patient declined  Copy of [place] in Chart? -  Pre-existing out of facility DNR order (yellow form or pink MOST form) Yellow form placed in chart (order not valid for inpatient use);Pink MOST form placed in chart (order not valid for inpatient use)     Chief Complaint  Patient presents with  . Medical Management of Chronic Issues    61mth follow-up, discuss labs    HPI: Patient is a 83 y.o. female seen today for medical management of chronic diseases.    Osteoarthritis- Had a steroid injection in her back in September but says it "didn't take". Was put on gabapentin two weeks ago by Orthopedics. Says her right leg has been doing better since starting gabapentin but her left leg still hurts especially her knee. She has not had any imaging done of her knee ever and thinks she probably has arthritis in there.    Hypertension- was on metoprolol but was having dizzy spells, still is having light headedness. BP today 146/72. No on any antihypertensives now.    Osteoporosis-On Fosamax 70mg .  No calcium or vitamin D are on her list.  Need to discuss next time.  Hyperlipidemia- Cholesterol 265, HDL 93, Triglycerides 75, LDL 154. Was on pravachol but stopped due to myalgias.  Walks to the mail box every day but not much exercise otherwise. Takes Omega 3  PVD- Ankles swell some, Says her left ankle is worse. She says her feet turn "firey red" at night. Currently on asprin  Epistaxis/Sinus issues- Has been having some blood in her tissues when she blows her nose. Sometimes she says she blows scabs but has not had a  "running bloody nose". States she humidifies the air by putting puts of water on top of her furnace. On Flonase and claritin   Urinary incontinence- Says that she used to have some stress incontinence but now is having trouble making it to the bathroom all the time. Thinks maybe its worse since starting gabapentin. Says she has always had to get up to urinate during the night but could make it before.   Hypothyroidism- Last TSH 1.24, not having any issues at this time. On levothyroxine 28mcg and does take as directed.    Past Medical History:  Diagnosis Date  . Abdominal pain, epigastric   . Allergic rhinitis due to other allergen   . Allergic rhinitis, cause unspecified   . Dermatophytosis of nail   . Diffuse cystic mastopathy   . Diverticulosis of colon (without mention of hemorrhage)   . Dysphagia, unspecified(787.20)   . Esophageal reflux   . Family history of malignant neoplasm of gastrointestinal tract   . Hiatal hernia   . Hyperlipidemia   . Hypertension   . Internal hemorrhoids without mention of complication   . 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 Obesity   . Oral aphthae   . Osteoarthrosis, unspecified whether generalized or localized, unspecified site   . Other abnormal blood chemistry   . Other voice and resonance disorders   . Pain in joint, pelvic region and thigh   .  Palpitations   . Pancreatitis   . Peripheral vascular disease, unspecified ([place])   . Schatzki's ring   . Senile osteoporosis   . Torus fracture of fibula   . Unspecified constipation   . Unspecified hypothyroidism     Past Surgical History:  Procedure Laterality Date  . APPENDECTOMY    . BASAL CELL CARCINOMA EXCISION     right humerus, right temple, left side of nose  . BASAL CELL CARCINOMA EXCISION     chest, left cheek  . CATARACT EXTRACTION, BILATERAL    . CHOLECYSTECTOMY    . keratosis     left arm  . resection of ampullary tumor    . squamous cell cancer     Right leg    . squamous cell cancer left triceps Left    Dr. Lorenza Cambridge  . TONSILLECTOMY AND ADENOIDECTOMY      Allergies  Allergen Reactions  . Codeine Nausea And Vomiting  . Lodine [Etodolac] Nausea And Vomiting  . Penicillins     unknown    Outpatient Encounter Prescriptions as of 07/08/2017  Medication Sig  . alendronate (FOSAMAX) 70 MG tablet Take 1 tablet (70 mg total) by mouth every 7 (seven) days. Once a Week. Take with a full glass of water on an empty stomach.  Marland Kitchen aspirin 81 MG tablet Take one tablet once daily to prevent stroke  . bimatoprost (LUMIGAN) 0.01 % SOLN Place 1 drop into both eyes at bedtime.  . DULoxetine (CYMBALTA) 30 MG capsule Take 1 capsule (30 mg total) by mouth daily.  . fluticasone (FLONASE) 50 MCG/ACT nasal spray Place 1 spray into both nostrils at bedtime.  . gabapentin (NEURONTIN) 300 MG capsule Take 300 mg by mouth at bedtime.  Marland Kitchen ibuprofen (ADVIL,MOTRIN) 200 MG tablet Take 200 mg by mouth every 6 (six) hours as needed for mild pain.  . Inulin (FIBER CHOICE PO) Take 5 g by mouth 2 (two) times daily.  Marland Kitchen levothyroxine (SYNTHROID, LEVOTHROID) 50 MCG tablet TAKE 1 TABLET DAILY FOR THYROID SUPPLEMENT  . loratadine (CLARITIN) 10 MG tablet Take 10 mg by mouth daily.  . Multiple Vitamin (MULTIVITAMIN WITH MINERALS) TABS tablet Take 1 tablet by mouth daily.  . Omega-3 Fatty Acids (FISH OIL) 1200 MG CAPS Take 1,200 mg by mouth 3 (three) times daily.   Marland Kitchen omeprazole (PRILOSEC) 20 MG capsule TAKE 1 CAPSULE DAILY TO REDUCE STOMACH ACID.  Marland Kitchen Polyethyl Glycol-Propyl Glycol (SYSTANE ULTRA OP) Apply 1 drop to eye as needed. Both eyes for dry eyes  . UNABLE TO FIND Bone Care: Calcium, magnesium, vitamins D3 and K: Take one tablet by mouth every evening.  Marland Kitchen Zoster Vaccine Adjuvanted [facility]) injection Inject 0.5 mLs into the muscle once.   No facility-administered encounter medications on file as of 07/08/2017.     Review of Systems:  Review of Systems  Constitutional: Negative  for chills, malaise/fatigue and weight loss.  Eyes: Positive for blurred vision.       Glasses  Respiratory: Negative for cough, sputum production, shortness of breath and wheezing.   Cardiovascular: Positive for claudication and leg swelling. Negative for chest pain.  Gastrointestinal: Negative for abdominal pain, constipation and diarrhea.  Genitourinary: Positive for frequency and urgency.       Incontinence   Musculoskeletal: Positive for back pain and joint pain.       Using a cane  Neurological: Positive for dizziness. Negative for focal weakness, loss of consciousness and weakness.  Psychiatric/Behavioral: Positive for depression. The patient has  insomnia.     Health Maintenance  Topic Date Due  . PNA vac Low Risk Adult (2 of 2 - PCV13) 07/04/2017  . TETANUS/TDAP  09/10/2017  . MAMMOGRAM  01/29/2018  . INFLUENZA VACCINE  Completed  . DEXA SCAN  Completed    Physical Exam: Vitals:   07/08/17 1101  BP: (!) 146/72  Pulse: 86  Temp: 97.6 F (36.4 C)  TempSrc: Oral  SpO2: 96%  Weight: 173 lb (78.5 kg)  Height: 5\' 3"  (1.6 m)   Body mass index is 30.65 kg/m. Physical Exam  Constitutional: She is oriented to person, place, and time. She appears well-developed and well-nourished.  HENT:  Head: Normocephalic and atraumatic.  Eyes:  Glasses  Cardiovascular: Normal rate, regular rhythm, normal heart sounds and intact distal pulses.   Pulses:      Dorsalis pedis pulses are 1+ on the right side.       Posterior tibial pulses are 1+ on the right side.  1+ pitting edema in left ankle  Pulmonary/Chest: Effort normal and breath sounds normal. She has no wheezes.  Abdominal: Soft. Bowel sounds are normal.  Musculoskeletal: She exhibits edema and tenderness.  Posteromedial popliteal space with area of exquisite tenderness that causes her to yell out  Neurological: She is alert and oriented to person, place, and time.  Skin: Skin is warm and dry. Capillary refill takes less  than 2 seconds.  Psychiatric: She has a normal mood and affect. Her behavior is normal. Judgment and thought content normal.  Has multiple somatic complaints--seems to have more while her sister is here where she seemed more content last visit    Labs reviewed: Basic Metabolic Panel:  Recent Labs  12/31/16 0917 02/26/17 1230 07/01/17 1035  NA 141 142 138  K 4.1 4.0 3.9  CL 105 106 102  CO2 24 26 32  GLUCOSE 106* 110* 107*  BUN 15 15 12   CREATININE 0.67 0.71 0.66  CALCIUM 9.6 9.4 9.0  TSH 1.97 1.24  --    Liver Function Tests:  Recent Labs  12/31/16 0917 02/26/17 1230 07/01/17 1035  AST 19 21 18   ALT 15 14 15   ALKPHOS 43 42  --   BILITOT 0.7 0.7 0.6  PROT 7.0 6.8 6.4  ALBUMIN 4.2 4.2  --    No results for input(s): LIPASE, AMYLASE in the last 8760 hours. No results for input(s): AMMONIA in the last 8760 hours. CBC:  Recent Labs  02/26/17 1230  WBC 6.1  NEUTROABS 3,538  HGB 12.7  HCT 38.1  MCV 94.3  PLT 153   Lipid Panel:  Recent Labs  12/31/16 0917 07/01/17 1035  CHOL 265* 265*  HDL 87 93  LDLCALC 163*  --   TRIG 74 75  CHOLHDL 3.0 2.8   Lab Results  Component Value Date   HGBA1C 5.3 07/01/2017    Procedures since last visit: No results found.  Assessment/Plan 1. Hypothyroidism, unspecified type Continue current Levothyroxine dose, tsh at goal  2. Chronic pain of left knee Continue gabapentin as ordered by orthopedics, but obtain xrays of knee to evaluate for any OA or baker's cyst--has some in family by report  3. Essential hypertension Not on any medication at this time, Bp slightly elevated, but gets dizzy even now so adding something will only worsen that and increase fall risk in this 83 yo  4. Peripheral vascular disease, unspecified (HCC) Aspirin daily and elevate which improves comfort--could be cause of her leg pains overall--it's hard  to get her to describe her symptoms--descriptions change over the course of the visit  5.  Dysphagia, unspecified type Continue current management, refuses MBS or speech therapy  6. Hyperlipidemia, unspecified hyperlipidemia type Not on any medication, manage with diet with conservative goals due to age of 46 and already present leg pains  7. Incontinence of urine in female -worse now with more urgency component--did not want more medications, encouraged scheduled bathroom trips to avoid accidents  8. Dizzy Continue with Flonase to help with pressure. Following up with eye doctor. Some component of orthostasis also may be present so avoiding adding any bp meds  9. Sinus complaint Continue Flonase but also use saline drops for sinus dryness, warm humidity, and counseled on proper way to spray the flonase to avoid nose bleeds    Labs/tests ordered:   Orders Placed This Encounter  Procedures  . DG Knee Complete 4 Views Left    Standing Status:   Future    Number of Occurrences:   1    Standing Expiration Date:   09/08/2018    Order Specific Question:   Reason for Exam (SYMPTOM  OR DIAGNOSIS REQUIRED)    Answer:   left posteromedial knee pain, swelling    Order Specific Question:   Preferred imaging location?    Answer:   [facility]    Order Specific Question:   Call Results- Best Contact Number?    Answer:   [phone]    Next appt: 11/11/2017 [facility] [place]. Kaliopi Blyden, D.O. [employer] [address], [place] [address] Cell Phone (Mon-Fri 8am-5pm):  [phone] On Call:  [phone] & follow prompts after 5pm & weekends Office Phone:  [phone] Office Fax:  [phone]

## 2017-07-13 DIAGNOSIS — R32 Unspecified urinary incontinence: Secondary | ICD-10-CM | POA: Insufficient documentation

## 2017-07-23 ENCOUNTER — Ambulatory Visit (INDEPENDENT_AMBULATORY_CARE_PROVIDER_SITE_OTHER): Payer: Medicare Other | Admitting: Nurse Practitioner

## 2017-07-23 ENCOUNTER — Encounter: Payer: Self-pay | Admitting: Nurse Practitioner

## 2017-07-23 VITALS — BP 144/82 | HR 73 | Temp 97.9°F | Resp 17 | Ht 63.0 in | Wt 176.0 lb

## 2017-07-23 DIAGNOSIS — M79605 Pain in left leg: Secondary | ICD-10-CM

## 2017-07-23 DIAGNOSIS — R32 Unspecified urinary incontinence: Secondary | ICD-10-CM

## 2017-07-23 DIAGNOSIS — G8929 Other chronic pain: Secondary | ICD-10-CM | POA: Diagnosis not present

## 2017-07-23 DIAGNOSIS — M25562 Pain in left knee: Secondary | ICD-10-CM | POA: Diagnosis not present

## 2017-07-23 DIAGNOSIS — M810 Age-related osteoporosis without current pathological fracture: Secondary | ICD-10-CM

## 2017-07-23 MED ORDER — UNABLE TO FIND: 1.0000 | Freq: Every day | Status: DC

## 2017-07-23 NOTE — Patient Instructions (Addendum)
To take calcium 600 mg by mouth twice daily with Vit D 2000 units daily OR bone health vitamin- you do not need to take both at the same time this is duplicate therapy  Will place order for home health to help with knee and leg pain Can use aspercream with lidocaine cream to help with sides of knees and knee pain.   Will also get ultrasound of left leg due to ongoing pain

## 2017-07-23 NOTE — Progress Notes (Signed)
Careteam: Patient Care Team: Gayland Curry, DO as PCP - General (Geriatric Medicine) [name], MD as Consulting Physician (Gastroenterology) Lelon Perla, MD as Consulting Physician (Cardiology) Druscilla Brownie, MD as Consulting Physician (Dermatology) [name], MD as Consulting Physician (Ophthalmology)  Advanced Directive information Does Patient Have a Medical Advance Directive?: Yes, Type of Advance Directive: Living will;Out of facility DNR (pink MOST or yellow form), Does patient want to make changes to medical advance directive?: No - Patient declined  Allergies  Allergen Reactions  . Codeine Nausea And Vomiting  . Lodine [Etodolac] Nausea And Vomiting  . Penicillins     unknown    Chief Complaint  Patient presents with  . Follow-up    Pt is being seen for a 2 week follow up on left knee and incontinence.      HPI: Patient is a 83 y.o. female seen in the office today to follow up on left knee pain and incontinence. When she left here after her last visit lost her gabapentin. Did not get it sent to her until 3 days ago. She does not notice a difference since she has restarted the gabapentin.  Pt went for xray of knee which showed mild left knee OA, Dr Mariea Clonts recommended ultrasound but at the time did not wish to get this because knee pain had improved. Now reports it is not doing good.  Reports both leg hurt from buttocks down. Seeing [facility] due to this. Had steroid injection in her back in September but was not effective.  Was placed on gabapentin with pain.  Reports the most beneficial was ibuprofen which she took when she was off gabapentin.  Does not take this often, and only uses 1 ibuprofen  200 mg tablet when needed.  Also uses "patches" OTC.  Reports its burning pain.  First she states it starts mid leg then states it goes up her leg.   Osteoporosis-On Fosamax 70mg .  No calcium or vitamin D are on her list.  Need to discuss  next time.  Urinary incontinence thought to be worsening due to gabapentin did not wish to take medication during last OV and was advised to do scheduled bathroom trips. She lost her gabapentin at last OV but does not feel like urinary symptoms improved however did notice a change when she went from taking it 3 times a day to once daily.  OP- pt taking fosamax along with calcium 600 mg and Vit D 2000 units AND another bone health tablet with 1000 mg of calcium and 2000 units of Vit D daily   Review of Systems:  Review of Systems  Constitutional: Negative for chills, malaise/fatigue and weight loss.  Eyes: Positive for blurred vision.       Glasses  Respiratory: Negative for cough, sputum production, shortness of breath and wheezing.   Cardiovascular: Positive for leg swelling. Negative for chest pain.  Gastrointestinal: Negative for abdominal pain, constipation and diarrhea.  Genitourinary: Positive for frequency and urgency.       Incontinence   Musculoskeletal: Positive for back pain, joint pain and myalgias.       Using a cane  Neurological: Negative for focal weakness, loss of consciousness and weakness.    Past Medical History:  Diagnosis Date  . Abdominal pain, epigastric   . Allergic rhinitis due to other allergen   . Allergic rhinitis, cause unspecified   . Dermatophytosis of nail   . Diffuse cystic mastopathy   . Diverticulosis  of colon (without mention of hemorrhage)   . Dysphagia, unspecified(787.20)   . Esophageal reflux   . Family history of malignant neoplasm of gastrointestinal tract   . Hiatal hernia   . Hyperlipidemia   . Hypertension   . Internal hemorrhoids without mention of complication   . 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 Obesity   . Oral aphthae   . Osteoarthrosis, unspecified whether generalized or localized, unspecified site   . Other abnormal blood chemistry   . Other voice and resonance disorders   . Pain in joint, pelvic region and  thigh   . Palpitations   . Pancreatitis   . Peripheral vascular disease, unspecified ([place])   . Schatzki's ring   . Senile osteoporosis   . Torus fracture of fibula   . Unspecified constipation   . Unspecified hypothyroidism    Past Surgical History:  Procedure Laterality Date  . APPENDECTOMY    . BASAL CELL CARCINOMA EXCISION     right humerus, right temple, left side of nose  . BASAL CELL CARCINOMA EXCISION     chest, left cheek  . CATARACT EXTRACTION, BILATERAL    . CHOLECYSTECTOMY    . keratosis     left arm  . resection of ampullary tumor    . squamous cell cancer     Right leg  . squamous cell cancer left triceps Left    Dr. Lorenza Cambridge  . TONSILLECTOMY AND ADENOIDECTOMY     Social History:   reports that  has never smoked. she has never used smokeless tobacco. She reports that she does not drink alcohol or use drugs.  Family History  Problem Relation Age of Onset  . Cancer Mother        spindle cell carcinoma on neck  . Colon cancer Father 28  . Breast cancer Sister   . Colon polyps Sister   . Heart disease Brother   . Breast cancer Unknown        neiece x 3  . Esophageal cancer Neg Hx   . Pancreatic cancer Neg Hx   . Kidney disease Neg Hx   . Liver disease Neg Hx     Medications:   Medication List        Accurate as of 07/23/17 10:10 AM. Always use your most recent med list.          alendronate 70 MG tablet Commonly known as:  FOSAMAX Take 1 tablet (70 mg total) by mouth every 7 (seven) days. Once a Week. Take with a full glass of water on an empty stomach.   aspirin 81 MG tablet   bimatoprost 0.01 % Soln Commonly known as:  LUMIGAN   DULoxetine 30 MG capsule Commonly known as:  CYMBALTA Take 1 capsule (30 mg total) by mouth daily.   FIBER CHOICE PO   Fish Oil 1200 MG Caps   fluticasone 50 MCG/ACT nasal spray Commonly known as:  FLONASE   gabapentin 300 MG capsule Commonly known as:  NEURONTIN   ibuprofen 200 MG tablet Commonly  known as:  ADVIL,MOTRIN   levothyroxine 50 MCG tablet Commonly known as:  SYNTHROID, LEVOTHROID TAKE 1 TABLET DAILY FOR THYROID SUPPLEMENT   loratadine 10 MG tablet Commonly known as:  CLARITIN   multivitamin with minerals Tabs tablet   omeprazole 20 MG capsule Commonly known as:  PRILOSEC TAKE 1 CAPSULE DAILY TO REDUCE STOMACH ACID.   SYSTANE ULTRA OP   UNABLE TO FIND  Physical Exam:  There were no vitals filed for this visit. There is no height or weight on file to calculate BMI.  Physical Exam  Constitutional: She is oriented to person, place, and time. She appears well-developed and well-nourished.  HENT:  Head: Normocephalic and atraumatic.  Eyes:  Glasses  Cardiovascular: Normal rate, regular rhythm, normal heart sounds and intact distal pulses.  Pulmonary/Chest: Effort normal and breath sounds normal. She has no wheezes.  Musculoskeletal: She exhibits edema and tenderness.  "touchy" to left calf and sides of knee on left. Denies tenderness to right leg/calf  Neurological: She is alert and oriented to person, place, and time. Gait (slow and unsteady, uses cane) abnormal.  Skin: Skin is warm and dry. Capillary refill takes less than 2 seconds.  Psychiatric: She has a normal mood and affect.    Labs reviewed: Basic Metabolic Panel: Recent Labs    12/31/16 0917 02/26/17 1230 07/01/17 1035  NA 141 142 138  K 4.1 4.0 3.9  CL 105 106 102  CO2 24 26 32  GLUCOSE 106* 110* 107*  BUN 15 15 12   CREATININE 0.67 0.71 0.66  CALCIUM 9.6 9.4 9.0  TSH 1.97 1.24  --    Liver Function Tests: Recent Labs    12/31/16 0917 02/26/17 1230 07/01/17 1035  AST 19 21 18   ALT 15 14 15   ALKPHOS 43 42  --   BILITOT 0.7 0.7 0.6  PROT 7.0 6.8 6.4  ALBUMIN 4.2 4.2  --    No results for input(s): LIPASE, AMYLASE in the last 8760 hours. No results for input(s): AMMONIA in the last 8760 hours. CBC: Recent Labs    02/26/17 1230  WBC 6.1  NEUTROABS 3,538  HGB  12.7  HCT 38.1  MCV 94.3  PLT 153   Lipid Panel: Recent Labs    12/31/16 0917 07/01/17 1035  CHOL 265* 265*  HDL 87 93  LDLCALC 163*  --   TRIG 74 75  CHOLHDL 3.0 2.8   TSH: Recent Labs    12/31/16 0917 02/26/17 1230  TSH 1.97 1.24   A1C: Lab Results  Component Value Date   HGBA1C 5.3 07/01/2017     Assessment/Plan 1. Chronic pain of left knee -mild OA on xray. To use aspercream with lidocaine cream to knee OTC as needed - Ambulatory referral to [facility] due to gait and strength training.  - VAS [place] LOWER EXTREMITY VENOUS (DVT); Future due to calf pain and pain in knee.   2. Incontinence of urine in female -unchanged being off gabapentin. encouraged to continue scheduled bathroom trips. Again does not wish to start medication to help with frequency.   3. Left leg pain -ongoing, following with orthopedic, not getting benefit from gabapentin but would like to discuss this with ortho before change.  - Ambulatory referral to Home Health - VAS [place] LOWER EXTREMITY VENOUS (DVT); Future  4. Osteoporosis, unspecified osteoporosis type, unspecified pathological fracture presence -taking duplicate therapy with calcium and vit D, went into detail with pt and person who drove her today to clarify. To cont fosamax weekly.  - UNABLE TO FIND; Take 1 tablet daily by mouth. Bone Care: Calcium 1000mg , magnesium, vitamins D3 2000 units and K: Take one tablet by mouth every evening.  Next appt: as scheduled, sooner if needed Jessilynn Taft K. Harle Battiest  [facility] [phone] 8 am - 5 pm) [phone] (after hours)

## 2017-07-25 ENCOUNTER — Ambulatory Visit (HOSPITAL_COMMUNITY)
Admission: RE | Admit: 2017-07-25 | Discharge: 2017-07-25 | Disposition: A | Discharge status: Home / Self Care | Payer: Medicare Other | Source: Ambulatory Visit | Attending: Vascular Surgery | Admitting: Vascular Surgery

## 2017-07-25 ENCOUNTER — Telehealth: Payer: Self-pay

## 2017-07-25 DIAGNOSIS — G8929 Other chronic pain: Secondary | ICD-10-CM

## 2017-07-25 DIAGNOSIS — M25562 Pain in left knee: Secondary | ICD-10-CM

## 2017-07-25 DIAGNOSIS — I82442 Acute embolism and thrombosis of left tibial vein: Secondary | ICD-10-CM | POA: Insufficient documentation

## 2017-07-25 DIAGNOSIS — M79605 Pain in left leg: Secondary | ICD-10-CM | POA: Diagnosis not present

## 2017-07-25 NOTE — Telephone Encounter (Signed)
Tonya Russell with [facility] called with a preliminary report. Tonya Russell stated that patient is positive for thrombi in left posterior vein and that pt has a large popliteal cyst.   I called [name] with result and she stated to schedule patient for appointment tomorrow to review results.   Still awaiting finial result of study.

## 2017-07-26 ENCOUNTER — Encounter: Payer: Self-pay | Admitting: Nurse Practitioner

## 2017-07-26 ENCOUNTER — Ambulatory Visit (INDEPENDENT_AMBULATORY_CARE_PROVIDER_SITE_OTHER): Payer: Medicare Other | Admitting: Nurse Practitioner

## 2017-07-26 VITALS — BP 136/82 | HR 69 | Temp 97.9°F | Resp 17 | Ht 63.0 in | Wt 176.0 lb

## 2017-07-26 DIAGNOSIS — M25562 Pain in left knee: Secondary | ICD-10-CM | POA: Diagnosis not present

## 2017-07-26 DIAGNOSIS — I82442 Acute embolism and thrombosis of left tibial vein: Secondary | ICD-10-CM | POA: Diagnosis not present

## 2017-07-26 DIAGNOSIS — R32 Unspecified urinary incontinence: Secondary | ICD-10-CM | POA: Diagnosis not present

## 2017-07-26 DIAGNOSIS — M7122 Synovial cyst of popliteal space [Baker], left knee: Secondary | ICD-10-CM

## 2017-07-26 DIAGNOSIS — G8929 Other chronic pain: Secondary | ICD-10-CM | POA: Diagnosis not present

## 2017-07-26 DIAGNOSIS — M810 Age-related osteoporosis without current pathological fracture: Secondary | ICD-10-CM | POA: Diagnosis not present

## 2017-07-26 MED ORDER — APIXABAN 5 MG PO TABS: 5.0000 mg | ORAL_TABLET | Freq: Two times a day (BID) | ORAL | 5 refills | Status: DC

## 2017-07-26 NOTE — Patient Instructions (Addendum)
To start Eliquis 10 mg (2 of the 5 mg tablets) by mouth twice daily for 7 days then to taking Eliquis 5 mg by mouth twice daily for blood clot  STOP ibuprofen. Need to avoid NSAIDS (Aleve, Advil, Motrin, Ibuprofen)  Can use tylenol 500 mg 1-2 tablets every 8 hours as needed for pain  To stop ASA because you are now on eliquis for blood thinner

## 2017-07-26 NOTE — Progress Notes (Signed)
Careteam: Patient Care Team: Gayland Curry, DO as PCP - General (Geriatric Medicine) [name], MD as Consulting Physician (Gastroenterology) Lelon Perla, MD as Consulting Physician (Cardiology) Druscilla Brownie, MD as Consulting Physician (Dermatology) [name], MD as Consulting Physician (Ophthalmology)  Advanced Directive information Does Patient Have a Medical Advance Directive?: Yes, Type of Advance Directive: Living will;Out of facility DNR (pink MOST or yellow form), Does patient want to make changes to medical advance directive?: No - Patient declined  Allergies  Allergen Reactions  . Codeine Nausea And Vomiting  . Lodine [Etodolac] Nausea And Vomiting  . Penicillins     unknown    Chief Complaint  Patient presents with  . Acute Visit    Pt is being seen to discuss vascular ultrasound results.   . Other    Sister in room.     HPI: Patient is a 83 y.o. female seen in the office today due to abnormal left leg [place]. Pt with ongoing pain to left lower and upper leg. US revealed thrombus visualized in left posterior tibial vein and popliteal cyst.  There has been no increase in LE edema noted.  Pt denies shortness of breath or chest pains.  Discussed results of venous doppler and treatment options with pt and sister who is at visit today.  Reports today she feels like the gabapentin is helping her pain in her right pain  Review of Systems:  Review of Systems  Constitutional: Negative for chills, malaise/fatigue and weight loss.  Eyes: Positive for blurred vision.       Glasses  Respiratory: Negative for cough, sputum production, shortness of breath and wheezing.   Cardiovascular: Positive for leg swelling. Negative for chest pain.  Gastrointestinal: Negative for abdominal pain, constipation and diarrhea.  Genitourinary: Positive for frequency and urgency.       Incontinence   Musculoskeletal: Positive for back pain, joint pain and myalgias.     Using a cane  Neurological: Negative for focal weakness, loss of consciousness and weakness.    Past Medical History:  Diagnosis Date  . Abdominal pain, epigastric   . Allergic rhinitis due to other allergen   . Allergic rhinitis, cause unspecified   . Dermatophytosis of nail   . Diffuse cystic mastopathy   . Diverticulosis of colon (without mention of hemorrhage)   . Dysphagia, unspecified(787.20)   . Esophageal reflux   . Family history of malignant neoplasm of gastrointestinal tract   . Hiatal hernia   . Hyperlipidemia   . Hypertension   . Internal hemorrhoids without mention of complication   . 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 Obesity   . Oral aphthae   . Osteoarthrosis, unspecified whether generalized or localized, unspecified site   . Other abnormal blood chemistry   . Other voice and resonance disorders   . Pain in joint, pelvic region and thigh   . Palpitations   . Pancreatitis   . Peripheral vascular disease, unspecified ([place])   . Schatzki's ring   . Senile osteoporosis   . Torus fracture of fibula   . Unspecified constipation   . Unspecified hypothyroidism    Past Surgical History:  Procedure Laterality Date  . APPENDECTOMY    . BASAL CELL CARCINOMA EXCISION     right humerus, right temple, left side of nose  . BASAL CELL CARCINOMA EXCISION     chest, left cheek  . CATARACT EXTRACTION, BILATERAL    . CHOLECYSTECTOMY    .  keratosis     left arm  . resection of ampullary tumor    . squamous cell cancer     Right leg  . squamous cell cancer left triceps Left    Dr. Lorenza Cambridge  . TONSILLECTOMY AND ADENOIDECTOMY     Social History:   reports that  has never smoked. she has never used smokeless tobacco. She reports that she does not drink alcohol or use drugs.  Family History  Problem Relation Age of Onset  . Cancer Mother        spindle cell carcinoma on neck  . Colon cancer Father 40  . Breast cancer Sister   . Colon polyps Sister     . Heart disease Brother   . Breast cancer Unknown        neiece x 3  . Esophageal cancer Neg Hx   . Pancreatic cancer Neg Hx   . Kidney disease Neg Hx   . Liver disease Neg Hx     Medications:   Medication List        Accurate as of 07/26/17 11:22 AM. Always use your most recent med list.          alendronate 70 MG tablet Commonly known as:  FOSAMAX Take 1 tablet (70 mg total) by mouth every 7 (seven) days. Once a Week. Take with a full glass of water on an empty stomach.   aspirin 81 MG tablet   bimatoprost 0.01 % Soln Commonly known as:  LUMIGAN   DULoxetine 30 MG capsule Commonly known as:  CYMBALTA Take 1 capsule (30 mg total) by mouth daily.   FIBER CHOICE PO   Fish Oil 1200 MG Caps   fluticasone 50 MCG/ACT nasal spray Commonly known as:  FLONASE   gabapentin 300 MG capsule Commonly known as:  NEURONTIN   ibuprofen 200 MG tablet Commonly known as:  ADVIL,MOTRIN   levothyroxine 50 MCG tablet Commonly known as:  SYNTHROID, LEVOTHROID TAKE 1 TABLET DAILY FOR THYROID SUPPLEMENT   loratadine 10 MG tablet Commonly known as:  CLARITIN   multivitamin with minerals Tabs tablet   omeprazole 20 MG capsule Commonly known as:  PRILOSEC TAKE 1 CAPSULE DAILY TO REDUCE STOMACH ACID.   SYSTANE ULTRA OP   UNABLE TO FIND Take 1 tablet daily by mouth. Bone Care: Calcium 1000mg , magnesium, vitamins D3 2000 units and K: Take one tablet by mouth every evening.        Physical Exam:  Vitals:   07/26/17 1114  BP: 136/82  Pulse: 69  Resp: 17  Temp: 97.9 F (36.6 C)  TempSrc: Oral  Weight: 176 lb (79.8 kg)  Height: 5\' 3"  (1.6 m)   Body mass index is 31.18 kg/m.  Physical Exam  Constitutional: She is oriented to person, place, and time. She appears well-developed and well-nourished.  HENT:  Head: Normocephalic and atraumatic.  Eyes:  Glasses  Cardiovascular: Normal rate, regular rhythm, normal heart sounds and intact distal pulses.   Pulmonary/Chest: Effort normal and breath sounds normal. She has no wheezes.  Musculoskeletal: She exhibits edema and tenderness.  Tenderness to left calf and sides of knee on left. No tenderness to right leg/calf  Neurological: She is alert and oriented to person, place, and time. Gait (slow and unsteady, uses cane) abnormal.  Skin: Skin is warm and dry. Capillary refill takes less than 2 seconds.  Psychiatric: She has a normal mood and affect.    Labs reviewed: Basic Metabolic Panel: Recent Labs  12/31/16 0917 02/26/17 1230 07/01/17 1035  NA 141 142 138  K 4.1 4.0 3.9  CL 105 106 102  CO2 24 26 32  GLUCOSE 106* 110* 107*  BUN 15 15 12   CREATININE 0.67 0.71 0.66  CALCIUM 9.6 9.4 9.0  TSH 1.97 1.24  --    Liver Function Tests: Recent Labs    12/31/16 0917 02/26/17 1230 07/01/17 1035  AST 19 21 18   ALT 15 14 15   ALKPHOS 43 42  --   BILITOT 0.7 0.7 0.6  PROT 7.0 6.8 6.4  ALBUMIN 4.2 4.2  --    No results for input(s): LIPASE, AMYLASE in the last 8760 hours. No results for input(s): AMMONIA in the last 8760 hours. CBC: Recent Labs    02/26/17 1230  WBC 6.1  NEUTROABS 3,538  HGB 12.7  HCT 38.1  MCV 94.3  PLT 153   Lipid Panel: Recent Labs    12/31/16 0917 07/01/17 1035  CHOL 265* 265*  HDL 87 93  LDLCALC 163*  --   TRIG 74 75  CHOLHDL 3.0 2.8   TSH: Recent Labs    12/31/16 0917 02/26/17 1230  TSH 1.97 1.24   A1C: Lab Results  Component Value Date   HGBA1C 5.3 07/01/2017     Assessment/Plan 1. Acute deep vein thrombosis (DVT) of tibial vein of left lower extremity ([facility]) -noted on doppler. Discussed risk and benefits of anticoagulation. Will start eliquis 10 mg by mouth twice daily for 7 days then to decrease to 5 mg by mouth twice daily.  To avoid NSAIDS while on eliquis.  -may stop ASA since she will be on eliquis.   2. Popliteal cyst, left -noted on doppler, discuss possibility of having IR evaluate to see if this could be drained,  at this time will treat DVT and may refer in the future.   May use tylenol 500 mg 1-2 tablets every 8 hours as needed pain.   Next appt: as scheduled with Dr Mariea Clonts, sooner if needed  Zachary K. Harle Battiest  [facility] [phone] 8 am - 5 pm) [phone] (after hours)

## 2017-07-30 DIAGNOSIS — M810 Age-related osteoporosis without current pathological fracture: Secondary | ICD-10-CM | POA: Diagnosis not present

## 2017-07-30 DIAGNOSIS — G8929 Other chronic pain: Secondary | ICD-10-CM | POA: Diagnosis not present

## 2017-07-30 DIAGNOSIS — R32 Unspecified urinary incontinence: Secondary | ICD-10-CM | POA: Diagnosis not present

## 2017-07-30 DIAGNOSIS — M25562 Pain in left knee: Secondary | ICD-10-CM | POA: Diagnosis not present

## 2017-07-31 ENCOUNTER — Telehealth: Payer: Self-pay | Admitting: *Deleted

## 2017-07-31 NOTE — Telephone Encounter (Signed)
Meredith with [facility] called requesting verbal orders for continued PT visits 1wkX1, 2wkX4 and also RN eval related to recent blood clot/med changes. Verbal order given.

## 2017-08-05 DIAGNOSIS — R32 Unspecified urinary incontinence: Secondary | ICD-10-CM | POA: Diagnosis not present

## 2017-08-05 DIAGNOSIS — M25562 Pain in left knee: Secondary | ICD-10-CM | POA: Diagnosis not present

## 2017-08-05 DIAGNOSIS — M810 Age-related osteoporosis without current pathological fracture: Secondary | ICD-10-CM | POA: Diagnosis not present

## 2017-08-05 DIAGNOSIS — G8929 Other chronic pain: Secondary | ICD-10-CM | POA: Diagnosis not present

## 2017-08-07 DIAGNOSIS — G8929 Other chronic pain: Secondary | ICD-10-CM | POA: Diagnosis not present

## 2017-08-07 DIAGNOSIS — M25562 Pain in left knee: Secondary | ICD-10-CM | POA: Diagnosis not present

## 2017-08-07 DIAGNOSIS — M810 Age-related osteoporosis without current pathological fracture: Secondary | ICD-10-CM | POA: Diagnosis not present

## 2017-08-07 DIAGNOSIS — R32 Unspecified urinary incontinence: Secondary | ICD-10-CM | POA: Diagnosis not present

## 2017-08-08 DIAGNOSIS — G8929 Other chronic pain: Secondary | ICD-10-CM | POA: Diagnosis not present

## 2017-08-08 DIAGNOSIS — R32 Unspecified urinary incontinence: Secondary | ICD-10-CM | POA: Diagnosis not present

## 2017-08-08 DIAGNOSIS — M25562 Pain in left knee: Secondary | ICD-10-CM | POA: Diagnosis not present

## 2017-08-08 DIAGNOSIS — M810 Age-related osteoporosis without current pathological fracture: Secondary | ICD-10-CM | POA: Diagnosis not present

## 2017-08-12 DIAGNOSIS — G8929 Other chronic pain: Secondary | ICD-10-CM | POA: Diagnosis not present

## 2017-08-12 DIAGNOSIS — M25562 Pain in left knee: Secondary | ICD-10-CM | POA: Diagnosis not present

## 2017-08-12 DIAGNOSIS — M810 Age-related osteoporosis without current pathological fracture: Secondary | ICD-10-CM | POA: Diagnosis not present

## 2017-08-12 DIAGNOSIS — R32 Unspecified urinary incontinence: Secondary | ICD-10-CM | POA: Diagnosis not present

## 2017-08-13 DIAGNOSIS — L570 Actinic keratosis: Secondary | ICD-10-CM | POA: Diagnosis not present

## 2017-08-13 DIAGNOSIS — D1801 Hemangioma of skin and subcutaneous tissue: Secondary | ICD-10-CM | POA: Diagnosis not present

## 2017-08-13 DIAGNOSIS — L821 Other seborrheic keratosis: Secondary | ICD-10-CM | POA: Diagnosis not present

## 2017-08-13 DIAGNOSIS — M25562 Pain in left knee: Secondary | ICD-10-CM | POA: Diagnosis not present

## 2017-08-13 DIAGNOSIS — G8929 Other chronic pain: Secondary | ICD-10-CM | POA: Diagnosis not present

## 2017-08-13 DIAGNOSIS — C44311 Basal cell carcinoma of skin of nose: Secondary | ICD-10-CM | POA: Diagnosis not present

## 2017-08-13 DIAGNOSIS — L814 Other melanin hyperpigmentation: Secondary | ICD-10-CM | POA: Diagnosis not present

## 2017-08-13 DIAGNOSIS — L853 Xerosis cutis: Secondary | ICD-10-CM | POA: Diagnosis not present

## 2017-08-13 DIAGNOSIS — R32 Unspecified urinary incontinence: Secondary | ICD-10-CM | POA: Diagnosis not present

## 2017-08-13 DIAGNOSIS — D225 Melanocytic nevi of trunk: Secondary | ICD-10-CM | POA: Diagnosis not present

## 2017-08-13 DIAGNOSIS — M810 Age-related osteoporosis without current pathological fracture: Secondary | ICD-10-CM | POA: Diagnosis not present

## 2017-08-15 DIAGNOSIS — M25562 Pain in left knee: Secondary | ICD-10-CM | POA: Diagnosis not present

## 2017-08-15 DIAGNOSIS — G8929 Other chronic pain: Secondary | ICD-10-CM | POA: Diagnosis not present

## 2017-08-15 DIAGNOSIS — M810 Age-related osteoporosis without current pathological fracture: Secondary | ICD-10-CM | POA: Diagnosis not present

## 2017-08-15 DIAGNOSIS — R32 Unspecified urinary incontinence: Secondary | ICD-10-CM | POA: Diagnosis not present

## 2017-08-20 DIAGNOSIS — M810 Age-related osteoporosis without current pathological fracture: Secondary | ICD-10-CM | POA: Diagnosis not present

## 2017-08-20 DIAGNOSIS — G8929 Other chronic pain: Secondary | ICD-10-CM | POA: Diagnosis not present

## 2017-08-20 DIAGNOSIS — R32 Unspecified urinary incontinence: Secondary | ICD-10-CM | POA: Diagnosis not present

## 2017-08-20 DIAGNOSIS — M25562 Pain in left knee: Secondary | ICD-10-CM | POA: Diagnosis not present

## 2017-08-21 ENCOUNTER — Telehealth: Payer: Self-pay

## 2017-08-21 DIAGNOSIS — G8929 Other chronic pain: Secondary | ICD-10-CM | POA: Diagnosis not present

## 2017-08-21 DIAGNOSIS — M810 Age-related osteoporosis without current pathological fracture: Secondary | ICD-10-CM | POA: Diagnosis not present

## 2017-08-21 DIAGNOSIS — M25562 Pain in left knee: Secondary | ICD-10-CM | POA: Diagnosis not present

## 2017-08-21 DIAGNOSIS — R32 Unspecified urinary incontinence: Secondary | ICD-10-CM | POA: Diagnosis not present

## 2017-08-21 NOTE — Telephone Encounter (Signed)
Patient called to find out if there is a less expensive medication she can take to prevent blood clots. She stated that the eliquis costs $503.50 at every refill. She stated that she cannot afford this for long.    Please advise if there is another medication that can be sent in to [facility]

## 2017-08-21 NOTE — Telephone Encounter (Signed)
Patient called to say that she spoke with patient assistance and she was told that she would qualify for assistance.   Patient is concerned about not understanding information in packet, so pt was told that she could bring packet to office for assistance completing it.

## 2017-08-21 NOTE — Telephone Encounter (Signed)
Lets provide her with samples at this time. We can also see if xarelto would be cheaper based on her plan if next year it is the same amount

## 2017-08-21 NOTE — Telephone Encounter (Signed)
I spoke with patient to let her know that we have samples of eliquis ready to be picked up from the office. Patient verbalized understanding.   I asked for patient's permission to call the Eliquis 360 support line on her behalf to find out about a patient assistance program. Patient gave permission.   I spoke with support tech and was told to have patient call the main patient assistance line at [phone].   Patient verbalized understanding.

## 2017-08-21 NOTE — Telephone Encounter (Signed)
Thank you for helping her out Coralyn Mark.

## 2017-08-21 NOTE — Telephone Encounter (Signed)
Left a message on voicemail of Rep asking for samples of Eliquis.

## 2017-08-22 DIAGNOSIS — R32 Unspecified urinary incontinence: Secondary | ICD-10-CM | POA: Diagnosis not present

## 2017-08-22 DIAGNOSIS — G8929 Other chronic pain: Secondary | ICD-10-CM | POA: Diagnosis not present

## 2017-08-22 DIAGNOSIS — M25562 Pain in left knee: Secondary | ICD-10-CM | POA: Diagnosis not present

## 2017-08-22 DIAGNOSIS — M810 Age-related osteoporosis without current pathological fracture: Secondary | ICD-10-CM | POA: Diagnosis not present

## 2017-08-22 DIAGNOSIS — I82421 Acute embolism and thrombosis of right iliac vein: Secondary | ICD-10-CM | POA: Diagnosis not present

## 2017-08-22 LAB — POCT INR: INR: 1.1 (ref 0.9–1.1)

## 2017-08-23 ENCOUNTER — Encounter: Payer: Self-pay | Admitting: *Deleted

## 2017-08-26 DIAGNOSIS — H0100A Unspecified blepharitis right eye, upper and lower eyelids: Secondary | ICD-10-CM | POA: Diagnosis not present

## 2017-08-26 DIAGNOSIS — H40013 Open angle with borderline findings, low risk, bilateral: Secondary | ICD-10-CM | POA: Diagnosis not present

## 2017-08-26 DIAGNOSIS — H52203 Unspecified astigmatism, bilateral: Secondary | ICD-10-CM | POA: Diagnosis not present

## 2017-08-26 DIAGNOSIS — H1859 Other hereditary corneal dystrophies: Secondary | ICD-10-CM | POA: Diagnosis not present

## 2017-08-27 DIAGNOSIS — M25562 Pain in left knee: Secondary | ICD-10-CM | POA: Diagnosis not present

## 2017-08-27 DIAGNOSIS — G8929 Other chronic pain: Secondary | ICD-10-CM | POA: Diagnosis not present

## 2017-08-27 DIAGNOSIS — R32 Unspecified urinary incontinence: Secondary | ICD-10-CM | POA: Diagnosis not present

## 2017-08-27 DIAGNOSIS — M810 Age-related osteoporosis without current pathological fracture: Secondary | ICD-10-CM | POA: Diagnosis not present

## 2017-08-29 DIAGNOSIS — R32 Unspecified urinary incontinence: Secondary | ICD-10-CM | POA: Diagnosis not present

## 2017-08-29 DIAGNOSIS — M25562 Pain in left knee: Secondary | ICD-10-CM | POA: Diagnosis not present

## 2017-08-29 DIAGNOSIS — G8929 Other chronic pain: Secondary | ICD-10-CM | POA: Diagnosis not present

## 2017-08-29 DIAGNOSIS — M810 Age-related osteoporosis without current pathological fracture: Secondary | ICD-10-CM | POA: Diagnosis not present

## 2017-08-30 ENCOUNTER — Other Ambulatory Visit: Payer: Self-pay | Admitting: Nurse Practitioner

## 2017-09-02 DIAGNOSIS — R32 Unspecified urinary incontinence: Secondary | ICD-10-CM | POA: Diagnosis not present

## 2017-09-02 DIAGNOSIS — M810 Age-related osteoporosis without current pathological fracture: Secondary | ICD-10-CM | POA: Diagnosis not present

## 2017-09-02 DIAGNOSIS — M25562 Pain in left knee: Secondary | ICD-10-CM | POA: Diagnosis not present

## 2017-09-02 DIAGNOSIS — G8929 Other chronic pain: Secondary | ICD-10-CM | POA: Diagnosis not present

## 2017-09-17 DIAGNOSIS — R32 Unspecified urinary incontinence: Secondary | ICD-10-CM | POA: Diagnosis not present

## 2017-09-17 DIAGNOSIS — M25562 Pain in left knee: Secondary | ICD-10-CM | POA: Diagnosis not present

## 2017-09-17 DIAGNOSIS — G8929 Other chronic pain: Secondary | ICD-10-CM | POA: Diagnosis not present

## 2017-09-17 DIAGNOSIS — M810 Age-related osteoporosis without current pathological fracture: Secondary | ICD-10-CM | POA: Diagnosis not present

## 2017-09-18 ENCOUNTER — Other Ambulatory Visit: Payer: Self-pay | Admitting: *Deleted

## 2017-09-18 MED ORDER — APIXABAN 5 MG PO TABS: 5.0000 mg | ORAL_TABLET | Freq: Two times a day (BID) | ORAL | 1 refills | Status: DC

## 2017-09-18 NOTE — Telephone Encounter (Signed)
Patient called requested refill to be faxed to [facility]. Faxed.

## 2017-09-20 DIAGNOSIS — M48062 Spinal stenosis, lumbar region with neurogenic claudication: Secondary | ICD-10-CM | POA: Diagnosis not present

## 2017-09-20 DIAGNOSIS — M1712 Unilateral primary osteoarthritis, left knee: Secondary | ICD-10-CM | POA: Diagnosis not present

## 2017-10-16 DIAGNOSIS — M810 Age-related osteoporosis without current pathological fracture: Secondary | ICD-10-CM | POA: Diagnosis not present

## 2017-10-16 DIAGNOSIS — R260 Ataxic gait: Secondary | ICD-10-CM | POA: Diagnosis not present

## 2017-10-16 DIAGNOSIS — Z7901 Long term (current) use of anticoagulants: Secondary | ICD-10-CM | POA: Diagnosis not present

## 2017-10-16 DIAGNOSIS — R531 Weakness: Secondary | ICD-10-CM | POA: Diagnosis not present

## 2017-10-16 DIAGNOSIS — Z86718 Personal history of other venous thrombosis and embolism: Secondary | ICD-10-CM | POA: Diagnosis not present

## 2017-10-16 DIAGNOSIS — Z9181 History of falling: Secondary | ICD-10-CM | POA: Diagnosis not present

## 2017-10-18 DIAGNOSIS — I8312 Varicose veins of left lower extremity with inflammation: Secondary | ICD-10-CM | POA: Diagnosis not present

## 2017-10-18 DIAGNOSIS — Z85828 Personal history of other malignant neoplasm of skin: Secondary | ICD-10-CM | POA: Diagnosis not present

## 2017-10-21 DIAGNOSIS — M810 Age-related osteoporosis without current pathological fracture: Secondary | ICD-10-CM | POA: Diagnosis not present

## 2017-10-21 DIAGNOSIS — R531 Weakness: Secondary | ICD-10-CM | POA: Diagnosis not present

## 2017-10-21 DIAGNOSIS — Z7901 Long term (current) use of anticoagulants: Secondary | ICD-10-CM | POA: Diagnosis not present

## 2017-10-21 DIAGNOSIS — Z9181 History of falling: Secondary | ICD-10-CM | POA: Diagnosis not present

## 2017-10-21 DIAGNOSIS — Z86718 Personal history of other venous thrombosis and embolism: Secondary | ICD-10-CM | POA: Diagnosis not present

## 2017-10-21 DIAGNOSIS — R260 Ataxic gait: Secondary | ICD-10-CM | POA: Diagnosis not present

## 2017-10-24 DIAGNOSIS — R260 Ataxic gait: Secondary | ICD-10-CM | POA: Diagnosis not present

## 2017-10-24 DIAGNOSIS — R531 Weakness: Secondary | ICD-10-CM | POA: Diagnosis not present

## 2017-10-24 DIAGNOSIS — M810 Age-related osteoporosis without current pathological fracture: Secondary | ICD-10-CM | POA: Diagnosis not present

## 2017-10-24 DIAGNOSIS — Z7901 Long term (current) use of anticoagulants: Secondary | ICD-10-CM | POA: Diagnosis not present

## 2017-10-24 DIAGNOSIS — Z86718 Personal history of other venous thrombosis and embolism: Secondary | ICD-10-CM | POA: Diagnosis not present

## 2017-10-24 DIAGNOSIS — Z9181 History of falling: Secondary | ICD-10-CM | POA: Diagnosis not present

## 2017-10-28 DIAGNOSIS — R260 Ataxic gait: Secondary | ICD-10-CM | POA: Diagnosis not present

## 2017-10-28 DIAGNOSIS — Z7901 Long term (current) use of anticoagulants: Secondary | ICD-10-CM | POA: Diagnosis not present

## 2017-10-28 DIAGNOSIS — Z86718 Personal history of other venous thrombosis and embolism: Secondary | ICD-10-CM | POA: Diagnosis not present

## 2017-10-28 DIAGNOSIS — Z9181 History of falling: Secondary | ICD-10-CM | POA: Diagnosis not present

## 2017-10-28 DIAGNOSIS — R531 Weakness: Secondary | ICD-10-CM | POA: Diagnosis not present

## 2017-10-28 DIAGNOSIS — M810 Age-related osteoporosis without current pathological fracture: Secondary | ICD-10-CM | POA: Diagnosis not present

## 2017-11-01 DIAGNOSIS — Z86718 Personal history of other venous thrombosis and embolism: Secondary | ICD-10-CM | POA: Diagnosis not present

## 2017-11-01 DIAGNOSIS — M810 Age-related osteoporosis without current pathological fracture: Secondary | ICD-10-CM | POA: Diagnosis not present

## 2017-11-01 DIAGNOSIS — R260 Ataxic gait: Secondary | ICD-10-CM | POA: Diagnosis not present

## 2017-11-01 DIAGNOSIS — R531 Weakness: Secondary | ICD-10-CM | POA: Diagnosis not present

## 2017-11-01 DIAGNOSIS — Z9181 History of falling: Secondary | ICD-10-CM | POA: Diagnosis not present

## 2017-11-01 DIAGNOSIS — Z7901 Long term (current) use of anticoagulants: Secondary | ICD-10-CM | POA: Diagnosis not present

## 2017-11-04 DIAGNOSIS — M1711 Unilateral primary osteoarthritis, right knee: Secondary | ICD-10-CM | POA: Diagnosis not present

## 2017-11-04 DIAGNOSIS — M1712 Unilateral primary osteoarthritis, left knee: Secondary | ICD-10-CM | POA: Diagnosis not present

## 2017-11-04 DIAGNOSIS — M48062 Spinal stenosis, lumbar region with neurogenic claudication: Secondary | ICD-10-CM | POA: Diagnosis not present

## 2017-11-06 DIAGNOSIS — R260 Ataxic gait: Secondary | ICD-10-CM | POA: Diagnosis not present

## 2017-11-06 DIAGNOSIS — M810 Age-related osteoporosis without current pathological fracture: Secondary | ICD-10-CM | POA: Diagnosis not present

## 2017-11-06 DIAGNOSIS — Z86718 Personal history of other venous thrombosis and embolism: Secondary | ICD-10-CM | POA: Diagnosis not present

## 2017-11-06 DIAGNOSIS — R531 Weakness: Secondary | ICD-10-CM | POA: Diagnosis not present

## 2017-11-06 DIAGNOSIS — Z7901 Long term (current) use of anticoagulants: Secondary | ICD-10-CM | POA: Diagnosis not present

## 2017-11-06 DIAGNOSIS — Z9181 History of falling: Secondary | ICD-10-CM | POA: Diagnosis not present

## 2017-11-08 DIAGNOSIS — M810 Age-related osteoporosis without current pathological fracture: Secondary | ICD-10-CM | POA: Diagnosis not present

## 2017-11-08 DIAGNOSIS — Z7901 Long term (current) use of anticoagulants: Secondary | ICD-10-CM | POA: Diagnosis not present

## 2017-11-08 DIAGNOSIS — R531 Weakness: Secondary | ICD-10-CM | POA: Diagnosis not present

## 2017-11-08 DIAGNOSIS — R260 Ataxic gait: Secondary | ICD-10-CM | POA: Diagnosis not present

## 2017-11-08 DIAGNOSIS — Z9181 History of falling: Secondary | ICD-10-CM | POA: Diagnosis not present

## 2017-11-08 DIAGNOSIS — Z86718 Personal history of other venous thrombosis and embolism: Secondary | ICD-10-CM | POA: Diagnosis not present

## 2017-11-11 ENCOUNTER — Ambulatory Visit (INDEPENDENT_AMBULATORY_CARE_PROVIDER_SITE_OTHER): Payer: Medicare Other | Admitting: Internal Medicine

## 2017-11-11 ENCOUNTER — Encounter: Payer: Self-pay | Admitting: Internal Medicine

## 2017-11-11 VITALS — BP 120/80 | HR 73 | Temp 97.5°F | Wt 182.0 lb

## 2017-11-11 DIAGNOSIS — W19XXXA Unspecified fall, initial encounter: Secondary | ICD-10-CM

## 2017-11-11 DIAGNOSIS — M810 Age-related osteoporosis without current pathological fracture: Secondary | ICD-10-CM

## 2017-11-11 DIAGNOSIS — E785 Hyperlipidemia, unspecified: Secondary | ICD-10-CM | POA: Diagnosis not present

## 2017-11-11 DIAGNOSIS — I82442 Acute embolism and thrombosis of left tibial vein: Secondary | ICD-10-CM | POA: Diagnosis not present

## 2017-11-11 DIAGNOSIS — M25562 Pain in left knee: Secondary | ICD-10-CM | POA: Diagnosis not present

## 2017-11-11 DIAGNOSIS — M1712 Unilateral primary osteoarthritis, left knee: Secondary | ICD-10-CM | POA: Diagnosis not present

## 2017-11-11 DIAGNOSIS — G8929 Other chronic pain: Secondary | ICD-10-CM

## 2017-11-11 LAB — CBC WITH DIFFERENTIAL/PLATELET
Basophils Absolute: 50 cells/uL (ref 0–200)
Basophils Relative: 0.8 %
Eosinophils Absolute: 99 cells/uL (ref 15–500)
Eosinophils Relative: 1.6 %
HCT: 36.8 % (ref 35.0–45.0)
Hemoglobin: 12.7 g/dL (ref 11.7–15.5)
Lymphs Abs: 2046 cells/uL (ref 850–3900)
MCH: 31.2 pg (ref 27.0–33.0)
MCHC: 34.5 g/dL (ref 32.0–36.0)
MCV: 90.4 fL (ref 80.0–100.0)
MPV: 10.2 fL (ref 7.5–12.5)
Monocytes Relative: 8 %
Neutro Abs: 3509 cells/uL (ref 1500–7800)
Neutrophils Relative %: 56.6 %
Platelets: 192 10*3/uL (ref 140–400)
RBC: 4.07 10*6/uL (ref 3.80–5.10)
RDW: 11.8 % (ref 11.0–15.0)
Total Lymphocyte: 33 %
WBC mixed population: 496 cells/uL (ref 200–950)
WBC: 6.2 10*3/uL (ref 3.8–10.8)

## 2017-11-11 LAB — BASIC METABOLIC PANEL
BUN: 13 mg/dL (ref 7–25)
CO2: 31 mmol/L (ref 20–32)
Calcium: 9.1 mg/dL (ref 8.6–10.4)
Chloride: 104 mmol/L (ref 98–110)
Creat: 0.73 mg/dL (ref 0.60–0.88)
Glucose, Bld: 99 mg/dL (ref 65–139)
Potassium: 4.2 mmol/L (ref 3.5–5.3)
Sodium: 142 mmol/L (ref 135–146)

## 2017-11-11 MED ORDER — GABAPENTIN 300 MG PO CAPS: 300.0000 mg | ORAL_CAPSULE | Freq: Every day | ORAL | 0 refills | Status: DC

## 2017-11-11 NOTE — Patient Instructions (Addendum)
Move slowly, use cane or walker. Call the office if you have a fall. We would like to make sure you are safe and without injury when these events happen.   Keep taking medications as prescribed.   We ordered home health, you have someone coming out now and we would like to continue this a little longer to build strength and to make your home as safe as possible.  Please ask the home health therapist about getting her walker. It was order by Ortho on 11/11/2017. If they cannot get it, you can get one at or from [facility] home care or the discount medical supply store.

## 2017-11-11 NOTE — Progress Notes (Signed)
Location:  [facility] clinic Provider:  Tiffany L. Mariea Clonts, D.O., C.M.D.  Code Status: DNR Goals of Care:  Advanced Directives 11/11/2017  Does Patient Have a Medical Advance Directive? Yes  Type of Advance Directive Out of facility DNR (pink MOST or yellow form);Living will  Does patient want to make changes to medical advance directive? No - Patient declined  Copy of [place] in Chart? -  Pre-existing out of facility DNR order (yellow form or pink MOST form) Yellow form placed in chart (order not valid for inpatient use)     Chief Complaint  Patient presents with  . Medical Management of Chronic Issues    50mth follow-up  . ACP    DNR, LIVING WILL    HPI: Patient is a 84 y.o. female seen today for medical management of chronic diseases.  Today in office she feels pretty good in general, but her knee continues to give her trouble. She has been by the ortho this morning and received a shot of Gelsyn while there. She can not tell much of a change. Feels like it is pulling in the back of the leg. They reported to her she had some arthritis and some spurs.   DVT: She was found to have a clot Left tibial vein back in Nov. She was started on Eliquis and reports taking this as prescribed. She denies bleeding in stool or urine. She denies nose bleeds. She is bruising easier, but reports this is not as new as she always has. She has noticed she is chilly during the day.   Osteoporosis: Charolette Forward she is taking it every Saturday. She feels okay with it, she denies pain and upset stomach.   She reports taking all other medications on list as ordered without trouble. She does take Prilosec with crackers to help with upset stomach.   She is not eating as well, as she cannot figure out what to eat. She reports her diet is not the best. She takes Omega 3 to help with cholesterol, last check these were elevated.   She reports walking around her home and to mail box, but this is reduced. She  has an order for a front wheel walker from Ortho. She reports falling on valentines day and another one that happened earlier in the year. She knew she was about to fall, but was not able to get into a chair quick enough on valentines day (used life alert and fire department came to help). And the first one took place a mall when she was trying to put a cart up. She had help getting up there. She denies injuries from falls. Mild bruising, but overall okay.   She reports having a hard time sleeping and then she gets up frequently at night. It is worse at night she thinks due to gabapentin. She has started wearing depends to help with leaking. She reports incontinence is more frequent now.   She reports trouble with showering and bathing as she cannot get in and out. She reports having being afraid of getting stuck. She takes bird baths to wash up. She does get dress and does her own laundry. She lives alone. She pays all her bills. She does not drive, she gave that up last May. She has a Industrial/product designer, church friend, and son. They help her get to store to get food. She is still cooking.   She denies confusion. But she repeats herself throughout the visit some and she has reduction in  short term recall throughout the visit.   Past Medical History:  Diagnosis Date  . Abdominal pain, epigastric   . Allergic rhinitis due to other allergen   . Allergic rhinitis, cause unspecified   . Dermatophytosis of nail   . Diffuse cystic mastopathy   . Diverticulosis of colon (without mention of hemorrhage)   . Dysphagia, unspecified(787.20)   . Esophageal reflux   . Family history of malignant neoplasm of gastrointestinal tract   . Hiatal hernia   . Hyperlipidemia   . Hypertension   . Internal hemorrhoids without mention of complication   . 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 Obesity   . Oral aphthae   . Osteoarthrosis, unspecified whether generalized or localized, unspecified site   . Other  abnormal blood chemistry   . Other voice and resonance disorders   . Pain in joint, pelvic region and thigh   . Palpitations   . Pancreatitis   . Peripheral vascular disease, unspecified ([place])   . Schatzki's ring   . Senile osteoporosis   . Torus fracture of fibula   . Unspecified constipation   . Unspecified hypothyroidism     Past Surgical History:  Procedure Laterality Date  . APPENDECTOMY    . BASAL CELL CARCINOMA EXCISION     right humerus, right temple, left side of nose  . BASAL CELL CARCINOMA EXCISION     chest, left cheek  . CATARACT EXTRACTION, BILATERAL    . CHOLECYSTECTOMY    . keratosis     left arm  . resection of ampullary tumor    . squamous cell cancer     Right leg  . squamous cell cancer left triceps Left    Dr. Lorenza Cambridge  . TONSILLECTOMY AND ADENOIDECTOMY      Allergies  Allergen Reactions  . Codeine Nausea And Vomiting  . Lodine [Etodolac] Nausea And Vomiting  . Penicillins     unknown    Outpatient Encounter Medications as of 11/11/2017  Medication Sig  . alendronate (FOSAMAX) 70 MG tablet Take 1 tablet (70 mg total) by mouth every 7 (seven) days. Once a Week. Take with a full glass of water on an empty stomach.  Marland Kitchen apixaban (ELIQUIS) 5 MG TABS tablet Take 1 tablet (5 mg total) by mouth 2 (two) times daily.  . bimatoprost (LUMIGAN) 0.01 % SOLN Place 1 drop into both eyes at bedtime.  . DULoxetine (CYMBALTA) 30 MG capsule Take 1 capsule (30 mg total) by mouth daily.  . fluticasone (FLONASE) 50 MCG/ACT nasal spray Place 1 spray into both nostrils at bedtime.  . gabapentin (NEURONTIN) 300 MG capsule Take 300 mg by mouth at bedtime.  . Inulin (FIBER CHOICE PO) Take 5 g by mouth 2 (two) times daily.  Marland Kitchen levothyroxine (SYNTHROID, LEVOTHROID) 50 MCG tablet TAKE 1 TABLET DAILY FOR THYROID SUPPLEMENT  . Multiple Vitamin (MULTIVITAMIN WITH MINERALS) TABS tablet Take 1 tablet by mouth daily.  . Omega-3 Fatty Acids (FISH OIL) 1200 MG CAPS Take 1,200 mg by  mouth 3 (three) times daily.   Marland Kitchen omeprazole (PRILOSEC) 20 MG capsule TAKE 1 CAPSULE DAILY TO REDUCE STOMACH ACID.  Marland Kitchen Polyethyl Glycol-Propyl Glycol (SYSTANE ULTRA OP) Apply 1 drop to eye as needed. Both eyes for dry eyes  . UNABLE TO FIND Take 1 tablet daily by mouth. Bone Care: Calcium 1000mg , magnesium, vitamins D3 2000 units and K: Take one tablet by mouth every evening.  . [DISCONTINUED] loratadine (CLARITIN) 10 MG tablet Take  10 mg by mouth daily.   No facility-administered encounter medications on file as of 11/11/2017.     Review of Systems:  Review of Systems  Constitutional: Negative.   Eyes:       Glasses  Respiratory: Negative for cough and shortness of breath.   Cardiovascular: Positive for leg swelling. Negative for chest pain and palpitations.  Gastrointestinal: Negative for blood in stool.  Genitourinary: Negative for hematuria.  Musculoskeletal: Positive for joint pain.       Left knee      Health Maintenance  Topic Date Due  . PNA vac Low Risk Adult (2 of 2 - PCV13) 07/04/2017  . TETANUS/TDAP  09/10/2017  . MAMMOGRAM  01/29/2018  . INFLUENZA VACCINE  Completed  . DEXA SCAN  Completed    Physical Exam: Vitals:   11/11/17 1105  BP: 120/80  Pulse: 73  Temp: (!) 97.5 F (36.4 C)  TempSrc: Oral  SpO2: 97%  Weight: 182 lb (82.6 kg)   Body mass index is 32.24 kg/m. Physical Exam  Constitutional: She is oriented to person, place, and time. She appears well-developed and well-nourished.  HENT:  Head: Normocephalic.  Cardiovascular: Normal rate, regular rhythm, normal heart sounds and intact distal pulses.  Leg swelling   Pulmonary/Chest: Effort normal and breath sounds normal.  Abdominal: Soft. Bowel sounds are normal.  Musculoskeletal: Normal range of motion. She exhibits edema.  Neurological: She is alert and oriented to person, place, and time.  Skin: Skin is warm and dry. Capillary refill takes less than 2 seconds.  Psychiatric: She has a normal mood  and affect. Her behavior is normal. Thought content normal.    Labs reviewed: Basic Metabolic Panel: Recent Labs    12/31/16 0917 02/26/17 1230 07/01/17 1035  NA 141 142 138  K 4.1 4.0 3.9  CL 105 106 102  CO2 24 26 32  GLUCOSE 106* 110* 107*  BUN 15 15 12   CREATININE 0.67 0.71 0.66  CALCIUM 9.6 9.4 9.0  TSH 1.97 1.24  --    Liver Function Tests: Recent Labs    12/31/16 0917 02/26/17 1230 07/01/17 1035  AST 19 21 18   ALT 15 14 15   ALKPHOS 43 42  --   BILITOT 0.7 0.7 0.6  PROT 7.0 6.8 6.4  ALBUMIN 4.2 4.2  --    No results for input(s): LIPASE, AMYLASE in the last 8760 hours. No results for input(s): AMMONIA in the last 8760 hours. CBC: Recent Labs    02/26/17 1230  WBC 6.1  NEUTROABS 3,538  HGB 12.7  HCT 38.1  MCV 94.3  PLT 153   Lipid Panel: Recent Labs    12/31/16 0917 07/01/17 1035  CHOL 265* 265*  HDL 87 93  LDLCALC 163*  --   TRIG 74 75  CHOLHDL 3.0 2.8   Lab Results  Component Value Date   HGBA1C 5.3 07/01/2017    Procedures since last visit: No results found.  Assessment/Plan  1. Chronic pain of left knee She has on going left knee pain that she gets Gelsyn injections by Ortho. She reports that she is unsure if it is working. She has the hardest time with standing up. She has had two falls this year already. She states she has home health coming out. Will reorder to provided an extended about of time for strength and home surveillance. We appreciate the help with her care from Ortho. They have ordered a walker to help with her safety and mobility.   -  Ambulatory referral to Home Health  2. Osteoporosis, unspecified osteoporosis type, unspecified pathological fracture presence She is on Fosamax right now and is tolerating it well. She is at high risk for falls as she has had two this year already. Continue medication regimen   - Ambulatory referral to [place]  3. Hyperlipidemia, unspecified hyperlipidemia type Her last check was  in Oct, but she reports not having a change in diet and not eating that well or the best. She is encourage to eat the best balance diet she can. Continue omega 3.  - Ambulatory referral to [place]  4. Fall, initial encounter She reports two falls and fear of getting into the shower. She states she has [facility] coming out to help with therapy. Will order an extension of this to help her feel safer and build her strength. Encourage to move slowly and to call our office next time she falls so we can be aware of her safety.   - Ambulatory referral to [place]  5. Acute deep vein thrombosis (DVT) of tibial vein of left lower extremity ([place]) She reports no signs of bleeding, but she states she is chilly. She has not had her labs assess since starting Eliquis so will check these today.   - CBC with Differential/Platelet - Basic metabolic panel   Labs/tests ordered:   Orders Placed This Encounter  Procedures  . CBC with Differential/Platelet  . Basic metabolic panel    Order Specific Question:   Has the patient fasted?    Answer:   Yes  . Ambulatory referral to Home Health    Referral Priority:   Routine    Referral Type:   Home Health Care    Referral Reason:   Specialty Services Required    Requested Specialty:   [place]    Number of Visits Requested:   1      Next appt:  Visit date not found   Karen Kays, RN, DNP [facility] [address], [place] [address] Cell Phone (Mon-Fri 8am-5pm):  [phone] On Call:  [phone] & follow prompts after 5pm & weekends Office Phone:  [phone] Office Fax:  [phone]

## 2017-11-13 DIAGNOSIS — Z86718 Personal history of other venous thrombosis and embolism: Secondary | ICD-10-CM | POA: Diagnosis not present

## 2017-11-13 DIAGNOSIS — M810 Age-related osteoporosis without current pathological fracture: Secondary | ICD-10-CM | POA: Diagnosis not present

## 2017-11-13 DIAGNOSIS — Z9181 History of falling: Secondary | ICD-10-CM | POA: Diagnosis not present

## 2017-11-13 DIAGNOSIS — R531 Weakness: Secondary | ICD-10-CM | POA: Diagnosis not present

## 2017-11-13 DIAGNOSIS — R260 Ataxic gait: Secondary | ICD-10-CM | POA: Diagnosis not present

## 2017-11-13 DIAGNOSIS — Z7901 Long term (current) use of anticoagulants: Secondary | ICD-10-CM | POA: Diagnosis not present

## 2017-11-15 DIAGNOSIS — R531 Weakness: Secondary | ICD-10-CM | POA: Diagnosis not present

## 2017-11-15 DIAGNOSIS — R260 Ataxic gait: Secondary | ICD-10-CM | POA: Diagnosis not present

## 2017-11-15 DIAGNOSIS — M810 Age-related osteoporosis without current pathological fracture: Secondary | ICD-10-CM | POA: Diagnosis not present

## 2017-11-15 DIAGNOSIS — Z7901 Long term (current) use of anticoagulants: Secondary | ICD-10-CM | POA: Diagnosis not present

## 2017-11-15 DIAGNOSIS — Z9181 History of falling: Secondary | ICD-10-CM | POA: Diagnosis not present

## 2017-11-15 DIAGNOSIS — Z86718 Personal history of other venous thrombosis and embolism: Secondary | ICD-10-CM | POA: Diagnosis not present

## 2017-11-18 DIAGNOSIS — M1712 Unilateral primary osteoarthritis, left knee: Secondary | ICD-10-CM | POA: Diagnosis not present

## 2017-11-19 DIAGNOSIS — M810 Age-related osteoporosis without current pathological fracture: Secondary | ICD-10-CM | POA: Diagnosis not present

## 2017-11-19 DIAGNOSIS — R260 Ataxic gait: Secondary | ICD-10-CM | POA: Diagnosis not present

## 2017-11-19 DIAGNOSIS — Z86718 Personal history of other venous thrombosis and embolism: Secondary | ICD-10-CM | POA: Diagnosis not present

## 2017-11-19 DIAGNOSIS — Z7901 Long term (current) use of anticoagulants: Secondary | ICD-10-CM | POA: Diagnosis not present

## 2017-11-19 DIAGNOSIS — R531 Weakness: Secondary | ICD-10-CM | POA: Diagnosis not present

## 2017-11-19 DIAGNOSIS — Z9181 History of falling: Secondary | ICD-10-CM | POA: Diagnosis not present

## 2017-11-21 DIAGNOSIS — Z7901 Long term (current) use of anticoagulants: Secondary | ICD-10-CM | POA: Diagnosis not present

## 2017-11-21 DIAGNOSIS — Z86718 Personal history of other venous thrombosis and embolism: Secondary | ICD-10-CM | POA: Diagnosis not present

## 2017-11-21 DIAGNOSIS — M810 Age-related osteoporosis without current pathological fracture: Secondary | ICD-10-CM | POA: Diagnosis not present

## 2017-11-21 DIAGNOSIS — R531 Weakness: Secondary | ICD-10-CM | POA: Diagnosis not present

## 2017-11-21 DIAGNOSIS — Z9181 History of falling: Secondary | ICD-10-CM | POA: Diagnosis not present

## 2017-11-21 DIAGNOSIS — R260 Ataxic gait: Secondary | ICD-10-CM | POA: Diagnosis not present

## 2017-11-27 DIAGNOSIS — R260 Ataxic gait: Secondary | ICD-10-CM | POA: Diagnosis not present

## 2017-11-27 DIAGNOSIS — R531 Weakness: Secondary | ICD-10-CM | POA: Diagnosis not present

## 2017-11-27 DIAGNOSIS — M810 Age-related osteoporosis without current pathological fracture: Secondary | ICD-10-CM | POA: Diagnosis not present

## 2017-11-27 DIAGNOSIS — Z86718 Personal history of other venous thrombosis and embolism: Secondary | ICD-10-CM | POA: Diagnosis not present

## 2017-11-27 DIAGNOSIS — Z9181 History of falling: Secondary | ICD-10-CM | POA: Diagnosis not present

## 2017-11-27 DIAGNOSIS — Z7901 Long term (current) use of anticoagulants: Secondary | ICD-10-CM | POA: Diagnosis not present

## 2017-11-29 DIAGNOSIS — Z9181 History of falling: Secondary | ICD-10-CM | POA: Diagnosis not present

## 2017-11-29 DIAGNOSIS — M810 Age-related osteoporosis without current pathological fracture: Secondary | ICD-10-CM | POA: Diagnosis not present

## 2017-11-29 DIAGNOSIS — Z86718 Personal history of other venous thrombosis and embolism: Secondary | ICD-10-CM | POA: Diagnosis not present

## 2017-11-29 DIAGNOSIS — Z7901 Long term (current) use of anticoagulants: Secondary | ICD-10-CM | POA: Diagnosis not present

## 2017-11-29 DIAGNOSIS — R531 Weakness: Secondary | ICD-10-CM | POA: Diagnosis not present

## 2017-11-29 DIAGNOSIS — R260 Ataxic gait: Secondary | ICD-10-CM | POA: Diagnosis not present

## 2017-12-03 DIAGNOSIS — R531 Weakness: Secondary | ICD-10-CM | POA: Diagnosis not present

## 2017-12-03 DIAGNOSIS — M810 Age-related osteoporosis without current pathological fracture: Secondary | ICD-10-CM | POA: Diagnosis not present

## 2017-12-03 DIAGNOSIS — R260 Ataxic gait: Secondary | ICD-10-CM | POA: Diagnosis not present

## 2017-12-03 DIAGNOSIS — Z7901 Long term (current) use of anticoagulants: Secondary | ICD-10-CM | POA: Diagnosis not present

## 2017-12-03 DIAGNOSIS — Z86718 Personal history of other venous thrombosis and embolism: Secondary | ICD-10-CM | POA: Diagnosis not present

## 2017-12-03 DIAGNOSIS — Z9181 History of falling: Secondary | ICD-10-CM | POA: Diagnosis not present

## 2017-12-04 ENCOUNTER — Other Ambulatory Visit: Payer: Self-pay | Admitting: Internal Medicine

## 2017-12-04 DIAGNOSIS — E039 Hypothyroidism, unspecified: Secondary | ICD-10-CM

## 2017-12-05 DIAGNOSIS — R260 Ataxic gait: Secondary | ICD-10-CM | POA: Diagnosis not present

## 2017-12-05 DIAGNOSIS — Z86718 Personal history of other venous thrombosis and embolism: Secondary | ICD-10-CM | POA: Diagnosis not present

## 2017-12-05 DIAGNOSIS — M810 Age-related osteoporosis without current pathological fracture: Secondary | ICD-10-CM | POA: Diagnosis not present

## 2017-12-05 DIAGNOSIS — Z7901 Long term (current) use of anticoagulants: Secondary | ICD-10-CM | POA: Diagnosis not present

## 2017-12-05 DIAGNOSIS — Z9181 History of falling: Secondary | ICD-10-CM | POA: Diagnosis not present

## 2017-12-05 DIAGNOSIS — R531 Weakness: Secondary | ICD-10-CM | POA: Diagnosis not present

## 2017-12-09 DIAGNOSIS — Z86718 Personal history of other venous thrombosis and embolism: Secondary | ICD-10-CM | POA: Diagnosis not present

## 2017-12-09 DIAGNOSIS — Z9181 History of falling: Secondary | ICD-10-CM | POA: Diagnosis not present

## 2017-12-09 DIAGNOSIS — R260 Ataxic gait: Secondary | ICD-10-CM | POA: Diagnosis not present

## 2017-12-09 DIAGNOSIS — M810 Age-related osteoporosis without current pathological fracture: Secondary | ICD-10-CM | POA: Diagnosis not present

## 2017-12-09 DIAGNOSIS — R531 Weakness: Secondary | ICD-10-CM | POA: Diagnosis not present

## 2017-12-09 DIAGNOSIS — Z7901 Long term (current) use of anticoagulants: Secondary | ICD-10-CM | POA: Diagnosis not present

## 2017-12-12 DIAGNOSIS — Z86718 Personal history of other venous thrombosis and embolism: Secondary | ICD-10-CM | POA: Diagnosis not present

## 2017-12-12 DIAGNOSIS — Z9181 History of falling: Secondary | ICD-10-CM | POA: Diagnosis not present

## 2017-12-12 DIAGNOSIS — M810 Age-related osteoporosis without current pathological fracture: Secondary | ICD-10-CM | POA: Diagnosis not present

## 2017-12-12 DIAGNOSIS — Z7901 Long term (current) use of anticoagulants: Secondary | ICD-10-CM | POA: Diagnosis not present

## 2017-12-12 DIAGNOSIS — R260 Ataxic gait: Secondary | ICD-10-CM | POA: Diagnosis not present

## 2017-12-12 DIAGNOSIS — R531 Weakness: Secondary | ICD-10-CM | POA: Diagnosis not present

## 2017-12-15 DIAGNOSIS — R260 Ataxic gait: Secondary | ICD-10-CM | POA: Diagnosis not present

## 2017-12-15 DIAGNOSIS — Z86718 Personal history of other venous thrombosis and embolism: Secondary | ICD-10-CM | POA: Diagnosis not present

## 2017-12-15 DIAGNOSIS — M6281 Muscle weakness (generalized): Secondary | ICD-10-CM | POA: Diagnosis not present

## 2017-12-15 DIAGNOSIS — Z9181 History of falling: Secondary | ICD-10-CM | POA: Diagnosis not present

## 2017-12-15 DIAGNOSIS — M810 Age-related osteoporosis without current pathological fracture: Secondary | ICD-10-CM | POA: Diagnosis not present

## 2017-12-15 DIAGNOSIS — Z7901 Long term (current) use of anticoagulants: Secondary | ICD-10-CM | POA: Diagnosis not present

## 2017-12-16 DIAGNOSIS — Z86718 Personal history of other venous thrombosis and embolism: Secondary | ICD-10-CM | POA: Diagnosis not present

## 2017-12-16 DIAGNOSIS — M810 Age-related osteoporosis without current pathological fracture: Secondary | ICD-10-CM | POA: Diagnosis not present

## 2017-12-16 DIAGNOSIS — R260 Ataxic gait: Secondary | ICD-10-CM | POA: Diagnosis not present

## 2017-12-16 DIAGNOSIS — M6281 Muscle weakness (generalized): Secondary | ICD-10-CM | POA: Diagnosis not present

## 2017-12-16 DIAGNOSIS — Z7901 Long term (current) use of anticoagulants: Secondary | ICD-10-CM | POA: Diagnosis not present

## 2017-12-16 DIAGNOSIS — Z9181 History of falling: Secondary | ICD-10-CM | POA: Diagnosis not present

## 2017-12-20 DIAGNOSIS — R260 Ataxic gait: Secondary | ICD-10-CM | POA: Diagnosis not present

## 2017-12-20 DIAGNOSIS — M6281 Muscle weakness (generalized): Secondary | ICD-10-CM | POA: Diagnosis not present

## 2017-12-20 DIAGNOSIS — M810 Age-related osteoporosis without current pathological fracture: Secondary | ICD-10-CM | POA: Diagnosis not present

## 2017-12-20 DIAGNOSIS — Z9181 History of falling: Secondary | ICD-10-CM | POA: Diagnosis not present

## 2017-12-20 DIAGNOSIS — Z86718 Personal history of other venous thrombosis and embolism: Secondary | ICD-10-CM | POA: Diagnosis not present

## 2017-12-20 DIAGNOSIS — Z7901 Long term (current) use of anticoagulants: Secondary | ICD-10-CM | POA: Diagnosis not present

## 2017-12-23 DIAGNOSIS — Z9181 History of falling: Secondary | ICD-10-CM | POA: Diagnosis not present

## 2017-12-23 DIAGNOSIS — Z7901 Long term (current) use of anticoagulants: Secondary | ICD-10-CM | POA: Diagnosis not present

## 2017-12-23 DIAGNOSIS — M6281 Muscle weakness (generalized): Secondary | ICD-10-CM | POA: Diagnosis not present

## 2017-12-23 DIAGNOSIS — Z86718 Personal history of other venous thrombosis and embolism: Secondary | ICD-10-CM | POA: Diagnosis not present

## 2017-12-23 DIAGNOSIS — M810 Age-related osteoporosis without current pathological fracture: Secondary | ICD-10-CM | POA: Diagnosis not present

## 2017-12-23 DIAGNOSIS — R260 Ataxic gait: Secondary | ICD-10-CM | POA: Diagnosis not present

## 2017-12-25 DIAGNOSIS — Z86718 Personal history of other venous thrombosis and embolism: Secondary | ICD-10-CM | POA: Diagnosis not present

## 2017-12-25 DIAGNOSIS — M6281 Muscle weakness (generalized): Secondary | ICD-10-CM | POA: Diagnosis not present

## 2017-12-25 DIAGNOSIS — Z9181 History of falling: Secondary | ICD-10-CM | POA: Diagnosis not present

## 2017-12-25 DIAGNOSIS — R260 Ataxic gait: Secondary | ICD-10-CM | POA: Diagnosis not present

## 2017-12-25 DIAGNOSIS — Z7901 Long term (current) use of anticoagulants: Secondary | ICD-10-CM | POA: Diagnosis not present

## 2017-12-25 DIAGNOSIS — M810 Age-related osteoporosis without current pathological fracture: Secondary | ICD-10-CM | POA: Diagnosis not present

## 2018-01-01 DIAGNOSIS — Z7901 Long term (current) use of anticoagulants: Secondary | ICD-10-CM | POA: Diagnosis not present

## 2018-01-01 DIAGNOSIS — M810 Age-related osteoporosis without current pathological fracture: Secondary | ICD-10-CM | POA: Diagnosis not present

## 2018-01-01 DIAGNOSIS — M6281 Muscle weakness (generalized): Secondary | ICD-10-CM | POA: Diagnosis not present

## 2018-01-01 DIAGNOSIS — Z9181 History of falling: Secondary | ICD-10-CM | POA: Diagnosis not present

## 2018-01-01 DIAGNOSIS — Z86718 Personal history of other venous thrombosis and embolism: Secondary | ICD-10-CM | POA: Diagnosis not present

## 2018-01-01 DIAGNOSIS — R260 Ataxic gait: Secondary | ICD-10-CM | POA: Diagnosis not present

## 2018-01-03 ENCOUNTER — Emergency Department (HOSPITAL_COMMUNITY)
Admission: EM | Admit: 2018-01-03 | Discharge: 2018-01-03 | Disposition: A | Discharge status: Home / Self Care | Payer: Medicare Other | Attending: Emergency Medicine | Admitting: Emergency Medicine

## 2018-01-03 ENCOUNTER — Other Ambulatory Visit: Payer: Self-pay

## 2018-01-03 ENCOUNTER — Emergency Department (HOSPITAL_COMMUNITY): Payer: Medicare Other

## 2018-01-03 ENCOUNTER — Encounter (HOSPITAL_COMMUNITY): Payer: Self-pay | Admitting: Radiology

## 2018-01-03 DIAGNOSIS — W1830XA Fall on same level, unspecified, initial encounter: Secondary | ICD-10-CM | POA: Insufficient documentation

## 2018-01-03 DIAGNOSIS — Z86718 Personal history of other venous thrombosis and embolism: Secondary | ICD-10-CM | POA: Diagnosis not present

## 2018-01-03 DIAGNOSIS — Y939 Activity, unspecified: Secondary | ICD-10-CM | POA: Diagnosis not present

## 2018-01-03 DIAGNOSIS — Z7901 Long term (current) use of anticoagulants: Secondary | ICD-10-CM | POA: Insufficient documentation

## 2018-01-03 DIAGNOSIS — W19XXXA Unspecified fall, initial encounter: Secondary | ICD-10-CM

## 2018-01-03 DIAGNOSIS — Z79899 Other long term (current) drug therapy: Secondary | ICD-10-CM | POA: Insufficient documentation

## 2018-01-03 DIAGNOSIS — S7001XA Contusion of right hip, initial encounter: Secondary | ICD-10-CM | POA: Diagnosis not present

## 2018-01-03 DIAGNOSIS — Y999 Unspecified external cause status: Secondary | ICD-10-CM | POA: Diagnosis not present

## 2018-01-03 DIAGNOSIS — S8001XA Contusion of right knee, initial encounter: Secondary | ICD-10-CM | POA: Diagnosis not present

## 2018-01-03 DIAGNOSIS — Y929 Unspecified place or not applicable: Secondary | ICD-10-CM | POA: Insufficient documentation

## 2018-01-03 DIAGNOSIS — M79606 Pain in leg, unspecified: Secondary | ICD-10-CM | POA: Diagnosis not present

## 2018-01-03 DIAGNOSIS — S79911A Unspecified injury of right hip, initial encounter: Secondary | ICD-10-CM | POA: Diagnosis not present

## 2018-01-03 DIAGNOSIS — R110 Nausea: Secondary | ICD-10-CM | POA: Diagnosis not present

## 2018-01-03 DIAGNOSIS — M25551 Pain in right hip: Secondary | ICD-10-CM | POA: Diagnosis not present

## 2018-01-03 DIAGNOSIS — S0990XA Unspecified injury of head, initial encounter: Secondary | ICD-10-CM | POA: Diagnosis not present

## 2018-01-03 DIAGNOSIS — I10 Essential (primary) hypertension: Secondary | ICD-10-CM | POA: Insufficient documentation

## 2018-01-03 DIAGNOSIS — Z043 Encounter for examination and observation following other accident: Secondary | ICD-10-CM | POA: Diagnosis present

## 2018-01-03 DIAGNOSIS — M25561 Pain in right knee: Secondary | ICD-10-CM | POA: Diagnosis not present

## 2018-01-03 LAB — CBC WITH DIFFERENTIAL/PLATELET
BASOS PCT: 0 %
Basophils Absolute: 0 10*3/uL (ref 0.0–0.1)
Eosinophils Absolute: 0 10*3/uL (ref 0.0–0.7)
Eosinophils Relative: 0 %
HEMATOCRIT: 38.6 % (ref 36.0–46.0)
Hemoglobin: 12.7 g/dL (ref 12.0–15.0)
Lymphocytes Relative: 29 %
Lymphs Abs: 1.3 10*3/uL (ref 0.7–4.0)
MCH: 30.8 pg (ref 26.0–34.0)
MCHC: 32.9 g/dL (ref 30.0–36.0)
MCV: 93.5 fL (ref 78.0–100.0)
MONO ABS: 0.6 10*3/uL (ref 0.1–1.0)
MONOS PCT: 14 %
NEUTROS ABS: 2.5 10*3/uL (ref 1.7–7.7)
Neutrophils Relative %: 57 %
Platelets: 122 10*3/uL — ABNORMAL LOW (ref 150–400)
RBC: 4.13 MIL/uL (ref 3.87–5.11)
RDW: 12.8 % (ref 11.5–15.5)
WBC: 4.4 10*3/uL (ref 4.0–10.5)

## 2018-01-03 LAB — COMPREHENSIVE METABOLIC PANEL
ALT: 21 U/L (ref 14–54)
AST: 31 U/L (ref 15–41)
Albumin: 3.6 g/dL (ref 3.5–5.0)
Alkaline Phosphatase: 41 U/L (ref 38–126)
Anion gap: 10 (ref 5–15)
BILIRUBIN TOTAL: 0.6 mg/dL (ref 0.3–1.2)
BUN: 11 mg/dL (ref 6–20)
CO2: 23 mmol/L (ref 22–32)
Calcium: 8.6 mg/dL — ABNORMAL LOW (ref 8.9–10.3)
Chloride: 104 mmol/L (ref 101–111)
Creatinine, Ser: 0.71 mg/dL (ref 0.44–1.00)
GFR calc non Af Amer: >60 mL/min (ref >60)
Glucose, Bld: 100 mg/dL — ABNORMAL HIGH (ref 65–99)
POTASSIUM: 3.6 mmol/L (ref 3.5–5.1)
Sodium: 137 mmol/L (ref 135–145)
TOTAL PROTEIN: 6.5 g/dL (ref 6.5–8.1)

## 2018-01-03 LAB — PROTIME-INR
INR: 1.28
PROTHROMBIN TIME: 15.9 s — AB (ref 11.4–15.2)

## 2018-01-03 LAB — URINALYSIS, ROUTINE W REFLEX MICROSCOPIC
BILIRUBIN URINE: NEGATIVE
Glucose, UA: NEGATIVE mg/dL
Ketones, ur: 20 mg/dL — AB
Leukocytes, UA: NEGATIVE
Nitrite: NEGATIVE
PH: 6 (ref 5.0–8.0)
Protein, ur: NEGATIVE mg/dL
SPECIFIC GRAVITY, URINE: 1.013 (ref 1.005–1.030)

## 2018-01-03 LAB — APTT: aPTT: 30 seconds (ref 24–36)

## 2018-01-03 NOTE — ED Notes (Signed)
Bed: AP01 Expected date:  Expected time:  Means of arrival:  Comments: 84 yo fall yesterday

## 2018-01-03 NOTE — ED Triage Notes (Addendum)
Per EMS, pt coming from home after experiencing a fall yesterday afternoon. Pt denies loc, dizziness, or hitting her head. Per EMS pt stated "my feet just gave out on me". Pt stated that she fell on her tailbone. Pt reports pain in her left knee related to the fall. EMS went to the home yesterday to assess the incident. Per EMS pt normally has swelling to bilateral lower extremities. Pt currently taking Eliquis for a blood clot in her left leg. Pt is AO x4 and per EMS ambulates with a cane. EMS was able to be standby assistance and ambulate pt to the ambulance.

## 2018-01-03 NOTE — Discharge Instructions (Signed)
Stop taking Eliquis.  Call and make an appointment to follow-up with your primary physician to discuss risk and benefits of continuing medication.  Return immediately for leg swelling, chest pain, shortness of breath, focal weakness, numbness, visual changes or for any concerns.

## 2018-01-03 NOTE — ED Notes (Signed)
Bed: JS47 Expected date:  Expected time:  Means of arrival:  Comments: 84yo FAll

## 2018-01-03 NOTE — ED Provider Notes (Signed)
[facility] DEPT Provider Note   CSN: [id number] Arrival date & time: 01/03/18  0915     History   Chief Complaint Chief Complaint  Patient presents with  . Fall    HPI Tonya Russell is a 84 y.o. female.  HPI Patient states that yesterday she had a ground-level fall.  Unable to fully describe the fall.  Does not believe that she hit her ahead.  She had no loss of consciousness.  She has ambulated with assistance since the fall but complains of right-sided hip/buttocks pain.  Denies focal weakness or numbness.  States that when she tries to ambulate by herself she becomes "shaky".  Had some nausea this morning but denies any vomiting.  States she has chronic urinary frequency but denies dysuria.  No shortness of breath or chest pain.  Denies headache, visual changes, speech changes.  Patient is on Eliquis for a left lower extremity DVT. Past Medical History:  Diagnosis Date  . Abdominal pain, epigastric   . Allergic rhinitis due to other allergen   . Allergic rhinitis, cause unspecified   . Dermatophytosis of nail   . Diffuse cystic mastopathy   . Diverticulosis of colon (without mention of hemorrhage)   . Dysphagia, unspecified(787.20)   . Esophageal reflux   . Family history of malignant neoplasm of gastrointestinal tract   . Hiatal hernia   . Hyperlipidemia   . Hypertension   . Internal hemorrhoids without mention of complication   . 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 Obesity   . Oral aphthae   . Osteoarthrosis, unspecified whether generalized or localized, unspecified site   . Other abnormal blood chemistry   . Other voice and resonance disorders   . Pain in joint, pelvic region and thigh   . Palpitations   . Pancreatitis   . Peripheral vascular disease, unspecified ([place])   . Schatzki's ring   . Senile osteoporosis   . Torus fracture of fibula   . Unspecified constipation   . Unspecified hypothyroidism     Patient  Active Problem List   Diagnosis Date Noted  . Incontinence of urine in female 07/13/2017  . Pain in left knee 01/02/2017  . Dizzy 10/12/2015  . Corn of foot 12/16/2013  . Hypothyroidism 01/26/2013  . Hyperglycemia   . Hyperlipidemia   . Hypertension   . 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 Peripheral vascular disease, unspecified ([place])   . Mildly obese 11/01/2008  . ESOPHAGEAL MOTILITY DISORDER 11/01/2008  . Dysphagia 11/01/2008  . Non-toxic nodular goiter 10/13/2008  . ANXIETY 10/13/2008  . Coronary atherosclerosis 10/13/2008  . Internal hemorrhoids 10/13/2008  . DIVERTICULOSIS, SIGMOID COLON 10/13/2008  . Chronic pancreatitis ([place]) 10/13/2008  . GERD 09/25/2005    Past Surgical History:  Procedure Laterality Date  . APPENDECTOMY    . BASAL CELL CARCINOMA EXCISION     right humerus, right temple, left side of nose  . BASAL CELL CARCINOMA EXCISION     chest, left cheek  . CATARACT EXTRACTION, BILATERAL    . CHOLECYSTECTOMY    . keratosis     left arm  . resection of ampullary tumor    . squamous cell cancer     Right leg  . squamous cell cancer left triceps Left    Dr. Lorenza Cambridge  . TONSILLECTOMY AND ADENOIDECTOMY       OB History   None      Home Medications  Prior to Admission medications   Medication Sig Start Date End Date Taking? Authorizing Provider  acetaminophen (TYLENOL) 500 MG tablet Take 500 mg by mouth every 6 (six) hours as needed for mild pain or moderate pain.   Yes [provider]  alendronate (FOSAMAX) 70 MG tablet Take 1 tablet (70 mg total) by mouth every 7 (seven) days. Once a Week. Take with a full glass of water on an empty stomach. 06/03/17  Yes Reed, Tiffany L, DO  bimatoprost (LUMIGAN) 0.01 % SOLN Place 1 drop into both eyes at bedtime.   Yes [provider]  DULoxetine (CYMBALTA) 30 MG capsule Take 1 capsule (30 mg total) by mouth daily. 06/03/17  Yes Reed, Tiffany L, DO  fluticasone (FLONASE) 50 MCG/ACT  nasal spray Place 1 spray into both nostrils daily as needed.    Yes [provider]  gabapentin (NEURONTIN) 300 MG capsule Take 1 capsule (300 mg total) by mouth at bedtime. 11/11/17  Yes Reed, Tiffany L, DO  Inulin (FIBER CHOICE PO) Take 5 g by mouth 2 (two) times daily.   Yes [provider]  levothyroxine (SYNTHROID, LEVOTHROID) 50 MCG tablet TAKE 1 TABLET DAILY FOR THYROID SUPPLEMENT 12/05/17  Yes Reed, Tiffany L, DO  Menthol, Topical Analgesic, (BIOFREEZE) 4 % GEL Apply 1 application topically daily as needed (Knee pain).   Yes [provider]  Multiple Vitamin (MULTIVITAMIN WITH MINERALS) TABS tablet Take 1 tablet by mouth daily.   Yes [provider]  Omega-3 Fatty Acids (FISH OIL) 1200 MG CAPS Take 1,200 mg by mouth 3 (three) times daily.    Yes [provider]  omeprazole (PRILOSEC) 20 MG capsule TAKE 1 CAPSULE DAILY TO REDUCE STOMACH ACID. Patient taking differently: take 20mg  daily 07/03/17  Yes Reed, Tiffany L, DO  Polyethyl Glycol-Propyl Glycol (SYSTANE ULTRA OP) Apply 1 drop to eye as needed. Both eyes for dry eyes   Yes [provider]  UNABLE TO FIND Take 1 tablet daily by mouth. Bone Care: Calcium 1000mg , magnesium, vitamins D3 2000 units and K: Take one tablet by mouth every evening. 07/23/17  Yes Lauree Chandler, NP    Family History Family History  Problem Relation Age of Onset  . Cancer Mother        spindle cell carcinoma on neck  . Colon cancer Father 73  . Breast cancer Sister   . Colon polyps Sister   . Heart disease Brother   . Breast cancer Unknown        neiece x 3  . Esophageal cancer Neg Hx   . Pancreatic cancer Neg Hx   . Kidney disease Neg Hx   . Liver disease Neg Hx     Social History Social History   Tobacco Use  . Smoking status: Never Smoker  . Smokeless tobacco: Never Used  Substance Use Topics  . Alcohol use: No  . Drug use: No     Allergies   Codeine; Lodine [etodolac]; and  Penicillins   Review of Systems Review of Systems  Constitutional: Negative for chills, fatigue and fever.  HENT: Negative for facial swelling, trouble swallowing and voice change.   Eyes: Negative for visual disturbance.  Respiratory: Negative for shortness of breath.   Cardiovascular: Negative for chest pain.  Gastrointestinal: Positive for nausea. Negative for abdominal pain, diarrhea and vomiting.  Genitourinary: Positive for frequency. Negative for dysuria and flank pain.  Musculoskeletal: Positive for arthralgias, gait problem and myalgias. Negative for back pain and neck  pain.  Skin: Negative for rash and wound.  Neurological: Negative for dizziness, weakness, light-headedness, numbness and headaches.  Psychiatric/Behavioral: The patient is nervous/anxious.   All other systems reviewed and are negative.    Physical Exam Updated Vital Signs BP (!) 137/58   Pulse 75   Temp 98.1 F (36.7 C) (Oral)   Resp 15   SpO2 93%   Physical Exam  Constitutional: She is oriented to person, place, and time. She appears well-developed and well-nourished.  HENT:  Head: Normocephalic and atraumatic.  Mouth/Throat: Oropharynx is clear and moist.  No obvious scalp trauma.  Dry lips and mucous membranes.  Eyes: Pupils are equal, round, and reactive to light. EOM are normal.  Neck: Normal range of motion. Neck supple.  No posterior midline cervical tenderness to palpation.  Cardiovascular: Normal rate and regular rhythm. Exam reveals no gallop and no friction rub.  No murmur heard. Pulmonary/Chest: Effort normal and breath sounds normal. No stridor. No respiratory distress. She has no wheezes. She has no rales. She exhibits no tenderness.  Abdominal: Soft. Bowel sounds are normal. There is no tenderness. There is no rebound and no guarding.  Musculoskeletal: Normal range of motion. She exhibits tenderness. She exhibits no edema.  Patient has mild tenderness over the right buttock.  No  obvious contusion or hematoma.  Mildly limited range of motion to the right hip.  Pelvis is stable.  Patient has small effusion to the right knee.  She has some tenderness over the right knee laterally.  No ligamentous instability.  No midline thoracic or lumbar tenderness.  Neurological: She is alert and oriented to person, place, and time.  5/5 bilateral upper extremity motor.  Strength.  4/5 bilateral lower extremity strength.  Sensation light touch grossly intact.  Skin: Skin is warm and dry. Capillary refill takes less than 2 seconds. No rash noted. No erythema.  Psychiatric:  Mildly anxious appearing  Nursing note and vitals reviewed.    ED Treatments / Results  Labs (all labs ordered are listed, but only abnormal results are displayed) Labs Reviewed  CBC WITH DIFFERENTIAL/PLATELET - Abnormal; Notable for the following components:      Result Value   Platelets 122 (*)    All other components within normal limits  COMPREHENSIVE METABOLIC PANEL - Abnormal; Notable for the following components:   Glucose, Bld 100 (*)    Calcium 8.6 (*)    All other components within normal limits  PROTIME-INR - Abnormal; Notable for the following components:   Prothrombin Time 15.9 (*)    All other components within normal limits  URINALYSIS, ROUTINE W REFLEX MICROSCOPIC - Abnormal; Notable for the following components:   Hgb urine dipstick SMALL (*)    Ketones, ur 20 (*)    Bacteria, UA RARE (*)    All other components within normal limits  APTT    EKG EKG Interpretation  Date/Time:  Friday January 03 2018 09:49:13 EDT Ventricular Rate:  72 PR Interval:    QRS Duration: 102 QT Interval:  410 QTC Calculation: 449 R Axis:   31 Text Interpretation:  Sinus rhythm Confirmed by Julianne Rice [phone]) on 01/03/2018 1:11:13 PM   Radiology Ct Head Wo Contrast  Result Date: 01/03/2018 CLINICAL DATA:  Fall yesterday.  No loss of consciousness. EXAM: CT HEAD WITHOUT CONTRAST TECHNIQUE:  Contiguous axial images were obtained from the base of the skull through the vertex without intravenous contrast. COMPARISON:  CT scan of October 28, 2015. FINDINGS: Brain: Mild chronic ischemic  white matter disease. Mild diffuse cortical atrophy. No mass effect or midline shift is noted. Ventricular size is within normal limits. There is no evidence of mass lesion, hemorrhage or acute infarction. Vascular: No hyperdense vessel or unexpected calcification. Skull: Normal. Negative for fracture or focal lesion. Sinuses/Orbits: No acute finding. Other: None. IMPRESSION: Mild chronic ischemic white matter disease. Mild diffuse cortical atrophy. No acute intracranial abnormality seen. Electronically Signed   By: Marijo Conception, M.D.   On: 01/03/2018 10:43   Dg Knee Complete 4 Views Right  Result Date: 01/03/2018 CLINICAL DATA:  Fall.  Right knee pain. EXAM: RIGHT KNEE - COMPLETE 4+ VIEW COMPARISON:  None. FINDINGS: Mild-to-moderate degenerative changes in all 3 compartments of the right knee, most pronounced in the patellofemoral compartment with joint space narrowing and spurring. No acute bony abnormality. Specifically, no fracture, subluxation, or dislocation. IMPRESSION: Degenerative changes in the right knee.  No acute bony abnormality. Electronically Signed   By: Rolm Baptise M.D.   On: 01/03/2018 10:32   Dg Hip Unilat W Or Wo Pelvis 2-3 Views Right  Result Date: 01/03/2018 CLINICAL DATA:  Fall onto tailbone.  Right hip and buttock pain. EXAM: DG HIP (WITH OR WITHOUT PELVIS) 2-3V RIGHT COMPARISON:  None. FINDINGS: Degenerative changes with joint space narrowing and spurring in the hip joints bilaterally. No acute bony abnormality. Specifically, no fracture, subluxation, or dislocation. IMPRESSION: Symmetric degenerative changes in the hips. No acute bony abnormality. Electronically Signed   By: Rolm Baptise M.D.   On: 01/03/2018 10:31    Procedures Procedures (including critical care  time)  Medications Ordered in ED Medications - No data to display   Initial Impression / Assessment and Plan / ED Course  I have reviewed the triage vital signs and the nursing notes.  Pertinent labs & imaging results that were available during my care of the patient were reviewed by me and considered in my medical decision making (see chart for details).     Diagnosed with left lower extremity DVT 11/18.  She is been on Eliquis now for roughly 5 months.  She has unsteady gait and walks with a walker.  She seen here after fall.  No acute abnormality on CT head.  Believe patient is a fall risk and risk of continuing Eliquis outweigh benefits.  Have advised patient to stop taking the Eliquis until she can follow-up with her primary physician.  Strict return precautions have been given.  Final Clinical Impressions(s) / ED Diagnoses   Final diagnoses:  Fall, initial encounter  Contusion of right knee, initial encounter  Contusion of right hip, initial encounter    ED Discharge Orders    None       Julianne Rice, MD 01/03/18 1311

## 2018-01-03 NOTE — ED Notes (Signed)
Pt ambulated with walker.

## 2018-01-07 ENCOUNTER — Encounter: Payer: Self-pay | Admitting: Nurse Practitioner

## 2018-01-07 ENCOUNTER — Ambulatory Visit (INDEPENDENT_AMBULATORY_CARE_PROVIDER_SITE_OTHER): Payer: Medicare Other | Admitting: Nurse Practitioner

## 2018-01-07 ENCOUNTER — Ambulatory Visit: Payer: Medicare Other | Admitting: Nurse Practitioner

## 2018-01-07 VITALS — BP 124/76 | HR 68 | Temp 98.9°F | Wt 173.6 lb

## 2018-01-07 DIAGNOSIS — I82442 Acute embolism and thrombosis of left tibial vein: Secondary | ICD-10-CM | POA: Diagnosis not present

## 2018-01-07 DIAGNOSIS — R296 Repeated falls: Secondary | ICD-10-CM

## 2018-01-07 DIAGNOSIS — R05 Cough: Secondary | ICD-10-CM | POA: Diagnosis not present

## 2018-01-07 DIAGNOSIS — R5381 Other malaise: Secondary | ICD-10-CM | POA: Diagnosis not present

## 2018-01-07 DIAGNOSIS — R413 Other amnesia: Secondary | ICD-10-CM

## 2018-01-07 DIAGNOSIS — R059 Cough, unspecified: Secondary | ICD-10-CM

## 2018-01-07 NOTE — Patient Instructions (Addendum)
Work on [employer] trader POA We will contact home health about getting increase in physical therapy We will contact Mechele Claude in regards to helping set up in home help  Keep follow up with Dr Mariea Clonts, follow up sooner if needed

## 2018-01-07 NOTE — Progress Notes (Signed)
Careteam: Patient Care Team: Gayland Curry, DO as PCP - General (Geriatric Medicine) [name], MD as Consulting Physician (Gastroenterology) Stanford Breed Denice Bors, MD as Consulting Physician (Cardiology) Druscilla Brownie, MD as Consulting Physician (Dermatology) [name], MD as Consulting Physician (Ophthalmology)  Advanced Directive information Does Patient Have a Medical Advance Directive?: Yes, Type of Advance Directive: [place], Pre-existing out of facility DNR order (yellow form or pink MOST form): Yellow form placed in chart (order not valid for inpatient use)  Allergies  Allergen Reactions  . Codeine Nausea And Vomiting  . Lodine [Etodolac] Nausea And Vomiting  . Penicillins     Has patient had a PCN reaction causing immediate rash, facial/tongue/throat swelling, SOB or lightheadedness with hypotension: yes Has patient had a PCN reaction causing severe rash involving mucus membranes or skin necrosis: no Has patient had a PCN reaction that required hospitalization: no Has patient had a PCN reaction occurring within the last 10 years: no If all of the above answers are "NO", then may proceed with Cephalosporin use.    Chief Complaint  Patient presents with  . Follow-up    Pt is being seen for a ED follow up. Pt went to [facility] on 01/03/18 due to fall. Pt has had several falls over the last week but unsure of cause or number of falls.   Tonya Russell, in room     HPI: Patient is a 84 y.o. female seen in the office today to follow up ED visit.  Pt went to ED due to fall, pt has had hx of multiple falls in the past due to being unsteady. She was previously on eliquis but due to frequency of falls was advised by ED to stop due to risk vs benefits at this time.  Pt with hx of chronic left knee pain followed by ortho where he is receiving injections due to arthritis and spurs. Home health referral was placed at last OV (11/11/17) Pt reports  she called life alert 1-2 times on 01/02/18 and then called 911 on 01/03/18 after her neighbor checked on her and could not get her out of the bed.  Said that when they went to check on her she was in the bed and could not get up. Also noted to be congested and wheezing. Friend who is with her today reports more confusion.   Today she is here in a wheel chair. Reports her legs are giving out. Increased weakness.  Has been working with PT which she feels like has been benefiting her.  Has a walker and cane to use at home. Sometimes uses cane but has been recommended to use walker.  Review of Systems:  Review of Systems  Constitutional: Negative for chills, fever and malaise/fatigue.  Respiratory: Positive for cough. Negative for sputum production, shortness of breath and wheezing.   Cardiovascular: Negative for chest pain and palpitations.  Musculoskeletal: Positive for falls, joint pain and myalgias.  Psychiatric/Behavioral: Positive for memory loss.    Past Medical History:  Diagnosis Date  . Abdominal pain, epigastric   . Allergic rhinitis due to other allergen   . Allergic rhinitis, cause unspecified   . Dermatophytosis of nail   . Diffuse cystic mastopathy   . Diverticulosis of colon (without mention of hemorrhage)   . Dysphagia, unspecified(787.20)   . Esophageal reflux   . Family history of malignant neoplasm of gastrointestinal tract   . Hiatal hernia   . Hyperlipidemia   .  Hypertension   . Internal hemorrhoids without mention of complication   . 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 Obesity   . Oral aphthae   . Osteoarthrosis, unspecified whether generalized or localized, unspecified site   . Other abnormal blood chemistry   . Other voice and resonance disorders   . Pain in joint, pelvic region and thigh   . Palpitations   . Pancreatitis   . Peripheral vascular disease, unspecified ([place])   . Schatzki's ring   . Senile osteoporosis   . Torus fracture of  fibula   . Unspecified constipation   . Unspecified hypothyroidism    Past Surgical History:  Procedure Laterality Date  . APPENDECTOMY    . BASAL CELL CARCINOMA EXCISION     right humerus, right temple, left side of nose  . BASAL CELL CARCINOMA EXCISION     chest, left cheek  . CATARACT EXTRACTION, BILATERAL    . CHOLECYSTECTOMY    . keratosis     left arm  . resection of ampullary tumor    . squamous cell cancer     Right leg  . squamous cell cancer left triceps Left    Dr. Lorenza Cambridge  . TONSILLECTOMY AND ADENOIDECTOMY     Social History:   reports that she has never smoked. She has never used smokeless tobacco. She reports that she does not drink alcohol or use drugs.  Family History  Problem Relation Age of Onset  . Cancer Mother        spindle cell carcinoma on neck  . Colon cancer Father 60  . Breast cancer Sister   . Colon polyps Sister   . Heart disease Brother   . Breast cancer Unknown        neiece x 3  . Esophageal cancer Neg Hx   . Pancreatic cancer Neg Hx   . Kidney disease Neg Hx   . Liver disease Neg Hx     Medications: Patient's Medications  New Prescriptions   No medications on file  Previous Medications   ACETAMINOPHEN (TYLENOL) 500 MG TABLET    Take 500 mg by mouth every 6 (six) hours as needed for mild pain or moderate pain.   ALENDRONATE (FOSAMAX) 70 MG TABLET    Take 1 tablet (70 mg total) by mouth every 7 (seven) days. Once a Week. Take with a full glass of water on an empty stomach.   BIMATOPROST (LUMIGAN) 0.01 % SOLN    Place 1 drop into both eyes at bedtime.   DULOXETINE (CYMBALTA) 30 MG CAPSULE    Take 1 capsule (30 mg total) by mouth daily.   FLUTICASONE (FLONASE) 50 MCG/ACT NASAL SPRAY    Place 1 spray into both nostrils daily as needed.    GABAPENTIN (NEURONTIN) 300 MG CAPSULE    Take 1 capsule (300 mg total) by mouth at bedtime.   INULIN (FIBER CHOICE PO)    Take 5 g by mouth 2 (two) times daily.   LEVOTHYROXINE (SYNTHROID,  LEVOTHROID) 50 MCG TABLET    TAKE 1 TABLET DAILY FOR THYROID SUPPLEMENT   MENTHOL, TOPICAL ANALGESIC, (BIOFREEZE) 4 % GEL    Apply 1 application topically daily as needed (Knee pain).   MULTIPLE VITAMIN (MULTIVITAMIN WITH MINERALS) TABS TABLET    Take 1 tablet by mouth daily.   OMEGA-3 FATTY ACIDS (FISH OIL) 1200 MG CAPS    Take 1,200 mg by mouth 3 (three) times daily.    OMEPRAZOLE (PRILOSEC) 20  MG CAPSULE    TAKE 1 CAPSULE DAILY TO REDUCE STOMACH ACID.   POLYETHYL GLYCOL-PROPYL GLYCOL (SYSTANE ULTRA OP)    Apply 1 drop to eye as needed. Both eyes for dry eyes   UNABLE TO FIND    Take 1 tablet daily by mouth. Bone Care: Calcium 1000mg , magnesium, vitamins D3 2000 units and K: Take one tablet by mouth every evening.  Modified Medications   No medications on file  Discontinued Medications   No medications on file     Physical Exam:  Vitals:   01/07/18 0958  BP: 124/76  Pulse: 68  Temp: 98.9 F (37.2 C)  TempSrc: Oral  SpO2: 97%   There is no height or weight on file to calculate BMI.  Physical Exam  Constitutional: She is oriented to person, place, and time. She appears well-developed and well-nourished.  HENT:  Head: Normocephalic and atraumatic.  Cardiovascular: Normal rate, regular rhythm, normal heart sounds and intact distal pulses.  Pulmonary/Chest: Effort normal and breath sounds normal.  Abdominal: Soft. Bowel sounds are normal.  Musculoskeletal: She exhibits no edema.  In wheelchair today, moves slowly  Neurological: She is alert and oriented to person, place, and time.  Skin: Skin is warm and dry. Capillary refill takes less than 2 seconds.  Psychiatric: She has a normal mood and affect. Her behavior is normal. Thought content normal. Cognition and memory are impaired. She exhibits abnormal recent memory.    Labs reviewed: Basic Metabolic Panel: Recent Labs    02/26/17 1230 07/01/17 1035 11/11/17 1244 01/03/18 1043  NA 142 138 142 137  K 4.0 3.9 4.2 3.6  CL  106 102 104 104  CO2 26 32 31 23  GLUCOSE 110* 107* 99 100*  BUN 15 12 13 11   CREATININE 0.71 0.66 0.73 0.71  CALCIUM 9.4 9.0 9.1 8.6*  TSH 1.24  --   --   --    Liver Function Tests: Recent Labs    02/26/17 1230 07/01/17 1035 01/03/18 1043  AST 21 18 31   ALT 14 15 21   ALKPHOS 42  --  41  BILITOT 0.7 0.6 0.6  PROT 6.8 6.4 6.5  ALBUMIN 4.2  --  3.6   No results for input(s): LIPASE, AMYLASE in the last 8760 hours. No results for input(s): AMMONIA in the last 8760 hours. CBC: Recent Labs    02/26/17 1230 11/11/17 1244 01/03/18 1043  WBC 6.1 6.2 4.4  NEUTROABS 3,538 3,509 2.5  HGB 12.7 12.7 12.7  HCT 38.1 36.8 38.6  MCV 94.3 90.4 93.5  PLT 153 192 122*   Lipid Panel: Recent Labs    07/01/17 1035  CHOL 265*  HDL 93  LDLCALC 154*  TRIG 75  CHOLHDL 2.8   TSH: Recent Labs    02/26/17 1230  TSH 1.24   A1C: Lab Results  Component Value Date   HGBA1C 5.3 07/01/2017     Assessment/Plan 1. Memory loss Friend here today with her who helps her with transportation to all appts concerned over gradual memory loss, CT head done in ED and this was discussed with pt today. She does not have a Healthcare POA at this time but her sister is her financial POA and feels like she would be the one to make decisions for her if she want not able to. Needing increase assistance in the home as she is having a harder time with mobility and increase in falls.  Appears to also have increased confusion with medication, will have  home health nursing to evaluate and do medication review in the home at this time.  - Ambulatory referral to [facility]  2. Debility Continues to work with PT however still with progressive weakness and now with another fall. Having a harder time with house work and needing more assistance in home however she is hesitant to ask for help. On the night before ED visit her neighbor had to assist her from chair to bed and help her get ready for bed -  Ambulatory referral to [facility]  3. Multiple falls Hx of recurrent falls but appears to have only had one fall in the last week. Pt reports she does not remember details and friend is unsure of actually what happened but knows that her life alert was hit twice and 911 called the next day. She continues to wear life alert and has people frequently checking on her.  -denies pain from fall but reports chronic knee pain, taking tylenol PRN - Ambulatory referral to [facility]  4. Acute deep vein thrombosis (DVT) of tibial vein of left lower extremity (HCC) Eliquis stopped in ED due to multiple falls. she has completed 5 months of therapy and risk due to multiple falls outweigh benefit at this time.   5. Cough Improving at this time. Denies congestion or shortness of breath. Encouraged proper hydration.   Next appt: 02/06/2018, sooner if needed  Tonya Russell. [place] [phone]

## 2018-01-08 DIAGNOSIS — R260 Ataxic gait: Secondary | ICD-10-CM | POA: Diagnosis not present

## 2018-01-08 DIAGNOSIS — M6281 Muscle weakness (generalized): Secondary | ICD-10-CM | POA: Diagnosis not present

## 2018-01-08 DIAGNOSIS — Z7901 Long term (current) use of anticoagulants: Secondary | ICD-10-CM | POA: Diagnosis not present

## 2018-01-08 DIAGNOSIS — Z86718 Personal history of other venous thrombosis and embolism: Secondary | ICD-10-CM | POA: Diagnosis not present

## 2018-01-08 DIAGNOSIS — M810 Age-related osteoporosis without current pathological fracture: Secondary | ICD-10-CM | POA: Diagnosis not present

## 2018-01-08 DIAGNOSIS — Z9181 History of falling: Secondary | ICD-10-CM | POA: Diagnosis not present

## 2018-01-10 ENCOUNTER — Encounter (HOSPITAL_COMMUNITY): Payer: Self-pay

## 2018-01-10 ENCOUNTER — Emergency Department (HOSPITAL_BASED_OUTPATIENT_CLINIC_OR_DEPARTMENT_OTHER)
Admit: 2018-01-10 | Discharge: 2018-01-10 | Disposition: A | Discharge status: Home / Self Care | Payer: Medicare Other | Attending: Emergency Medicine | Admitting: Emergency Medicine

## 2018-01-10 ENCOUNTER — Telehealth: Payer: Self-pay | Admitting: *Deleted

## 2018-01-10 ENCOUNTER — Other Ambulatory Visit: Payer: Self-pay

## 2018-01-10 ENCOUNTER — Emergency Department (HOSPITAL_COMMUNITY)
Admission: EM | Admit: 2018-01-10 | Discharge: 2018-01-10 | Disposition: A | Discharge status: Home / Self Care | Payer: Medicare Other | Attending: Emergency Medicine | Admitting: Emergency Medicine

## 2018-01-10 DIAGNOSIS — R260 Ataxic gait: Secondary | ICD-10-CM | POA: Diagnosis not present

## 2018-01-10 DIAGNOSIS — Z7901 Long term (current) use of anticoagulants: Secondary | ICD-10-CM | POA: Diagnosis not present

## 2018-01-10 DIAGNOSIS — M79609 Pain in unspecified limb: Secondary | ICD-10-CM

## 2018-01-10 DIAGNOSIS — I10 Essential (primary) hypertension: Secondary | ICD-10-CM | POA: Insufficient documentation

## 2018-01-10 DIAGNOSIS — Z9181 History of falling: Secondary | ICD-10-CM | POA: Diagnosis not present

## 2018-01-10 DIAGNOSIS — R2242 Localized swelling, mass and lump, left lower limb: Secondary | ICD-10-CM | POA: Diagnosis present

## 2018-01-10 DIAGNOSIS — E039 Hypothyroidism, unspecified: Secondary | ICD-10-CM | POA: Diagnosis not present

## 2018-01-10 DIAGNOSIS — I82502 Chronic embolism and thrombosis of unspecified deep veins of left lower extremity: Secondary | ICD-10-CM | POA: Diagnosis not present

## 2018-01-10 DIAGNOSIS — I82442 Acute embolism and thrombosis of left tibial vein: Secondary | ICD-10-CM | POA: Diagnosis not present

## 2018-01-10 DIAGNOSIS — M7989 Other specified soft tissue disorders: Secondary | ICD-10-CM

## 2018-01-10 DIAGNOSIS — M810 Age-related osteoporosis without current pathological fracture: Secondary | ICD-10-CM | POA: Diagnosis not present

## 2018-01-10 DIAGNOSIS — Z79899 Other long term (current) drug therapy: Secondary | ICD-10-CM | POA: Insufficient documentation

## 2018-01-10 DIAGNOSIS — Z86718 Personal history of other venous thrombosis and embolism: Secondary | ICD-10-CM | POA: Diagnosis not present

## 2018-01-10 DIAGNOSIS — M6281 Muscle weakness (generalized): Secondary | ICD-10-CM | POA: Diagnosis not present

## 2018-01-10 HISTORY — DX: Acute embolism and thrombosis of unspecified deep veins of unspecified lower extremity: I82.409

## 2018-01-10 LAB — CBC WITH DIFFERENTIAL/PLATELET
BASOS ABS: 0 10*3/uL (ref 0.0–0.1)
Basophils Relative: 0 %
Eosinophils Absolute: 0.1 10*3/uL (ref 0.0–0.7)
Eosinophils Relative: 1 %
HEMATOCRIT: 37.1 % (ref 36.0–46.0)
Hemoglobin: 12.2 g/dL (ref 12.0–15.0)
LYMPHS ABS: 2.1 10*3/uL (ref 0.7–4.0)
Lymphocytes Relative: 46 %
MCH: 30.3 pg (ref 26.0–34.0)
MCHC: 32.9 g/dL (ref 30.0–36.0)
MCV: 92.1 fL (ref 78.0–100.0)
MONO ABS: 0.4 10*3/uL (ref 0.1–1.0)
Monocytes Relative: 8 %
NEUTROS ABS: 2 10*3/uL (ref 1.7–7.7)
Neutrophils Relative %: 45 %
Platelets: 179 10*3/uL (ref 150–400)
RBC: 4.03 MIL/uL (ref 3.87–5.11)
RDW: 12.6 % (ref 11.5–15.5)
WBC: 4.5 10*3/uL (ref 4.0–10.5)

## 2018-01-10 LAB — BASIC METABOLIC PANEL
ANION GAP: 8 (ref 5–15)
BUN: 9 mg/dL (ref 6–20)
CO2: 30 mmol/L (ref 22–32)
Calcium: 9 mg/dL (ref 8.9–10.3)
Chloride: 103 mmol/L (ref 101–111)
Creatinine, Ser: 0.68 mg/dL (ref 0.44–1.00)
GFR calc Af Amer: >60 mL/min (ref >60)
GFR calc non Af Amer: >60 mL/min (ref >60)
Glucose, Bld: 112 mg/dL — ABNORMAL HIGH (ref 65–99)
POTASSIUM: 3.1 mmol/L — AB (ref 3.5–5.1)
Sodium: 141 mmol/L (ref 135–145)

## 2018-01-10 NOTE — ED Notes (Signed)
Patient Alert and oriented to baseline. Stable and ambulatory to baseline. Patient verbalized understanding of the discharge instructions.  Patient belongings were taken by the patient.   

## 2018-01-10 NOTE — ED Notes (Signed)
Bakers cysts and chronic DVT noted, nothing acute, results received from [place] tech

## 2018-01-10 NOTE — Telephone Encounter (Signed)
Tonya Russell with [facility] called and stated that she was with patient this morning for PT and patient is complaining of leg pain. Nurse stated that Left Leg above the ankle is swollen and painful to touch. No warmth or redness. ER had stopped patient's Eliquis and with patient's history of blood clots the nurse encouraged patient to go to the Urgent Care to be evaluated. Agreed.

## 2018-01-10 NOTE — Discharge Instructions (Addendum)
Your ultrasound today showed an old blood clot which is in the same location as you are prior blood clot.  There is not current indication to restart your blood thinner, but we advise follow-up with your primary care doctor should you have any additional concerns regarding this plan.  Continue with Tylenol or ibuprofen for discomfort.  Return to the emergency department for new or concerning symptoms.

## 2018-01-10 NOTE — ED Triage Notes (Signed)
Pt endorses left leg swelling/pain around the calf area. Pt evaluated by PT at home this morning and PT told her there was some new swelling to left lower leg and pressed on it and the pt had extreme pain which is new. Pt had recent blood clot and completed eliquis therapy this past week. Good pedal pulse.

## 2018-01-10 NOTE — ED Provider Notes (Signed)
[facility] EMERGENCY DEPARTMENT Provider Note   CSN: [id number] Arrival date & time: 01/10/18  1402    History   Chief Complaint Chief Complaint  Patient presents with  . Leg Swelling    HPI Tonya Russell is a 84 y.o. female.  84 year old female with a history of DVT, recently discontinued Eliquis, presents to the emergency department at the advice of her home PT provider.  She reports some pain and swelling in her left lower extremity which is aggravated with ambulation.  She has not taken any medications for her symptoms today.  She has had similar pain in her calf and knee for a while, but PT felt it was slightly worse today.  She was told to come to the emergency department for evaluation of a new DVT.  No associated numbness or paresthesias to the affected extremity.  No new trauma or fall since last evaluated in the Emergency Department.     Past Medical History:  Diagnosis Date  . Abdominal pain, epigastric   . Allergic rhinitis due to other allergen   . Allergic rhinitis, cause unspecified   . Dermatophytosis of nail   . Diffuse cystic mastopathy   . Diverticulosis of colon (without mention of hemorrhage)   . DVT (deep venous thrombosis) ([place])   . Dysphagia, unspecified(787.20)   . Esophageal reflux   . Family history of malignant neoplasm of gastrointestinal tract   . Hiatal hernia   . Hyperlipidemia   . Hypertension   . Internal hemorrhoids without mention of complication   . 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 Obesity   . Oral aphthae   . Osteoarthrosis, unspecified whether generalized or localized, unspecified site   . Other abnormal blood chemistry   . Other voice and resonance disorders   . Pain in joint, pelvic region and thigh   . Palpitations   . Pancreatitis   . Peripheral vascular disease, unspecified ([place])   . Schatzki's ring   . Senile osteoporosis   . Torus fracture of fibula   . Unspecified constipation   .  Unspecified hypothyroidism     Patient Active Problem List   Diagnosis Date Noted  . Incontinence of urine in female 07/13/2017  . Pain in left knee 01/02/2017  . Dizzy 10/12/2015  . Corn of foot 12/16/2013  . Hypothyroidism 01/26/2013  . Hyperglycemia   . Hyperlipidemia   . Hypertension   . 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 Peripheral vascular disease, unspecified ([place])   . Mildly obese 11/01/2008  . ESOPHAGEAL MOTILITY DISORDER 11/01/2008  . Dysphagia 11/01/2008  . Non-toxic nodular goiter 10/13/2008  . ANXIETY 10/13/2008  . Coronary atherosclerosis 10/13/2008  . Internal hemorrhoids 10/13/2008  . DIVERTICULOSIS, SIGMOID COLON 10/13/2008  . Chronic pancreatitis ([place]) 10/13/2008  . GERD 09/25/2005    Past Surgical History:  Procedure Laterality Date  . APPENDECTOMY    . BASAL CELL CARCINOMA EXCISION     right humerus, right temple, left side of nose  . BASAL CELL CARCINOMA EXCISION     chest, left cheek  . CATARACT EXTRACTION, BILATERAL    . CHOLECYSTECTOMY    . keratosis     left arm  . resection of ampullary tumor    . squamous cell cancer     Right leg  . squamous cell cancer left triceps Left    Dr. Lorenza Cambridge  . TONSILLECTOMY AND ADENOIDECTOMY       OB History  None      Home Medications    Prior to Admission medications   Medication Sig Start Date End Date Taking? Authorizing Provider  acetaminophen (TYLENOL) 500 MG tablet Take 500 mg by mouth every 6 (six) hours as needed for mild pain or moderate pain.    [provider]  alendronate (FOSAMAX) 70 MG tablet Take 1 tablet (70 mg total) by mouth every 7 (seven) days. Once a Week. Take with a full glass of water on an empty stomach. 06/03/17   Reed, Tiffany L, DO  bimatoprost (LUMIGAN) 0.01 % SOLN Place 1 drop into both eyes at bedtime.    [provider]  DULoxetine (CYMBALTA) 30 MG capsule Take 1 capsule (30 mg total) by mouth daily. 06/03/17   Reed, Tiffany L, DO    fluticasone (FLONASE) 50 MCG/ACT nasal spray Place 1 spray into both nostrils daily as needed.     [provider]  gabapentin (NEURONTIN) 300 MG capsule Take 1 capsule (300 mg total) by mouth at bedtime. 11/11/17   Reed, Tiffany L, DO  Inulin (FIBER CHOICE PO) Take 5 g by mouth 2 (two) times daily.    [provider]  levothyroxine (SYNTHROID, LEVOTHROID) 50 MCG tablet TAKE 1 TABLET DAILY FOR THYROID SUPPLEMENT 12/05/17   Reed, Tiffany L, DO  Menthol, Topical Analgesic, (BIOFREEZE) 4 % GEL Apply 1 application topically daily as needed (Knee pain).    [provider]  Multiple Vitamin (MULTIVITAMIN WITH MINERALS) TABS tablet Take 1 tablet by mouth daily.    [provider]  Omega-3 Fatty Acids (FISH OIL) 1200 MG CAPS Take 1,200 mg by mouth 3 (three) times daily.     [provider]  omeprazole (PRILOSEC) 20 MG capsule TAKE 1 CAPSULE DAILY TO REDUCE STOMACH ACID. 07/03/17   Reed, Tiffany L, DO  Polyethyl Glycol-Propyl Glycol (SYSTANE ULTRA OP) Place 1 drop into both eyes as needed (dry eyes).     [provider]  UNABLE TO FIND Take 1 tablet daily by mouth. Bone Care: Calcium 1000mg , magnesium, vitamins D3 2000 units and K: Take one tablet by mouth every evening. 07/23/17   Lauree Chandler, NP    Family History Family History  Problem Relation Age of Onset  . Cancer Mother        spindle cell carcinoma on neck  . Colon cancer Father 9  . Breast cancer Sister   . Colon polyps Sister   . Heart disease Brother   . Breast cancer Unknown        neiece x 3  . Esophageal cancer Neg Hx   . Pancreatic cancer Neg Hx   . Kidney disease Neg Hx   . Liver disease Neg Hx     Social History Social History   Tobacco Use  . Smoking status: Never Smoker  . Smokeless tobacco: Never Used  Substance Use Topics  . Alcohol use: No  . Drug use: No     Allergies   Penicillins; Codeine; and Lodine [etodolac]   Review of Systems Review of  Systems Ten systems reviewed and are negative for acute change, except as noted in the HPI.    Physical Exam Updated Vital Signs BP (!) 173/69 (BP Location: Left Arm)   Pulse 73   Temp 97.6 F (36.4 C) (Oral)   Resp 16   Ht 5\' 3"  (1.6 m)   Wt 78.5 kg (173 lb)   SpO2 100%   BMI 30.65 kg/m   Physical Exam  Constitutional: She is oriented to person, place, and time. She appears well-developed and well-nourished. No distress.  Nontoxic appearing and in NAD  HENT:  Head: Normocephalic and atraumatic.  Eyes: Conjunctivae and EOM are normal. No scleral icterus.  Neck: Normal range of motion.  Cardiovascular: Normal rate, regular rhythm and intact distal pulses.  DP pulse 2+ in the BLE  Pulmonary/Chest: Effort normal. No respiratory distress.  Respirations even and unlabored  Musculoskeletal: Normal range of motion.  Trace pitting edema noted to BLE. TTP to the proximal left posterior calf. No palpable cords. Varicose veins in bilateral distal medial ankles.  Neurological: She is alert and oriented to person, place, and time. She exhibits normal muscle tone. Coordination normal.  Sensation to light touch intact in BLE.   Skin: Skin is warm and dry. No rash noted. She is not diaphoretic. No erythema. No pallor.  Warm distal lower extremities.  Psychiatric: She has a normal mood and affect. Her behavior is normal.  Nursing note and vitals reviewed.    ED Treatments / Results  Labs (all labs ordered are listed, but only abnormal results are displayed) Labs Reviewed  BASIC METABOLIC PANEL - Abnormal; Notable for the following components:      Result Value   Potassium 3.1 (*)    Glucose, Bld 112 (*)    All other components within normal limits  CBC WITH DIFFERENTIAL/PLATELET    EKG None   Radiology  LE VENOUS DUPLEX - 01/10/18 Preliminary results by tech - Venous Duplex Lower Ext. Completed. Right leg, negative for DVT. Left leg, positive for chronic appearing DVT in the  posterior tibial and peroneal veins. Rita Sturdivant, BS, RDMS, RVT  No results found.    Procedures Procedures (including critical care time)  Medications Ordered in ED Medications - No data to display   Initial Impression / Assessment and Plan / ED Course  I have reviewed the triage vital signs and the nursing notes.  Pertinent labs & imaging results that were available during my care of the patient were reviewed by me and considered in my medical decision making (see chart for details).     84 year old female presents to the emergency department for evaluation of left lower extremity pain and swelling.  She has a history of left lower extremity DVT with evidence of persistent chronic DVT in her posterior tibial and peroneal veins.  No evidence of acute blood clot in bilateral lower extremities on ultrasound.  The patient discontinued Eliquis approximately 1 week before her 26-month treatment period.  This was due to concern for fall risk.  Given lack of acute thrombosis and location of prior DVTs, will withhold restarting of blood thinners at this time.  I have advised the patient to follow-up with her primary care doctor to discuss whether or not the patient should restart this medication.  Patient otherwise neurovascularly intact, hemodynamically stable.  Return precautions discussed and provided. Patient discharged in stable condition with no unaddressed concerns.   Final Clinical Impressions(s) / ED Diagnoses   Final diagnoses:  Chronic deep vein thrombosis (DVT) of left lower extremity, unspecified vein [facility])    ED Discharge Orders    None       Antonietta Breach, PA-C 01/10/18 2302    Carmin Muskrat, MD 01/11/18 [phone]

## 2018-01-10 NOTE — ED Notes (Signed)
Results reviewed.  No changes in acuity at this time 

## 2018-01-10 NOTE — ED Notes (Signed)
ED Provider at bedside. 

## 2018-01-10 NOTE — ED Provider Notes (Signed)
Patient placed in Quick Look pathway, seen and evaluated   Chief Complaint: left calf pain  HPI:   Catalea Labrecque is a 84 y.o. female who presents to the ED for left leg swelling and pain. Patient reports that she had been treated for DVT with Eliquis and just stopped her medication about a week ago. Today the PT came to her home and she told them about the pain in her leg. Patient reports that the PT pressed on her calf and there was severe pain. Patient was told she needed to have it evaluated. Patient went to Urgent Care and they told her to come to the ED because they could not do study there.   ROS: M/S: lower leg pain and swelling  Physical Exam:   Gen: No distress  Neuro: Awake and Alert  M/S: left lower leg with swelling, tender with palpation of left calf.     Initiation of care has begun. The patient has been counseled on the process, plan, and necessity for staying for the completion/evaluation, and the remainder of the medical screening examination    Ashley Murrain, NP 01/10/18 1432    Blanchie Dessert, MD 01/10/18 2102

## 2018-01-10 NOTE — Progress Notes (Signed)
Preliminary results by tech - Venous Duplex Lower Ext. Completed. Right leg, negative for DVT. Left leg, positive for chronic appearing DVT in the posterior tibial and peroneal veins. Oda Cogan, BS, RDMS, RVT

## 2018-01-13 NOTE — Telephone Encounter (Signed)
Noted on Friday.  Has chronic DVT.

## 2018-01-14 DIAGNOSIS — Z7901 Long term (current) use of anticoagulants: Secondary | ICD-10-CM | POA: Diagnosis not present

## 2018-01-14 DIAGNOSIS — Z9181 History of falling: Secondary | ICD-10-CM | POA: Diagnosis not present

## 2018-01-14 DIAGNOSIS — M810 Age-related osteoporosis without current pathological fracture: Secondary | ICD-10-CM | POA: Diagnosis not present

## 2018-01-14 DIAGNOSIS — M6281 Muscle weakness (generalized): Secondary | ICD-10-CM | POA: Diagnosis not present

## 2018-01-14 DIAGNOSIS — R260 Ataxic gait: Secondary | ICD-10-CM | POA: Diagnosis not present

## 2018-01-14 DIAGNOSIS — Z86718 Personal history of other venous thrombosis and embolism: Secondary | ICD-10-CM | POA: Diagnosis not present

## 2018-01-16 DIAGNOSIS — M6281 Muscle weakness (generalized): Secondary | ICD-10-CM | POA: Diagnosis not present

## 2018-01-16 DIAGNOSIS — Z7901 Long term (current) use of anticoagulants: Secondary | ICD-10-CM | POA: Diagnosis not present

## 2018-01-16 DIAGNOSIS — Z9181 History of falling: Secondary | ICD-10-CM | POA: Diagnosis not present

## 2018-01-16 DIAGNOSIS — M810 Age-related osteoporosis without current pathological fracture: Secondary | ICD-10-CM | POA: Diagnosis not present

## 2018-01-16 DIAGNOSIS — Z86718 Personal history of other venous thrombosis and embolism: Secondary | ICD-10-CM | POA: Diagnosis not present

## 2018-01-16 DIAGNOSIS — R260 Ataxic gait: Secondary | ICD-10-CM | POA: Diagnosis not present

## 2018-01-21 DIAGNOSIS — M810 Age-related osteoporosis without current pathological fracture: Secondary | ICD-10-CM | POA: Diagnosis not present

## 2018-01-21 DIAGNOSIS — Z86718 Personal history of other venous thrombosis and embolism: Secondary | ICD-10-CM | POA: Diagnosis not present

## 2018-01-21 DIAGNOSIS — Z7901 Long term (current) use of anticoagulants: Secondary | ICD-10-CM | POA: Diagnosis not present

## 2018-01-21 DIAGNOSIS — R260 Ataxic gait: Secondary | ICD-10-CM | POA: Diagnosis not present

## 2018-01-21 DIAGNOSIS — Z9181 History of falling: Secondary | ICD-10-CM | POA: Diagnosis not present

## 2018-01-21 DIAGNOSIS — M6281 Muscle weakness (generalized): Secondary | ICD-10-CM | POA: Diagnosis not present

## 2018-01-23 DIAGNOSIS — Z9181 History of falling: Secondary | ICD-10-CM | POA: Diagnosis not present

## 2018-01-23 DIAGNOSIS — M6281 Muscle weakness (generalized): Secondary | ICD-10-CM | POA: Diagnosis not present

## 2018-01-23 DIAGNOSIS — M810 Age-related osteoporosis without current pathological fracture: Secondary | ICD-10-CM | POA: Diagnosis not present

## 2018-01-23 DIAGNOSIS — Z7901 Long term (current) use of anticoagulants: Secondary | ICD-10-CM | POA: Diagnosis not present

## 2018-01-23 DIAGNOSIS — Z86718 Personal history of other venous thrombosis and embolism: Secondary | ICD-10-CM | POA: Diagnosis not present

## 2018-01-23 DIAGNOSIS — R260 Ataxic gait: Secondary | ICD-10-CM | POA: Diagnosis not present

## 2018-01-28 DIAGNOSIS — Z7901 Long term (current) use of anticoagulants: Secondary | ICD-10-CM | POA: Diagnosis not present

## 2018-01-28 DIAGNOSIS — Z9181 History of falling: Secondary | ICD-10-CM | POA: Diagnosis not present

## 2018-01-28 DIAGNOSIS — Z86718 Personal history of other venous thrombosis and embolism: Secondary | ICD-10-CM | POA: Diagnosis not present

## 2018-01-28 DIAGNOSIS — R260 Ataxic gait: Secondary | ICD-10-CM | POA: Diagnosis not present

## 2018-01-28 DIAGNOSIS — M6281 Muscle weakness (generalized): Secondary | ICD-10-CM | POA: Diagnosis not present

## 2018-01-28 DIAGNOSIS — M810 Age-related osteoporosis without current pathological fracture: Secondary | ICD-10-CM | POA: Diagnosis not present

## 2018-01-31 DIAGNOSIS — Z9181 History of falling: Secondary | ICD-10-CM | POA: Diagnosis not present

## 2018-01-31 DIAGNOSIS — M6281 Muscle weakness (generalized): Secondary | ICD-10-CM | POA: Diagnosis not present

## 2018-01-31 DIAGNOSIS — M810 Age-related osteoporosis without current pathological fracture: Secondary | ICD-10-CM | POA: Diagnosis not present

## 2018-01-31 DIAGNOSIS — Z86718 Personal history of other venous thrombosis and embolism: Secondary | ICD-10-CM | POA: Diagnosis not present

## 2018-01-31 DIAGNOSIS — R260 Ataxic gait: Secondary | ICD-10-CM | POA: Diagnosis not present

## 2018-01-31 DIAGNOSIS — Z7901 Long term (current) use of anticoagulants: Secondary | ICD-10-CM | POA: Diagnosis not present

## 2018-02-04 DIAGNOSIS — R260 Ataxic gait: Secondary | ICD-10-CM | POA: Diagnosis not present

## 2018-02-04 DIAGNOSIS — M810 Age-related osteoporosis without current pathological fracture: Secondary | ICD-10-CM | POA: Diagnosis not present

## 2018-02-04 DIAGNOSIS — M6281 Muscle weakness (generalized): Secondary | ICD-10-CM | POA: Diagnosis not present

## 2018-02-04 DIAGNOSIS — Z9181 History of falling: Secondary | ICD-10-CM | POA: Diagnosis not present

## 2018-02-04 DIAGNOSIS — Z86718 Personal history of other venous thrombosis and embolism: Secondary | ICD-10-CM | POA: Diagnosis not present

## 2018-02-04 DIAGNOSIS — Z7901 Long term (current) use of anticoagulants: Secondary | ICD-10-CM | POA: Diagnosis not present

## 2018-02-05 DIAGNOSIS — M6281 Muscle weakness (generalized): Secondary | ICD-10-CM | POA: Diagnosis not present

## 2018-02-05 DIAGNOSIS — Z86718 Personal history of other venous thrombosis and embolism: Secondary | ICD-10-CM | POA: Diagnosis not present

## 2018-02-05 DIAGNOSIS — M810 Age-related osteoporosis without current pathological fracture: Secondary | ICD-10-CM | POA: Diagnosis not present

## 2018-02-05 DIAGNOSIS — Z7901 Long term (current) use of anticoagulants: Secondary | ICD-10-CM | POA: Diagnosis not present

## 2018-02-05 DIAGNOSIS — R260 Ataxic gait: Secondary | ICD-10-CM | POA: Diagnosis not present

## 2018-02-05 DIAGNOSIS — Z9181 History of falling: Secondary | ICD-10-CM | POA: Diagnosis not present

## 2018-02-06 ENCOUNTER — Ambulatory Visit (INDEPENDENT_AMBULATORY_CARE_PROVIDER_SITE_OTHER): Payer: Medicare Other | Admitting: Internal Medicine

## 2018-02-06 ENCOUNTER — Encounter: Payer: Self-pay | Admitting: Internal Medicine

## 2018-02-06 VITALS — BP 126/70 | HR 75 | Temp 98.1°F | Ht 63.0 in | Wt 173.0 lb

## 2018-02-06 DIAGNOSIS — K861 Other chronic pancreatitis: Secondary | ICD-10-CM | POA: Diagnosis not present

## 2018-02-06 DIAGNOSIS — K224 Dyskinesia of esophagus: Secondary | ICD-10-CM | POA: Diagnosis not present

## 2018-02-06 DIAGNOSIS — L570 Actinic keratosis: Secondary | ICD-10-CM | POA: Diagnosis not present

## 2018-02-06 DIAGNOSIS — C241 Malignant neoplasm of ampulla of Vater: Secondary | ICD-10-CM | POA: Diagnosis not present

## 2018-02-06 DIAGNOSIS — I825Z2 Chronic embolism and thrombosis of unspecified deep veins of left distal lower extremity: Secondary | ICD-10-CM

## 2018-02-06 DIAGNOSIS — R296 Repeated falls: Secondary | ICD-10-CM | POA: Diagnosis not present

## 2018-02-06 DIAGNOSIS — Z85828 Personal history of other malignant neoplasm of skin: Secondary | ICD-10-CM | POA: Diagnosis not present

## 2018-02-06 DIAGNOSIS — L905 Scar conditions and fibrosis of skin: Secondary | ICD-10-CM | POA: Diagnosis not present

## 2018-02-06 NOTE — Patient Instructions (Signed)
Continue to try to drink plenty of water each day especially in the heat.  Also, drink one glucerna if you miss a meal.  Be careful walking around at home not to fall.  Call me if you do not feel well or any new concerns come up.

## 2018-02-06 NOTE — Progress Notes (Signed)
Location:  [facility] clinic Provider:  Kenda Kloehn L. Mariea Clonts, D.O., C.M.D.  Code Status: DNR Goals of Care:  Advanced Directives 02/06/2018  Does Patient Have a Medical Advance Directive? Yes  Type of Advance Directive Living will;Out of facility DNR (pink MOST or yellow form)  Does patient want to make changes to medical advance directive? No - Patient declined  Copy of [place] in Chart? -  Pre-existing out of facility DNR order (yellow form or pink MOST form) Yellow form placed in chart (order not valid for inpatient use)   Chief Complaint  Patient presents with  . Medical Management of Chronic Issues    3MTH FOLLOW-UP    HPI: Patient is a 84 y.o. female seen today for medical management of chronic diseases.  She was initially scheduled to see me today b/c it would be time to d/c her NOAC she was on for an acute DVT; however, she was in the ED with a fall and reported mulitple other falls so the ED physician had stopped the blood thinner and notified me about it.  She remains very unsteady and has multiple stairs to get in and out of her trailer and a concrete patio.    She had PT for her knee.  Has only one more week of therapy.  Has a cane with two handles and a light.  Has the walker inside.  She does not have her ramp installed yet.  They are trying to get one that is more gradual b/c she might just slide on down.  Has to go through a production to get inside.  Has a cement porch.  She does have 6 or 7 fans inside, but no air conditioning.  There is one in the back of the house recirculating the air.  Discussed hydration.  Went to visit her 61 yo sister on Monday so didn't drink as much then.  Drinks water and sprite.  Weight has stabilized.  Discussed drinking glucerna if she misses a meal.  Her nephew's girlfriend has come with her today.   Her nephew is helping look after her. One built her a ramp but then it seemed too steep so they were going to rework this..   If she  takes too big of a bite, it's hard to get it chewed up and swallowed (like jimmy dean[employer] biscuit bread might get stuck).  No abdominal pain.    She is going to dermatology, too, today.  He's going to check her sore on top of her left foot and some skin tags she has.  Past Medical History:  Diagnosis Date  . Abdominal pain, epigastric   . Allergic rhinitis due to other allergen   . Allergic rhinitis, cause unspecified   . Dermatophytosis of nail   . Diffuse cystic mastopathy   . Diverticulosis of colon (without mention of hemorrhage)   . DVT (deep venous thrombosis) ([place])   . Dysphagia, unspecified(787.20)   . Esophageal reflux   . Family history of malignant neoplasm of gastrointestinal tract   . Hiatal hernia   . Hyperlipidemia   . Hypertension   . Internal hemorrhoids without mention of complication   . 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 Obesity   . Oral aphthae   . Osteoarthrosis, unspecified whether generalized or localized, unspecified site   . Other abnormal blood chemistry   . Other voice and resonance disorders   . Pain in joint, pelvic region and thigh   .  Palpitations   . Pancreatitis   . Peripheral vascular disease, unspecified ([place])   . Schatzki's ring   . Senile osteoporosis   . Torus fracture of fibula   . Unspecified constipation   . Unspecified hypothyroidism     Past Surgical History:  Procedure Laterality Date  . APPENDECTOMY    . BASAL CELL CARCINOMA EXCISION     right humerus, right temple, left side of nose  . BASAL CELL CARCINOMA EXCISION     chest, left cheek  . CATARACT EXTRACTION, BILATERAL    . CHOLECYSTECTOMY    . keratosis     left arm  . resection of ampullary tumor    . squamous cell cancer     Right leg  . squamous cell cancer left triceps Left    Dr. Lorenza Cambridge  . TONSILLECTOMY AND ADENOIDECTOMY      Allergies  Allergen Reactions  . Penicillins     Has patient had a PCN reaction causing immediate rash,  facial/tongue/throat swelling, SOB or lightheadedness with hypotension: yes Has patient had a PCN reaction causing severe rash involving mucus membranes or skin necrosis: no Has patient had a PCN reaction that required hospitalization: no Has patient had a PCN reaction occurring within the last 10 years: no If all of the above answers are "NO", then may proceed with Cephalosporin use.  . Codeine Nausea And Vomiting  . Lodine [Etodolac] Nausea And Vomiting    Outpatient Encounter Medications as of 02/06/2018  Medication Sig  . acetaminophen (TYLENOL) 500 MG tablet Take 500 mg by mouth every 6 (six) hours as needed for mild pain or moderate pain.  Marland Kitchen alendronate (FOSAMAX) 70 MG tablet Take 1 tablet (70 mg total) by mouth every 7 (seven) days. Once a Week. Take with a full glass of water on an empty stomach.  . bimatoprost (LUMIGAN) 0.01 % SOLN Place 1 drop into both eyes at bedtime.  . DULoxetine (CYMBALTA) 30 MG capsule Take 1 capsule (30 mg total) by mouth daily.  . fluticasone (FLONASE) 50 MCG/ACT nasal spray Place 1 spray into both nostrils daily as needed.   . gabapentin (NEURONTIN) 300 MG capsule Take 1 capsule (300 mg total) by mouth at bedtime.  . Inulin (FIBER CHOICE PO) Take 5 g by mouth 2 (two) times daily.  Marland Kitchen levothyroxine (SYNTHROID, LEVOTHROID) 50 MCG tablet TAKE 1 TABLET DAILY FOR THYROID SUPPLEMENT  . Menthol, Topical Analgesic, (BIOFREEZE) 4 % GEL Apply 1 application topically daily as needed (Knee pain).  . Multiple Vitamin (MULTIVITAMIN WITH MINERALS) TABS tablet Take 1 tablet by mouth daily.  . Omega-3 Fatty Acids (FISH OIL) 1200 MG CAPS Take 1,200 mg by mouth 3 (three) times daily.   Marland Kitchen omeprazole (PRILOSEC) 20 MG capsule TAKE 1 CAPSULE DAILY TO REDUCE STOMACH ACID.  Marland Kitchen Polyethyl Glycol-Propyl Glycol (SYSTANE ULTRA OP) Place 1 drop into both eyes as needed (dry eyes).   Marland Kitchen UNABLE TO FIND Take 1 tablet daily by mouth. Bone Care: Calcium 1000mg , magnesium, vitamins D3 2000 units  and K: Take one tablet by mouth every evening.   No facility-administered encounter medications on file as of 02/06/2018.     Review of Systems:  Review of Systems  Constitutional: Negative for chills, fever and malaise/fatigue.  HENT: Positive for hearing loss.   Eyes: Negative for blurred vision.       Glasses  Respiratory: Positive for shortness of breath. Negative for cough and wheezing.        Dyspneic with significant  exertion, but not just walking   Cardiovascular: Positive for leg swelling. Negative for chest pain and palpitations.  Gastrointestinal: Negative for abdominal pain, blood in stool, constipation and melena.  Genitourinary: Negative for dysuria.  Musculoskeletal: Negative for falls and joint pain.  Skin: Negative for itching and rash.  Neurological: Negative for dizziness and loss of consciousness.  Endo/Heme/Allergies: Bruises/bleeds easily.  Psychiatric/Behavioral: Positive for memory loss.       Told me a story about not recognizing her sister over the memorial day weekend    Health Maintenance  Topic Date Due  . PNA vac Low Risk Adult (2 of 2 - PCV13) 07/04/2017  . TETANUS/TDAP  09/10/2017  . MAMMOGRAM  01/29/2018  . INFLUENZA VACCINE  04/10/2018  . DEXA SCAN  Completed    Physical Exam: Vitals:   02/06/18 1117  BP: 126/70  Pulse: 75  Temp: 98.1 F (36.7 C)  TempSrc: Oral  SpO2: 96%  Weight: 173 lb (78.5 kg)  Height: 5\' 3"  (1.6 m)   Body mass index is 30.65 kg/m. Physical Exam  Constitutional: She appears well-developed and well-nourished. No distress.  HENT:  Head: Normocephalic and atraumatic.  Eyes:  glasses  Cardiovascular: Normal rate, regular rhythm, normal heart sounds and intact distal pulses.  Bilateral LE edema, varicose veins and hyperpigmentation  Pulmonary/Chest: Effort normal and breath sounds normal. No respiratory distress. She has no wheezes. She has no rales.  Abdominal: Bowel sounds are normal.  Musculoskeletal:  Walks  with cane with two handles and a flashlight on it and wide base  Neurological: She is alert.  Oriented to person, place but did not remember her sister when she saw her  Skin: Skin is warm and dry.  Papule on dorsum of left foot with abrasion; cutaneous horns on left arm, leg  Psychiatric: She has a normal mood and affect.    Labs reviewed: Basic Metabolic Panel: Recent Labs    02/26/17 1230  11/11/17 1244 01/03/18 1043 01/10/18 1443  NA 142   < > 142 137 141  K 4.0   < > 4.2 3.6 3.1*  CL 106   < > 104 104 103  CO2 26   < > 31 23 30   GLUCOSE 110*   < > 99 100* 112*  BUN 15   < > 13 11 9   CREATININE 0.71   < > 0.73 0.71 0.68  CALCIUM 9.4   < > 9.1 8.6* 9.0  TSH 1.24  --   --   --   --    < > = values in this interval not displayed.   Liver Function Tests: Recent Labs    02/26/17 1230 07/01/17 1035 01/03/18 1043  AST 21 18 31   ALT 14 15 21   ALKPHOS 42  --  41  BILITOT 0.7 0.6 0.6  PROT 6.8 6.4 6.5  ALBUMIN 4.2  --  3.6   No results for input(s): LIPASE, AMYLASE in the last 8760 hours. No results for input(s): AMMONIA in the last 8760 hours. CBC: Recent Labs    11/11/17 1244 01/03/18 1043 01/10/18 1443  WBC 6.2 4.4 4.5  NEUTROABS 3,509 2.5 2.0  HGB 12.7 12.7 12.2  HCT 36.8 38.6 37.1  MCV 90.4 93.5 92.1  PLT 192 122* 179   Lipid Panel: Recent Labs    07/01/17 1035  CHOL 265*  HDL 93  LDLCALC 154*  TRIG 75  CHOLHDL 2.8   Lab Results  Component Value Date   HGBA1C 5.3 07/01/2017  Assessment/Plan 1. Multiple falls -ongoing, finishing with home health therapy this week -unfortunately is left with unsafe steps, concrete patio in frail female who uses cane or walker to ambulate--challenged getting in and out of her home/trailer -also w/o AC but uses multiple fans -family to be helping with ramp installation to help keep pt in her current environment as she does not want to move -HCPOA document was brought in today to be scanned   2. Malignant  neoplasm of ampulla of Vater ([place]) -historical, no related symptoms at present, stable weight  3. Chronic pancreatitis, unspecified pancreatitis type ([place]) -no abdominal pain concerns lately  4. Chronic deep vein thrombosis (DVT) of distal vein of left lower extremity ([place]) -noted when she went to the ED and had doppler, now off anticoagulation due to severe fall risk and fall history  5. ESOPHAGEAL MOTILITY DISORDER -ongoing challenges with large pills and dry foods/breads--encouraged following these with liquids to help them down and small bites  Labs/tests ordered:  No orders of the defined types were placed in this encounter.   Next appt:  06/19/2018   Larrisa Cravey L. Tayleigh Wetherell, D.O. [facility] [address], [place] [address] Cell Phone (Mon-Fri 8am-5pm):  [phone] On Call:  [phone] & follow prompts after 5pm & weekends Office Phone:  [phone] Office Fax:  [phone]

## 2018-02-07 DIAGNOSIS — M6281 Muscle weakness (generalized): Secondary | ICD-10-CM | POA: Diagnosis not present

## 2018-02-07 DIAGNOSIS — R260 Ataxic gait: Secondary | ICD-10-CM | POA: Diagnosis not present

## 2018-02-07 DIAGNOSIS — M810 Age-related osteoporosis without current pathological fracture: Secondary | ICD-10-CM | POA: Diagnosis not present

## 2018-02-07 DIAGNOSIS — Z7901 Long term (current) use of anticoagulants: Secondary | ICD-10-CM | POA: Diagnosis not present

## 2018-02-07 DIAGNOSIS — Z86718 Personal history of other venous thrombosis and embolism: Secondary | ICD-10-CM | POA: Diagnosis not present

## 2018-02-07 DIAGNOSIS — Z9181 History of falling: Secondary | ICD-10-CM | POA: Diagnosis not present

## 2018-02-11 DIAGNOSIS — Z9181 History of falling: Secondary | ICD-10-CM | POA: Diagnosis not present

## 2018-02-11 DIAGNOSIS — Z86718 Personal history of other venous thrombosis and embolism: Secondary | ICD-10-CM | POA: Diagnosis not present

## 2018-02-11 DIAGNOSIS — M6281 Muscle weakness (generalized): Secondary | ICD-10-CM | POA: Diagnosis not present

## 2018-02-11 DIAGNOSIS — Z7901 Long term (current) use of anticoagulants: Secondary | ICD-10-CM | POA: Diagnosis not present

## 2018-02-11 DIAGNOSIS — R260 Ataxic gait: Secondary | ICD-10-CM | POA: Diagnosis not present

## 2018-02-11 DIAGNOSIS — M810 Age-related osteoporosis without current pathological fracture: Secondary | ICD-10-CM | POA: Diagnosis not present

## 2018-03-06 ENCOUNTER — Telehealth: Payer: Self-pay | Admitting: *Deleted

## 2018-03-06 NOTE — Telephone Encounter (Signed)
-----   Message from [place] sent at 03/05/2018  3:02 PM EDT ----- Regarding: FW: Pt Concern Hi Rodena Piety,  Are you able to assist me with this?  Or should I just call him back and schedule an appointment with whoever has the next opening? ----- Message ----- From: Despina Hidden, CMA Sent: 03/05/2018   2:43 PM To: [place] D Mccain Subject: RE: Pt Concern                                 i'm not in the office, i'm at [facility] today,  ----- Message ----- From: [place], [place]: 03/05/2018   2:38 PM To: Despina Hidden, CMA Subject: Pt Concern                                     Hi [name],  This patient's nephew, [name], just called wanting to schedule an appointment for tomorrow if possible.  She's having pain in the back of her legs.  I tried to get him to call and leave a message for medical assistant, but that didn't work.  I was thinking that they need to speak with Rodena Piety or a CMA before an appointment is scheduled.  Do you mind calling him for me please? His number is [phone]

## 2018-03-06 NOTE — Telephone Encounter (Signed)
Tried calling Mr [name] [phone].  LMOM to return call.

## 2018-03-07 NOTE — Telephone Encounter (Signed)
Nephew scheduled an appointment for Wednesday.

## 2018-03-12 ENCOUNTER — Ambulatory Visit (INDEPENDENT_AMBULATORY_CARE_PROVIDER_SITE_OTHER): Payer: Medicare Other | Admitting: Nurse Practitioner

## 2018-03-12 ENCOUNTER — Encounter: Payer: Self-pay | Admitting: Nurse Practitioner

## 2018-03-12 VITALS — BP 124/72 | HR 75 | Temp 97.8°F | Ht 63.0 in | Wt 171.0 lb

## 2018-03-12 DIAGNOSIS — R5381 Other malaise: Secondary | ICD-10-CM

## 2018-03-12 DIAGNOSIS — L03116 Cellulitis of left lower limb: Secondary | ICD-10-CM

## 2018-03-12 MED ORDER — DOXYCYCLINE HYCLATE 100 MG PO TABS: 100.0000 mg | ORAL_TABLET | Freq: Two times a day (BID) | ORAL | 0 refills | Status: DC

## 2018-03-12 NOTE — Progress Notes (Signed)
Careteam: Patient Care Team: Gayland Curry, DO as PCP - General (Geriatric Medicine) [name], MD as Consulting Physician (Gastroenterology) Stanford Breed Denice Bors, MD as Consulting Physician (Cardiology) Druscilla Brownie, MD as Consulting Physician (Dermatology) [name], MD as Consulting Physician (Ophthalmology)  Advanced Directive information Does Patient Have a Medical Advance Directive?: Yes, Type of Advance Directive: [place];Living will;Out of facility DNR (pink MOST or yellow form), Pre-existing out of facility DNR order (yellow form or pink MOST form): Yellow form placed in chart (order not valid for inpatient use)  Allergies  Allergen Reactions  . Penicillins     Has patient had a PCN reaction causing immediate rash, facial/tongue/throat swelling, SOB or lightheadedness with hypotension: yes Has patient had a PCN reaction causing severe rash involving mucus membranes or skin necrosis: no Has patient had a PCN reaction that required hospitalization: no Has patient had a PCN reaction occurring within the last 10 years: no If all of the above answers are "NO", then may proceed with Cephalosporin use.  . Codeine Nausea And Vomiting  . Lodine [Etodolac] Nausea And Vomiting    Chief Complaint  Patient presents with  . Acute Visit    Pt is being seen due to a sore area on back on left lower leg for 1 to 2 weeks.   Aviva Signs, family friend, in room     HPI: Patient is a 84 y.o. female seen in the office today open sore area on back on left leg.  Pt lives alone and family friend who lives over 1 hour away brought her to appt today.  Pt unable to see area but aware it is painful and draining.  No fevers or chills.   Would also like home health PT, has a new ramp and unsure how to navigate it. Afraid she may fall while trying to. Uses cane. Has had falls in the past but none recently.    Review of Systems:  Review of Systems    Constitutional: Negative for chills and fever.  Respiratory: Negative for shortness of breath.   Cardiovascular: Negative for chest pain.  Musculoskeletal: Positive for falls and myalgias.  Skin: Negative for itching and rash.       Painful open area to back on left leg.   Psychiatric/Behavioral: Positive for memory loss.    Past Medical History:  Diagnosis Date  . Abdominal pain, epigastric   . Allergic rhinitis due to other allergen   . Allergic rhinitis, cause unspecified   . Dermatophytosis of nail   . Diffuse cystic mastopathy   . Diverticulosis of colon (without mention of hemorrhage)   . DVT (deep venous thrombosis) ([place])   . Dysphagia, unspecified(787.20)   . Esophageal reflux   . Family history of malignant neoplasm of gastrointestinal tract   . Hiatal hernia   . Hyperlipidemia   . Hypertension   . Internal hemorrhoids without mention of complication   . 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 Obesity   . Oral aphthae   . Osteoarthrosis, unspecified whether generalized or localized, unspecified site   . Other abnormal blood chemistry   . Other voice and resonance disorders   . Pain in joint, pelvic region and thigh   . Palpitations   . Pancreatitis   . Peripheral vascular disease, unspecified ([place])   . Schatzki's ring   . Senile osteoporosis   . Torus fracture of fibula   . Unspecified  constipation   . Unspecified hypothyroidism    Past Surgical History:  Procedure Laterality Date  . APPENDECTOMY    . BASAL CELL CARCINOMA EXCISION     right humerus, right temple, left side of nose  . BASAL CELL CARCINOMA EXCISION     chest, left cheek  . CATARACT EXTRACTION, BILATERAL    . CHOLECYSTECTOMY    . keratosis     left arm  . resection of ampullary tumor    . squamous cell cancer     Right leg  . squamous cell cancer left triceps Left    Dr. Lorenza Cambridge  . TONSILLECTOMY AND ADENOIDECTOMY     Social History:   reports that she has never smoked.  She has never used smokeless tobacco. She reports that she does not drink alcohol or use drugs.  Family History  Problem Relation Age of Onset  . Cancer Mother        spindle cell carcinoma on neck  . Colon cancer Father 53  . Breast cancer Sister   . Colon polyps Sister   . Heart disease Brother   . Breast cancer Unknown        neiece x 3  . Esophageal cancer Neg Hx   . Pancreatic cancer Neg Hx   . Kidney disease Neg Hx   . Liver disease Neg Hx     Medications: Patient's Medications  New Prescriptions   No medications on file  Previous Medications   ACETAMINOPHEN (TYLENOL) 500 MG TABLET    Take 500 mg by mouth every 6 (six) hours as needed for mild pain or moderate pain.   ALENDRONATE (FOSAMAX) 70 MG TABLET    Take 1 tablet (70 mg total) by mouth every 7 (seven) days. Once a Week. Take with a full glass of water on an empty stomach.   BIMATOPROST (LUMIGAN) 0.01 % SOLN    Place 1 drop into both eyes at bedtime.   DULOXETINE (CYMBALTA) 30 MG CAPSULE    Take 1 capsule (30 mg total) by mouth daily.   FLUTICASONE (FLONASE) 50 MCG/ACT NASAL SPRAY    Place 1 spray into both nostrils daily as needed.    GABAPENTIN (NEURONTIN) 300 MG CAPSULE    Take 1 capsule (300 mg total) by mouth at bedtime.   INULIN (FIBER CHOICE PO)    Take 5 g by mouth 2 (two) times daily.   LEVOTHYROXINE (SYNTHROID, LEVOTHROID) 50 MCG TABLET    TAKE 1 TABLET DAILY FOR THYROID SUPPLEMENT   MENTHOL, TOPICAL ANALGESIC, (BIOFREEZE) 4 % GEL    Apply 1 application topically daily as needed (Knee pain).   MULTIPLE VITAMIN (MULTIVITAMIN WITH MINERALS) TABS TABLET    Take 1 tablet by mouth daily.   OMEGA-3 FATTY ACIDS (FISH OIL) 1200 MG CAPS    Take 1,200 mg by mouth 3 (three) times daily.    OMEPRAZOLE (PRILOSEC) 20 MG CAPSULE    TAKE 1 CAPSULE DAILY TO REDUCE STOMACH ACID.   POLYETHYL GLYCOL-PROPYL GLYCOL (SYSTANE ULTRA OP)    Place 1 drop into both eyes as needed (dry eyes).    UNABLE TO FIND    Take 1 tablet daily by  mouth. Bone Care: Calcium 1000mg , magnesium, vitamins D3 2000 units and K: Take one tablet by mouth every evening.  Modified Medications   No medications on file  Discontinued Medications   No medications on file     Physical Exam:  Vitals:   03/12/18 1111  BP: 124/72  Pulse:  75  Temp: 97.8 F (36.6 C)  TempSrc: Oral  SpO2: 96%  Weight: 171 lb (77.6 kg)  Height: 5\' 3"  (1.6 m)   Body mass index is 30.29 kg/m.  Physical Exam  Constitutional: She appears well-developed and well-nourished. No distress.  HENT:  Head: Normocephalic and atraumatic.  Eyes:  glasses  Cardiovascular: Normal rate, regular rhythm, normal heart sounds and intact distal pulses.  Bilateral LE edema, varicose veins and hyperpigmentation  Pulmonary/Chest: Effort normal and breath sounds normal. No respiratory distress. She has no wheezes. She has no rales.  Abdominal: Bowel sounds are normal.  Musculoskeletal:  Walks with cane with two handles and a flashlight on it and wide base  Neurological: She is alert.  Oriented to person, place but did not remember her sister when she saw her  Skin: Skin is warm and dry.  Papule on dorsum of left foot with abrasion; cutaneous horns on left arm, leg  Pt with 1 cm x 1 cm red tender area to bottom of left calf with serous drainage noted.   Psychiatric: She has a normal mood and affect.    Labs reviewed: Basic Metabolic Panel: Recent Labs    11/11/17 1244 01/03/18 1043 01/10/18 1443  NA 142 137 141  K 4.2 3.6 3.1*  CL 104 104 103  CO2 31 23 30   GLUCOSE 99 100* 112*  BUN 13 11 9   CREATININE 0.73 0.71 0.68  CALCIUM 9.1 8.6* 9.0   Liver Function Tests: Recent Labs    07/01/17 1035 01/03/18 1043  AST 18 31  ALT 15 21  ALKPHOS  --  41  BILITOT 0.6 0.6  PROT 6.4 6.5  ALBUMIN  --  3.6   No results for input(s): LIPASE, AMYLASE in the last 8760 hours. No results for input(s): AMMONIA in the last 8760 hours. CBC: Recent Labs    11/11/17 1244  01/03/18 1043 01/10/18 1443  WBC 6.2 4.4 4.5  NEUTROABS 3,509 2.5 2.0  HGB 12.7 12.7 12.2  HCT 36.8 38.6 37.1  MCV 90.4 93.5 92.1  PLT 192 122* 179   Lipid Panel: Recent Labs    07/01/17 1035  CHOL 265*  HDL 93  LDLCALC 154*  TRIG 75  CHOLHDL 2.8   TSH: No results for input(s): TSH in the last 8760 hours. A1C: Lab Results  Component Value Date   HGBA1C 5.3 07/01/2017     Assessment/Plan 1. Cellulitis of left lower extremity -pt unable to see or clean area which is a major barrier,  -localized redness however with tenderness and drainage will go ahead and treat as this has high likelihood of worsening without treatment.  Encouraged to clean area twice daily however pt without support in the home, neighbors will help occasionally but out of town at this time and family friend does not live close to her. They plan to make arrangement for someone to monitor area.  - doxycycline (VIBRA-TABS) 100 MG tablet; Take 1 tablet (100 mg total) by mouth 2 (two) times daily.  Dispense: 14 tablet; Refill: 0  2. Debility -recent ramp outside of home to help with mobility however she is afraid to navigate due to debility with frequent falls.  - Ambulatory referral to Home Health  Return precautions discussed in detail with pt and family friend for worsening of area/soreness/drainage/fever.  Carlos American. [place], [facility] [phone]

## 2018-03-12 NOTE — Patient Instructions (Signed)
Recommended to cleanse area twice daily (water and salt soaks are okay) To start doxycycline 100 mg by mouth twice daily with food for 7 days.  To monitor area, notify If drainage increases, changes in color, redness worsens.   To follow up if area does not improve or worsens to seek medication attention.

## 2018-03-17 ENCOUNTER — Other Ambulatory Visit: Payer: Self-pay | Admitting: Internal Medicine

## 2018-03-17 DIAGNOSIS — F419 Anxiety disorder, unspecified: Secondary | ICD-10-CM

## 2018-03-19 DIAGNOSIS — R5381 Other malaise: Secondary | ICD-10-CM

## 2018-03-19 DIAGNOSIS — I10 Essential (primary) hypertension: Secondary | ICD-10-CM | POA: Diagnosis not present

## 2018-03-19 DIAGNOSIS — L03116 Cellulitis of left lower limb: Secondary | ICD-10-CM | POA: Diagnosis not present

## 2018-03-19 DIAGNOSIS — I83813 Varicose veins of bilateral lower extremities with pain: Secondary | ICD-10-CM | POA: Diagnosis not present

## 2018-03-19 DIAGNOSIS — R413 Other amnesia: Secondary | ICD-10-CM

## 2018-03-19 DIAGNOSIS — R1310 Dysphagia, unspecified: Secondary | ICD-10-CM | POA: Diagnosis not present

## 2018-03-19 DIAGNOSIS — M810 Age-related osteoporosis without current pathological fracture: Secondary | ICD-10-CM | POA: Diagnosis not present

## 2018-03-19 DIAGNOSIS — M1990 Unspecified osteoarthritis, unspecified site: Secondary | ICD-10-CM | POA: Diagnosis not present

## 2018-03-24 DIAGNOSIS — M1990 Unspecified osteoarthritis, unspecified site: Secondary | ICD-10-CM | POA: Diagnosis not present

## 2018-03-24 DIAGNOSIS — R1310 Dysphagia, unspecified: Secondary | ICD-10-CM | POA: Diagnosis not present

## 2018-03-24 DIAGNOSIS — I83813 Varicose veins of bilateral lower extremities with pain: Secondary | ICD-10-CM | POA: Diagnosis not present

## 2018-03-24 DIAGNOSIS — M810 Age-related osteoporosis without current pathological fracture: Secondary | ICD-10-CM | POA: Diagnosis not present

## 2018-03-24 DIAGNOSIS — L03116 Cellulitis of left lower limb: Secondary | ICD-10-CM | POA: Diagnosis not present

## 2018-03-24 DIAGNOSIS — I10 Essential (primary) hypertension: Secondary | ICD-10-CM | POA: Diagnosis not present

## 2018-03-25 DIAGNOSIS — L03116 Cellulitis of left lower limb: Secondary | ICD-10-CM | POA: Diagnosis not present

## 2018-03-25 DIAGNOSIS — I83813 Varicose veins of bilateral lower extremities with pain: Secondary | ICD-10-CM | POA: Diagnosis not present

## 2018-03-25 DIAGNOSIS — M810 Age-related osteoporosis without current pathological fracture: Secondary | ICD-10-CM | POA: Diagnosis not present

## 2018-03-25 DIAGNOSIS — I10 Essential (primary) hypertension: Secondary | ICD-10-CM | POA: Diagnosis not present

## 2018-03-25 DIAGNOSIS — R1310 Dysphagia, unspecified: Secondary | ICD-10-CM | POA: Diagnosis not present

## 2018-03-25 DIAGNOSIS — M1990 Unspecified osteoarthritis, unspecified site: Secondary | ICD-10-CM | POA: Diagnosis not present

## 2018-03-26 DIAGNOSIS — M1990 Unspecified osteoarthritis, unspecified site: Secondary | ICD-10-CM | POA: Diagnosis not present

## 2018-03-26 DIAGNOSIS — L03116 Cellulitis of left lower limb: Secondary | ICD-10-CM | POA: Diagnosis not present

## 2018-03-26 DIAGNOSIS — I10 Essential (primary) hypertension: Secondary | ICD-10-CM | POA: Diagnosis not present

## 2018-03-26 DIAGNOSIS — M810 Age-related osteoporosis without current pathological fracture: Secondary | ICD-10-CM | POA: Diagnosis not present

## 2018-03-26 DIAGNOSIS — R1310 Dysphagia, unspecified: Secondary | ICD-10-CM | POA: Diagnosis not present

## 2018-03-26 DIAGNOSIS — I83813 Varicose veins of bilateral lower extremities with pain: Secondary | ICD-10-CM | POA: Diagnosis not present

## 2018-04-01 DIAGNOSIS — I10 Essential (primary) hypertension: Secondary | ICD-10-CM | POA: Diagnosis not present

## 2018-04-01 DIAGNOSIS — M1990 Unspecified osteoarthritis, unspecified site: Secondary | ICD-10-CM | POA: Diagnosis not present

## 2018-04-01 DIAGNOSIS — R1310 Dysphagia, unspecified: Secondary | ICD-10-CM | POA: Diagnosis not present

## 2018-04-01 DIAGNOSIS — M810 Age-related osteoporosis without current pathological fracture: Secondary | ICD-10-CM | POA: Diagnosis not present

## 2018-04-01 DIAGNOSIS — L03116 Cellulitis of left lower limb: Secondary | ICD-10-CM | POA: Diagnosis not present

## 2018-04-01 DIAGNOSIS — I83813 Varicose veins of bilateral lower extremities with pain: Secondary | ICD-10-CM | POA: Diagnosis not present

## 2018-04-02 DIAGNOSIS — R1310 Dysphagia, unspecified: Secondary | ICD-10-CM | POA: Diagnosis not present

## 2018-04-02 DIAGNOSIS — I10 Essential (primary) hypertension: Secondary | ICD-10-CM | POA: Diagnosis not present

## 2018-04-02 DIAGNOSIS — I83813 Varicose veins of bilateral lower extremities with pain: Secondary | ICD-10-CM | POA: Diagnosis not present

## 2018-04-02 DIAGNOSIS — M1990 Unspecified osteoarthritis, unspecified site: Secondary | ICD-10-CM | POA: Diagnosis not present

## 2018-04-02 DIAGNOSIS — L03116 Cellulitis of left lower limb: Secondary | ICD-10-CM | POA: Diagnosis not present

## 2018-04-02 DIAGNOSIS — M810 Age-related osteoporosis without current pathological fracture: Secondary | ICD-10-CM | POA: Diagnosis not present

## 2018-04-03 DIAGNOSIS — L03116 Cellulitis of left lower limb: Secondary | ICD-10-CM | POA: Diagnosis not present

## 2018-04-03 DIAGNOSIS — I10 Essential (primary) hypertension: Secondary | ICD-10-CM | POA: Diagnosis not present

## 2018-04-03 DIAGNOSIS — I83813 Varicose veins of bilateral lower extremities with pain: Secondary | ICD-10-CM | POA: Diagnosis not present

## 2018-04-03 DIAGNOSIS — M1990 Unspecified osteoarthritis, unspecified site: Secondary | ICD-10-CM | POA: Diagnosis not present

## 2018-04-03 DIAGNOSIS — R1310 Dysphagia, unspecified: Secondary | ICD-10-CM | POA: Diagnosis not present

## 2018-04-03 DIAGNOSIS — M810 Age-related osteoporosis without current pathological fracture: Secondary | ICD-10-CM | POA: Diagnosis not present

## 2018-04-07 DIAGNOSIS — M810 Age-related osteoporosis without current pathological fracture: Secondary | ICD-10-CM | POA: Diagnosis not present

## 2018-04-07 DIAGNOSIS — L03116 Cellulitis of left lower limb: Secondary | ICD-10-CM | POA: Diagnosis not present

## 2018-04-07 DIAGNOSIS — R1310 Dysphagia, unspecified: Secondary | ICD-10-CM | POA: Diagnosis not present

## 2018-04-07 DIAGNOSIS — M1990 Unspecified osteoarthritis, unspecified site: Secondary | ICD-10-CM | POA: Diagnosis not present

## 2018-04-07 DIAGNOSIS — I83813 Varicose veins of bilateral lower extremities with pain: Secondary | ICD-10-CM | POA: Diagnosis not present

## 2018-04-07 DIAGNOSIS — I10 Essential (primary) hypertension: Secondary | ICD-10-CM | POA: Diagnosis not present

## 2018-04-09 DIAGNOSIS — L03116 Cellulitis of left lower limb: Secondary | ICD-10-CM | POA: Diagnosis not present

## 2018-04-09 DIAGNOSIS — M810 Age-related osteoporosis without current pathological fracture: Secondary | ICD-10-CM | POA: Diagnosis not present

## 2018-04-09 DIAGNOSIS — I10 Essential (primary) hypertension: Secondary | ICD-10-CM | POA: Diagnosis not present

## 2018-04-09 DIAGNOSIS — R1310 Dysphagia, unspecified: Secondary | ICD-10-CM | POA: Diagnosis not present

## 2018-04-09 DIAGNOSIS — I83813 Varicose veins of bilateral lower extremities with pain: Secondary | ICD-10-CM | POA: Diagnosis not present

## 2018-04-09 DIAGNOSIS — M1990 Unspecified osteoarthritis, unspecified site: Secondary | ICD-10-CM | POA: Diagnosis not present

## 2018-04-11 DIAGNOSIS — L03116 Cellulitis of left lower limb: Secondary | ICD-10-CM | POA: Diagnosis not present

## 2018-04-11 DIAGNOSIS — M810 Age-related osteoporosis without current pathological fracture: Secondary | ICD-10-CM | POA: Diagnosis not present

## 2018-04-11 DIAGNOSIS — R1310 Dysphagia, unspecified: Secondary | ICD-10-CM | POA: Diagnosis not present

## 2018-04-11 DIAGNOSIS — I10 Essential (primary) hypertension: Secondary | ICD-10-CM | POA: Diagnosis not present

## 2018-04-11 DIAGNOSIS — I83813 Varicose veins of bilateral lower extremities with pain: Secondary | ICD-10-CM | POA: Diagnosis not present

## 2018-04-11 DIAGNOSIS — M1990 Unspecified osteoarthritis, unspecified site: Secondary | ICD-10-CM | POA: Diagnosis not present

## 2018-04-14 DIAGNOSIS — L03116 Cellulitis of left lower limb: Secondary | ICD-10-CM | POA: Diagnosis not present

## 2018-04-14 DIAGNOSIS — R1310 Dysphagia, unspecified: Secondary | ICD-10-CM | POA: Diagnosis not present

## 2018-04-14 DIAGNOSIS — I83813 Varicose veins of bilateral lower extremities with pain: Secondary | ICD-10-CM | POA: Diagnosis not present

## 2018-04-14 DIAGNOSIS — I10 Essential (primary) hypertension: Secondary | ICD-10-CM | POA: Diagnosis not present

## 2018-04-14 DIAGNOSIS — M810 Age-related osteoporosis without current pathological fracture: Secondary | ICD-10-CM | POA: Diagnosis not present

## 2018-04-14 DIAGNOSIS — M1990 Unspecified osteoarthritis, unspecified site: Secondary | ICD-10-CM | POA: Diagnosis not present

## 2018-04-16 DIAGNOSIS — M810 Age-related osteoporosis without current pathological fracture: Secondary | ICD-10-CM | POA: Diagnosis not present

## 2018-04-16 DIAGNOSIS — H40013 Open angle with borderline findings, low risk, bilateral: Secondary | ICD-10-CM | POA: Diagnosis not present

## 2018-04-16 DIAGNOSIS — I10 Essential (primary) hypertension: Secondary | ICD-10-CM | POA: Diagnosis not present

## 2018-04-16 DIAGNOSIS — I83813 Varicose veins of bilateral lower extremities with pain: Secondary | ICD-10-CM | POA: Diagnosis not present

## 2018-04-16 DIAGNOSIS — R1310 Dysphagia, unspecified: Secondary | ICD-10-CM | POA: Diagnosis not present

## 2018-04-16 DIAGNOSIS — M1990 Unspecified osteoarthritis, unspecified site: Secondary | ICD-10-CM | POA: Diagnosis not present

## 2018-04-16 DIAGNOSIS — L03116 Cellulitis of left lower limb: Secondary | ICD-10-CM | POA: Diagnosis not present

## 2018-04-16 DIAGNOSIS — H524 Presbyopia: Secondary | ICD-10-CM | POA: Diagnosis not present

## 2018-04-17 DIAGNOSIS — I83813 Varicose veins of bilateral lower extremities with pain: Secondary | ICD-10-CM | POA: Diagnosis not present

## 2018-04-17 DIAGNOSIS — L03116 Cellulitis of left lower limb: Secondary | ICD-10-CM | POA: Diagnosis not present

## 2018-04-17 DIAGNOSIS — M810 Age-related osteoporosis without current pathological fracture: Secondary | ICD-10-CM | POA: Diagnosis not present

## 2018-04-17 DIAGNOSIS — I10 Essential (primary) hypertension: Secondary | ICD-10-CM | POA: Diagnosis not present

## 2018-04-17 DIAGNOSIS — R1310 Dysphagia, unspecified: Secondary | ICD-10-CM | POA: Diagnosis not present

## 2018-04-17 DIAGNOSIS — M1990 Unspecified osteoarthritis, unspecified site: Secondary | ICD-10-CM | POA: Diagnosis not present

## 2018-04-18 DIAGNOSIS — I10 Essential (primary) hypertension: Secondary | ICD-10-CM | POA: Diagnosis not present

## 2018-04-18 DIAGNOSIS — L03116 Cellulitis of left lower limb: Secondary | ICD-10-CM | POA: Diagnosis not present

## 2018-04-18 DIAGNOSIS — M810 Age-related osteoporosis without current pathological fracture: Secondary | ICD-10-CM | POA: Diagnosis not present

## 2018-04-18 DIAGNOSIS — R1310 Dysphagia, unspecified: Secondary | ICD-10-CM | POA: Diagnosis not present

## 2018-04-18 DIAGNOSIS — I83813 Varicose veins of bilateral lower extremities with pain: Secondary | ICD-10-CM | POA: Diagnosis not present

## 2018-04-18 DIAGNOSIS — M1990 Unspecified osteoarthritis, unspecified site: Secondary | ICD-10-CM | POA: Diagnosis not present

## 2018-04-24 DIAGNOSIS — L03116 Cellulitis of left lower limb: Secondary | ICD-10-CM | POA: Diagnosis not present

## 2018-04-24 DIAGNOSIS — I83813 Varicose veins of bilateral lower extremities with pain: Secondary | ICD-10-CM | POA: Diagnosis not present

## 2018-04-24 DIAGNOSIS — R1310 Dysphagia, unspecified: Secondary | ICD-10-CM | POA: Diagnosis not present

## 2018-04-24 DIAGNOSIS — M810 Age-related osteoporosis without current pathological fracture: Secondary | ICD-10-CM | POA: Diagnosis not present

## 2018-04-24 DIAGNOSIS — M1990 Unspecified osteoarthritis, unspecified site: Secondary | ICD-10-CM | POA: Diagnosis not present

## 2018-04-24 DIAGNOSIS — I10 Essential (primary) hypertension: Secondary | ICD-10-CM | POA: Diagnosis not present

## 2018-04-28 ENCOUNTER — Telehealth: Payer: Self-pay | Admitting: *Deleted

## 2018-04-28 ENCOUNTER — Encounter: Payer: Self-pay | Admitting: Internal Medicine

## 2018-04-28 ENCOUNTER — Ambulatory Visit (INDEPENDENT_AMBULATORY_CARE_PROVIDER_SITE_OTHER): Payer: Medicare Other | Admitting: Internal Medicine

## 2018-04-28 DIAGNOSIS — M25562 Pain in left knee: Secondary | ICD-10-CM | POA: Diagnosis not present

## 2018-04-28 DIAGNOSIS — H6983 Other specified disorders of Eustachian tube, bilateral: Secondary | ICD-10-CM

## 2018-04-28 DIAGNOSIS — H6990 Unspecified Eustachian tube disorder, unspecified ear: Secondary | ICD-10-CM | POA: Insufficient documentation

## 2018-04-28 DIAGNOSIS — H6980 Other specified disorders of Eustachian tube, unspecified ear: Secondary | ICD-10-CM | POA: Insufficient documentation

## 2018-04-28 DIAGNOSIS — G8929 Other chronic pain: Secondary | ICD-10-CM | POA: Diagnosis not present

## 2018-04-28 DIAGNOSIS — R42 Dizziness and giddiness: Secondary | ICD-10-CM | POA: Diagnosis not present

## 2018-04-28 NOTE — Patient Instructions (Addendum)
Googal eustachian tube dysfunction. Drink thin  fluids liberally through the day and chew sugarless gum . Do the Valsalva maneuver several times a day to "pop" ears open. Generic Flonase 1 spray in each nostril daily as needed for pressure in the ears. Use the "crossover" technique as discussed.   ENT referral if no better.  Please take enteric-coated aspirin 81 mg daily with breakfast.  Please contact your local [place] [employer] for the form to allow delivery of mail to your door.  The distance of the mailbox from your front door; your osteoarthritis of the knees and need to use a 4 pronged cane for ambulation should medically qualify you for this service.

## 2018-04-28 NOTE — Progress Notes (Signed)
This is a Chief Financial Officer office visit  follow up for specific acute issue of dizziness.  Interim medical record and care since last [place] visit was updated with review of diagnostic studies and change in clinical status since last visit were documented.  HPI: The patient is here with her nephew's girlfriend Margarita Grizzle.  The patient describes intermittent pressure in her ears as if they need "popping".  She has intermittent tinnitus and she describes itching in her ears.  She has no other extrinsic or respiratory symptoms. Last week she was sitting, talking to her nephew on the phone when she experienced vague symptoms of "a rush of air over me, like a cloud". She lives alone and has no air conditioning.  She uses fans for cooling. She saw no actual change in the environment and denied any auditory or visual symptoms.  No slurring of her voice was noted by her nephew.  When she got up to walk, she stated that her legs were "trembly".  She denied any definite cardiac or neurologic prodrome prior to this brief episode.   Review of systems: As noted her history is very vague but she is oriented to the date Nephew and his girlfriend are also concerned  that she has severe arthritis in the knee & uses a four pronged cane for ambulation.  They state that her mailbox is quite a distance from the home and they are requesting a note to have the mail delivered to her door. She states that she has had a clot in the left leg; she is been taking ibuprofen for pain in the left knee.  She denies significant dyspepsia.  She does have some abdominal discomfort intermittently which is relieved by a bowel movement.  She has a past history of pancreatitis as well as diverticulosis.  Chart list a history of malignant neoplasm of the ampulla of Vater.  Constitutional: No fever Eyes: No redness, discharge, pain, vision change ENT/mouth: No nasal congestion,  purulent discharge, earache, new change in hearing, sore  throat  Cardiovascular: No chest pain, palpitations, paroxysmal nocturnal dyspnea, claudication Respiratory: No cough, sputum production, hemoptysis, DOE, significant snoring, apnea   Gastrointestinal: No heartburn, dysphagia, nausea /vomiting, rectal bleeding, melena, change in bowels Dermatologic: No pruritus Neurologic: No dizziness, headache, syncope, seizures, numbness, tingling Allergy/immunology: No itchy/watery eyes, significant sneezing, urticaria, angioedema  Physical exam:  Pertinent or positive findings: She appears younger than her age but does appear frail.  There is minimal,subtle unsustained nystagmus with right lateral gaze. Tuning fork exam localized to the right ear.  She had decreased hearing to whisper bilaterally.  The tympanic membranes were visible and revealed no significant pathology. She has lipedema of the ankles.  Pedal pulses are good.  She has a plaque-like keratotic lesions of the lower extremities with some hyperpigmentation.  General appearance:  no acute distress, increased work of breathing is present.   Lymphatic: No lymphadenopathy about the head, neck, axilla. Eyes: No conjunctival inflammation or lid edema is present. There is no scleral icterus. Ears:  External ear exam shows no significant lesions or deformities.   Nose:  External nasal examination shows no deformity or inflammation. Nasal mucosa are pink and moist without lesions, exudates Oral exam:  Lips and gums are healthy appearing. Neck:  No thyromegaly, masses, tenderness noted.    Heart:  Normal rate and regular rhythm. S1 and S2 normal without gallop, murmur, click, rub .  Lungs: Chest clear to auscultation without wheezes, rhonchi, rales, rubs.  Abdomen: Bowel sounds are normal. Abdomen is soft and nontender with no organomegaly, hernias, masses. GU: Deferred  Extremities:  No cyanosis, clubbing  Neurologic exam : Cn 2-7 intact EOM & FOV WNL Strength equal  in upper & lower  extremities Balance, Rhomberg, finger to nose testing could not be completed due to clinical state Skin: Warm & dry w/o tenting. No significant rash.  See summary under each active problem in the Problem List with associated updated therapeutic plan

## 2018-04-28 NOTE — Assessment & Plan Note (Addendum)
Degenerative joint disease limits her ambulation and she must use a 4 pronged cane See After Visit Summary for statement to support having her mail delivered to her door

## 2018-04-28 NOTE — Assessment & Plan Note (Signed)
Turning fork localizes to the right ear.  She has decreased hearing to whisper bilaterally. If she does not benefit from inhaled nasal steroid, valsalva maneuvers, and chewing sugarless gum; NT referral will be made. The isolated episode of "air rushing over me" defies diagnosis.  TIA is unlikely ,but prophylactic low-dose coated aspirin was recommended

## 2018-04-28 NOTE — Progress Notes (Signed)
                                             August 19,2019 To Whom It May Concern:   Tonya Russell is a 84 year old female with advanced degenerative joint disease of the knee. This dramatically impairs her ability to ambulate and she must use a 4 pronged cane to help her ambulate.  The distance of her mailbox from her home constitutes a medical hardship because of these factors.  Delivery of mail to her door is medically indicated.  Should you have any questions please contact me at [facility], [phone].                                       Respectfully,                                       Pascal Lux MD

## 2018-04-28 NOTE — Assessment & Plan Note (Signed)
See 04/28/18 no cardiac or neurologic prodrome prior to the events.  Symptoms are not positional.  History and exam suggest possible eustachian tube dysfunction

## 2018-04-28 NOTE — Telephone Encounter (Signed)
Family calling back stating they spoke with the post office and they need a letter on our letterhead stating the reason why pt can not go to the mailbox. Family stated they spoke with you at the visit today. Please advise

## 2018-04-29 ENCOUNTER — Telehealth: Payer: Self-pay | Admitting: *Deleted

## 2018-04-29 NOTE — Telephone Encounter (Signed)
Letter was written and is awaiting signature by provider. Once signed, family will be called to pick up the letter.

## 2018-04-29 NOTE — Telephone Encounter (Signed)
Patient called with concerns about Dr. Linna Darner recommendations to take enteric coated aspirin because on the box it says Aspirin and then it says (ASA) I explained that also means aspirin or acetylsalicylic acid. Pt stated she will take it but was concerned because of all the problems she have.

## 2018-05-01 NOTE — Telephone Encounter (Signed)
Spoke with patient and advised results   

## 2018-05-01 NOTE — Telephone Encounter (Signed)
Tonya Limes, MD  Despina Hidden, CMA        Please take enteric-coated aspirin 81 mg daily with breakfast to decrease risk of having TIAs or stroke.

## 2018-05-03 ENCOUNTER — Other Ambulatory Visit: Payer: Self-pay | Admitting: Internal Medicine

## 2018-05-15 DIAGNOSIS — L570 Actinic keratosis: Secondary | ICD-10-CM | POA: Diagnosis not present

## 2018-05-15 DIAGNOSIS — Z85828 Personal history of other malignant neoplasm of skin: Secondary | ICD-10-CM | POA: Diagnosis not present

## 2018-05-15 DIAGNOSIS — L819 Disorder of pigmentation, unspecified: Secondary | ICD-10-CM | POA: Diagnosis not present

## 2018-06-19 ENCOUNTER — Encounter: Payer: Self-pay | Admitting: Internal Medicine

## 2018-06-19 ENCOUNTER — Ambulatory Visit (INDEPENDENT_AMBULATORY_CARE_PROVIDER_SITE_OTHER): Payer: Medicare Other | Admitting: Internal Medicine

## 2018-06-19 VITALS — BP 138/70 | HR 67 | Temp 97.6°F | Ht 63.0 in | Wt 172.0 lb

## 2018-06-19 DIAGNOSIS — R296 Repeated falls: Secondary | ICD-10-CM

## 2018-06-19 DIAGNOSIS — M25562 Pain in left knee: Secondary | ICD-10-CM | POA: Diagnosis not present

## 2018-06-19 DIAGNOSIS — G3184 Mild cognitive impairment, so stated: Secondary | ICD-10-CM | POA: Diagnosis not present

## 2018-06-19 DIAGNOSIS — Z23 Encounter for immunization: Secondary | ICD-10-CM

## 2018-06-19 DIAGNOSIS — G8929 Other chronic pain: Secondary | ICD-10-CM | POA: Diagnosis not present

## 2018-06-19 DIAGNOSIS — R5381 Other malaise: Secondary | ICD-10-CM

## 2018-06-19 DIAGNOSIS — M542 Cervicalgia: Secondary | ICD-10-CM

## 2018-06-19 MED ORDER — CELECOXIB 100 MG PO CAPS: 100.0000 mg | ORAL_CAPSULE | Freq: Every day | ORAL | 3 refills | Status: DC

## 2018-06-19 NOTE — Patient Instructions (Addendum)
Please use your walker to balance and keep you from falling. Remember to use the biofreeze for pain.   Let's try celebrex in the morning for your pain.   You may also use the heat packs for your neck and shoulder area.

## 2018-06-19 NOTE — Progress Notes (Signed)
Location:  [facility] clinic Provider:  Janiyha Montufar L. Mariea Clonts, D.O., C.M.D.  Code Status: DNR Goals of Care:  Advanced Directives 04/28/2018  Does Patient Have a Medical Advance Directive? Yes  Type of Paramedic of [place];Living will;Out of facility DNR (pink MOST or yellow form)  Does patient want to make changes to medical advance directive? -  Copy of Tensed in Chart? Yes  Pre-existing out of facility DNR order (yellow form or pink MOST form) -     Chief Complaint  Patient presents with  . Medical Management of Chronic Issues    68mth follow-up    HPI: Patient is a 84 y.o. female seen today for medical management of chronic diseases.    She is hurting from the neck down.  It's worse when she gets up in the morning and when she sits a while.  Has not been taking her tylenol--she told her nephew, that it was like drinking water and did not help her.  His wife is coming along to pt's appts now fortunately, and brought all of her pills with her.    Dr. Linna Darner put her on a baby asa each morning.  She insists it does not help her--explained it's not for pain.  Talks about how when she went for a pedicure, she sat in the massage chair and that helped her neck.   Sometimes uses the biofreeze on her knee, but not consistent.  Says it's better than it had been.  Discussed trying celebrex.  Just got through home health PT.  Past Medical History:  Diagnosis Date  . Abdominal pain, epigastric   . Allergic rhinitis due to other allergen   . Allergic rhinitis, cause unspecified   . Dermatophytosis of nail   . Diffuse cystic mastopathy   . Diverticulosis of colon (without mention of hemorrhage)   . DVT (deep venous thrombosis) ([place])   . Dysphagia, unspecified(787.20)   . Esophageal reflux   . Family history of malignant neoplasm of gastrointestinal tract   . Hiatal hernia   . Hyperlipidemia   . Hypertension   . Internal hemorrhoids without mention  of complication   . 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 Obesity   . Oral aphthae   . Osteoarthrosis, unspecified whether generalized or localized, unspecified site   . Other abnormal blood chemistry   . Other voice and resonance disorders   . Pain in joint, pelvic region and thigh   . Palpitations   . Pancreatitis   . Peripheral vascular disease, unspecified ([place])   . Schatzki's ring   . Senile osteoporosis   . Torus fracture of fibula   . Unspecified constipation   . Unspecified hypothyroidism     Past Surgical History:  Procedure Laterality Date  . APPENDECTOMY    . BASAL CELL CARCINOMA EXCISION     right humerus, right temple, left side of nose  . BASAL CELL CARCINOMA EXCISION     chest, left cheek  . CATARACT EXTRACTION, BILATERAL    . CHOLECYSTECTOMY    . keratosis     left arm  . resection of ampullary tumor    . squamous cell cancer     Right leg  . squamous cell cancer left triceps Left    Dr. Lorenza [place]  . TONSILLECTOMY AND ADENOIDECTOMY      Allergies  Allergen Reactions  . Penicillins     Has patient had a PCN reaction causing immediate rash,  facial/tongue/throat swelling, SOB or lightheadedness with hypotension: yes Has patient had a PCN reaction causing severe rash involving mucus membranes or skin necrosis: no Has patient had a PCN reaction that required hospitalization: no Has patient had a PCN reaction occurring within the last 10 years: no If all of the above answers are "NO", then may proceed with Cephalosporin use.  . Codeine Nausea And Vomiting  . Lodine [Etodolac] Nausea And Vomiting    Outpatient Encounter Medications as of 06/19/2018  Medication Sig  . alendronate (FOSAMAX) 70 MG tablet TAKE 1 TABLET BY MOUTH EVERY SEVEN DAYS WITH A FULL GLASS OF WATER ON AN EMPTY STOMACH.  Marland Kitchen aspirin EC 81 MG tablet Take 81 mg by mouth daily.  . bimatoprost (LUMIGAN) 0.01 % SOLN Place 1 drop into both eyes at bedtime.  . DULoxetine (CYMBALTA) 30  MG capsule TAKE 1 CAPSULE EVERY DAY  . fluticasone (FLONASE) 50 MCG/ACT nasal spray Place 1 spray into both nostrils daily as needed.   . gabapentin (NEURONTIN) 300 MG capsule Take 1 capsule (300 mg total) by mouth at bedtime.  . Inulin (FIBER CHOICE PO) Take 5 g by mouth 2 (two) times daily.  Marland Kitchen levothyroxine (SYNTHROID, LEVOTHROID) 50 MCG tablet TAKE 1 TABLET DAILY FOR THYROID SUPPLEMENT  . Menthol, Topical Analgesic, (BIOFREEZE) 4 % GEL Apply 1 application topically daily as needed (Knee pain).  . Multiple Vitamin (MULTIVITAMIN WITH MINERALS) TABS tablet Take 1 tablet by mouth daily.  . Omega-3 Fatty Acids (FISH OIL) 1200 MG CAPS Take 1,200 mg by mouth 3 (three) times daily.   Marland Kitchen omeprazole (PRILOSEC) 20 MG capsule TAKE 1 CAPSULE DAILY TO REDUCE STOMACH ACID.  Marland Kitchen Polyethyl Glycol-Propyl Glycol (SYSTANE ULTRA OP) Place 1 drop into both eyes as needed (dry eyes).   Marland Kitchen UNABLE TO FIND Take 1 tablet daily by mouth. Bone Care: Calcium 1000mg , magnesium, vitamins D3 2000 units and K: Take one tablet by mouth every evening.  . [DISCONTINUED] acetaminophen (TYLENOL) 500 MG tablet Take 500 mg by mouth every 6 (six) hours as needed for mild pain or moderate pain.   No facility-administered encounter medications on file as of 06/19/2018.     Review of Systems:  Review of Systems  Constitutional: Negative for chills, fever and malaise/fatigue.  HENT: Positive for hearing loss. Negative for congestion.   Eyes: Positive for blurred vision.       Glasses; field of vision test could not be performed at her ophtho due to pt being unable to keep her forehead against the device as instructed  Respiratory: Negative for cough and shortness of breath.   Cardiovascular: Positive for leg swelling. Negative for chest pain and palpitations.  Gastrointestinal: Negative for abdominal pain, blood in stool, constipation, diarrhea and melena.  Genitourinary: Negative for dysuria.  Musculoskeletal: Positive for joint pain,  myalgias and neck pain.       Balance poor  Neurological: Negative for dizziness and loss of consciousness.  Endo/Heme/Allergies: Bruises/bleeds easily.  Psychiatric/Behavioral: Positive for memory loss. Negative for depression. The patient is not nervous/anxious and does not have insomnia.     Health Maintenance  Topic Date Due  . PNA vac Low Risk Adult (2 of 2 - PCV13) 07/04/2017  . TETANUS/TDAP  09/10/2017  . MAMMOGRAM  01/29/2018  . INFLUENZA VACCINE  04/10/2018  . DEXA SCAN  Completed    Physical Exam: Vitals:   06/19/18 1110  BP: 138/70  Pulse: 67  Temp: 97.6 F (36.4 C)  TempSrc: Oral  SpO2: 97%  Weight: 172 lb (78 kg)  Height: 5\' 3"  (1.6 m)   Body mass index is 30.47 kg/m. Physical Exam  Constitutional: She appears well-developed and well-nourished. No distress.  HENT:  Head: Normocephalic and atraumatic.  Eyes:  Glasses; not seeing well peripherally  Cardiovascular: Normal rate, regular rhythm, normal heart sounds and intact distal pulses.  Chronic nonpitting edema bilateral feet and ankles  Pulmonary/Chest: Effort normal and breath sounds normal.  Abdominal: Soft. Bowel sounds are normal.  Musculoskeletal: Normal range of motion.  Very unsteady walking with cane and leaning on wall or furniture  Neurological: She is alert.  Repeating the same questions several times  Skin: Skin is warm and dry. Capillary refill takes less than 2 seconds.  Psychiatric: She has a normal mood and affect.    Labs reviewed: Basic Metabolic Panel: Recent Labs    11/11/17 1244 01/03/18 1043 01/10/18 1443  NA 142 137 141  K 4.2 3.6 3.1*  CL 104 104 103  CO2 31 23 30   GLUCOSE 99 100* 112*  BUN 13 11 9   CREATININE 0.73 0.71 0.68  CALCIUM 9.1 8.6* 9.0   Liver Function Tests: Recent Labs    07/01/17 1035 01/03/18 1043  AST 18 31  ALT 15 21  ALKPHOS  --  41  BILITOT 0.6 0.6  PROT 6.4 6.5  ALBUMIN  --  3.6   No results for input(s): LIPASE, AMYLASE in the last  8760 hours. No results for input(s): AMMONIA in the last 8760 hours. CBC: Recent Labs    11/11/17 1244 01/03/18 1043 01/10/18 1443  WBC 6.2 4.4 4.5  NEUTROABS 3,509 2.5 2.0  HGB 12.7 12.7 12.2  HCT 36.8 38.6 37.1  MCV 90.4 93.5 92.1  PLT 192 122* 179   Lipid Panel: Recent Labs    07/01/17 1035  CHOL 265*  HDL 93  LDLCALC 154*  TRIG 75  CHOLHDL 2.8   Lab Results  Component Value Date   HGBA1C 5.3 07/01/2017   Assessment/Plan 1. MCI (mild cognitive impairment) with memory loss -getting worse -fortunately nephew and his wife are stepping in to help with some things, but pt is very resistant to help and thinks she can function independently but her recall is very short-term -she refuses to use her walker consistently  2. Musculoskeletal neck pain - tylenol not helping, will try celebrex in hopes she does not have the side effects she had with etodolac - celecoxib (CELEBREX) 100 MG capsule; Take 1 capsule (100 mg total) by mouth daily.  Dispense: 30 capsule; Refill: 3  3. Debility -ongoing, needs to use rollator walker for balance and safety  4. Chronic pain of left knee - better, but now aching in neck and shoulders and cannot afford massage - celecoxib (CELEBREX) 100 MG capsule; Take 1 capsule (100 mg total) by mouth daily.  Dispense: 30 capsule; Refill: 3  5. Multiple falls -due to not using proper assistive device and holding onto furniture -has ramp now that she can use a rollator on easily so is to switch to rollator use outside of her home for sure  6. Need for influenza vaccination - Flu vaccine HIGH DOSE PF (Fluzone High dose)  Labs/tests ordered:   Orders Placed This Encounter  Procedures  . Flu vaccine HIGH DOSE PF (Fluzone High dose)   Next appt:  07/11/2018   Bibiana Gillean L. Rilie Glanz, D.O. [facility] [address], [place] [address] Cell Phone (Mon-Fri 8am-5pm):  [phone]  On Call:  [phone] &  follow prompts after 5pm & weekends Office Phone:  [phone] Office Fax:  [phone]

## 2018-06-23 ENCOUNTER — Other Ambulatory Visit: Payer: Self-pay | Admitting: Internal Medicine

## 2018-06-23 DIAGNOSIS — E039 Hypothyroidism, unspecified: Secondary | ICD-10-CM

## 2018-06-24 ENCOUNTER — Other Ambulatory Visit: Payer: Self-pay | Admitting: Internal Medicine

## 2018-07-11 ENCOUNTER — Ambulatory Visit (INDEPENDENT_AMBULATORY_CARE_PROVIDER_SITE_OTHER): Payer: Medicare Other

## 2018-07-11 VITALS — BP 124/67 | HR 57 | Temp 97.7°F | Ht 63.0 in | Wt 176.0 lb

## 2018-07-11 DIAGNOSIS — Z23 Encounter for immunization: Secondary | ICD-10-CM

## 2018-07-11 DIAGNOSIS — Z0000 Encounter for general adult medical examination without abnormal findings: Secondary | ICD-10-CM | POA: Diagnosis not present

## 2018-07-11 MED ORDER — TETANUS-DIPHTH-ACELL PERTUSSIS 5-2.5-18.5 LF-MCG/0.5 IM SUSP: 0.5000 mL | Freq: Once | INTRAMUSCULAR | 0 refills | Status: AC

## 2018-07-11 NOTE — Patient Instructions (Addendum)
Tonya Russell , Thank you for taking time to come for your Medicare Wellness Visit. I appreciate your ongoing commitment to your health goals. Please review the following plan we discussed and let me know if I can assist you in the future.   Screening recommendations/referrals: Colonoscopy excluded, over age 82 Mammogram excluded, over age 63 Bone Density up to date Recommended yearly ophthalmology/optometry visit for glaucoma screening and checkup Recommended yearly dental visit for hygiene and checkup  Vaccinations: Influenza vaccine up to date Pneumococcal vaccine 13 due, given Tdap vaccine due, ordered to pharmacy Shingles vaccine up to date, completed    Advanced directives: In chart  Conditions/risks identified: Fall risk  Next appointment: Dr. Mariea Clonts 10/20/2018 @ 10:30am                       Tyson Dense, RN 07/13/2019 @ 11:30am   Preventive Care 65 Years and Older, Female Preventive care refers to lifestyle choices and visits with your health care provider that can promote health and wellness. What does preventive care include?  A yearly physical exam. This is also called an annual well check.  Dental exams once or twice a year.  Routine eye exams. Ask your health care provider how often you should have your eyes checked.  Personal lifestyle choices, including:  Daily care of your teeth and gums.  Regular physical activity.  Eating a healthy diet.  Avoiding tobacco and drug use.  Limiting alcohol use.  Practicing safe sex.  Taking low-dose aspirin every day.  Taking vitamin and mineral supplements as recommended by your health care provider. What happens during an annual well check? The services and screenings done by your health care provider during your annual well check will depend on your age, overall health, lifestyle risk factors, and family history of disease. Counseling  Your health care provider may ask you questions about your:  Alcohol  use.  Tobacco use.  Drug use.  Emotional well-being.  Home and relationship well-being.  Sexual activity.  Eating habits.  History of falls.  Memory and ability to understand (cognition).  Work and work Statistician.  Reproductive health. Screening  You may have the following tests or measurements:  Height, weight, and BMI.  Blood pressure.  Lipid and cholesterol levels. These may be checked every 5 years, or more frequently if you are over 41 years old.  Skin check.  Lung cancer screening. You may have this screening every year starting at age 57 if you have a 30-pack-year history of smoking and currently smoke or have quit within the past 15 years.  Fecal occult blood test (FOBT) of the stool. You may have this test every year starting at age 74.  Flexible sigmoidoscopy or colonoscopy. You may have a sigmoidoscopy every 5 years or a colonoscopy every 10 years starting at age 29.  Hepatitis C blood test.  Hepatitis B blood test.  Sexually transmitted disease (STD) testing.  Diabetes screening. This is done by checking your blood sugar (glucose) after you have not eaten for a while (fasting). You may have this done every 1-3 years.  Bone density scan. This is done to screen for osteoporosis. You may have this done starting at age 62.  Mammogram. This may be done every 1-2 years. Talk to your health care provider about how often you should have regular mammograms. Talk with your health care provider about your test results, treatment options, and if necessary, the need for more tests. Vaccines  Your  health care provider may recommend certain vaccines, such as:  Influenza vaccine. This is recommended every year.  Tetanus, diphtheria, and acellular pertussis (Tdap, Td) vaccine. You may need a Td booster every 10 years.  Zoster vaccine. You may need this after age 94.  Pneumococcal 13-valent conjugate (PCV13) vaccine. One dose is recommended after age  42.  Pneumococcal polysaccharide (PPSV23) vaccine. One dose is recommended after age 61. Talk to your health care provider about which screenings and vaccines you need and how often you need them. This information is not intended to replace advice given to you by your health care provider. Make sure you discuss any questions you have with your health care provider. Document Released: 09/23/2015 Document Revised: 05/16/2016 Document Reviewed: 06/28/2015 Elsevier Interactive Patient Education  2017 [place] Prevention in the Home Falls can cause injuries. They can happen to people of all ages. There are many things you can do to make your home safe and to help prevent falls. What can I do on the outside of my home?  Regularly fix the edges of walkways and driveways and fix any cracks.  Remove anything that might make you trip as you walk through a door, such as a raised step or threshold.  Trim any bushes or trees on the path to your home.  Use bright outdoor lighting.  Clear any walking paths of anything that might make someone trip, such as rocks or tools.  Regularly check to see if handrails are loose or broken. Make sure that both sides of any steps have handrails.  Any raised decks and porches should have guardrails on the edges.  Have any leaves, snow, or ice cleared regularly.  Use sand or salt on walking paths during winter.  Clean up any spills in your garage right away. This includes oil or grease spills. What can I do in the bathroom?  Use night lights.  Install grab bars by the toilet and in the tub and shower. Do not use towel bars as grab bars.  Use non-skid mats or decals in the tub or shower.  If you need to sit down in the shower, use a plastic, non-slip stool.  Keep the floor dry. Clean up any water that spills on the floor as soon as it happens.  Remove soap buildup in the tub or shower regularly.  Attach bath mats securely with double-sided  non-slip rug tape.  Do not have throw rugs and other things on the floor that can make you trip. What can I do in the bedroom?  Use night lights.  Make sure that you have a light by your bed that is easy to reach.  Do not use any sheets or blankets that are too big for your bed. They should not hang down onto the floor.  Have a firm chair that has side arms. You can use this for support while you get dressed.  Do not have throw rugs and other things on the floor that can make you trip. What can I do in the kitchen?  Clean up any spills right away.  Avoid walking on wet floors.  Keep items that you use a lot in easy-to-reach places.  If you need to reach something above you, use a strong step stool that has a grab bar.  Keep electrical cords out of the way.  Do not use floor polish or wax that makes floors slippery. If you must use wax, use non-skid floor wax.  Do not  have throw rugs and other things on the floor that can make you trip. What can I do with my stairs?  Do not leave any items on the stairs.  Make sure that there are handrails on both sides of the stairs and use them. Fix handrails that are broken or loose. Make sure that handrails are as long as the stairways.  Check any carpeting to make sure that it is firmly attached to the stairs. Fix any carpet that is loose or worn.  Avoid having throw rugs at the top or bottom of the stairs. If you do have throw rugs, attach them to the floor with carpet tape.  Make sure that you have a light switch at the top of the stairs and the bottom of the stairs. If you do not have them, ask someone to add them for you. What else can I do to help prevent falls?  Wear shoes that:  Do not have high heels.  Have rubber bottoms.  Are comfortable and fit you well.  Are closed at the toe. Do not wear sandals.  If you use a stepladder:  Make sure that it is fully opened. Do not climb a closed stepladder.  Make sure that both  sides of the stepladder are locked into place.  Ask someone to hold it for you, if possible.  Clearly mark and make sure that you can see:  Any grab bars or handrails.  First and last steps.  Where the edge of each step is.  Use tools that help you move around (mobility aids) if they are needed. These include:  Canes.  Walkers.  Scooters.  Crutches.  Turn on the lights when you go into a dark area. Replace any light bulbs as soon as they burn out.  Set up your furniture so you have a clear path. Avoid moving your furniture around.  If any of your floors are uneven, fix them.  If there are any pets around you, be aware of where they are.  Review your medicines with your doctor. Some medicines can make you feel dizzy. This can increase your chance of falling. Ask your doctor what other things that you can do to help prevent falls. This information is not intended to replace advice given to you by your health care provider. Make sure you discuss any questions you have with your health care provider. Document Released: 06/23/2009 Document Revised: 02/02/2016 Document Reviewed: 10/01/2014 Elsevier Interactive Patient Education  2017 [employer].

## 2018-07-11 NOTE — Progress Notes (Signed)
Subjective:   Tonya Russell is a 84 y.o. female who presents for Medicare Annual (Subsequent) preventive examination.  Last AWV-07/08/2017  Objective:     Vitals: BP 124/67 (BP Location: Left Arm, Patient Position: Sitting)   Pulse (!) 57   Temp 97.7 F (36.5 C) (Oral)   Ht 5\' 3"  (1.6 m)   Wt 176 lb (79.8 kg)   SpO2 95%   BMI 31.18 kg/m   Body mass index is 31.18 kg/m.  Advanced Directives 07/11/2018 04/28/2018 03/12/2018 02/06/2018 01/07/2018 11/11/2017 07/26/2017  Does Patient Have a Medical Advance Directive? Yes Yes Yes Yes Yes Yes Yes  Type of Paramedic of [place];Living will;Out of facility DNR (pink MOST or yellow form) [place];Living will;Out of facility DNR (pink MOST or yellow form) [place];Living will;Out of facility DNR (pink MOST or yellow form) Living will;Out of facility DNR (pink MOST or yellow form) Healthcare Power of [place] of facility DNR (pink MOST or yellow form);Living will Living will;Out of facility DNR (pink MOST or yellow form)  Does patient want to make changes to medical advance directive? No - Patient declined - - No - Patient declined - No - Patient declined No - Patient declined  Copy of [place] in Chart? Yes Yes Yes - Yes - -  Pre-existing out of facility DNR order (yellow form or pink MOST form) Yellow form placed in chart (order not valid for inpatient use) - Yellow form placed in chart (order not valid for inpatient use) Yellow form placed in chart (order not valid for inpatient use) Yellow form placed in chart (order not valid for inpatient use) Yellow form placed in chart (order not valid for inpatient use) -    Tobacco Social History   Tobacco Use  Smoking Status Never Smoker  Smokeless Tobacco Never Used     Counseling given: Not Answered   Clinical Intake:  Pre-visit preparation completed: No  Pain : No/denies pain     Diabetes: No  How  often do you need to have someone help you when you read instructions, pamphlets, or other written materials from your doctor or pharmacy?: 1 - Never What is the last grade level you completed in school?: HS  Interpreter Needed?: No  Information entered by :: Tyson Dense, RN  Past Medical History:  Diagnosis Date  . Abdominal pain, epigastric   . Allergic rhinitis due to other allergen   . Allergic rhinitis, cause unspecified   . Dermatophytosis of nail   . Diffuse cystic mastopathy   . Diverticulosis of colon (without mention of hemorrhage)   . DVT (deep venous thrombosis) ([place])   . Dysphagia, unspecified(787.20)   . Esophageal reflux   . Family history of malignant neoplasm of gastrointestinal tract   . Hiatal hernia   . Hyperlipidemia   . Hypertension   . Internal hemorrhoids without mention of complication   . 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 Obesity   . Oral aphthae   . Osteoarthrosis, unspecified whether generalized or localized, unspecified site   . Other abnormal blood chemistry   . Other voice and resonance disorders   . Pain in joint, pelvic region and thigh   . Palpitations   . Pancreatitis   . Peripheral vascular disease, unspecified ([place])   . Schatzki's ring   . Senile osteoporosis   . Torus fracture of fibula   . Unspecified constipation   . Unspecified hypothyroidism  Past Surgical History:  Procedure Laterality Date  . APPENDECTOMY    . BASAL CELL CARCINOMA EXCISION     right humerus, right temple, left side of nose  . BASAL CELL CARCINOMA EXCISION     chest, left cheek  . CATARACT EXTRACTION, BILATERAL    . CHOLECYSTECTOMY    . keratosis     left arm  . resection of ampullary tumor    . squamous cell cancer     Right leg  . squamous cell cancer left triceps Left    Dr. Lorenza Cambridge  . TONSILLECTOMY AND ADENOIDECTOMY     Family History  Problem Relation Age of Onset  . Cancer Mother        spindle cell carcinoma on neck  .  Colon cancer Father 56  . Breast cancer Sister   . Colon polyps Sister   . Heart disease Brother   . Breast cancer Unknown        neiece x 3  . Esophageal cancer Neg Hx   . Pancreatic cancer Neg Hx   . Kidney disease Neg Hx   . Liver disease Neg Hx    Social History   Socioeconomic History  . Marital status: Single    Spouse name: Not on file  . Number of children: Not on file  . Years of education: Not on file  . Highest education level: Not on file  Occupational History  . Occupation: Retired  Scientific laboratory technician  . Financial resource strain: Not hard at all  . Food insecurity:    Worry: Never true    Inability: Never true  . Transportation needs:    Medical: No    Non-medical: No  Tobacco Use  . Smoking status: Never Smoker  . Smokeless tobacco: Never Used  Substance and Sexual Activity  . Alcohol use: No  . Drug use: No  . Sexual activity: Never  Lifestyle  . Physical activity:    Days per week: 0 days    Minutes per session: 0 min  . Stress: Only a little  Relationships  . Social connections:    Talks on phone: More than three times a week    Gets together: Twice a week    Attends religious service: Never    Active member of club or organization: No    Attends meetings of clubs or organizations: Never    Relationship status: Never married  Other Topics Concern  . Not on file  Social History Narrative  . Not on file    Outpatient Encounter Medications as of 07/11/2018  Medication Sig  . alendronate (FOSAMAX) 70 MG tablet TAKE 1 TABLET BY MOUTH EVERY SEVEN DAYS WITH A FULL GLASS OF WATER ON AN EMPTY STOMACH.  Marland Kitchen aspirin EC 81 MG tablet Take 81 mg by mouth daily.  . bimatoprost (LUMIGAN) 0.01 % SOLN Place 1 drop into both eyes at bedtime.  . celecoxib (CELEBREX) 100 MG capsule Take 1 capsule (100 mg total) by mouth daily.  . DULoxetine (CYMBALTA) 30 MG capsule TAKE 1 CAPSULE EVERY DAY  . fluticasone (FLONASE) 50 MCG/ACT nasal spray Place 1 spray into both  nostrils daily as needed.   . gabapentin (NEURONTIN) 300 MG capsule Take 1 capsule (300 mg total) by mouth at bedtime.  . Inulin (FIBER CHOICE PO) Take 5 g by mouth 2 (two) times daily.  Marland Kitchen levothyroxine (SYNTHROID, LEVOTHROID) 50 MCG tablet TAKE 1 TABLET DAILY FOR THYROID SUPPLEMENT  . Menthol, Topical Analgesic, (BIOFREEZE) 4 %  GEL Apply 1 application topically daily as needed (Knee pain).  . Multiple Vitamin (MULTIVITAMIN WITH MINERALS) TABS tablet Take 1 tablet by mouth daily.  . Omega-3 Fatty Acids (FISH OIL) 1200 MG CAPS Take 1,200 mg by mouth 3 (three) times daily.   Marland Kitchen omeprazole (PRILOSEC) 20 MG capsule TAKE 1 CAPSULE DAILY TO REDUCE STOMACH ACID.  Marland Kitchen Polyethyl Glycol-Propyl Glycol (SYSTANE ULTRA OP) Place 1 drop into both eyes as needed (dry eyes).   . Tdap (BOOSTRIX) 5-2.5-18.5 LF-MCG/0.5 injection Inject 0.5 mLs into the muscle once for 1 dose.  Marland Kitchen UNABLE TO FIND Take 1 tablet daily by mouth. Bone Care: Calcium 1000mg , magnesium, vitamins D3 2000 units and K: Take one tablet by mouth every evening.  . [DISCONTINUED] Tdap (BOOSTRIX) 5-2.5-18.5 LF-MCG/0.5 injection Inject 0.5 mLs into the muscle once.   No facility-administered encounter medications on file as of 07/11/2018.     Activities of Daily Living In your present state of health, do you have any difficulty performing the following activities: 07/11/2018  Hearing? N  Vision? N  Difficulty concentrating or making decisions? N  Walking or climbing stairs? Y  Dressing or bathing? N  Doing errands, shopping? N  Preparing Food and eating ? N  Using the Toilet? N  In the past six months, have you accidently leaked urine? Y  Do you have problems with loss of bowel control? N  Managing your Medications? N  Managing your Finances? N  Housekeeping or managing your Housekeeping? N  Some recent data might be hidden    Patient Care Team: Gayland Curry, DO as PCP - General (Geriatric Medicine) [name], MD as Consulting  Physician (Gastroenterology) Lelon Perla, MD as Consulting Physician (Cardiology) Druscilla Brownie, MD as Consulting Physician (Dermatology) [name], MD as Consulting Physician (Ophthalmology)    Assessment:   This is a routine wellness examination for [facility].  Exercise Activities and Dietary recommendations Current Exercise Habits: The patient does not participate in regular exercise at present, Exercise limited by: orthopedic condition(s)  Goals    . Exercise More     Starting 07/04/16, I will attempt to exercise at least twice a week, walking or using my pedaling machine.     . Maintain Lifestyle     Starting today pt will maintain lifestyle.        Fall Risk Fall Risk  07/11/2018 06/19/2018 04/28/2018 03/12/2018 02/06/2018  Falls in the past year? 1 No Yes Yes No  Number falls in past yr: 0 - 2 or more 2 or more -  Injury with Fall? 1 - Yes No -  Comment - - - - -  Risk Factor Category  - - - - -  Risk for fall due to : History of fall(s) - - - -  Follow up Falls prevention discussed - - - -   Is the patient's home free of loose throw rugs in walkways, pet beds, electrical cords, etc?   yes      Grab bars in the bathroom? yes      Handrails on the stairs?   yes      Adequate lighting?   yes  Timed Get Up and Go performed: 22 seconds  Depression Screen PHQ 2/9 Scores 07/11/2018 06/19/2018 02/06/2018 11/11/2017  PHQ - 2 Score 0 0 0 0     Cognitive Function completed within the last year MMSE - Mini Mental State Exam 01/07/2018 03/27/2017 03/27/2017 07/04/2016  Orientation to time 2 5 5  5  Orientation to Place 5 5 5 5   Registration 3 3 3 3   Attention/ Calculation 5 5 5 5   Recall 2 3 3 3   Language- name 2 objects 2 2 2 2   Language- repeat 1 1 1 1   Language- follow 3 step command 3 3 3 3   Language- read & follow direction 1 1 1 1   Write a sentence 1 1 1 1   Copy design 0 1 1 1   Total score 25 30 30 30         Immunization History  Administered Date(s)  Administered  . Influenza, High Dose Seasonal PF 05/09/2017, 06/19/2018  . Influenza,inj,Quad PF,6+ Mos 06/17/2013, 06/23/2014, 09/07/2015, 07/04/2016  . Influenza-Unspecified 07/09/2012  . Pneumococcal Conjugate-13 07/11/2018  . Pneumococcal Polysaccharide-23 07/04/2016  . Pneumococcal-Unspecified 09/10/1992  . Tdap 09/11/2007  . Zoster 11/28/2010  . Zoster Recombinat (Shingrix) 07/09/2017, 10/23/2017    Qualifies for Shingles Vaccine? Up to date, completed  Screening Tests Health Maintenance  Topic Date Due  . TETANUS/TDAP  09/10/2017  . MAMMOGRAM  01/29/2018  . INFLUENZA VACCINE  Completed  . DEXA SCAN  Completed  . PNA vac Low Risk Adult  Completed    Cancer Screenings: Lung: Low Dose CT Chest recommended if Age 22-80 years, 30 pack-year currently smoking OR have quit w/in 15years. Patient does not qualify. Breast:  Up to date on Mammogram? Yes   Up to date of Bone Density/Dexa? Yes Colorectal: up to date  Additional Screenings:  Hepatitis C Screening: declined Prevnar due: given today TDAP due: ordered to pharmacy     Plan:    I have personally reviewed and addressed the Medicare Annual Wellness questionnaire and have noted the following in the patient's chart:  A. Medical and social history B. Use of alcohol, tobacco or illicit drugs  C. Current medications and supplements D. Functional ability and status E.  Nutritional status F.  Physical activity G. Advance directives H. List of other physicians I.  Hospitalizations, surgeries, and ER visits in previous 12 months J.  [place] to include hearing, vision, cognitive, depression L. Referrals and appointments - none  In addition, I have reviewed and discussed with patient certain preventive protocols, quality metrics, and best practice recommendations. A written personalized care plan for preventive services as well as general preventive health recommendations were provided to patient.  See  attached scanned questionnaire for additional information.   Signed,   Tyson Dense, RN Nurse Health Advisor  Patient Concerns: Pain in Right hip will wake her up in the middle of the night

## 2018-07-14 ENCOUNTER — Ambulatory Visit: Payer: Self-pay

## 2018-07-21 ENCOUNTER — Other Ambulatory Visit: Payer: Self-pay | Admitting: Internal Medicine

## 2018-07-21 DIAGNOSIS — F419 Anxiety disorder, unspecified: Secondary | ICD-10-CM

## 2018-07-29 ENCOUNTER — Other Ambulatory Visit: Payer: Self-pay

## 2018-07-29 DIAGNOSIS — M25562 Pain in left knee: Secondary | ICD-10-CM

## 2018-07-29 DIAGNOSIS — M542 Cervicalgia: Secondary | ICD-10-CM

## 2018-07-29 DIAGNOSIS — G8929 Other chronic pain: Secondary | ICD-10-CM

## 2018-07-29 MED ORDER — CELECOXIB 100 MG PO CAPS
100.0000 mg | ORAL_CAPSULE | Freq: Every day | ORAL | 1 refills | Status: DC
Start: 2018-07-29 — End: 2018-10-06

## 2018-08-06 ENCOUNTER — Encounter: Payer: Self-pay | Admitting: Internal Medicine

## 2018-09-08 ENCOUNTER — Other Ambulatory Visit: Payer: Self-pay | Admitting: *Deleted

## 2018-09-08 MED ORDER — ALENDRONATE SODIUM 70 MG PO TABS: 70.0000 mg | ORAL_TABLET | ORAL | 3 refills | Status: DC

## 2018-10-06 ENCOUNTER — Telehealth: Payer: Self-pay

## 2018-10-06 DIAGNOSIS — M542 Cervicalgia: Secondary | ICD-10-CM

## 2018-10-06 DIAGNOSIS — G8929 Other chronic pain: Secondary | ICD-10-CM

## 2018-10-06 DIAGNOSIS — M25562 Pain in left knee: Secondary | ICD-10-CM

## 2018-10-06 DIAGNOSIS — E039 Hypothyroidism, unspecified: Secondary | ICD-10-CM

## 2018-10-06 DIAGNOSIS — F419 Anxiety disorder, unspecified: Secondary | ICD-10-CM

## 2018-10-06 MED ORDER — ALENDRONATE SODIUM 70 MG PO TABS: 70.0000 mg | ORAL_TABLET | ORAL | 3 refills | Status: DC

## 2018-10-06 MED ORDER — LEVOTHYROXINE SODIUM 50 MCG PO TABS: ORAL_TABLET | ORAL | 1 refills | Status: DC

## 2018-10-06 MED ORDER — OMEPRAZOLE 20 MG PO CPDR: DELAYED_RELEASE_CAPSULE | ORAL | 1 refills | Status: DC

## 2018-10-06 MED ORDER — CELECOXIB 100 MG PO CAPS: 100.0000 mg | ORAL_CAPSULE | Freq: Every day | ORAL | 1 refills | Status: DC

## 2018-10-06 MED ORDER — DULOXETINE HCL 30 MG PO CPEP: 30.0000 mg | ORAL_CAPSULE | Freq: Every day | ORAL | 1 refills | Status: DC

## 2018-10-06 NOTE — Telephone Encounter (Signed)
Patient called to say she is no longer with [facility] Mail order. Patient needs all rx's sent to [facility] order  5 rx's sent as requested

## 2018-10-07 DIAGNOSIS — L570 Actinic keratosis: Secondary | ICD-10-CM | POA: Diagnosis not present

## 2018-10-07 DIAGNOSIS — R202 Paresthesia of skin: Secondary | ICD-10-CM | POA: Diagnosis not present

## 2018-10-15 DIAGNOSIS — H04123 Dry eye syndrome of bilateral lacrimal glands: Secondary | ICD-10-CM | POA: Diagnosis not present

## 2018-10-15 DIAGNOSIS — H1859 Other hereditary corneal dystrophies: Secondary | ICD-10-CM | POA: Diagnosis not present

## 2018-10-15 DIAGNOSIS — H52203 Unspecified astigmatism, bilateral: Secondary | ICD-10-CM | POA: Diagnosis not present

## 2018-10-15 DIAGNOSIS — H401134 Primary open-angle glaucoma, bilateral, indeterminate stage: Secondary | ICD-10-CM | POA: Diagnosis not present

## 2018-10-20 ENCOUNTER — Ambulatory Visit: Payer: Medicare Other | Admitting: Internal Medicine

## 2018-10-23 ENCOUNTER — Ambulatory Visit: Payer: Medicare Other | Admitting: Internal Medicine

## 2018-10-29 ENCOUNTER — Telehealth: Payer: Self-pay | Admitting: Internal Medicine

## 2018-10-29 NOTE — Telephone Encounter (Signed)
The patient's nephew called me to ask if she should be fasting for her labs tomorrow.  I checked the last office visit notes, but it didn't specify.  I told him that I would send a message and ask someone to call him and let him know. VDM (DD)

## 2018-10-29 NOTE — Telephone Encounter (Signed)
I spoke with [name] and let him know that Ms. Tonya Russell doesn't need to fast for tomorrow's labs. VDM (DD)

## 2018-10-29 NOTE — Telephone Encounter (Signed)
[name] [phone]

## 2018-10-29 NOTE — Telephone Encounter (Signed)
I am at [facility] today, pt has an appointment with Dr. Mariea Clonts and will get labs after that visit at 1pm, so she doesn't need to fast.

## 2018-10-30 ENCOUNTER — Encounter: Payer: Self-pay | Admitting: Internal Medicine

## 2018-10-30 ENCOUNTER — Ambulatory Visit (INDEPENDENT_AMBULATORY_CARE_PROVIDER_SITE_OTHER): Payer: Medicare Other | Admitting: Internal Medicine

## 2018-10-30 VITALS — BP 122/70 | HR 60 | Temp 97.7°F | Resp 10 | Ht 63.0 in | Wt 183.4 lb

## 2018-10-30 DIAGNOSIS — H1859 Other hereditary corneal dystrophies: Secondary | ICD-10-CM

## 2018-10-30 DIAGNOSIS — G3184 Mild cognitive impairment, so stated: Secondary | ICD-10-CM | POA: Insufficient documentation

## 2018-10-30 DIAGNOSIS — I739 Peripheral vascular disease, unspecified: Secondary | ICD-10-CM | POA: Diagnosis not present

## 2018-10-30 DIAGNOSIS — H18529 Epithelial (juvenile) corneal dystrophy, unspecified eye: Secondary | ICD-10-CM | POA: Insufficient documentation

## 2018-10-30 DIAGNOSIS — E039 Hypothyroidism, unspecified: Secondary | ICD-10-CM

## 2018-10-30 DIAGNOSIS — M1712 Unilateral primary osteoarthritis, left knee: Secondary | ICD-10-CM | POA: Diagnosis not present

## 2018-10-30 MED ORDER — ACETAMINOPHEN 500 MG PO TABS: 1000.0000 mg | ORAL_TABLET | Freq: Two times a day (BID) | ORAL | 3 refills | Status: DC

## 2018-10-30 NOTE — Progress Notes (Signed)
Location:  [facility] clinic Provider:  Zylon Creamer L. Mariea Clonts, D.O., C.M.D.   Goals of Care:  Advanced Directives 10/30/2018  Does Patient Have a Medical Advance Directive? Yes  Type of Paramedic of [place];Living will;Out of facility DNR (pink MOST or yellow form)  Does patient want to make changes to medical advance directive? Yes (MAU/Ambulatory/Procedural Areas - Information given)  Copy of [place] in Chart? Yes - validated most recent copy scanned in chart (See row information)  Pre-existing out of facility DNR order (yellow form or pink MOST form) -     Chief Complaint  Patient presents with  . Medical Management of Chronic Issues    4 month follow-up, patient c/o joint pain and questions if related to medication. Here with friend, Guerry Minors  . Best Practice Recommendations    Refused future mammograms     HPI: Patient is a 85 y.o. female seen today for medical management of chronic diseases.    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  Her ophthalmologist advised against an eye surgery.  Sees Dr. Satira Sark.  He increased her systane drops for dry eyes and recommended warm compresses.  She had her derm appt and had some treatments done.    Her left knee has gotten much worse and limits her mobility.  Bilateral hips ache in the morning.  Takes her celebrex daily.  Dr. Noemi Chapel put shots in her knee before for her arthritis.    Past Medical History:  Diagnosis Date  . Abdominal pain, epigastric   . Allergic rhinitis due to other allergen   . Allergic rhinitis, cause unspecified   . Dermatophytosis of nail   . Diffuse cystic mastopathy   . Diverticulosis of colon (without mention of hemorrhage)   . DVT (deep venous thrombosis) ([place])   . Dysphagia, unspecified(787.20)   . Esophageal reflux   . Family history of malignant neoplasm of gastrointestinal tract   . Hiatal hernia   . Hyperlipidemia   . Hypertension     . Internal hemorrhoids without mention of complication   . 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 Obesity   . Oral aphthae   . Osteoarthrosis, unspecified whether generalized or localized, unspecified site   . Other abnormal blood chemistry   . Other voice and resonance disorders   . Pain in joint, pelvic region and thigh   . Palpitations   . Pancreatitis   . Peripheral vascular disease, unspecified ([place])   . Schatzki's ring   . Senile osteoporosis   . Torus fracture of fibula   . Unspecified constipation   . Unspecified hypothyroidism     Past Surgical History:  Procedure Laterality Date  . APPENDECTOMY    . BASAL CELL CARCINOMA EXCISION     right humerus, right temple, left side of nose  . BASAL CELL CARCINOMA EXCISION     chest, left cheek  . CATARACT EXTRACTION, BILATERAL    . CHOLECYSTECTOMY    . keratosis     left arm  . resection of ampullary tumor    . squamous cell cancer     Right leg  . squamous cell cancer left triceps Left    Dr. Lorenza Cambridge  . TONSILLECTOMY AND ADENOIDECTOMY      Allergies  Allergen Reactions  . Penicillins     Has patient had a PCN reaction causing immediate rash, facial/tongue/throat swelling, SOB or lightheadedness with hypotension: yes Has patient  had a PCN reaction causing severe rash involving mucus membranes or skin necrosis: no Has patient had a PCN reaction that required hospitalization: no Has patient had a PCN reaction occurring within the last 10 years: no If all of the above answers are "NO", then may proceed with Cephalosporin use.  . Codeine Nausea And Vomiting  . Lodine [Etodolac] Nausea And Vomiting    Outpatient Encounter Medications as of 10/30/2018  Medication Sig  . alendronate (FOSAMAX) 70 MG tablet Take 1 tablet (70 mg total) by mouth once a week. Take with a full glass of water on an empty stomach.  Marland Kitchen aspirin EC 81 MG tablet Take 81 mg by mouth daily.  . bimatoprost (LUMIGAN) 0.01 % SOLN Place 1 drop  into both eyes at bedtime.  . Calcium Carb-Cholecalciferol (CALCIUM 600+D3 PO) Take by mouth. 2 by mouth daily  . celecoxib (CELEBREX) 100 MG capsule Take 1 capsule (100 mg total) by mouth daily.  . Cholecalciferol (VITAMIN D3) 50 MCG (2000 UT) capsule Take 2,000 Units by mouth daily.  . DULoxetine (CYMBALTA) 30 MG capsule Take 1 capsule (30 mg total) by mouth daily.  . fluticasone (FLONASE) 50 MCG/ACT nasal spray Place 1 spray into both nostrils daily as needed.   . gabapentin (NEURONTIN) 300 MG capsule Take 1 capsule (300 mg total) by mouth at bedtime.  . Inulin (FIBER CHOICE PO) Take 5 g by mouth as needed.   Marland Kitchen levothyroxine (SYNTHROID, LEVOTHROID) 50 MCG tablet TAKE 1 TABLET DAILY FOR THYROID SUPPLEMENT  . Menthol, Topical Analgesic, (BIOFREEZE) 4 % GEL Apply 1 application topically daily as needed (Knee pain).  . Multiple Vitamin (MULTIVITAMIN WITH MINERALS) TABS tablet Take 1 tablet by mouth daily.  . Omega-3 Fatty Acids (FISH OIL) 1200 MG CAPS Take 1,200 mg by mouth 3 (three) times daily.   Marland Kitchen omeprazole (PRILOSEC) 20 MG capsule TAKE 1 CAPSULE DAILY TO REDUCE STOMACH ACID.  Marland Kitchen Polyethyl Glycol-Propyl Glycol (SYSTANE ULTRA OP) Place 1 drop into both eyes 3 (three) times daily as needed (dry eyes).   Marland Kitchen acetaminophen (TYLENOL) 500 MG tablet Take 2 tablets (1,000 mg total) by mouth 2 (two) times daily.  . [DISCONTINUED] UNABLE TO FIND Take 1 tablet daily by mouth. Bone Care: Calcium 1000mg , magnesium, vitamins D3 2000 units and K: Take one tablet by mouth every evening.   No facility-administered encounter medications on file as of 10/30/2018.     Review of Systems:  Review of Systems  Constitutional: Negative for chills, fever and malaise/fatigue.  HENT: Positive for hearing loss.   Eyes: Positive for blurred vision.  Respiratory: Negative for cough and shortness of breath.   Cardiovascular: Positive for leg swelling. Negative for chest pain.  Gastrointestinal: Negative for blood in  stool, constipation and melena.  Genitourinary: Negative for dysuria.  Musculoskeletal: Positive for joint pain. Negative for falls.  Skin: Positive for itching and rash.  Neurological: Negative for dizziness, loss of consciousness and headaches.  Endo/Heme/Allergies: Bruises/bleeds easily.  Psychiatric/Behavioral: Positive for memory loss. Negative for depression. The patient is not nervous/anxious.     Health Maintenance  Topic Date Due  . TETANUS/TDAP  07/11/2028  . INFLUENZA VACCINE  Completed  . DEXA SCAN  Completed  . PNA vac Low Risk Adult  Completed    Physical Exam: Vitals:   10/30/18 1254  BP: 122/70  Pulse: 60  Resp: 10  Temp: 97.7 F (36.5 C)  TempSrc: Oral  Weight: 183 lb 6.4 oz (83.2 kg)  Height: 5\' 3"  (1.6  m)   Body mass index is 32.49 kg/m. Physical Exam Constitutional:      General: She is not in acute distress.    Appearance: Normal appearance. She is normal weight. She is not toxic-appearing.  HENT:     Head: Normocephalic and atraumatic.     Right Ear: There is no impacted cerumen.     Left Ear: There is no impacted cerumen.  Cardiovascular:     Rate and Rhythm: Normal rate and regular rhythm.     Pulses: Normal pulses.     Heart sounds: Normal heart sounds.  Pulmonary:     Effort: Pulmonary effort is normal.     Breath sounds: Normal breath sounds.  Abdominal:     General: Bowel sounds are normal.  Musculoskeletal: Normal range of motion.     Right lower leg: Edema present.     Left lower leg: Edema present.     Comments: Tenderness of left knee and bilateral trochanters; nonpitting edema  Skin:    Findings: Bruising present.     Comments: Dry scaly skin of arms and legs  Neurological:     General: No focal deficit present.     Mental Status: She is alert and oriented to person, place, and time. Mental status is at baseline.     Cranial Nerves: No cranial nerve deficit.     Comments: Short-term memory loss, repeats questions and stories    Psychiatric:        Mood and Affect: Mood normal.     Labs reviewed: Basic Metabolic Panel: Recent Labs    11/11/17 1244 01/03/18 1043 01/10/18 1443  NA 142 137 141  K 4.2 3.6 3.1*  CL 104 104 103  CO2 31 23 30   GLUCOSE 99 100* 112*  BUN 13 11 9   CREATININE 0.73 0.71 0.68  CALCIUM 9.1 8.6* 9.0   Liver Function Tests: Recent Labs    01/03/18 1043  AST 31  ALT 21  ALKPHOS 41  BILITOT 0.6  PROT 6.5  ALBUMIN 3.6   No results for input(s): LIPASE, AMYLASE in the last 8760 hours. No results for input(s): AMMONIA in the last 8760 hours. CBC: Recent Labs    11/11/17 1244 01/03/18 1043 01/10/18 1443  WBC 6.2 4.4 4.5  NEUTROABS 3,509 2.5 2.0  HGB 12.7 12.7 12.2  HCT 36.8 38.6 37.1  MCV 90.4 93.5 92.1  PLT 192 122* 179   Lipid Panel: No results for input(s): CHOL, HDL, LDLCALC, TRIG, CHOLHDL, LDLDIRECT in the last 8760 hours. Lab Results  Component Value Date   HGBA1C 5.3 07/01/2017    Assessment/Plan 1. Primary osteoarthritis of left knee - educated again on avoiding regular use of nsaids but she is not getting relief from anything else and increasing celebrex seems too risky in pt who lives alone and has early dementia in case she gets dehydrated, routine use of walker, recommended PT, but pt refuses, cont biofreeze prn, other topicals also ok - acetaminophen (TYLENOL) 500 MG tablet; Take 2 tablets (1,000 mg total) by mouth 2 (two) times daily.  Dispense: 180 tablet; Refill: 3  2. Peripheral vascular disease, unspecified ([place]) -ongoing with various color changes of legs and feet, but pain does not seem due to this as it's localized to joints typically - COMPLETE METABOLIC PANEL WITH GFR  3. Map-dot-fingerprint corneal dystrophy -having progression of her visual decline -discussed need for in-home supports--did provide contact information for [facility] [place] to her nephew's wife who has been coming  to appts to they could assess her for more social  supports and likely get her on a wait list for a caregiver in her home--pt lives in a trailer w/o AC--nephew did build ramp for her which has helped with mobility some; ? Qualifies for meals on wheels, too  4. MCI (mild cognitive impairment) with memory loss -takes meds on her own at this point, but does repeat stories and niece corrects some information she shares  5. Hypothyroidism, unspecified type -cont levothyroxine, f/u lab - TSH  Labs/tests ordered:   Orders Placed This Encounter  Procedures  . COMPLETE METABOLIC PANEL WITH GFR  . TSH   Next appt:  02/26/2019  Soley Harriss L. [place] Folden, D.O. [facility] [address], [place] [address] Cell Phone (Mon-Fri 8am-5pm):  [phone] On Call:  [phone] & follow prompts after 5pm & weekends Office Phone:  [phone] Office Fax:  [phone]

## 2018-10-31 ENCOUNTER — Encounter: Payer: Self-pay | Admitting: *Deleted

## 2018-10-31 LAB — COMPLETE METABOLIC PANEL WITH GFR
AG Ratio: 1.7 (calc) (ref 1.0–2.5)
ALT: 14 U/L (ref 6–29)
AST: 19 U/L (ref 10–35)
Albumin: 4.3 g/dL (ref 3.6–5.1)
Alkaline phosphatase (APISO): 45 U/L (ref 37–153)
BUN: 17 mg/dL (ref 7–25)
CO2: 27 mmol/L (ref 20–32)
Calcium: 9.6 mg/dL (ref 8.6–10.4)
Chloride: 104 mmol/L (ref 98–110)
Creat: 0.75 mg/dL (ref 0.60–0.88)
GFR, Est African American: 80 mL/min/{1.73_m2} (ref >=60)
GFR, Est Non African American: 69 mL/min/{1.73_m2} (ref >=60)
Globulin: 2.5 g/dL (calc) (ref 1.9–3.7)
Glucose, Bld: 123 mg/dL — ABNORMAL HIGH (ref 65–99)
Potassium: 3.9 mmol/L (ref 3.5–5.3)
Sodium: 141 mmol/L (ref 135–146)
Total Bilirubin: 0.5 mg/dL (ref 0.2–1.2)
Total Protein: 6.8 g/dL (ref 6.1–8.1)

## 2018-10-31 LAB — TSH: TSH: 2.06 mIU/L (ref 0.40–4.50)

## 2018-11-14 ENCOUNTER — Telehealth: Payer: Self-pay

## 2018-11-14 NOTE — Telephone Encounter (Signed)
[name] called on patient's behalf to question how much tylenol patient was instructed to take at last ov. Per Dr.Reed's note patient to take Tylenol 500 mg, 2 by mouth 2 times daily.  [name] verbalized understanding. [name] is waiting for rx via mail order. [name] will pick up OTC tylenol in the meantime

## 2018-12-01 ENCOUNTER — Telehealth: Payer: Self-pay

## 2018-12-01 NOTE — Telephone Encounter (Signed)
I just gave them a printout of the phone number for [employer] [facility] [place].  We can also print out the [place] contact information from their website.  Please mail both of those to [place].  Thanks.

## 2018-12-01 NOTE — Telephone Encounter (Signed)
Patient's nephew called stating Dr.Reed provided some information at last office visit about getting some in-home assistance. Patient held onto paperwork up until a few days ago and has now misplaced. Loa Socks would like this paperwork again.   Dee and I looked for the paperwork listed in the office note and was unable to located  Dr.Reed please advise

## 2018-12-02 NOTE — Telephone Encounter (Signed)
I called Loa Socks to discuss response, Loa Socks preferred that I provide information over the phone vs mailing. I provided Loa Socks with the contact information for   [employer] [place], [place] [address] [address], [place], [place] [address] [phone]

## 2018-12-08 ENCOUNTER — Other Ambulatory Visit: Payer: Self-pay | Admitting: *Deleted

## 2018-12-08 DIAGNOSIS — M542 Cervicalgia: Secondary | ICD-10-CM

## 2018-12-08 DIAGNOSIS — G8929 Other chronic pain: Secondary | ICD-10-CM

## 2018-12-08 DIAGNOSIS — E039 Hypothyroidism, unspecified: Secondary | ICD-10-CM

## 2018-12-08 DIAGNOSIS — M25562 Pain in left knee: Secondary | ICD-10-CM

## 2018-12-08 MED ORDER — LEVOTHYROXINE SODIUM 50 MCG PO TABS: ORAL_TABLET | ORAL | 1 refills | Status: DC

## 2018-12-08 MED ORDER — OMEPRAZOLE 20 MG PO CPDR: DELAYED_RELEASE_CAPSULE | ORAL | 1 refills | Status: DC

## 2018-12-08 MED ORDER — CELECOXIB 100 MG PO CAPS: 100.0000 mg | ORAL_CAPSULE | Freq: Every day | ORAL | 1 refills | Status: DC

## 2018-12-08 NOTE — Telephone Encounter (Signed)
Patient requested refills.   Pended Celebrex and sent to Dr. Mariea Clonts for approval due to [place].

## 2018-12-19 IMAGING — CT CT HEAD W/O CM
3 series · 15 of 47 positions shown, 18 images · non-contrast
Comparison: CT scan of October 28, 2015.

CLINICAL DATA: Fall yesterday.  No loss of consciousness.

EXAM:
CT HEAD WITHOUT CONTRAST
TECHNIQUE: Contiguous axial images were obtained from the base of the skull
through the vertex without intravenous contrast.

[Series 2: head wo · axial · 0.47mm/px · z∈[-106,+39]mm · 9 of 35 slices shown, 12 images]
[im 3/35  brain]
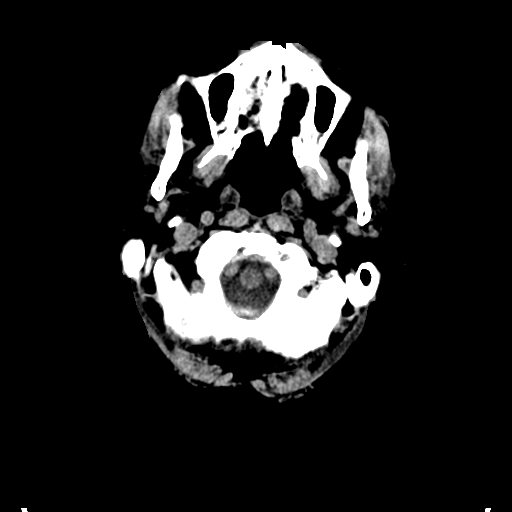
[im 3/35  bone]
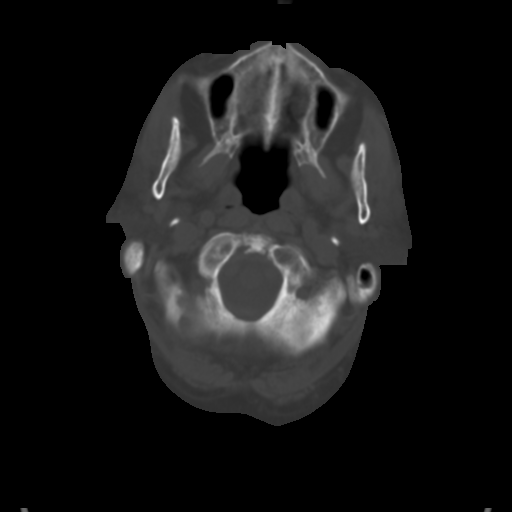
[im 6/35  brain]
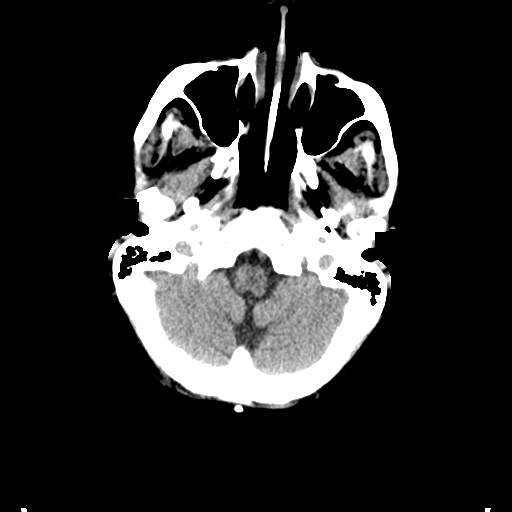
[im 10/35  brain]
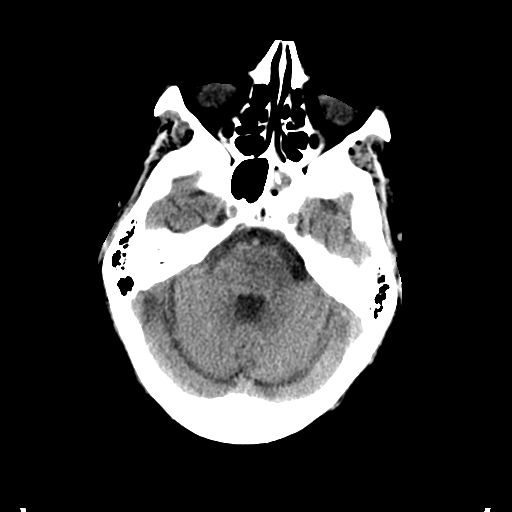
[im 13/35  brain]
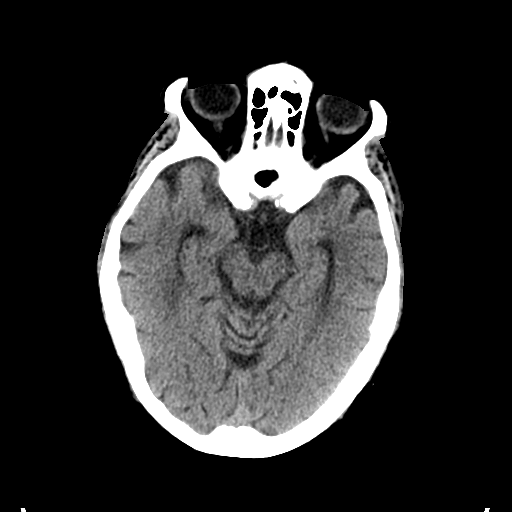
[im 18/35  brain]
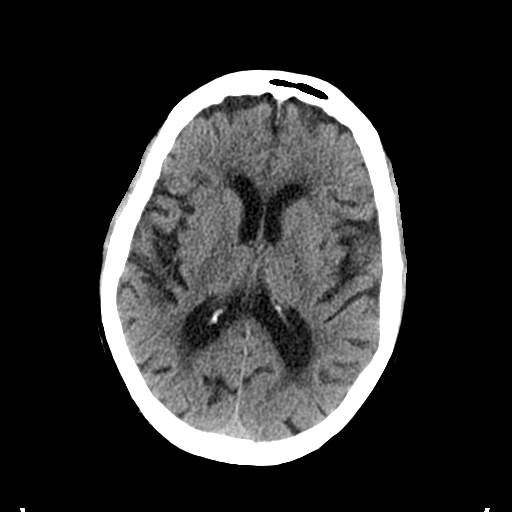
[im 18/35  bone]
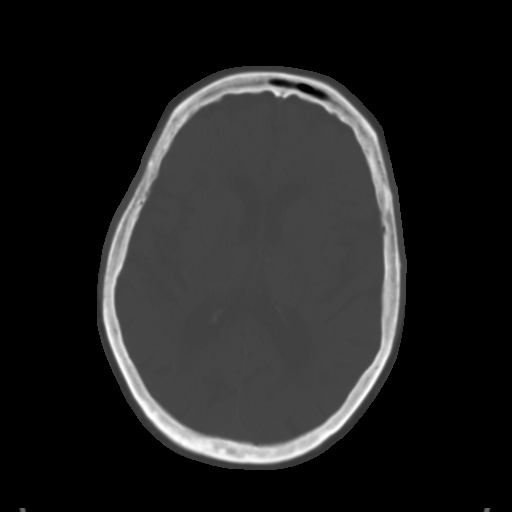
[im 22/35  brain]
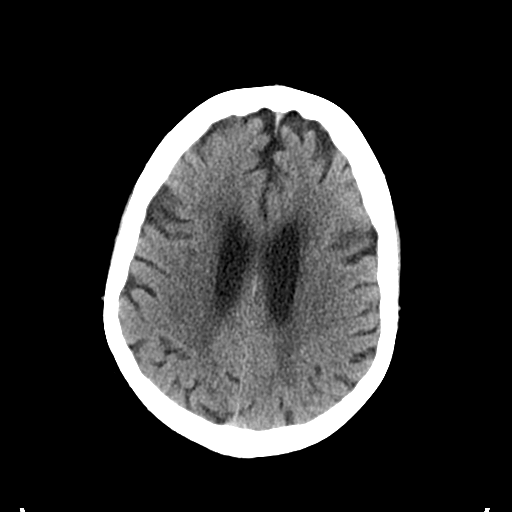
[im 25/35  brain]
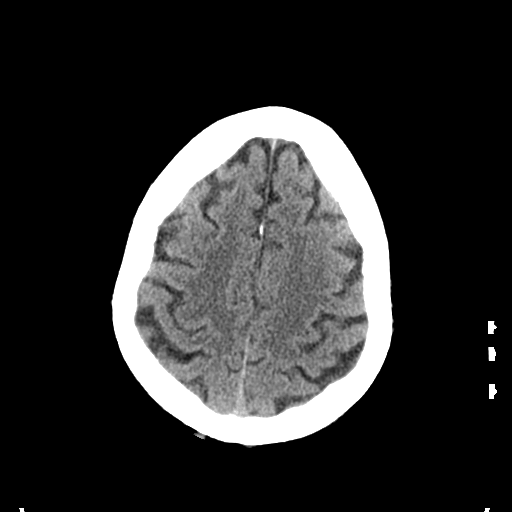
[im 29/35  brain]
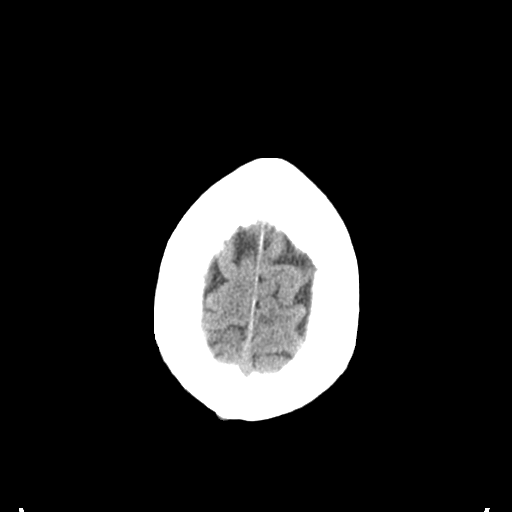
[im 32/35  brain]
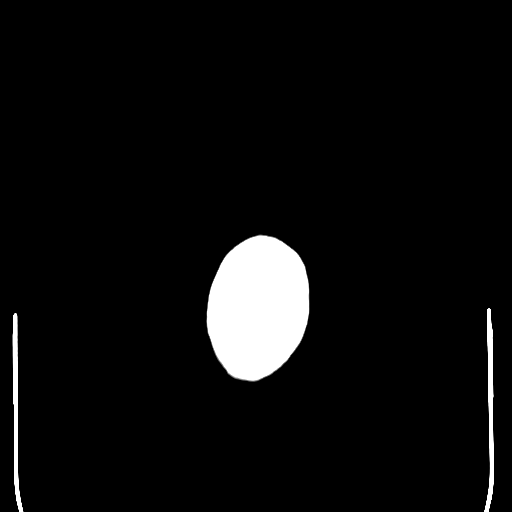
[im 32/35  bone]
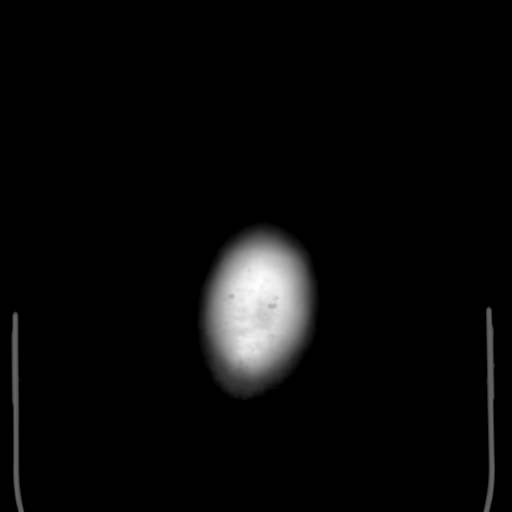

[Series 5: coronal soft tissue · coronal · 0.32mm/px · 3 of 72 slices shown]
[im 24/72  brain]
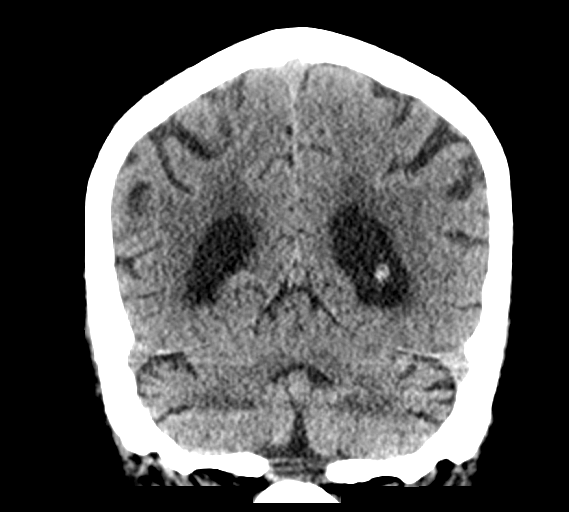
[im 32/72  brain]
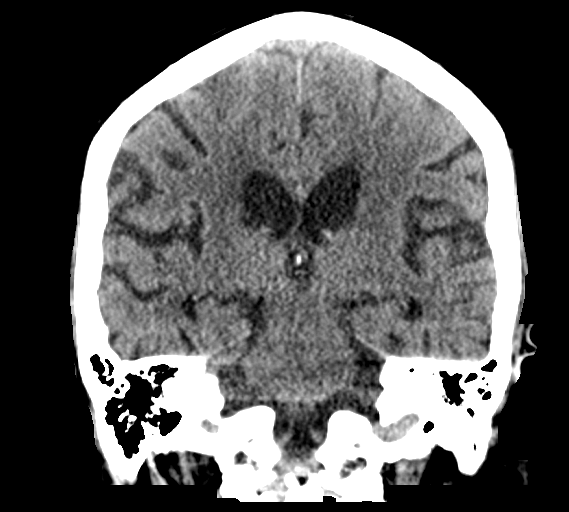
[im 40/72  brain]
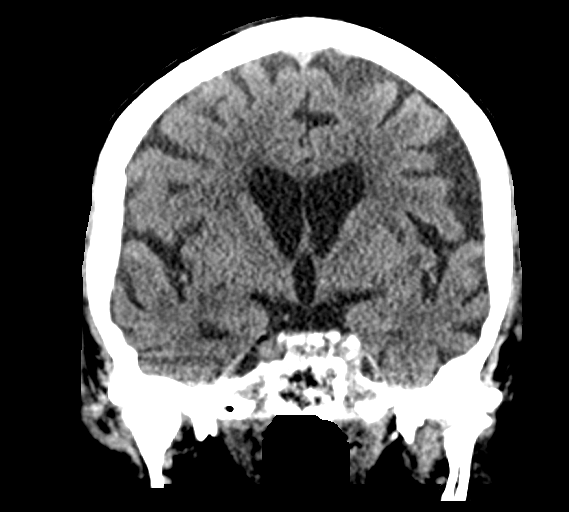

[Series 6: sagittal soft tissue · sagittal · 0.32mm/px · 3 of 59 slices shown]
[im 20/59  brain]
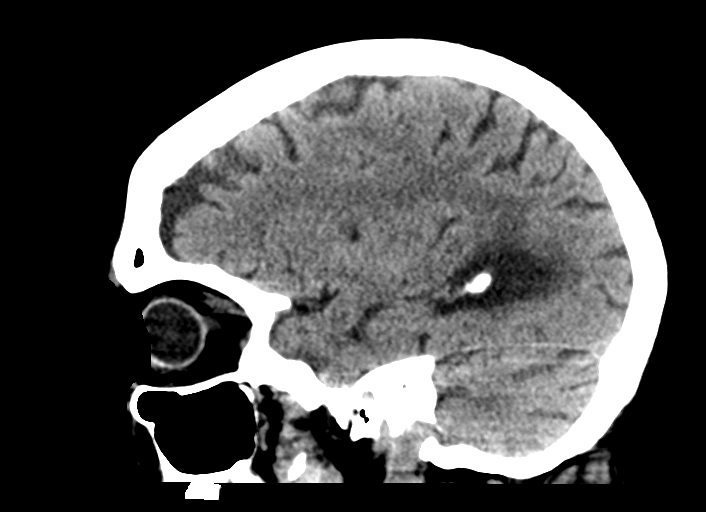
[im 30/59  brain]
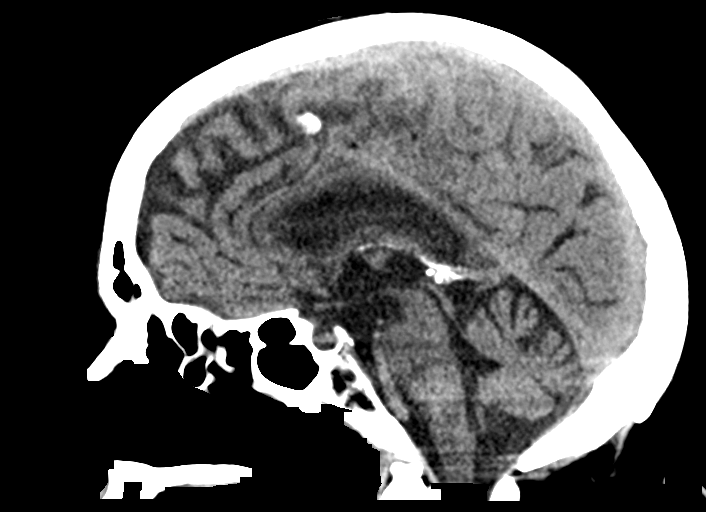
[im 39/59  brain]
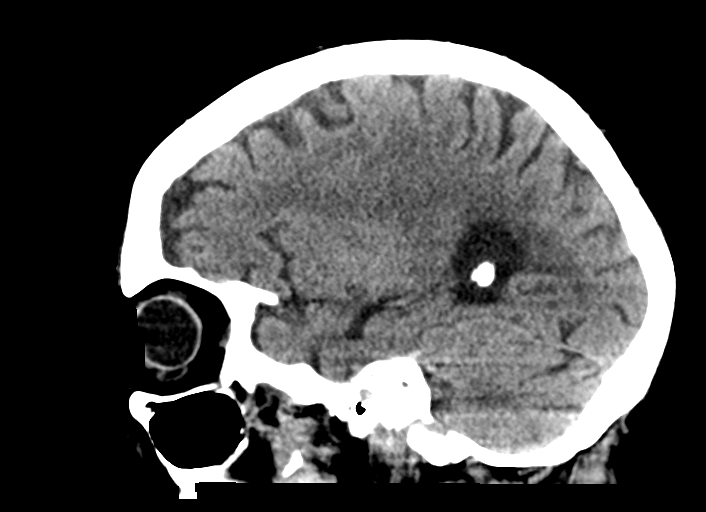

[15 of 47 positions shown; findings below may reference images not displayed]

FINDINGS: Brain: Mild chronic ischemic white matter disease. Mild diffuse
cortical atrophy. No mass effect or midline shift is noted.
Ventricular size is within normal limits. There is no evidence of
mass lesion, hemorrhage or acute infarction.

Vascular: No hyperdense vessel or unexpected calcification.

Skull: Normal. Negative for fracture or focal lesion.

Sinuses/Orbits: No acute finding.

Other: None.
IMPRESSION: Mild chronic ischemic white matter disease. Mild diffuse cortical
atrophy. No acute intracranial abnormality seen.

## 2018-12-19 IMAGING — CR DG KNEE COMPLETE 4+V*R*
5 series · 5 of 5 positions shown · non-contrast
Comparison: None.

CLINICAL DATA: Fall.  Right knee pain.

EXAM:
RIGHT KNEE - COMPLETE 4+ VIEW

[x knee ap right (1 of 3)]
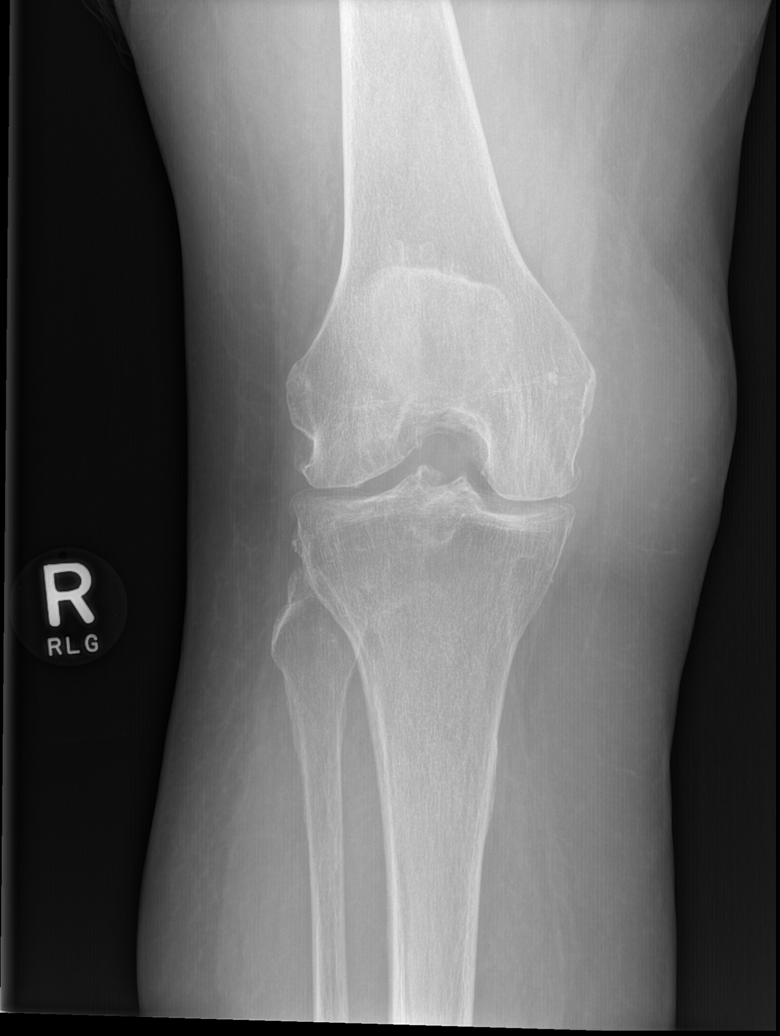

[x knee ap right (2 of 3)]
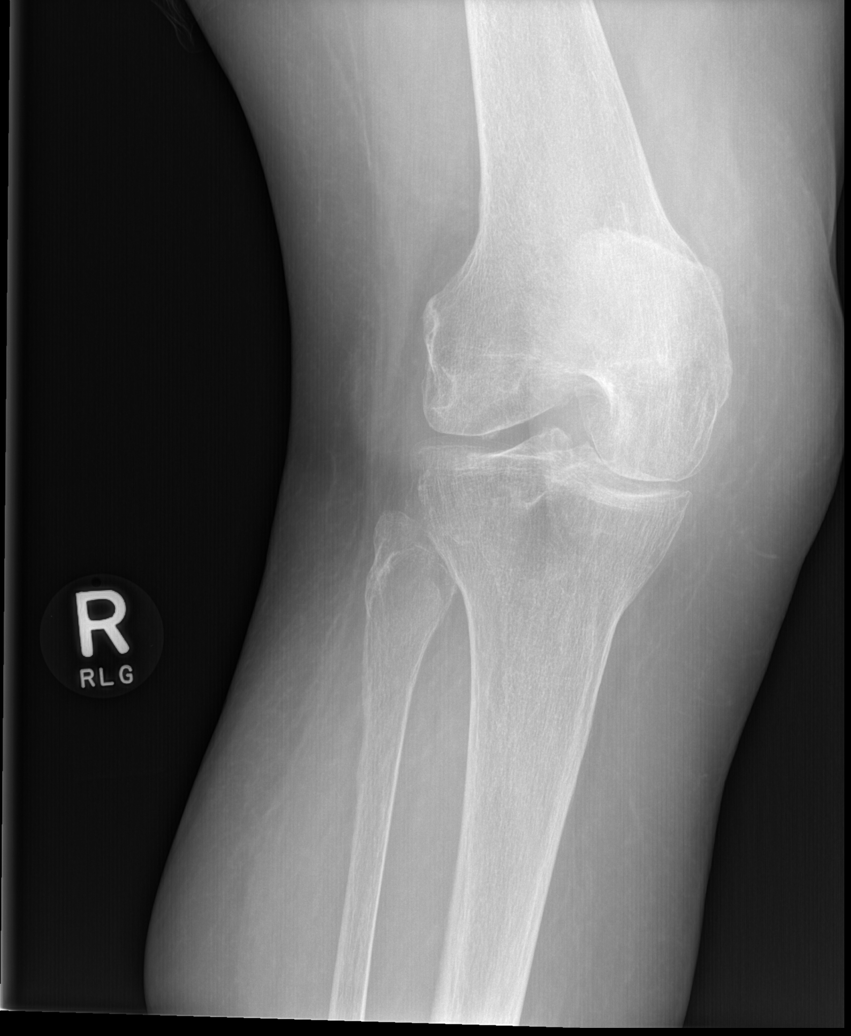

[x knee ap right (3 of 3)]
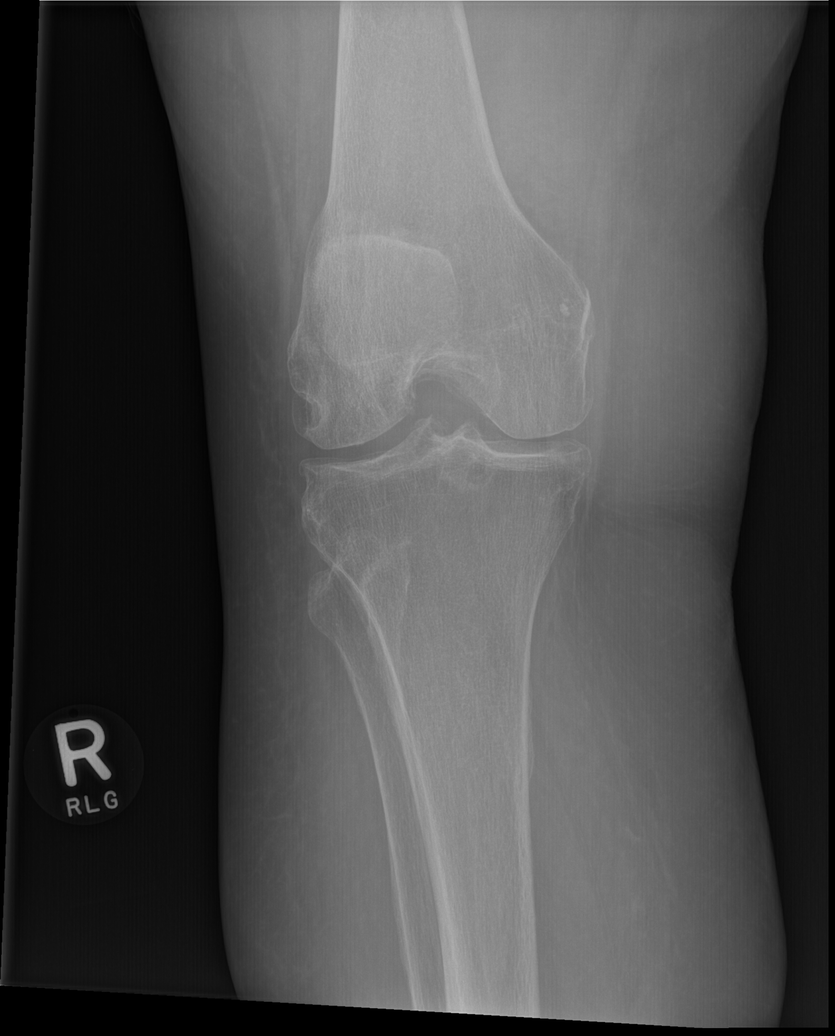

[x knee lat right (1 of 2)]
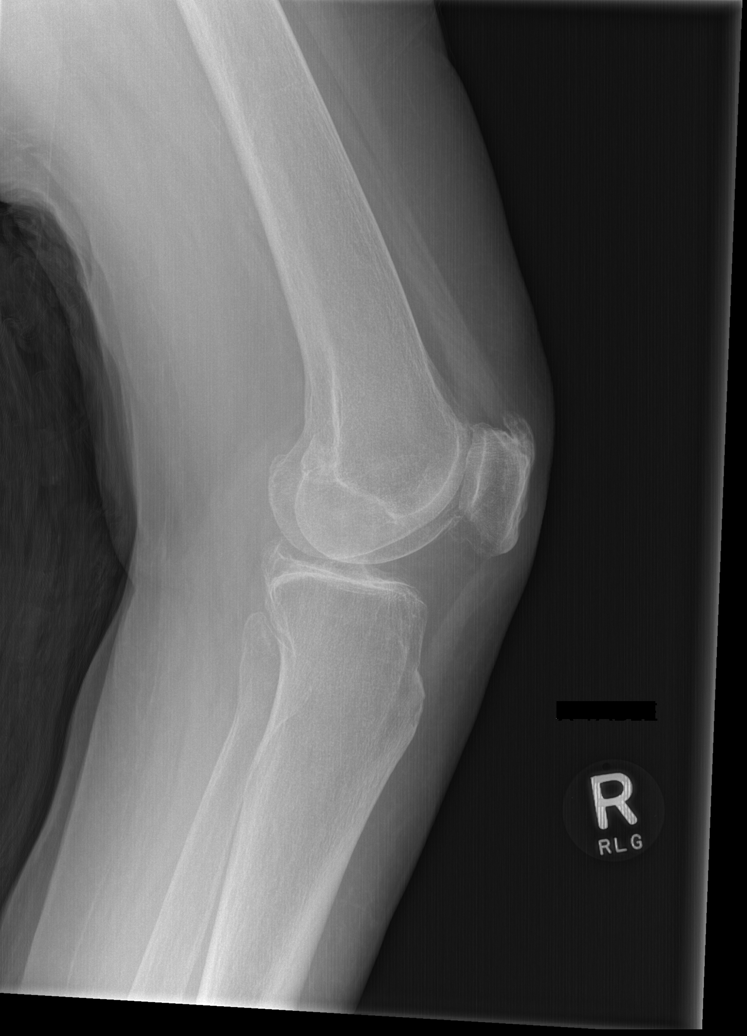

[x knee lat right (2 of 2)]
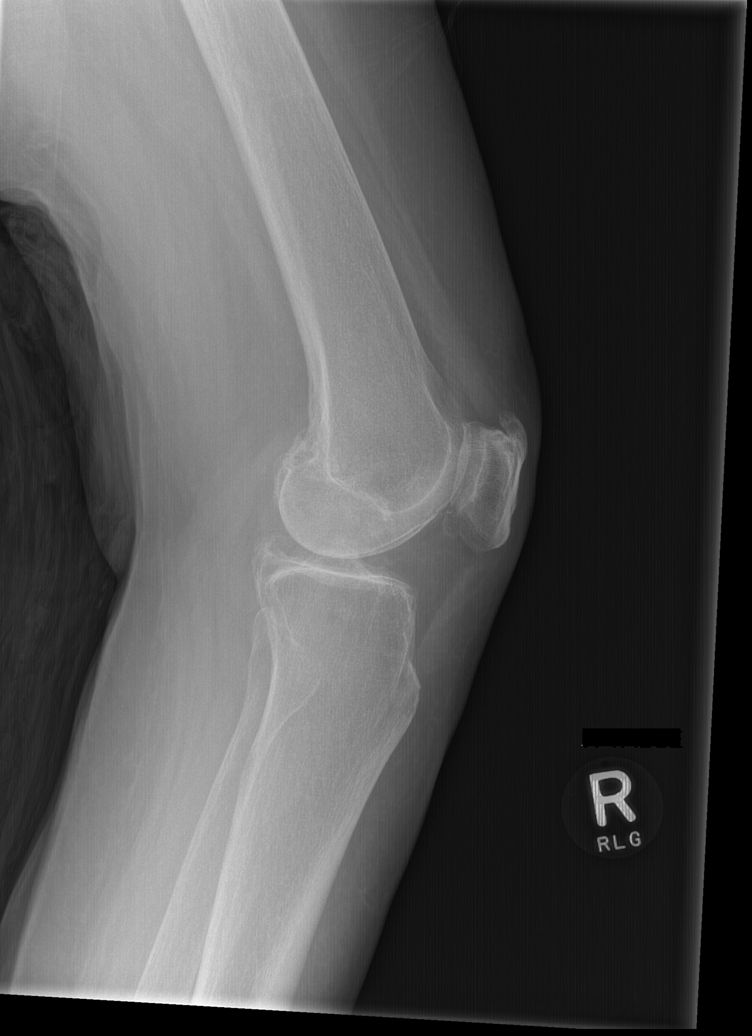

[5 of 5 positions shown; findings below may reference images not displayed]

FINDINGS: Mild-to-moderate degenerative changes in all 3 compartments of the
right knee, most pronounced in the patellofemoral compartment with
joint space narrowing and spurring. No acute bony abnormality.
Specifically, no fracture, subluxation, or dislocation.
IMPRESSION: Degenerative changes in the right knee.  No acute bony abnormality.

## 2019-02-23 ENCOUNTER — Telehealth: Payer: Self-pay | Admitting: Internal Medicine

## 2019-02-23 NOTE — Telephone Encounter (Signed)
[name] called me and left a message. He stated that the patient has an appointment on Thursday and wants to confirm if the office is open and what protocol to follow.  Please call him at [phone]. VDM (DD)

## 2019-02-26 ENCOUNTER — Encounter: Payer: Self-pay | Admitting: Internal Medicine

## 2019-02-26 ENCOUNTER — Other Ambulatory Visit: Payer: Self-pay

## 2019-02-26 ENCOUNTER — Ambulatory Visit (INDEPENDENT_AMBULATORY_CARE_PROVIDER_SITE_OTHER): Payer: Medicare Other | Admitting: Internal Medicine

## 2019-02-26 VITALS — BP 138/78 | HR 72 | Temp 98.2°F | Ht 63.0 in | Wt 177.0 lb

## 2019-02-26 DIAGNOSIS — K5790 Diverticulosis of intestine, part unspecified, without perforation or abscess without bleeding: Secondary | ICD-10-CM | POA: Diagnosis not present

## 2019-02-26 DIAGNOSIS — M1712 Unilateral primary osteoarthritis, left knee: Secondary | ICD-10-CM | POA: Diagnosis not present

## 2019-02-26 DIAGNOSIS — G3184 Mild cognitive impairment, so stated: Secondary | ICD-10-CM

## 2019-02-26 DIAGNOSIS — R739 Hyperglycemia, unspecified: Secondary | ICD-10-CM | POA: Diagnosis not present

## 2019-02-26 DIAGNOSIS — I872 Venous insufficiency (chronic) (peripheral): Secondary | ICD-10-CM | POA: Diagnosis not present

## 2019-02-26 DIAGNOSIS — F419 Anxiety disorder, unspecified: Secondary | ICD-10-CM | POA: Diagnosis not present

## 2019-02-26 DIAGNOSIS — E785 Hyperlipidemia, unspecified: Secondary | ICD-10-CM | POA: Diagnosis not present

## 2019-02-26 MED ORDER — DULOXETINE HCL 60 MG PO CPEP: 60.0000 mg | ORAL_CAPSULE | Freq: Every day | ORAL | 3 refills | Status: DC

## 2019-02-26 NOTE — Patient Instructions (Addendum)
Try salonpas with lidocaine on your right back shoulder area.    Use your fiber supplement daily.  Be sure to drink plenty of water.

## 2019-02-26 NOTE — Progress Notes (Signed)
Location:  [facility] clinic Provider:  Kimberley Speece L. Mariea Clonts, D.O., C.M.D.  Code Status: DNR Goals of Care:  Advanced Directives 10/30/2018  Does Patient Have a Medical Advance Directive? Yes  Type of Paramedic of [place];Living will;Out of facility DNR (pink MOST or yellow form)  Does patient want to make changes to medical advance directive? Yes (MAU/Ambulatory/Procedural Areas - Information given)  Copy of [place] in Chart? Yes - validated most recent copy scanned in chart (See row information)  Pre-existing out of facility DNR order (yellow form or pink MOST form) -     Chief Complaint  Patient presents with  . Medical Management of Chronic Issues    39mth follow-up    HPI: Patient is a 85 y.o. female seen today for medical management of chronic diseases.    She had her toenails trimmed last week.  She has an area that burns and vicks sauve eases it a little.    She's having a lot of joint and hip pain.  Two tylenol bid not doing the trick on some days.  She's also on the celebrex.  She's stiff in the morning.  It feels like there's pressure in her legs.  It's not a hurt.    She's having some trouble to go bm.  Lower abdomen hurts badly about 4 hrs after breakfast.  Sometimes false alarm.  A lot of times it's diarrhea.  It may happen a second time later.  She has the lower abdominal pain probably 5 days a week.  Sometimes just barely makes it to the bathroom.    She has spells of nothing looking good and not wanting to eat what her nephew and his wife bring her. She has lost 6 lbs since feb.  She says she needed to lose some.   Nothing appeals a lot at the store either.    Still has to wee wee a lot, too.  It doesn't stay back like it should.  Past Medical History:  Diagnosis Date  . Abdominal pain, epigastric   . Allergic rhinitis due to other allergen   . Allergic rhinitis, cause unspecified   . Dermatophytosis of nail   . Diffuse  cystic mastopathy   . Diverticulosis of colon (without mention of hemorrhage)   . DVT (deep venous thrombosis) ([place])   . Dysphagia, unspecified(787.20)   . Esophageal reflux   . Family history of malignant neoplasm of gastrointestinal tract   . Hiatal hernia   . Hyperlipidemia   . Hypertension   . Internal hemorrhoids without mention of complication   . 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 Obesity   . Oral aphthae   . Osteoarthrosis, unspecified whether generalized or localized, unspecified site   . Other abnormal blood chemistry   . Other voice and resonance disorders   . Pain in joint, pelvic region and thigh   . Palpitations   . Pancreatitis   . Peripheral vascular disease, unspecified ([place])   . Schatzki's ring   . Senile osteoporosis   . Torus fracture of fibula   . Unspecified constipation   . Unspecified hypothyroidism     Past Surgical History:  Procedure Laterality Date  . APPENDECTOMY    . BASAL CELL CARCINOMA EXCISION     right humerus, right temple, left side of nose  . BASAL CELL CARCINOMA EXCISION     chest, left cheek  . CATARACT EXTRACTION, BILATERAL    . CHOLECYSTECTOMY    .  keratosis     left arm  . resection of ampullary tumor    . squamous cell cancer     Right leg  . squamous cell cancer left triceps Left    Dr. Lorenza Cambridge  . TONSILLECTOMY AND ADENOIDECTOMY      Allergies  Allergen Reactions  . Penicillins     Has patient had a PCN reaction causing immediate rash, facial/tongue/throat swelling, SOB or lightheadedness with hypotension: yes Has patient had a PCN reaction causing severe rash involving mucus membranes or skin necrosis: no Has patient had a PCN reaction that required hospitalization: no Has patient had a PCN reaction occurring within the last 10 years: no If all of the above answers are "NO", then may proceed with Cephalosporin use.  . Codeine Nausea And Vomiting  . Lodine [Etodolac] Nausea And Vomiting    Outpatient  Encounter Medications as of 02/26/2019  Medication Sig  . acetaminophen (TYLENOL) 500 MG tablet Take 2 tablets (1,000 mg total) by mouth 2 (two) times daily.  Marland Kitchen alendronate (FOSAMAX) 70 MG tablet Take 1 tablet (70 mg total) by mouth once a week. Take with a full glass of water on an empty stomach.  Marland Kitchen aspirin EC 81 MG tablet Take 81 mg by mouth daily.  . bimatoprost (LUMIGAN) 0.01 % SOLN Place 1 drop into both eyes at bedtime.  . Calcium Carb-Cholecalciferol (CALCIUM 600+D3 PO) Take by mouth. 2 by mouth daily  . celecoxib (CELEBREX) 100 MG capsule Take 1 capsule (100 mg total) by mouth daily.  . Cholecalciferol (VITAMIN D3) 50 MCG (2000 UT) capsule Take 2,000 Units by mouth daily.  . DULoxetine (CYMBALTA) 30 MG capsule Take 1 capsule (30 mg total) by mouth daily.  . fluticasone (FLONASE) 50 MCG/ACT nasal spray Place 1 spray into both nostrils daily as needed.   . gabapentin (NEURONTIN) 300 MG capsule Take 1 capsule (300 mg total) by mouth at bedtime.  . Inulin (FIBER CHOICE PO) Take 5 g by mouth as needed.   Marland Kitchen levothyroxine (SYNTHROID, LEVOTHROID) 50 MCG tablet TAKE 1 TABLET DAILY FOR THYROID SUPPLEMENT  . Menthol, Topical Analgesic, (BIOFREEZE) 4 % GEL Apply 1 application topically daily as needed (Knee pain).  . Multiple Vitamin (MULTIVITAMIN WITH MINERALS) TABS tablet Take 1 tablet by mouth daily.  . Omega-3 Fatty Acids (FISH OIL) 1200 MG CAPS Take 1,200 mg by mouth 3 (three) times daily.   Marland Kitchen omeprazole (PRILOSEC) 20 MG capsule TAKE 1 CAPSULE DAILY TO REDUCE STOMACH ACID.  Marland Kitchen Polyethyl Glycol-Propyl Glycol (SYSTANE ULTRA OP) Place 1 drop into both eyes 3 (three) times daily as needed (dry eyes).    No facility-administered encounter medications on file as of 02/26/2019.     Review of Systems:  Review of Systems  Constitutional: Negative for chills, fever and malaise/fatigue.  HENT: Positive for hearing loss. Negative for congestion.   Eyes: Positive for blurred vision.  Respiratory:  Negative for cough and shortness of breath.   Cardiovascular: Positive for leg swelling. Negative for chest pain and palpitations.  Gastrointestinal: Positive for abdominal pain, constipation and diarrhea. Negative for blood in stool and melena.  Genitourinary: Positive for frequency and urgency. Negative for dysuria, flank pain and hematuria.  Musculoskeletal: Positive for joint pain and myalgias. Negative for falls.  Skin: Negative for itching and rash.  Neurological: Negative for dizziness and loss of consciousness.  Endo/Heme/Allergies: Bruises/bleeds easily.  Psychiatric/Behavioral: Positive for depression and memory loss. The patient is nervous/anxious. The patient does not have insomnia.  Health Maintenance  Topic Date Due  . INFLUENZA VACCINE  04/11/2019  . TETANUS/TDAP  07/11/2028  . DEXA SCAN  Completed  . PNA vac Low Risk Adult  Completed    Physical Exam: Vitals:   02/26/19 1056  BP: 138/78  Pulse: 72  Temp: 98.2 F (36.8 C)  TempSrc: Oral  SpO2: 96%  Weight: 177 lb (80.3 kg)  Height: 5\' 3"  (1.6 m)   Body mass index is 31.35 kg/m. Physical Exam Vitals signs reviewed.  Constitutional:      General: She is not in acute distress.    Appearance: Normal appearance. She is not ill-appearing or toxic-appearing.  HENT:     Head: Normocephalic and atraumatic.     Ears:     Comments: HOH Eyes:     Comments: glasses  Cardiovascular:     Rate and Rhythm: Normal rate and regular rhythm.     Pulses: Normal pulses.     Heart sounds: Normal heart sounds.  Pulmonary:     Effort: Pulmonary effort is normal.     Breath sounds: Normal breath sounds.  Abdominal:     General: Bowel sounds are normal. There is no distension.     Palpations: Abdomen is soft. There is no mass.     Tenderness: There is no abdominal tenderness. There is no guarding or rebound.  Musculoskeletal:     Right lower leg: Edema present.     Left lower leg: Edema present.     Comments: Decreased  ROM right shoulder (chronic); prominent varicose veins in feet; diminished pulses 1+  Skin:    General: Skin is warm and dry.     Comments: Burning pain over right shoulder  Neurological:     General: No focal deficit present.     Mental Status: She is alert. Mental status is at baseline.     Gait: Gait abnormal.     Comments: Walks with walker  Psychiatric:        Mood and Affect: Mood normal.     Labs reviewed: Basic Metabolic Panel: Recent Labs    10/30/18 1354  NA 141  K 3.9  CL 104  CO2 27  GLUCOSE 123*  BUN 17  CREATININE 0.75  CALCIUM 9.6  TSH 2.06   Liver Function Tests: Recent Labs    10/30/18 1354  AST 19  ALT 14  BILITOT 0.5  PROT 6.8   No results for input(s): LIPASE, AMYLASE in the last 8760 hours. No results for input(s): AMMONIA in the last 8760 hours. CBC: No results for input(s): WBC, NEUTROABS, HGB, HCT, MCV, PLT in the last 8760 hours. Lipid Panel: No results for input(s): CHOL, HDL, LDLCALC, TRIG, CHOLHDL, LDLDIRECT in the last 8760 hours. Lab Results  Component Value Date   HGBA1C 5.3 07/01/2017    Procedures since last visit: No results found.  Assessment/Plan 1. Primary osteoarthritis of left knee -ongoing, cont tylenol, celebrex, topicals, try to walk within trailer  2. Hyperlipidemia, unspecified hyperlipidemia type -cont fish oil more for joints, not on meds for this at 85 yo  3. Hyperglycemia -encouraged healthier diet but highly limited by relying on others to provide her meals   4. MCI (mild cognitive impairment) with memory loss -gradually progressive, cont support from nephew and his wife who are coming to appts  5. Chronic venous insufficiency -improved with elevation of feet; really should have compression hose but does not have anyone who lives with her that could help to put them on  in the am and take off at hs  6. Diverticulosis -suspect cause of abdominal pain; i'm concerned she's getting nearly impacted and  then having large loose bms when it breaks loose -advised daily fiber supplement and drink a glass of water immediately after taking it on a consistent basis to see if we can bulk the stool and keep it moving  7. Anxiety -and some periods of depression have been noted by family and they request a medication adjustment--I'm hoping this will also help her joint pain - DULoxetine (CYMBALTA) 60 MG capsule; Take 1 capsule (60 mg total) by mouth daily.  Dispense: 90 capsule; Refill: 3  Labs/tests ordered:  No new Next appt:  07/13/2019  Inesha Sow L. Ramla Hase, D.O. [facility] [address], [place] [address] Cell Phone (Mon-Fri 8am-5pm):  [phone] On Call:  [phone] & follow prompts after 5pm & weekends Office Phone:  [phone] Office Fax:  [phone]

## 2019-03-02 ENCOUNTER — Other Ambulatory Visit: Payer: Self-pay | Admitting: *Deleted

## 2019-03-02 MED ORDER — GABAPENTIN 300 MG PO CAPS: 300.0000 mg | ORAL_CAPSULE | Freq: Every day | ORAL | 1 refills | Status: DC

## 2019-03-02 NOTE — Telephone Encounter (Signed)
Patient requested refill

## 2019-03-24 ENCOUNTER — Other Ambulatory Visit: Payer: Self-pay | Admitting: Internal Medicine

## 2019-04-15 DIAGNOSIS — H401134 Primary open-angle glaucoma, bilateral, indeterminate stage: Secondary | ICD-10-CM | POA: Diagnosis not present

## 2019-04-15 DIAGNOSIS — H0100A Unspecified blepharitis right eye, upper and lower eyelids: Secondary | ICD-10-CM | POA: Diagnosis not present

## 2019-04-15 DIAGNOSIS — H0100B Unspecified blepharitis left eye, upper and lower eyelids: Secondary | ICD-10-CM | POA: Diagnosis not present

## 2019-06-04 ENCOUNTER — Other Ambulatory Visit: Payer: Self-pay

## 2019-06-04 ENCOUNTER — Encounter: Payer: Self-pay | Admitting: Internal Medicine

## 2019-06-04 ENCOUNTER — Ambulatory Visit (INDEPENDENT_AMBULATORY_CARE_PROVIDER_SITE_OTHER): Payer: Medicare Other | Admitting: Internal Medicine

## 2019-06-04 VITALS — BP 120/64 | HR 71 | Temp 97.9°F | Ht 63.0 in | Wt 166.2 lb

## 2019-06-04 DIAGNOSIS — F22 Delusional disorders: Secondary | ICD-10-CM | POA: Diagnosis not present

## 2019-06-04 DIAGNOSIS — H18529 Epithelial (juvenile) corneal dystrophy, unspecified eye: Secondary | ICD-10-CM

## 2019-06-04 DIAGNOSIS — F0281 Dementia in other diseases classified elsewhere with behavioral disturbance: Secondary | ICD-10-CM | POA: Diagnosis not present

## 2019-06-04 DIAGNOSIS — Z23 Encounter for immunization: Secondary | ICD-10-CM | POA: Diagnosis not present

## 2019-06-04 DIAGNOSIS — H1859 Other hereditary corneal dystrophies: Secondary | ICD-10-CM | POA: Diagnosis not present

## 2019-06-04 DIAGNOSIS — F02818 Dementia in other diseases classified elsewhere, unspecified severity, with other behavioral disturbance: Secondary | ICD-10-CM

## 2019-06-04 DIAGNOSIS — G301 Alzheimer's disease with late onset: Secondary | ICD-10-CM

## 2019-06-04 NOTE — Progress Notes (Signed)
Location:  [facility] clinic Provider:  Blakelynn Scheeler L. Mariea Clonts, D.O., C.M.D.  Code Status: DNR Goals of Care:  Advanced Directives 06/04/2019  Does Patient Have a Medical Advance Directive? Yes  Type of Advance Directive [place]  Does patient want to make changes to medical advance directive? No - Patient declined  Copy of [place] in Chart? Yes - validated most recent copy scanned in chart (See row information)  Pre-existing out of facility DNR order (yellow form or pink MOST form) -   Chief Complaint  Patient presents with  . Acute Visit    Family states patient having symptoms of sundowning, accompanied by nephew's girlfriend  . Immunizations    Flu shot given today    HPI: Patient is a 85 y.o. female seen today for acute visit due to seeing people in her place, taking her pills wrong in her pillbox.    Has two what appear to be insect bites on her abdomen.  Says people talk, go under her house.  There was a dog at first.  They came the one evening after dark and went under the trailer.  She could hear them by the ramp.  She called her neighbor, then called her nephew, [name].  She says her nephew put locks on the doors under the trailer.  There have also been people on the patio during the day that are not there.  She's been afraid of these people.  She saw some sitting on the back of a pickup truck.  She thinks they are going to stay in the doghouse instead.  She heard water running this am.  Sunday night, she was scared about this.   Her nephew's friend has been calling her and telling her how to take the pills in the pillbox, orienting her to day.    I've referred to home health a couple of times last year, but appears she did not get it and the referrals were just closed out.    She cannot see well either.    Already given flu shot today.  Past Medical History:  Diagnosis Date  . Abdominal pain, epigastric   . Allergic rhinitis due to other  allergen   . Allergic rhinitis, cause unspecified   . Dermatophytosis of nail   . Diffuse cystic mastopathy   . Diverticulosis of colon (without mention of hemorrhage)   . DVT (deep venous thrombosis) ([place])   . Dysphagia, unspecified(787.20)   . Esophageal reflux   . Family history of malignant neoplasm of gastrointestinal tract   . Hiatal hernia   . Hyperlipidemia   . Hypertension   . Internal hemorrhoids without mention of complication   . 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 Obesity   . Oral aphthae   . Osteoarthrosis, unspecified whether generalized or localized, unspecified site   . Other abnormal blood chemistry   . Other voice and resonance disorders   . Pain in joint, pelvic region and thigh   . Palpitations   . Pancreatitis   . Peripheral vascular disease, unspecified ([place])   . Schatzki's ring   . Senile osteoporosis   . Torus fracture of fibula   . Unspecified constipation   . Unspecified hypothyroidism     Past Surgical History:  Procedure Laterality Date  . APPENDECTOMY    . BASAL CELL CARCINOMA EXCISION     right humerus, right temple, left side of nose  . BASAL CELL CARCINOMA EXCISION  chest, left cheek  . CATARACT EXTRACTION, BILATERAL    . CHOLECYSTECTOMY    . keratosis     left arm  . resection of ampullary tumor    . squamous cell cancer     Right leg  . squamous cell cancer left triceps Left    Dr. Lorenza Cambridge  . TONSILLECTOMY AND ADENOIDECTOMY      Allergies  Allergen Reactions  . Penicillins     Has patient had a PCN reaction causing immediate rash, facial/tongue/throat swelling, SOB or lightheadedness with hypotension: yes Has patient had a PCN reaction causing severe rash involving mucus membranes or skin necrosis: no Has patient had a PCN reaction that required hospitalization: no Has patient had a PCN reaction occurring within the last 10 years: no If all of the above answers are "NO", then may proceed with Cephalosporin  use.  . Codeine Nausea And Vomiting  . Lodine [Etodolac] Nausea And Vomiting    Outpatient Encounter Medications as of 06/04/2019  Medication Sig  . acetaminophen (TYLENOL) 500 MG tablet Take 2 tablets (1,000 mg total) by mouth 2 (two) times daily.  Marland Kitchen alendronate (FOSAMAX) 70 MG tablet Take 1 tablet (70 mg total) by mouth once a week. Take with a full glass of water on an empty stomach.  Marland Kitchen aspirin EC 81 MG tablet Take 81 mg by mouth daily.  . bimatoprost (LUMIGAN) 0.01 % SOLN Place 1 drop into both eyes at bedtime.  . Calcium Carb-Cholecalciferol (CALCIUM 600+D3 PO) Take by mouth. 2 by mouth daily  . celecoxib (CELEBREX) 100 MG capsule Take 1 capsule (100 mg total) by mouth daily.  . Cholecalciferol (VITAMIN D3) 50 MCG (2000 UT) capsule Take 2,000 Units by mouth daily.  . DULoxetine (CYMBALTA) 60 MG capsule Take 1 capsule (60 mg total) by mouth daily.  . fluticasone (FLONASE) 50 MCG/ACT nasal spray Place 1 spray into both nostrils daily as needed.   . gabapentin (NEURONTIN) 300 MG capsule TAKE 1 CAPSULE BY MOUTH EVERYDAY AT BEDTIME  . Inulin (FIBER CHOICE PO) Take 5 g by mouth as needed.   Marland Kitchen levothyroxine (SYNTHROID, LEVOTHROID) 50 MCG tablet TAKE 1 TABLET DAILY FOR THYROID SUPPLEMENT  . Multiple Vitamin (MULTIVITAMIN WITH MINERALS) TABS tablet Take 1 tablet by mouth daily.  . Omega-3 Fatty Acids (FISH OIL) 1200 MG CAPS Take 1,200 mg by mouth 3 (three) times daily.   Marland Kitchen omeprazole (PRILOSEC) 20 MG capsule TAKE 1 CAPSULE DAILY TO REDUCE STOMACH ACID.  Marland Kitchen Polyethyl Glycol-Propyl Glycol (SYSTANE ULTRA OP) Place 1 drop into both eyes 3 (three) times daily as needed (dry eyes).   . [DISCONTINUED] Menthol, Topical Analgesic, (BIOFREEZE) 4 % GEL Apply 1 application topically daily as needed (Knee pain).   No facility-administered encounter medications on file as of 06/04/2019.     Review of Systems:  Review of Systems  Constitutional: Negative for chills, fever and malaise/fatigue.  HENT:  Positive for hearing loss.   Eyes: Positive for blurred vision.  Respiratory: Negative for cough and shortness of breath.   Cardiovascular: Positive for leg swelling. Negative for chest pain and palpitations.  Gastrointestinal: Negative for abdominal pain, constipation and diarrhea.  Genitourinary: Negative for dysuria.  Musculoskeletal: Positive for back pain and joint pain. Negative for falls.  Skin: Positive for itching.       Two insect bites on abdomen  Neurological: Negative for dizziness and loss of consciousness.  Psychiatric/Behavioral: Positive for hallucinations and memory loss. Negative for depression. The patient is not nervous/anxious.  Health Maintenance  Topic Date Due  . INFLUENZA VACCINE  04/11/2019  . TETANUS/TDAP  07/11/2028  . DEXA SCAN  Completed  . PNA vac Low Risk Adult  Completed    Physical Exam: Vitals:   06/04/19 1140  BP: 120/64  Pulse: 71  Temp: 97.9 F (36.6 C)  TempSrc: Oral  SpO2: 95%  Weight: 166 lb 3.2 oz (75.4 kg)  Height: 5\' 3"  (1.6 m)   Body mass index is 29.44 kg/m. Physical Exam Vitals signs reviewed.  Constitutional:      General: She is not in acute distress.    Appearance: Normal appearance. She is not toxic-appearing.  HENT:     Head: Normocephalic and atraumatic.  Cardiovascular:     Rate and Rhythm: Normal rate and regular rhythm.     Pulses: Normal pulses.     Heart sounds: Normal heart sounds.  Pulmonary:     Effort: Pulmonary effort is normal.     Breath sounds: Normal breath sounds.  Abdominal:     General: Bowel sounds are normal.  Musculoskeletal: Normal range of motion.     Right lower leg: No edema.     Left lower leg: No edema.  Skin:    General: Skin is warm and dry.     Comments: Two small red papules that are pruritic  Neurological:     General: No focal deficit present.     Mental Status: She is alert.     Cranial Nerves: No cranial nerve deficit.     Gait: Gait abnormal.     Comments: Repeats  herself; uses walker  Psychiatric:        Mood and Affect: Mood normal.     Labs reviewed: Basic Metabolic Panel: Recent Labs    10/30/18 1354  NA 141  K 3.9  CL 104  CO2 27  GLUCOSE 123*  BUN 17  CREATININE 0.75  CALCIUM 9.6  TSH 2.06   Liver Function Tests: Recent Labs    10/30/18 1354  AST 19  ALT 14  BILITOT 0.5  PROT 6.8   No results for input(s): LIPASE, AMYLASE in the last 8760 hours. No results for input(s): AMMONIA in the last 8760 hours. CBC: No results for input(s): WBC, NEUTROABS, HGB, HCT, MCV, PLT in the last 8760 hours. Lipid Panel: No results for input(s): CHOL, HDL, LDLCALC, TRIG, CHOLHDL, LDLDIRECT in the last 8760 hours. Lab Results  Component Value Date   HGBA1C 5.3 07/01/2017    Procedures since last visit: No results found.  Assessment/Plan 1. Need for influenza vaccination - Flu Vaccine QUAD High Dose(Fluad)  2. Late onset Alzheimer's disease with behavioral disturbance ([place]) - Ambulatory referral to [place] work, nursing and therapy--she needs more assistance due to progressing dementia and delusions now -will try to get pill pack set up to help with that -discussed with family that CNAs and sitters are not covered by insurance and require out of pocket cost -we did discuss that she may be reaching a point where she needs to move to a SNF -she is still managing her own finances with [name]'s assistance  3. Delusions ([place]) -recent addition to her cognitive issues -avoid antipsychotics for her due to living alone and fall risk  4. Map-dot-fingerprint corneal dystrophy -visual decline limits activity some as well  Labs/tests ordered:  No new today Next appt:  06/29/2019  Jaivon Vanbeek L. Stephon Weathers, D.O. [facility] [address], [place] [address]  Cell Phone (Mon-Fri 8am-5pm):  [phone] On Call:  [phone] & follow prompts after 5pm & weekends Office Phone:  [phone]  Office Fax:  [phone]

## 2019-06-04 NOTE — Patient Instructions (Addendum)
I'm hesitant to prescribe an antipsychotic to help with the delusional thoughts/hallucinations because of her fall risk and the increased risk of stroke.    We will put in a home health referral.

## 2019-06-08 DIAGNOSIS — R2689 Other abnormalities of gait and mobility: Secondary | ICD-10-CM | POA: Diagnosis not present

## 2019-06-08 DIAGNOSIS — Z743 Need for continuous supervision: Secondary | ICD-10-CM | POA: Diagnosis not present

## 2019-06-08 DIAGNOSIS — G301 Alzheimer's disease with late onset: Secondary | ICD-10-CM | POA: Diagnosis not present

## 2019-06-08 DIAGNOSIS — R32 Unspecified urinary incontinence: Secondary | ICD-10-CM | POA: Diagnosis not present

## 2019-06-08 DIAGNOSIS — F0281 Dementia in other diseases classified elsewhere with behavioral disturbance: Secondary | ICD-10-CM | POA: Diagnosis not present

## 2019-06-10 DIAGNOSIS — G301 Alzheimer's disease with late onset: Secondary | ICD-10-CM | POA: Diagnosis not present

## 2019-06-10 DIAGNOSIS — R32 Unspecified urinary incontinence: Secondary | ICD-10-CM | POA: Diagnosis not present

## 2019-06-10 DIAGNOSIS — Z743 Need for continuous supervision: Secondary | ICD-10-CM | POA: Diagnosis not present

## 2019-06-10 DIAGNOSIS — R2689 Other abnormalities of gait and mobility: Secondary | ICD-10-CM | POA: Diagnosis not present

## 2019-06-10 DIAGNOSIS — F0281 Dementia in other diseases classified elsewhere with behavioral disturbance: Secondary | ICD-10-CM | POA: Diagnosis not present

## 2019-06-12 DIAGNOSIS — R32 Unspecified urinary incontinence: Secondary | ICD-10-CM | POA: Diagnosis not present

## 2019-06-12 DIAGNOSIS — R2689 Other abnormalities of gait and mobility: Secondary | ICD-10-CM | POA: Diagnosis not present

## 2019-06-12 DIAGNOSIS — F0281 Dementia in other diseases classified elsewhere with behavioral disturbance: Secondary | ICD-10-CM | POA: Diagnosis not present

## 2019-06-12 DIAGNOSIS — G301 Alzheimer's disease with late onset: Secondary | ICD-10-CM | POA: Diagnosis not present

## 2019-06-12 DIAGNOSIS — Z743 Need for continuous supervision: Secondary | ICD-10-CM | POA: Diagnosis not present

## 2019-06-15 DIAGNOSIS — Z743 Need for continuous supervision: Secondary | ICD-10-CM | POA: Diagnosis not present

## 2019-06-15 DIAGNOSIS — F0281 Dementia in other diseases classified elsewhere with behavioral disturbance: Secondary | ICD-10-CM | POA: Diagnosis not present

## 2019-06-15 DIAGNOSIS — G301 Alzheimer's disease with late onset: Secondary | ICD-10-CM | POA: Diagnosis not present

## 2019-06-15 DIAGNOSIS — R2689 Other abnormalities of gait and mobility: Secondary | ICD-10-CM | POA: Diagnosis not present

## 2019-06-15 DIAGNOSIS — R32 Unspecified urinary incontinence: Secondary | ICD-10-CM | POA: Diagnosis not present

## 2019-06-16 DIAGNOSIS — G301 Alzheimer's disease with late onset: Secondary | ICD-10-CM | POA: Diagnosis not present

## 2019-06-16 DIAGNOSIS — Z743 Need for continuous supervision: Secondary | ICD-10-CM | POA: Diagnosis not present

## 2019-06-16 DIAGNOSIS — R2689 Other abnormalities of gait and mobility: Secondary | ICD-10-CM | POA: Diagnosis not present

## 2019-06-16 DIAGNOSIS — F0281 Dementia in other diseases classified elsewhere with behavioral disturbance: Secondary | ICD-10-CM | POA: Diagnosis not present

## 2019-06-16 DIAGNOSIS — R32 Unspecified urinary incontinence: Secondary | ICD-10-CM | POA: Diagnosis not present

## 2019-06-19 DIAGNOSIS — F0281 Dementia in other diseases classified elsewhere with behavioral disturbance: Secondary | ICD-10-CM | POA: Diagnosis not present

## 2019-06-19 DIAGNOSIS — R2689 Other abnormalities of gait and mobility: Secondary | ICD-10-CM | POA: Diagnosis not present

## 2019-06-19 DIAGNOSIS — R32 Unspecified urinary incontinence: Secondary | ICD-10-CM | POA: Diagnosis not present

## 2019-06-19 DIAGNOSIS — G301 Alzheimer's disease with late onset: Secondary | ICD-10-CM | POA: Diagnosis not present

## 2019-06-19 DIAGNOSIS — Z743 Need for continuous supervision: Secondary | ICD-10-CM | POA: Diagnosis not present

## 2019-06-22 DIAGNOSIS — G301 Alzheimer's disease with late onset: Secondary | ICD-10-CM | POA: Diagnosis not present

## 2019-06-22 DIAGNOSIS — Z743 Need for continuous supervision: Secondary | ICD-10-CM | POA: Diagnosis not present

## 2019-06-22 DIAGNOSIS — R2689 Other abnormalities of gait and mobility: Secondary | ICD-10-CM | POA: Diagnosis not present

## 2019-06-22 DIAGNOSIS — F0281 Dementia in other diseases classified elsewhere with behavioral disturbance: Secondary | ICD-10-CM | POA: Diagnosis not present

## 2019-06-22 DIAGNOSIS — R32 Unspecified urinary incontinence: Secondary | ICD-10-CM | POA: Diagnosis not present

## 2019-06-23 DIAGNOSIS — F0281 Dementia in other diseases classified elsewhere with behavioral disturbance: Secondary | ICD-10-CM | POA: Diagnosis not present

## 2019-06-23 DIAGNOSIS — R2689 Other abnormalities of gait and mobility: Secondary | ICD-10-CM | POA: Diagnosis not present

## 2019-06-23 DIAGNOSIS — Z743 Need for continuous supervision: Secondary | ICD-10-CM | POA: Diagnosis not present

## 2019-06-23 DIAGNOSIS — G301 Alzheimer's disease with late onset: Secondary | ICD-10-CM | POA: Diagnosis not present

## 2019-06-23 DIAGNOSIS — R32 Unspecified urinary incontinence: Secondary | ICD-10-CM | POA: Diagnosis not present

## 2019-06-26 DIAGNOSIS — Z743 Need for continuous supervision: Secondary | ICD-10-CM | POA: Diagnosis not present

## 2019-06-26 DIAGNOSIS — R2689 Other abnormalities of gait and mobility: Secondary | ICD-10-CM | POA: Diagnosis not present

## 2019-06-26 DIAGNOSIS — G301 Alzheimer's disease with late onset: Secondary | ICD-10-CM | POA: Diagnosis not present

## 2019-06-26 DIAGNOSIS — R32 Unspecified urinary incontinence: Secondary | ICD-10-CM | POA: Diagnosis not present

## 2019-06-26 DIAGNOSIS — F0281 Dementia in other diseases classified elsewhere with behavioral disturbance: Secondary | ICD-10-CM | POA: Diagnosis not present

## 2019-06-29 ENCOUNTER — Ambulatory Visit (INDEPENDENT_AMBULATORY_CARE_PROVIDER_SITE_OTHER): Payer: Medicare Other | Admitting: Internal Medicine

## 2019-06-29 ENCOUNTER — Other Ambulatory Visit: Payer: Self-pay

## 2019-06-29 ENCOUNTER — Encounter: Payer: Self-pay | Admitting: Internal Medicine

## 2019-06-29 VITALS — BP 118/72 | HR 67 | Temp 97.5°F | Resp 20 | Ht 63.0 in | Wt 167.2 lb

## 2019-06-29 DIAGNOSIS — F22 Delusional disorders: Secondary | ICD-10-CM | POA: Diagnosis not present

## 2019-06-29 DIAGNOSIS — F02818 Dementia in other diseases classified elsewhere, unspecified severity, with other behavioral disturbance: Secondary | ICD-10-CM

## 2019-06-29 DIAGNOSIS — E039 Hypothyroidism, unspecified: Secondary | ICD-10-CM | POA: Diagnosis not present

## 2019-06-29 DIAGNOSIS — B372 Candidiasis of skin and nail: Secondary | ICD-10-CM | POA: Diagnosis not present

## 2019-06-29 DIAGNOSIS — R5382 Chronic fatigue, unspecified: Secondary | ICD-10-CM | POA: Diagnosis not present

## 2019-06-29 DIAGNOSIS — G301 Alzheimer's disease with late onset: Secondary | ICD-10-CM

## 2019-06-29 DIAGNOSIS — Z743 Need for continuous supervision: Secondary | ICD-10-CM | POA: Diagnosis not present

## 2019-06-29 DIAGNOSIS — R32 Unspecified urinary incontinence: Secondary | ICD-10-CM | POA: Diagnosis not present

## 2019-06-29 DIAGNOSIS — R2689 Other abnormalities of gait and mobility: Secondary | ICD-10-CM | POA: Diagnosis not present

## 2019-06-29 DIAGNOSIS — F0281 Dementia in other diseases classified elsewhere with behavioral disturbance: Secondary | ICD-10-CM | POA: Diagnosis not present

## 2019-06-29 MED ORDER — LEVOTHYROXINE SODIUM 50 MCG PO TABS: ORAL_TABLET | ORAL | 1 refills | Status: DC

## 2019-06-29 MED ORDER — NYSTATIN 100000 UNIT/GM EX POWD: Freq: Four times a day (QID) | CUTANEOUS | 3 refills | Status: DC

## 2019-06-29 NOTE — Patient Instructions (Signed)
You may try using aspercreme, theragesic, icy hot or salonpas (generics are ok) for the right hip and left knee.  Continue the tylenol and gabapentin.

## 2019-06-29 NOTE — Progress Notes (Signed)
Location:  [facility] clinic  Provider: Dr. Hollace Kinnier   Advanced Directives 06/29/2019  Does Patient Have a Medical Advance Directive? Yes  Type of Paramedic of [place];Out of facility DNR (pink MOST or yellow form)  Does patient want to make changes to medical advance directive? No - Patient declined  Copy of [place] in Chart? Yes - validated most recent copy scanned in chart (See row information)  Pre-existing out of facility DNR order (yellow form or pink MOST form) Yellow form placed in chart (order not valid for inpatient use)     Chief Complaint  Patient presents with  . Medical Management of Chronic Issues    4 Month Follow Up  . Acute Visit    Patients c/o having pain in her left knee and if feels raw,and pain in right hip    HPI: Patient is a 85 y.o. female seen today for medical management of chronic diseases.    Since September, she has been working with family with her memory deficits. Nephew and girlfriend have gotten her a pill box to help her remember her medications. They call her a few times a day to remind her to take them. Nephew girlfriend says new system has been working- she is present today. Family denies any hallucinations or behavioral outbursts.   Has been seeing physical therapy 2-3 times a week. Uses her rollator to ambulate. Life alert present around patients neck today.  Also has a nurse check vitals weekly. No falls or injuries reported. Walk ways in home are free from rugs and cords.  Itchy and moist at skin folds- especially groin and under breast. She does not wears a bra anymore and thinks it is contributing to her areas of moisture. She has tried applying baby powder to those areas with no success. Wears clothing that is cotton or synthetic blend. Has not taken anything for the itching.   Diet- she eats when she has to take her medications. Family reminds her when they make their daily calls.  Meals on  wheels is starting meal delivery for her next month. Family will also prepare meals.   Knee pain- taking gabapentin and celebrex, tylenol. 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    Past Medical History:  Diagnosis Date  . Abdominal pain, epigastric   . Allergic rhinitis due to other allergen   . Allergic rhinitis, cause unspecified   . Dermatophytosis of nail   . Diffuse cystic mastopathy   . Diverticulosis of colon (without mention of hemorrhage)   . DVT (deep venous thrombosis) ([place])   . Dysphagia, unspecified(787.20)   . Esophageal reflux   . Family history of malignant neoplasm of gastrointestinal tract   . Hiatal hernia   . Hyperlipidemia   . Hypertension   . Internal hemorrhoids without mention of complication   . 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 Obesity   . Oral aphthae   . Osteoarthrosis, unspecified whether generalized or localized, unspecified site   . Other abnormal blood chemistry   . Other voice and resonance disorders   . Pain in joint, pelvic region and thigh   . Palpitations   . Pancreatitis   . Peripheral vascular disease, unspecified ([place])   . Schatzki's ring   . Senile osteoporosis   . Torus fracture of fibula   . Unspecified  constipation   . Unspecified hypothyroidism     Past Surgical History:  Procedure Laterality Date  . APPENDECTOMY    . BASAL CELL CARCINOMA EXCISION     right humerus, right temple, left side of nose  . BASAL CELL CARCINOMA EXCISION     chest, left cheek  . CATARACT EXTRACTION, BILATERAL    . CHOLECYSTECTOMY    . keratosis     left arm  . resection of ampullary tumor    . squamous cell cancer     Right leg  . squamous cell cancer left triceps Left    Dr. Lorenza Cambridge  . TONSILLECTOMY AND ADENOIDECTOMY      Allergies  Allergen Reactions  . Penicillins     Has patient had a PCN reaction causing immediate  rash, facial/tongue/throat swelling, SOB or lightheadedness with hypotension: yes Has patient had a PCN reaction causing severe rash involving mucus membranes or skin necrosis: no Has patient had a PCN reaction that required hospitalization: no Has patient had a PCN reaction occurring within the last 10 years: no If all of the above answers are "NO", then may proceed with Cephalosporin use.  . Codeine Nausea And Vomiting  . Lodine [Etodolac] Nausea And Vomiting    Outpatient Encounter Medications as of 06/29/2019  Medication Sig  . acetaminophen (TYLENOL) 500 MG tablet Take 2 tablets (1,000 mg total) by mouth 2 (two) times daily.  Marland Kitchen alendronate (FOSAMAX) 70 MG tablet Take 1 tablet (70 mg total) by mouth once a week. Take with a full glass of water on an empty stomach.  Marland Kitchen aspirin EC 81 MG tablet Take 81 mg by mouth daily.  . bimatoprost (LUMIGAN) 0.01 % SOLN Place 1 drop into both eyes at bedtime.  . Calcium Carb-Cholecalciferol (CALCIUM 600+D3 PO) Take by mouth. 2 by mouth daily  . celecoxib (CELEBREX) 100 MG capsule Take 1 capsule (100 mg total) by mouth daily.  . Cholecalciferol (VITAMIN D3) 50 MCG (2000 UT) capsule Take 2,000 Units by mouth daily.  . DULoxetine (CYMBALTA) 60 MG capsule Take 1 capsule (60 mg total) by mouth daily.  . fluticasone (FLONASE) 50 MCG/ACT nasal spray Place 1 spray into both nostrils daily as needed.   . gabapentin (NEURONTIN) 300 MG capsule TAKE 1 CAPSULE BY MOUTH EVERYDAY AT BEDTIME  . Inulin (FIBER CHOICE PO) Take 5 g by mouth as needed.   Marland Kitchen levothyroxine (SYNTHROID, LEVOTHROID) 50 MCG tablet TAKE 1 TABLET DAILY FOR THYROID SUPPLEMENT  . Multiple Vitamin (MULTIVITAMIN WITH MINERALS) TABS tablet Take 1 tablet by mouth daily.  . Omega-3 Fatty Acids (FISH OIL) 1200 MG CAPS Take 1,200 mg by mouth 3 (three) times daily.   Marland Kitchen omeprazole (PRILOSEC) 20 MG capsule TAKE 1 CAPSULE DAILY TO REDUCE STOMACH ACID.  Marland Kitchen Polyethyl Glycol-Propyl Glycol (SYSTANE ULTRA OP) Place 1  drop into both eyes 3 (three) times daily as needed (dry eyes).    No facility-administered encounter medications on file as of 06/29/2019.     Review of Systems:  Review of Systems  Constitutional: Positive for fatigue. Negative for activity change and appetite change.  HENT: Negative for dental problem and trouble swallowing.   Respiratory: Negative for cough, shortness of breath and wheezing.   Cardiovascular: Positive for leg swelling. Negative for chest pain.  Gastrointestinal: Negative for abdominal pain, constipation, diarrhea and nausea.  Genitourinary: Positive for frequency. Negative for dysuria, hematuria and vaginal bleeding.  Musculoskeletal: Positive for arthralgias and joint swelling.       Right  hip pain and left knee pain  Skin:       Excoriated skin folds  Neurological: Negative for dizziness and light-headedness.  Hematological: Bruises/bleeds easily.  Psychiatric/Behavioral: Negative for dysphoric mood and sleep disturbance. The patient is not nervous/anxious.     Health Maintenance  Topic Date Due  . TETANUS/TDAP  07/11/2028  . INFLUENZA VACCINE  Completed  . DEXA SCAN  Completed  . PNA vac Low Risk Adult  Completed    Physical Exam: Vitals:   06/29/19 1038  BP: 118/72  Pulse: 67  Resp: 20  Temp: (!) 97.5 F (36.4 C)  TempSrc: Oral  SpO2: 95%  Weight: 167 lb 3.2 oz (75.8 kg)  Height: 5\' 3"  (1.6 m)   Body mass index is 29.62 kg/m. Physical Exam Vitals signs reviewed.  Constitutional:      General: She is not in acute distress.    Appearance: Normal appearance. She is normal weight.  HENT:     Head: Normocephalic.  Neck:     Musculoskeletal: Normal range of motion.     Thyroid: No thyroid mass, thyromegaly or thyroid tenderness.  Cardiovascular:     Rate and Rhythm: Normal rate and regular rhythm.     Heart sounds: Normal heart sounds. No murmur.  Pulmonary:     Effort: Pulmonary effort is normal. No respiratory distress.     Breath  sounds: Normal breath sounds. No wheezing.  Abdominal:     General: Bowel sounds are normal.     Palpations: Abdomen is soft.     Tenderness: There is no abdominal tenderness.  Musculoskeletal:     Right lower leg: 2+ Edema present.     Left lower leg: 2+ Edema present.  Skin:    General: Skin is warm.     Capillary Refill: Capillary refill takes 2 to 3 seconds.       Neurological:     Mental Status: She is alert. Mental status is at baseline.     Sensory: Sensory deficit present.     Motor: Weakness present.     Gait: Gait abnormal.  Psychiatric:        Attention and Perception: Attention and perception normal.        Mood and Affect: Mood normal. Affect is flat.        Speech: Speech normal.        Behavior: Behavior normal. Behavior is cooperative.        Thought Content: Thought content normal.        Cognition and Memory: Memory is impaired.        Judgment: Judgment normal.     Labs reviewed: Basic Metabolic Panel: Recent Labs    10/30/18 1354  NA 141  K 3.9  CL 104  CO2 27  GLUCOSE 123*  BUN 17  CREATININE 0.75  CALCIUM 9.6  TSH 2.06   Liver Function Tests: Recent Labs    10/30/18 1354  AST 19  ALT 14  BILITOT 0.5  PROT 6.8   No results for input(s): LIPASE, AMYLASE in the last 8760 hours. No results for input(s): AMMONIA in the last 8760 hours. CBC: No results for input(s): WBC, NEUTROABS, HGB, HCT, MCV, PLT in the last 8760 hours. Lipid Panel: No results for input(s): CHOL, HDL, LDLCALC, TRIG, CHOLHDL, LDLDIRECT in the last 8760 hours. Lab Results  Component Value Date   HGBA1C 5.3 07/01/2017    Procedures since last visit: No results found.  Assessment/Plan 1. Late onset Alzheimers disease with  behavioral disturbance ([place])  - family denies any recent behavioral outbursts or hallucinations - at this time I believe she is stable and has improved from last visit since she is taking her medications daily with the assistance of family -  recommend annual wellness visit to evaluate new MMSE - basic metabolic panel- today  2. Delusions ([place])  - stable at this time  3. Candidal skin infection - moisture and erythema located under skin folds - Nystatin 100000 Unit/GM- apply to skin folds 4 times a day daily  - keep area dry and clean - recommend cotton clothing  4. Hypothyroidism, unspecified type - family reports increased fatigue during the day - TSH- today  5. 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

## 2019-06-30 LAB — TSH: TSH: 1.65 mIU/L (ref 0.40–4.50)

## 2019-06-30 LAB — CBC WITH DIFFERENTIAL/PLATELET
Absolute Monocytes: 382 cells/uL (ref 200–950)
Basophils Absolute: 40 cells/uL (ref 0–200)
Basophils Relative: 0.7 %
Eosinophils Absolute: 103 cells/uL (ref 15–500)
Eosinophils Relative: 1.8 %
HCT: 36.1 % (ref 35.0–45.0)
Hemoglobin: 11.9 g/dL (ref 11.7–15.5)
Lymphs Abs: 2012 cells/uL (ref 850–3900)
MCH: 32.5 pg (ref 27.0–33.0)
MCHC: 33 g/dL (ref 32.0–36.0)
MCV: 98.6 fL (ref 80.0–100.0)
MPV: 11 fL (ref 7.5–12.5)
Monocytes Relative: 6.7 %
Neutro Abs: 3164 cells/uL (ref 1500–7800)
Neutrophils Relative %: 55.5 %
Platelets: 167 10*3/uL (ref 140–400)
RBC: 3.66 10*6/uL — ABNORMAL LOW (ref 3.80–5.10)
RDW: 11.6 % (ref 11.0–15.0)
Total Lymphocyte: 35.3 %
WBC: 5.7 10*3/uL (ref 3.8–10.8)

## 2019-06-30 LAB — BASIC METABOLIC PANEL
BUN: 16 mg/dL (ref 7–25)
CO2: 29 mmol/L (ref 20–32)
Calcium: 9.3 mg/dL (ref 8.6–10.4)
Chloride: 106 mmol/L (ref 98–110)
Creat: 0.64 mg/dL (ref 0.60–0.88)
Glucose, Bld: 97 mg/dL (ref 65–139)
Potassium: 4.3 mmol/L (ref 3.5–5.3)
Sodium: 143 mmol/L (ref 135–146)

## 2019-07-06 DIAGNOSIS — R2689 Other abnormalities of gait and mobility: Secondary | ICD-10-CM | POA: Diagnosis not present

## 2019-07-06 DIAGNOSIS — G301 Alzheimer's disease with late onset: Secondary | ICD-10-CM | POA: Diagnosis not present

## 2019-07-06 DIAGNOSIS — R32 Unspecified urinary incontinence: Secondary | ICD-10-CM | POA: Diagnosis not present

## 2019-07-06 DIAGNOSIS — Z743 Need for continuous supervision: Secondary | ICD-10-CM | POA: Diagnosis not present

## 2019-07-06 DIAGNOSIS — F0281 Dementia in other diseases classified elsewhere with behavioral disturbance: Secondary | ICD-10-CM | POA: Diagnosis not present

## 2019-07-08 DIAGNOSIS — R32 Unspecified urinary incontinence: Secondary | ICD-10-CM | POA: Diagnosis not present

## 2019-07-08 DIAGNOSIS — F0281 Dementia in other diseases classified elsewhere with behavioral disturbance: Secondary | ICD-10-CM | POA: Diagnosis not present

## 2019-07-08 DIAGNOSIS — G301 Alzheimer's disease with late onset: Secondary | ICD-10-CM | POA: Diagnosis not present

## 2019-07-08 DIAGNOSIS — R2689 Other abnormalities of gait and mobility: Secondary | ICD-10-CM | POA: Diagnosis not present

## 2019-07-08 DIAGNOSIS — Z743 Need for continuous supervision: Secondary | ICD-10-CM | POA: Diagnosis not present

## 2019-07-13 ENCOUNTER — Ambulatory Visit (INDEPENDENT_AMBULATORY_CARE_PROVIDER_SITE_OTHER): Payer: Medicare Other | Admitting: Nurse Practitioner

## 2019-07-13 ENCOUNTER — Encounter: Payer: Self-pay | Admitting: Nurse Practitioner

## 2019-07-13 ENCOUNTER — Other Ambulatory Visit: Payer: Self-pay

## 2019-07-13 ENCOUNTER — Ambulatory Visit: Payer: Self-pay

## 2019-07-13 VITALS — BP 112/78 | HR 57 | Temp 97.3°F | Ht 63.0 in | Wt 164.0 lb

## 2019-07-13 DIAGNOSIS — E039 Hypothyroidism, unspecified: Secondary | ICD-10-CM

## 2019-07-13 DIAGNOSIS — M542 Cervicalgia: Secondary | ICD-10-CM

## 2019-07-13 DIAGNOSIS — G8929 Other chronic pain: Secondary | ICD-10-CM

## 2019-07-13 DIAGNOSIS — E2839 Other primary ovarian failure: Secondary | ICD-10-CM

## 2019-07-13 DIAGNOSIS — M25562 Pain in left knee: Secondary | ICD-10-CM

## 2019-07-13 DIAGNOSIS — F419 Anxiety disorder, unspecified: Secondary | ICD-10-CM

## 2019-07-13 DIAGNOSIS — Z0000 Encounter for general adult medical examination without abnormal findings: Secondary | ICD-10-CM | POA: Diagnosis not present

## 2019-07-13 MED ORDER — CELECOXIB 100 MG PO CAPS: 100.0000 mg | ORAL_CAPSULE | Freq: Every day | ORAL | 1 refills | Status: DC

## 2019-07-13 MED ORDER — OMEPRAZOLE 20 MG PO CPDR: DELAYED_RELEASE_CAPSULE | ORAL | 1 refills | Status: DC

## 2019-07-13 NOTE — Patient Instructions (Signed)
Tonya Russell , Thank you for taking time to come for your Medicare Wellness Visit. I appreciate your ongoing commitment to your health goals. Please review the following plan we discussed and let me know if I can assist you in the future.   Screening recommendations/referrals: Colonoscopy aged out Mammogram aged out Bone Density ordered today Recommended yearly ophthalmology/optometry visit for glaucoma screening and checkup Recommended yearly dental visit for hygiene and checkup  Vaccinations: Influenza vaccine up to date pneumococcal vaccine up to date Tdap vaccine up to date Shingles vaccine up todate    Advanced directives: on file  Conditions/risks identified: progressive memory loss, weight loss and mobility decline.  Next appointment: 1 year.    Preventive Care 83 Years and Older, Female Preventive care refers to lifestyle choices and visits with your health care provider that can promote health and wellness. What does preventive care include?  A yearly physical exam. This is also called an annual well check.  Dental exams once or twice a year.  Routine eye exams. Ask your health care provider how often you should have your eyes checked.  Personal lifestyle choices, including:  Daily care of your teeth and gums.  Regular physical activity.  Eating a healthy diet.  Avoiding tobacco and drug use.  Limiting alcohol use.  Practicing safe sex.  Taking low-dose aspirin every day.  Taking vitamin and mineral supplements as recommended by your health care provider. What happens during an annual well check? The services and screenings done by your health care provider during your annual well check will depend on your age, overall health, lifestyle risk factors, and family history of disease. Counseling  Your health care provider may ask you questions about your:  Alcohol use.  Tobacco use.  Drug use.  Emotional well-being.  Home and relationship well-being.   Sexual activity.  Eating habits.  History of falls.  Memory and ability to understand (cognition).  Work and work Statistician.  Reproductive health. Screening  You may have the following tests or measurements:  Height, weight, and BMI.  Blood pressure.  Lipid and cholesterol levels. These may be checked every 5 years, or more frequently if you are over 61 years old.  Skin check.  Lung cancer screening. You may have this screening every year starting at age 48 if you have a 30-pack-year history of smoking and currently smoke or have quit within the past 15 years.  Fecal occult blood test (FOBT) of the stool. You may have this test every year starting at age 59.  Flexible sigmoidoscopy or colonoscopy. You may have a sigmoidoscopy every 5 years or a colonoscopy every 10 years starting at age 70.  Hepatitis C blood test.  Hepatitis B blood test.  Sexually transmitted disease (STD) testing.  Diabetes screening. This is done by checking your blood sugar (glucose) after you have not eaten for a while (fasting). You may have this done every 1-3 years.  Bone density scan. This is done to screen for osteoporosis. You may have this done starting at age 63.  Mammogram. This may be done every 1-2 years. Talk to your health care provider about how often you should have regular mammograms. Talk with your health care provider about your test results, treatment options, and if necessary, the need for more tests. Vaccines  Your health care provider may recommend certain vaccines, such as:  Influenza vaccine. This is recommended every year.  Tetanus, diphtheria, and acellular pertussis (Tdap, Td) vaccine. You may need a Td booster  every 10 years.  Zoster vaccine. You may need this after age 39.  Pneumococcal 13-valent conjugate (PCV13) vaccine. One dose is recommended after age 23.  Pneumococcal polysaccharide (PPSV23) vaccine. One dose is recommended after age 25. Talk to your  health care provider about which screenings and vaccines you need and how often you need them. This information is not intended to replace advice given to you by your health care provider. Make sure you discuss any questions you have with your health care provider. Document Released: 09/23/2015 Document Revised: 05/16/2016 Document Reviewed: 06/28/2015 Elsevier Interactive Patient Education  2017 [place] Prevention in the Home Falls can cause injuries. They can happen to people of all ages. There are many things you can do to make your home safe and to help prevent falls. What can I do on the outside of my home?  Regularly fix the edges of walkways and driveways and fix any cracks.  Remove anything that might make you trip as you walk through a door, such as a raised step or threshold.  Trim any bushes or trees on the path to your home.  Use bright outdoor lighting.  Clear any walking paths of anything that might make someone trip, such as rocks or tools.  Regularly check to see if handrails are loose or broken. Make sure that both sides of any steps have handrails.  Any raised decks and porches should have guardrails on the edges.  Have any leaves, snow, or ice cleared regularly.  Use sand or salt on walking paths during winter.  Clean up any spills in your garage right away. This includes oil or grease spills. What can I do in the bathroom?  Use night lights.  Install grab bars by the toilet and in the tub and shower. Do not use towel bars as grab bars.  Use non-skid mats or decals in the tub or shower.  If you need to sit down in the shower, use a plastic, non-slip stool.  Keep the floor dry. Clean up any water that spills on the floor as soon as it happens.  Remove soap buildup in the tub or shower regularly.  Attach bath mats securely with double-sided non-slip rug tape.  Do not have throw rugs and other things on the floor that can make you trip. What  can I do in the bedroom?  Use night lights.  Make sure that you have a light by your bed that is easy to reach.  Do not use any sheets or blankets that are too big for your bed. They should not hang down onto the floor.  Have a firm chair that has side arms. You can use this for support while you get dressed.  Do not have throw rugs and other things on the floor that can make you trip. What can I do in the kitchen?  Clean up any spills right away.  Avoid walking on wet floors.  Keep items that you use a lot in easy-to-reach places.  If you need to reach something above you, use a strong step stool that has a grab bar.  Keep electrical cords out of the way.  Do not use floor polish or wax that makes floors slippery. If you must use wax, use non-skid floor wax.  Do not have throw rugs and other things on the floor that can make you trip. What can I do with my stairs?  Do not leave any items on the stairs.  Make  sure that there are handrails on both sides of the stairs and use them. Fix handrails that are broken or loose. Make sure that handrails are as long as the stairways.  Check any carpeting to make sure that it is firmly attached to the stairs. Fix any carpet that is loose or worn.  Avoid having throw rugs at the top or bottom of the stairs. If you do have throw rugs, attach them to the floor with carpet tape.  Make sure that you have a light switch at the top of the stairs and the bottom of the stairs. If you do not have them, ask someone to add them for you. What else can I do to help prevent falls?  Wear shoes that:  Do not have high heels.  Have rubber bottoms.  Are comfortable and fit you well.  Are closed at the toe. Do not wear sandals.  If you use a stepladder:  Make sure that it is fully opened. Do not climb a closed stepladder.  Make sure that both sides of the stepladder are locked into place.  Ask someone to hold it for you, if possible.   Clearly mark and make sure that you can see:  Any grab bars or handrails.  First and last steps.  Where the edge of each step is.  Use tools that help you move around (mobility aids) if they are needed. These include:  Canes.  Walkers.  Scooters.  Crutches.  Turn on the lights when you go into a dark area. Replace any light bulbs as soon as they burn out.  Set up your furniture so you have a clear path. Avoid moving your furniture around.  If any of your floors are uneven, fix them.  If there are any pets around you, be aware of where they are.  Review your medicines with your doctor. Some medicines can make you feel dizzy. This can increase your chance of falling. Ask your doctor what other things that you can do to help prevent falls. This information is not intended to replace advice given to you by your health care provider. Make sure you discuss any questions you have with your health care provider. Document Released: 06/23/2009 Document Revised: 02/02/2016 Document Reviewed: 10/01/2014 Elsevier Interactive Patient Education  2017 [employer].

## 2019-07-13 NOTE — Progress Notes (Signed)
Subjective:   Tonya Russell is a 85 y.o. female who presents for Medicare Annual (Subsequent) preventive examination.  Review of Systems:   Cardiac Risk Factors include: sedentary lifestyle;hypertension;dyslipidemia     Objectiv     Vitals: BP 112/78   Pulse (!) 57   Temp (!) 97.3 F (36.3 C) (Temporal)   Ht 5\' 3"  (1.6 m)   Wt 164 lb (74.4 kg)   SpO2 94%   BMI 29.05 kg/m   Body mass index is 29.05 kg/m.  Advanced Directives 07/13/2019 06/29/2019 06/04/2019 10/30/2018 07/11/2018 04/28/2018 03/12/2018  Does Patient Have a Medical Advance Directive? Yes Yes Yes Yes Yes Yes Yes  Type of Paramedic of [place];Living will [place];Out of facility DNR (pink MOST or yellow form) Healthcare Power of [place];Living will;Out of facility DNR (pink MOST or yellow form) [place];Living will;Out of facility DNR (pink MOST or yellow form) [place];Living will;Out of facility DNR (pink MOST or yellow form) [place];Living will;Out of facility DNR (pink MOST or yellow form)  Does patient want to make changes to medical advance directive? No - Patient declined No - Patient declined No - Patient declined Yes (MAU/Ambulatory/Procedural Areas - Information given) No - Patient declined - -  Copy of [place] in Chart? Yes - validated most recent copy scanned in chart (See row information) Yes - validated most recent copy scanned in chart (See row information) Yes - validated most recent copy scanned in chart (See row information) Yes - validated most recent copy scanned in chart (See row information) Yes Yes Yes  Pre-existing out of facility DNR order (yellow form or pink MOST form) - Yellow form placed in chart (order not valid for inpatient use) - - Yellow form placed in chart (order not valid for inpatient use) - Yellow form placed in chart (order not valid for  inpatient use)    Tobacco Social History   Tobacco Use  Smoking Status Never Smoker  Smokeless Tobacco Never Used     Counseling given: Not Answered   Clinical Intake:  Pre-visit preparation completed: Yes  Pain : No/denies pain     BMI - recorded: 29.05 Nutritional Status: BMI 25 -29 Overweight Nutritional Risks: Unintentional weight loss, Nausea/ vomitting/ diarrhea Diabetes: No  How often do you need to have someone help you when you read instructions, pamphlets, or other written materials from your doctor or pharmacy?: 5 - Always What is the last grade level you completed in school?: 12th grade  Interpreter Needed?: No     Past Medical History:  Diagnosis Date  . Abdominal pain, epigastric   . Allergic rhinitis due to other allergen   . Allergic rhinitis, cause unspecified   . Dermatophytosis of nail   . Diffuse cystic mastopathy   . Diverticulosis of colon (without mention of hemorrhage)   . DVT (deep venous thrombosis) ([place])   . Dysphagia, unspecified(787.20)   . Esophageal reflux   . Family history of malignant neoplasm of gastrointestinal tract   . Hiatal hernia   . Hyperlipidemia   . Hypertension   . Internal hemorrhoids without mention of complication   . 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 Obesity   . Oral aphthae   . Osteoarthrosis, unspecified whether generalized or localized, unspecified site   . Other abnormal blood chemistry   . Other voice and resonance disorders   . Pain  in joint, pelvic region and thigh   . Palpitations   . Pancreatitis   . Peripheral vascular disease, unspecified ([place])   . Schatzki's ring   . Senile osteoporosis   . Torus fracture of fibula   . Unspecified constipation   . Unspecified hypothyroidism    Past Surgical History:  Procedure Laterality Date  . APPENDECTOMY    . BASAL CELL CARCINOMA EXCISION     right humerus, right temple, left side of nose  . BASAL CELL CARCINOMA EXCISION     chest,  left cheek  . CATARACT EXTRACTION, BILATERAL    . CHOLECYSTECTOMY    . keratosis     left arm  . resection of ampullary tumor    . squamous cell cancer     Right leg  . squamous cell cancer left triceps Left    Dr. Lorenza [place]  . TONSILLECTOMY AND ADENOIDECTOMY     Family History  Problem Relation Age of Onset  . Cancer Mother        spindle cell carcinoma on neck  . Colon cancer Father 13  . Breast cancer Sister   . Colon polyps Sister   . Heart disease Brother   . Breast cancer Other        neiece x 3  . Diabetes Niece   . Esophageal cancer Neg Hx   . Pancreatic cancer Neg Hx   . Kidney disease Neg Hx   . Liver disease Neg Hx    Social History   Socioeconomic History  . Marital status: Single    Spouse name: Not on file  . Number of children: Not on file  . Years of education: Not on file  . Highest education level: Not on file  Occupational History  . Occupation: Retired  Scientific laboratory technician  . Financial resource strain: Not hard at all  . Food insecurity    Worry: Never true    Inability: Never true  . Transportation needs    Medical: No    Non-medical: No  Tobacco Use  . Smoking status: Never Smoker  . Smokeless tobacco: Never Used  Substance and Sexual Activity  . Alcohol use: No  . Drug use: No  . Sexual activity: Never  Lifestyle  . Physical activity    Days per week: 0 days    Minutes per session: 0 min  . Stress: Only a little  Relationships  . Social Herbalist on phone: More than three times a week    Gets together: Twice a week    Attends religious service: Never    Active member of club or organization: No    Attends meetings of clubs or organizations: Never    Relationship status: Never married  Other Topics Concern  . Not on file  Social History Narrative  . Not on file    Outpatient Encounter Medications as of 07/13/2019  Medication Sig  . acetaminophen (TYLENOL) 500 MG tablet Take 2 tablets (1,000 mg total) by mouth 2 (two)  times daily.  Marland Kitchen alendronate (FOSAMAX) 70 MG tablet Take 1 tablet (70 mg total) by mouth once a week. Take with a full glass of water on an empty stomach.  Marland Kitchen aspirin EC 81 MG tablet Take 81 mg by mouth daily.  . bimatoprost (LUMIGAN) 0.01 % SOLN Place 1 drop into both eyes at bedtime.  . Calcium Carb-Cholecalciferol (CALCIUM 600+D3 PO) Take by mouth. 2 by mouth daily  . celecoxib (CELEBREX) 100 MG capsule  Take 1 capsule (100 mg total) by mouth daily.  . Cholecalciferol (VITAMIN D3) 50 MCG (2000 UT) capsule Take 2,000 Units by mouth daily.  . DULoxetine (CYMBALTA) 60 MG capsule Take 1 capsule (60 mg total) by mouth daily.  . fluticasone (FLONASE) 50 MCG/ACT nasal spray Place 1 spray into both nostrils daily as needed.   . gabapentin (NEURONTIN) 300 MG capsule TAKE 1 CAPSULE BY MOUTH EVERYDAY AT BEDTIME  . Inulin (FIBER CHOICE PO) Take 5 g by mouth as needed.   Marland Kitchen levothyroxine (SYNTHROID) 50 MCG tablet TAKE 1 TABLET DAILY FOR THYROID SUPPLEMENT  . Multiple Vitamin (MULTIVITAMIN WITH MINERALS) TABS tablet Take 1 tablet by mouth daily.  Marland Kitchen nystatin (MYCOSTATIN/NYSTOP) powder Apply topically 4 (four) times daily.  . Omega-3 Fatty Acids (FISH OIL) 1200 MG CAPS Take 1,200 mg by mouth 3 (three) times daily.   Marland Kitchen omeprazole (PRILOSEC) 20 MG capsule TAKE 1 CAPSULE DAILY TO REDUCE STOMACH ACID.  Marland Kitchen Polyethyl Glycol-Propyl Glycol (SYSTANE ULTRA OP) Place 1 drop into both eyes 3 (three) times daily as needed (dry eyes).   . [DISCONTINUED] celecoxib (CELEBREX) 100 MG capsule Take 1 capsule (100 mg total) by mouth daily.  . [DISCONTINUED] omeprazole (PRILOSEC) 20 MG capsule TAKE 1 CAPSULE DAILY TO REDUCE STOMACH ACID.   No facility-administered encounter medications on file as of 07/13/2019.     Activities of Daily Living In your present state of health, do you have any difficulty performing the following activities: 07/13/2019  Hearing? Y  Vision? Y  Difficulty concentrating or making decisions? Y  Walking  or climbing stairs? Y  Dressing or bathing? Y  Doing errands, shopping? Y  Comment family helps  Preparing Food and eating ? Y  Comment meals on wheels starting  Using the Toilet? N  In the past six months, have you accidently leaked urine? Y  Do you have problems with loss of bowel control? Y  Managing your Medications? Y  Managing your Finances? Y  Housekeeping or managing your Housekeeping? Y  Some recent data might be hidden    Patient Care Team: Gayland Curry, DO as PCP - General (Geriatric Medicine) [name], MD as Consulting Physician (Gastroenterology) Lelon Perla, MD as Consulting Physician (Cardiology) Druscilla Brownie, MD as Consulting Physician (Dermatology) [name], MD as Consulting Physician (Ophthalmology)    Assessment:   This is a routine wellness examination for [facility].  Exercise Activities and Dietary recommendations Current Exercise Habits: The patient does not participate in regular exercise at present, Exercise limited by: psychological condition(s)  Goals    . Exercise More     Starting 07/04/16, I will attempt to exercise at least twice a week, walking or using my pedaling machine.     . Maintain Lifestyle     Starting today pt will maintain lifestyle.        Fall Risk Fall Risk  07/13/2019 06/29/2019 02/26/2019 10/30/2018 07/11/2018  Falls in the past year? 0 0 0 0 1  Number falls in past yr: 0 - 0 0 0  Injury with Fall? 0 - 0 0 1  Comment - - - - -  Risk Factor Category  - - - - -  Risk for fall due to : - - - - History of fall(s)  Follow up - - - - Falls prevention discussed   Is the patient's home free of loose throw rugs in walkways, pet beds, electrical cords, etc?   yes      Grab  bars in the bathroom? no      Handrails on the stairs?   no stairs       Adequate lighting?   yes  Timed Get Up and Go performed: na  Depression Screen PHQ 2/9 Scores 07/13/2019 06/29/2019 02/26/2019 07/11/2018  PHQ - 2 Score 0 0 0 0      Cognitive Function MMSE - Mini Mental State Exam 07/13/2019 01/07/2018 03/27/2017 03/27/2017 07/04/2016  Orientation to time 3 2 5 5 5   Orientation to Place 2 5 5 5 5   Registration 3 3 3 3 3   Attention/ Calculation 5 5 5 5 5   Recall 1 2 3 3 3   Language- name 2 objects 2 2 2 2 2   Language- repeat 1 1 1 1 1   Language- follow 3 step command 2 3 3 3 3   Language- read & follow direction 0 1 1 1 1   Write a sentence 1 1 1 1 1   Copy design 0 0 1 1 1   Total score 20 25 30 30 30         Immunization History  Administered Date(s) Administered  . Fluad Quad(high Dose 65+) 06/04/2019  . Influenza, High Dose Seasonal PF 05/09/2017, 06/19/2018  . Influenza,inj,Quad PF,6+ Mos 06/17/2013, 06/23/2014, 09/07/2015, 07/04/2016  . Influenza-Unspecified 07/09/2012  . Pneumococcal Conjugate-13 07/11/2018  . Pneumococcal Polysaccharide-23 07/04/2016  . Pneumococcal-Unspecified 09/10/1992  . Tdap 09/11/2007, 07/11/2018  . Zoster 11/28/2010  . Zoster Recombinat (Shingrix) 07/09/2017, 10/23/2017    Qualifies for Shingles Vaccine? yes  Screening Tests Health Maintenance  Topic Date Due  . TETANUS/TDAP  07/11/2028  . INFLUENZA VACCINE  Completed  . DEXA SCAN  Completed  . PNA vac Low Risk Adult  Completed    Cancer Screenings: Lung: Low Dose CT Chest recommended if Age 44-80 years, 30 pack-year currently smoking OR have quit w/in 15years. Patient does not qualify. Breast:  Up to date on Mammogram? Aged out Up to date of Bone Density/Dexa? No Colorectal: aged out  Additional Screenings:  Hepatitis C Screening: na     Plan:      I have personally reviewed and noted the following in the patient's chart:   . Medical and social history . Use of alcohol, tobacco or illicit drugs  . Current medications and supplements . Functional ability and status . Nutritional status . Physical activity . Advanced directives . List of other physicians . Hospitalizations, surgeries, and ER visits in  previous 12 months . Vitals . Screenings to include cognitive, depression, and falls . Referrals and appointments  In addition, I have reviewed and discussed with patient certain preventive protocols, quality metrics, and best practice recommendations. A written personalized care plan for preventive services as well as general preventive health recommendations were provided to patient.     Lauree Chandler, NP  07/13/2019

## 2019-07-14 ENCOUNTER — Other Ambulatory Visit: Payer: Self-pay | Admitting: Internal Medicine

## 2019-08-04 ENCOUNTER — Other Ambulatory Visit: Payer: Self-pay | Admitting: *Deleted

## 2019-08-04 ENCOUNTER — Encounter: Payer: Self-pay | Admitting: *Deleted

## 2019-08-04 NOTE — Patient Outreach (Signed)
[place] [facility]) Care Management  08/04/2019  Jaella Stamer [birth year] [id number]   Referral Date: 07/30/2019 Referral Source: [facility] Referral Reason: Need social work referral to evaluate for Medicaid eligibility, community resources, and advanced directives Insurance: Next [employer] attempt # 1, unsuccessful.  Referral received from [facility] to assess member for needs based on above referral reason.  Noted that contact person is [name], [phone].  Per chart, she has history of HTN, PVD, Hypothyroidism, and hyperlipidemia.  Call placed to [name], no answer.  HIPAA compliant voice message left.  Plan: RN CM will send unsuccessful outreach letter and follow up within the next 3-4 business days.  Valente David, [place], MSN [place] [phone]

## 2019-08-04 NOTE — Telephone Encounter (Signed)
This encounter was created in error - please disregard.

## 2019-08-07 DIAGNOSIS — F0281 Dementia in other diseases classified elsewhere with behavioral disturbance: Secondary | ICD-10-CM | POA: Diagnosis not present

## 2019-08-07 DIAGNOSIS — R32 Unspecified urinary incontinence: Secondary | ICD-10-CM

## 2019-08-07 DIAGNOSIS — R2689 Other abnormalities of gait and mobility: Secondary | ICD-10-CM | POA: Diagnosis not present

## 2019-08-07 DIAGNOSIS — G301 Alzheimer's disease with late onset: Secondary | ICD-10-CM | POA: Diagnosis not present

## 2019-08-07 DIAGNOSIS — Z9181 History of falling: Secondary | ICD-10-CM

## 2019-08-07 DIAGNOSIS — Z743 Need for continuous supervision: Secondary | ICD-10-CM | POA: Diagnosis not present

## 2019-08-10 ENCOUNTER — Other Ambulatory Visit: Payer: Self-pay | Admitting: *Deleted

## 2019-08-10 NOTE — Patient Outreach (Signed)
[place] [facility]) Care Management  08/10/2019  Tonya Russell [birth year] [id number]   Referral Date: 07/30/2019 Referral Source: [facility] Referral Reason: Need social work referral to evaluate for Medicaid eligibility, community resources, and advanced directives Insurance: Next [employer] attempt #2, successful.  Referral received from [facility] to assess member for needs based on above referral reason.  Noted that contact person is Tonya Russell, [phone].  Per chart, she has history of HTN, PVD, Hypothyroidism, and hyperlipidemia.  Voice message received back from nephew [name] after unsuccessful outreach attempt last week.  Call placed back to [name], member's identity verified.  [facility] care management services explained.  He report he and his daughters are member's primary caregivers.  She lives alone but receive visits throughout the week from her family and multiple calls a day to check in.  He report the member no longer cooks on the stove but is able to use the microwave and/or toaster oven.  She is also receiving MOW's.  Family provide transportation to MD appointments as well as to the grocery store weekly.    She has been assessed by [facility], nurse making visits periodically.  She is active with home health for PT and has a home health aide.  He is not sure how long the aide will be coming and is interested in having personal care aide visit daily.  They are interested in applying for Medicaid in order to have this service paid for, also interested in changing her current advanced directives and POA.    Denies any nursing concerns at this time, state she has been stable.  No ED visits since 2019, no hospital admissions in several years.  Will place referral to social worker, will not open to nursing but will send this care manager's contact information in case needs change.  Valente David, [place], MSN [place] [phone]

## 2019-08-11 DIAGNOSIS — R2689 Other abnormalities of gait and mobility: Secondary | ICD-10-CM | POA: Diagnosis not present

## 2019-08-11 DIAGNOSIS — R32 Unspecified urinary incontinence: Secondary | ICD-10-CM | POA: Diagnosis not present

## 2019-08-11 DIAGNOSIS — Z9181 History of falling: Secondary | ICD-10-CM | POA: Diagnosis not present

## 2019-08-11 DIAGNOSIS — Z743 Need for continuous supervision: Secondary | ICD-10-CM | POA: Diagnosis not present

## 2019-08-11 DIAGNOSIS — G301 Alzheimer's disease with late onset: Secondary | ICD-10-CM | POA: Diagnosis not present

## 2019-08-11 DIAGNOSIS — F0281 Dementia in other diseases classified elsewhere with behavioral disturbance: Secondary | ICD-10-CM | POA: Diagnosis not present

## 2019-08-12 ENCOUNTER — Other Ambulatory Visit: Payer: Self-pay

## 2019-08-12 NOTE — Patient Outreach (Signed)
[facility]) Care Management  08/12/2019  Tonya Russell 03/14/1925 [id number]   Referral from [facility] requesting help with changing Advanced Directives and Medicaid application in effort to get PCS for increased support in the home. Per referral, patient lives alone, already receiving MOWs. Nephew Tonya Russell is contact person [phone]). Successful outreach to nephew today. Patient currently receiving aide services through [facility].  Informed nephew that these services will only be covered by Medicare while in conjunction with skilled service.  Talked with patient about private pay aide services.  Informed patient that pcs are only covered by Medicaid. He is unsure if patient has ever applied.  Discussed Medicaid eligibility. Management of patient's finances is being transitioned to nephew so he is not exactly sure of patient's total income at this time.  He stated that he will have a better understanding very soon as they plan to get him added to patient's accounts, etc this week.  Medicaid application was emailed to him as requested.  Nephew stated that he and patient's PCP discussed the possible need for long term care during last visit.  He and patient wish for her to remain in her home as long as possible but family members have concerns about patient's safety.  Nephew stated that he, patient, and other family members intend to further discuss long term plans for patient this week.   Inquired about the need for assistance with Advance Directives, however, nephew stated that these documents were already completed with [name] from [facility].  Will follow up with nephew next week regarding long term plan for patient and provide additional assistance if needed.  Ronn Melena, BSW Social Worker [phone]

## 2019-08-19 ENCOUNTER — Other Ambulatory Visit: Payer: Self-pay

## 2019-08-19 ENCOUNTER — Ambulatory Visit: Payer: Self-pay

## 2019-08-19 NOTE — Patient Outreach (Signed)
[place] [facility]) Care Management  08/19/2019  Tonya Russell 06-24-1925 [id number]   Attempted to contact patient's nephew today to follow up on social work referral received from [place].  Left voicemail message.  Will attempt to reach again within four business days if not return call before then.  Ronn Melena, BSW Social Worker [phone]

## 2019-08-25 ENCOUNTER — Ambulatory Visit: Payer: Self-pay

## 2019-08-27 ENCOUNTER — Other Ambulatory Visit: Payer: Self-pay

## 2019-08-27 NOTE — Patient Outreach (Signed)
[place] [facility]) Care Management  08/27/2019  Tonya Russell 18-Oct-1924 [id number]   Received return call from patient's nephew.  Nephew is working to complete [employer] application and reports that patient recently located some of the documentation to be submitted with application.  Per nephew, the need for long term care was discussed at patient's recent PCP visit.  Nephew stated that patient will need long term care "sooner than later" due to cognitive challenges.  Nephew has already started looking into placement options and is aware that FL2 will need to be completed by provider when/if facility is located.  Nephew denied the need for further assistance at this time.  Closing case at this time but did encourage him to call if additional needs arise.   Ronn Melena, BSW Social Worker [phone]

## 2019-09-09 ENCOUNTER — Other Ambulatory Visit: Payer: Self-pay | Admitting: *Deleted

## 2019-09-09 MED ORDER — GABAPENTIN 300 MG PO CAPS: ORAL_CAPSULE | ORAL | 1 refills | Status: DC

## 2019-09-09 NOTE — Telephone Encounter (Signed)
Patient nephew called requesting refill Faxed to pharmacy.

## 2019-10-09 ENCOUNTER — Ambulatory Visit
Admission: RE | Admit: 2019-10-09 | Discharge: 2019-10-09 | Disposition: A | Discharge status: Home / Self Care | Payer: Medicare Other | Source: Ambulatory Visit | Attending: Nurse Practitioner | Admitting: Nurse Practitioner

## 2019-10-09 ENCOUNTER — Other Ambulatory Visit: Payer: Self-pay

## 2019-10-09 DIAGNOSIS — Z0000 Encounter for general adult medical examination without abnormal findings: Secondary | ICD-10-CM

## 2019-10-09 DIAGNOSIS — M8588 Other specified disorders of bone density and structure, other site: Secondary | ICD-10-CM | POA: Diagnosis not present

## 2019-10-09 DIAGNOSIS — E2839 Other primary ovarian failure: Secondary | ICD-10-CM

## 2019-10-09 DIAGNOSIS — Z780 Asymptomatic menopausal state: Secondary | ICD-10-CM | POA: Diagnosis not present

## 2019-10-09 DIAGNOSIS — M810 Age-related osteoporosis without current pathological fracture: Secondary | ICD-10-CM | POA: Diagnosis not present

## 2019-10-09 NOTE — Progress Notes (Signed)
Please notify patient:  She has osteoporosis--thin bones, especially the hips.  I need her to continue with her fosamax weekly and her calcium with D.  Walking for exercise with her walker is also a good idea as she can tolerate safely.

## 2019-10-14 ENCOUNTER — Encounter: Payer: Self-pay | Admitting: *Deleted

## 2019-10-21 DIAGNOSIS — H04123 Dry eye syndrome of bilateral lacrimal glands: Secondary | ICD-10-CM | POA: Diagnosis not present

## 2019-10-21 DIAGNOSIS — H401134 Primary open-angle glaucoma, bilateral, indeterminate stage: Secondary | ICD-10-CM | POA: Diagnosis not present

## 2019-10-21 DIAGNOSIS — H43813 Vitreous degeneration, bilateral: Secondary | ICD-10-CM | POA: Diagnosis not present

## 2019-10-21 DIAGNOSIS — H524 Presbyopia: Secondary | ICD-10-CM | POA: Diagnosis not present

## 2019-10-27 ENCOUNTER — Telehealth: Payer: Self-pay | Admitting: *Deleted

## 2019-10-27 NOTE — Telephone Encounter (Signed)
Sharlette Dense notified and agreed.

## 2019-10-27 NOTE — Telephone Encounter (Signed)
Of course, I recommend she get her covid vaccine.

## 2019-10-27 NOTE — Telephone Encounter (Signed)
Tonya Russell called and stated that patient has an appointment scheduled on Thursday for her Covid Vaccination.  Nephew is calling to make sure patient is ok to get the Vaccination. Please Advise.

## 2019-10-29 ENCOUNTER — Ambulatory Visit: Payer: Medicare Other | Admitting: Internal Medicine

## 2019-11-04 ENCOUNTER — Other Ambulatory Visit: Payer: Self-pay

## 2019-11-04 ENCOUNTER — Ambulatory Visit (INDEPENDENT_AMBULATORY_CARE_PROVIDER_SITE_OTHER): Payer: Medicare Other | Admitting: Family

## 2019-11-04 ENCOUNTER — Encounter: Payer: Self-pay | Admitting: Family

## 2019-11-04 VITALS — BP 120/60 | HR 77 | Temp 98.3°F | Ht 63.0 in | Wt 170.0 lb

## 2019-11-04 DIAGNOSIS — R0981 Nasal congestion: Secondary | ICD-10-CM | POA: Diagnosis not present

## 2019-11-04 DIAGNOSIS — E039 Hypothyroidism, unspecified: Secondary | ICD-10-CM | POA: Diagnosis not present

## 2019-11-04 DIAGNOSIS — E785 Hyperlipidemia, unspecified: Secondary | ICD-10-CM | POA: Diagnosis not present

## 2019-11-04 DIAGNOSIS — F22 Delusional disorders: Secondary | ICD-10-CM

## 2019-11-04 DIAGNOSIS — R10819 Abdominal tenderness, unspecified site: Secondary | ICD-10-CM

## 2019-11-04 DIAGNOSIS — F0281 Dementia in other diseases classified elsewhere with behavioral disturbance: Secondary | ICD-10-CM | POA: Diagnosis not present

## 2019-11-04 DIAGNOSIS — M810 Age-related osteoporosis without current pathological fracture: Secondary | ICD-10-CM | POA: Diagnosis not present

## 2019-11-04 DIAGNOSIS — I739 Peripheral vascular disease, unspecified: Secondary | ICD-10-CM | POA: Diagnosis not present

## 2019-11-04 DIAGNOSIS — M25561 Pain in right knee: Secondary | ICD-10-CM

## 2019-11-04 DIAGNOSIS — G301 Alzheimer's disease with late onset: Secondary | ICD-10-CM | POA: Diagnosis not present

## 2019-11-04 DIAGNOSIS — G8929 Other chronic pain: Secondary | ICD-10-CM | POA: Diagnosis not present

## 2019-11-04 LAB — POCT URINALYSIS DIPSTICK
Bilirubin, UA: NEGATIVE
Blood, UA: NEGATIVE
Glucose, UA: NEGATIVE
Ketones, UA: NEGATIVE
Leukocytes, UA: NEGATIVE
Nitrite, UA: NEGATIVE
Protein, UA: NEGATIVE
Spec Grav, UA: 1.02 (ref 1.010–1.025)
Urobilinogen, UA: 0.2 E.U./dL
pH, UA: 6 (ref 5.0–8.0)

## 2019-11-04 MED ORDER — QUETIAPINE FUMARATE 25 MG PO TABS: 12.5000 mg | ORAL_TABLET | Freq: Every day | ORAL | 0 refills | Status: DC

## 2019-11-04 NOTE — Progress Notes (Addendum)
Provider:   FNP-C   Gayland Curry, DO  Patient Care Team: Gayland Curry, DO as PCP - General (Geriatric Medicine) [name], MD as Consulting Physician (Gastroenterology) Stanford Breed Denice Bors, MD as Consulting Physician (Cardiology) Druscilla Brownie, MD as Consulting Physician (Dermatology) [name], MD as Consulting Physician (Ophthalmology) Standley Brooking, LCSW as Social Worker  Extended Emergency Contact Information Primary Emergency Contact: Causey,Edna Address: [place] [address] [name] of [place] Phone: [phone] Relation: Friend Secondary Emergency Contact: [place],  [place] of [place] Phone: [phone] Relation: Sister  Code Status: Full code  Goals of care: Advanced Directive information Advanced Directives 07/13/2019  Does Patient Have a Medical Advance Directive? Yes  Type of Paramedic of [place];Living will  Does patient want to make changes to medical advance directive? No - Patient declined  Copy of [place] in Chart? Yes - validated most recent copy scanned in chart (See row information)  Pre-existing out of facility DNR order (yellow form or pink MOST form) -     Chief Complaint  Patient presents with  . Medical Management of Chronic Issues    [facility] follow-up    HPI:  Pt is a 86y.o. female seen today for 4 months for medical management of chronic diseases.she is here with her care giver who states patient has had increased confusion.she sees visitors in the bathroom and her kitchen.she refuses to go to the bathroom and kitchen because there people.Also tells me this visit that the people usually put a body bag against her bed not sure if the get in her bed.she denies any fever,chills,burning with urination but does states has some slight pain over her lower abdomen sometimes.Care Giver states confusion has  worsen over the past one month.  Patient states her main problem is worsening right knee pain.Has trouble standing and walking due to pain on the knee.she uses her Rolator.she reports no fall episode.Take Celebrex tylenol and Gabapentin which eases off the pain but states pain wakes her up at night.Has significant Osteoarthritis on knees and back.she states saw Orthopedic specialist for her left knee several years ago.she had an cortisol injection which help.she says not a candidate for surgery.  Hypothyroidism - on Levothyroxine 50 mcg tablet daily.states stays cold all the time.Latest TSH level 1.65 (06/29/19) reviewed.   Hyperlipidemia - on Omega- 3 Fatty acids 1200 mg capsule three times daily.States does not cook anymore just warms up food on Microwave.    Nasal congestion - symptoms controlled.states uses Flonase 50 mcg nasal spray as needed.   Dementia with Behavioral issues - worsening delusion as above.Care giver request Seroquel.she lives by herself but has family visit daily assist her with her baths.she feeds self and walks with her Rolator.No fall episodes reported.No weight loss noted this visit.  Osteoporosis - on calcium-Vit D supplement daily and Alendronate 70 mg tablet weekly.Recent bone density reviewed done 10/09/2019 Left femur Neck T-score -2.5   Peripheral Vascular Disease - has numbness and tingling on feet.stays cold all the time.care giver states feet are usual red -blue in color. No ulcers reported.on Aspirin EC 81 mg tablet daily.   Past Medical History:  Diagnosis Date  . Abdominal pain, epigastric   . Allergic rhinitis due to other allergen   . Allergic rhinitis, cause unspecified   . Dermatophytosis of nail   .  Diffuse cystic mastopathy   . Diverticulosis of colon (without mention of hemorrhage)   . DVT (deep venous thrombosis) ([place])   . Dysphagia, unspecified(787.20)   . Esophageal reflux   . Family history of malignant neoplasm of gastrointestinal tract   .  Hiatal hernia   . Hyperlipidemia   . Hypertension   . Internal hemorrhoids without mention of complication   . 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 Obesity   . Oral aphthae   . Osteoarthrosis, unspecified whether generalized or localized, unspecified site   . Other abnormal blood chemistry   . Other voice and resonance disorders   . Pain in joint, pelvic region and thigh   . Palpitations   . Pancreatitis   . Peripheral vascular disease, unspecified ([place])   . Schatzki's ring   . Senile osteoporosis   . Torus fracture of fibula   . Unspecified constipation   . Unspecified hypothyroidism    Past Surgical History:  Procedure Laterality Date  . APPENDECTOMY    . BASAL CELL CARCINOMA EXCISION     right humerus, right temple, left side of nose  . BASAL CELL CARCINOMA EXCISION     chest, left cheek  . CATARACT EXTRACTION, BILATERAL    . CHOLECYSTECTOMY    . keratosis     left arm  . resection of ampullary tumor    . squamous cell cancer     Right leg  . squamous cell cancer left triceps Left    Dr. Lorenza Cambridge  . TONSILLECTOMY AND ADENOIDECTOMY      Allergies  Allergen Reactions  . Penicillins     Has patient had a PCN reaction causing immediate rash, facial/tongue/throat swelling, SOB or lightheadedness with hypotension: yes Has patient had a PCN reaction causing severe rash involving mucus membranes or skin necrosis: no Has patient had a PCN reaction that required hospitalization: no Has patient had a PCN reaction occurring within the last 10 years: no If all of the above answers are "NO", then may proceed with Cephalosporin use.  . Codeine Nausea And Vomiting  . Lodine [Etodolac] Nausea And Vomiting    Allergies as of 11/04/2019      Reactions   Penicillins    Has patient had a PCN reaction causing immediate rash, facial/tongue/throat swelling, SOB or lightheadedness with hypotension: yes Has patient had a PCN reaction causing severe rash involving mucus  membranes or skin necrosis: no Has patient had a PCN reaction that required hospitalization: no Has patient had a PCN reaction occurring within the last 10 years: no If all of the above answers are "NO", then may proceed with Cephalosporin use.   Codeine Nausea And Vomiting   Lodine [etodolac] Nausea And Vomiting      Medication List       Accurate as of November 04, 2019 10:15 AM. If you have any questions, ask your nurse or doctor.        acetaminophen 500 MG tablet Commonly known as: TYLENOL Take 2 tablets (1,000 mg total) by mouth 2 (two) times daily.   alendronate 70 MG tablet Commonly known as: FOSAMAX Take 1 tablet (70 mg total) by mouth once a week. Take with a full glass of water on an empty stomach.   aspirin EC 81 MG tablet Take 81 mg by mouth daily.   bimatoprost 0.01 % Soln Commonly known as: LUMIGAN Place 1 drop into both eyes at bedtime.   CALCIUM 600+D3 PO Take by mouth. 2  by mouth daily   celecoxib 100 MG capsule Commonly known as: CeleBREX Take 1 capsule (100 mg total) by mouth daily.   DULoxetine 60 MG capsule Commonly known as: CYMBALTA Take 1 capsule (60 mg total) by mouth daily.   FIBER CHOICE PO Take 5 g by mouth as needed.   Fish Oil 1200 MG Caps Take 1,200 mg by mouth 3 (three) times daily.   fluticasone 50 MCG/ACT nasal spray Commonly known as: FLONASE Place 1 spray into both nostrils daily as needed.   gabapentin 300 MG capsule Commonly known as: NEURONTIN Take one capsule by mouth once daily at bedtime   levothyroxine 50 MCG tablet Commonly known as: SYNTHROID TAKE 1 TABLET DAILY FOR THYROID SUPPLEMENT   multivitamin with minerals Tabs tablet Take 1 tablet by mouth daily.   nystatin powder Commonly known as: MYCOSTATIN/NYSTOP Apply topically 4 (four) times daily.   omeprazole 20 MG capsule Commonly known as: PRILOSEC TAKE 1 CAPSULE DAILY TO REDUCE STOMACH ACID.   SYSTANE ULTRA OP Place 1 drop into both eyes 3 (three)  times daily as needed (dry eyes).   Vitamin D3 50 MCG (2000 UT) capsule Take 2,000 Units by mouth daily.       Review of Systems  Constitutional: Negative for appetite change, chills, fatigue and fever.  HENT: Negative for dental problem, sinus pressure, sinus pain, sneezing, sore throat and trouble swallowing.        Chronic nasal congestion uses flonase as needed   Eyes: Positive for visual disturbance. Negative for discharge, redness and itching.       Recently seen by Ophthalmology one week ago no changes.wears eye glasses   Respiratory: Negative for cough, chest tightness, shortness of breath and wheezing.   Cardiovascular: Positive for leg swelling. Negative for chest pain and palpitations.  Gastrointestinal: Negative for abdominal distention, abdominal pain, constipation, nausea and vomiting.       Chronic diarrhea incontinent   Endocrine: Negative for cold intolerance, heat intolerance, polydipsia, polyphagia and polyuria.  Genitourinary: Negative for decreased urine volume, difficulty urinating and urgency.       Incontinent   Musculoskeletal: Positive for arthralgias, back pain and gait problem. Negative for myalgias.       Right knee pain   Skin: Negative for color change, pallor, rash and wound.  Neurological: Negative for speech difficulty, light-headedness, numbness and headaches.       Chronic dizziness   Hematological: Does not bruise/bleed easily.  Psychiatric/Behavioral: Positive for confusion. Negative for agitation, sleep disturbance and suicidal ideas. The patient is not nervous/anxious.     Immunization History  Administered Date(s) Administered  . Fluad Quad(high Dose 65+) 06/04/2019  . Influenza, High Dose Seasonal PF 05/09/2017, 06/19/2018  . Influenza,inj,Quad PF,6+ Mos 06/17/2013, 06/23/2014, 09/07/2015, 07/04/2016  . Influenza-Unspecified 07/09/2012  . PFIZER SARS-COV-2 Vaccination 11/02/2019  . Pneumococcal Conjugate-13 07/11/2018  . Pneumococcal  Polysaccharide-23 07/04/2016  . Pneumococcal-Unspecified 09/10/1992  . Tdap 09/11/2007, 07/11/2018  . Zoster 11/28/2010  . Zoster Recombinat (Shingrix) 07/09/2017, 10/23/2017   Pertinent  Health Maintenance Due  Topic Date Due  . INFLUENZA VACCINE  Completed  . DEXA SCAN  Completed  . PNA vac Low Risk Adult  Completed   Fall Risk  11/04/2019 07/13/2019 06/29/2019 02/26/2019 10/30/2018  Falls in the past year? 0 0 0 0 0  Number falls in past yr: 0 0 - 0 0  Injury with Fall? 0 0 - 0 0  Comment - - - - -  Risk Factor Category  - - - - -  Risk for fall due to : - - - - -  Follow up - - - - -    Vitals:   11/04/19 1005  BP: 120/60  Pulse: 77  Temp: 98.3 F (36.8 C)  TempSrc: Oral  SpO2: 94%  Weight: 170 lb (77.1 kg)  Height: 5' 3" (1.6 m)   Body mass index is 30.11 kg/m. Physical Exam Vitals reviewed.  Constitutional:      General: She is not in acute distress.    Appearance: She is obese. She is not ill-appearing.  HENT:     Head: Normocephalic.     Right Ear: Tympanic membrane, ear canal and external ear normal. There is no impacted cerumen.     Left Ear: Tympanic membrane, ear canal and external ear normal. There is no impacted cerumen.     Nose: Nose normal. No congestion or rhinorrhea.     Mouth/Throat:     Mouth: Mucous membranes are moist.     Pharynx: Oropharynx is clear. No oropharyngeal exudate or posterior oropharyngeal erythema.  Eyes:     General: No scleral icterus.       Right eye: No discharge.        Left eye: No discharge.     Extraocular Movements: Extraocular movements intact.     Conjunctiva/sclera: Conjunctivae normal.     Pupils: Pupils are equal, round, and reactive to light.     Comments: Corrective lens in place   Neck:     Vascular: No carotid bruit.  Cardiovascular:     Rate and Rhythm: Normal rate and regular rhythm.     Pulses: Normal pulses.     Heart sounds: Normal heart sounds. No murmur. No friction rub. No gallop.   Pulmonary:      Effort: Pulmonary effort is normal. No respiratory distress.     Breath sounds: Normal breath sounds. No wheezing, rhonchi or rales.  Chest:     Chest wall: No tenderness.  Abdominal:     General: Bowel sounds are normal. There is no distension.     Palpations: Abdomen is soft. There is no mass.     Tenderness: There is no abdominal tenderness. There is no right CVA tenderness, left CVA tenderness, guarding or rebound.  Musculoskeletal:     Cervical back: Normal range of motion. No rigidity or tenderness.     Right knee: Deformity and crepitus present. No swelling, erythema or ecchymosis. Tenderness present over the medial joint line.     Left knee: No swelling, deformity, effusion or erythema. No tenderness.     Right lower leg: No edema.     Left lower leg: No edema.     Comments: Ambulates with a Rolator.  Lymphadenopathy:     Cervical: No cervical adenopathy.  Skin:    General: Skin is warm and dry.     Coloration: Skin is not pale.     Findings: No bruising, erythema or rash.     Comments: Right upper back flesh like lesion 6 mm non-tender to touch,no redness or drainage noted.    Neurological:     Mental Status: She is alert.     Sensory: No sensory deficit.     Motor: No weakness.     Coordination: Coordination normal.     Gait: Gait abnormal.  Psychiatric:        Mood and Affect: Mood normal.        Speech: Speech normal.        Behavior: Behavior  is cooperative.        Thought Content: Thought content is delusional.        Cognition and Memory: Memory is impaired.        Judgment: Judgment normal.    Labs reviewed: Recent Labs    06/29/19 1200  NA 143  K 4.3  CL 106  CO2 29  GLUCOSE 97  BUN 16  CREATININE 0.64  CALCIUM 9.3    Recent Labs    06/29/19 1200  WBC 5.7  NEUTROABS 3,164  HGB 11.9  HCT 36.1  MCV 98.6  PLT 167   Lab Results  Component Value Date   TSH 1.65 06/29/2019   Lab Results  Component Value Date   HGBA1C 5.3 07/01/2017    Lab Results  Component Value Date   CHOL 265 (H) 07/01/2017   HDL 93 07/01/2017   LDLCALC 154 (H) 07/01/2017   LDLDIRECT 113.2 10/18/2006   TRIG 75 07/01/2017   CHOLHDL 2.8 07/01/2017    Significant Diagnostic Results in last 30 days:  DG Bone Density  Result Date: 10/09/2019 EXAM: DUAL X-RAY ABSORPTIOMETRY (DXA) FOR BONE MINERAL DENSITY IMPRESSION: Referring Physician:  Sherrie Mustache Your patient completed a BMD test using Lunar IDXA DXA system ( analysis version: 16 ) manufactured by [employer]. Technologist: WLS PATIENT: Name: Sibel, Khurana Patient ID: [id number] Birth Date: 03-09-1925 Height: 63.0 in. Sex: Female Measured: 10/09/2019 Weight: 162.2 lbs. Indications: Advanced Age, Caucasian, cymbalta, Estrogen Deficient, Gabapentin, Height Loss (781.91), History of Fracture (Adult) (V15.51), History of Osteoporosis, Hypothyroid, Levothyroxine, Postmenopausal Fractures: Ankle Treatments: Calcium (E943.0), Vitamin D (E933.5) Fosamax ASSESSMENT: The BMD measured at Femur Neck Left is 0.688 g/cm2 with a T-score of -2.5. This patient is considered osteoporotic according to [facility]) criteria. There has been a statistically significant decrease in BMD of Total Mean hips since prior exam dated 05/21/2017. The scan quality is limited by patient limited mobility. L-3, L-4 were excluded due to degenerative changes. Site Region Measured Date Measured Age YA BMD Significant CHANGE T-score DualFemur Neck Left  10[age]09/2019    92.9         -2.5    0.688 g/cm2 DualFemur Neck Left  05/21/2017    90.6         -2.5    0.696 g/cm2 AP Spine  L1-L2      10[age]09/2019    92.9         -1.6    0.978 g/cm2 AP Spine  L1-L2      05/21/2017    90.6         -1.5    0.998 g/cm2 DualFemur Total Mean 10/09/2019 92.9 -2.0 0.754 g/cm2 * DualFemur Total Mean 05/21/2017    90.6         -1.8    0.783 g/cm2 World Health Organization [facility]) criteria for post-menopausal, Caucasian Women: Normal       T-score at or  above -1 SD Osteopenia   T-score between -1 and -2.5 SD Osteoporosis T-score at or below -2.5 SD RECOMMENDATION: 1. All patients should optimize calcium and vitamin D intake. 2. Consider FDA approved medical therapies in postmenopausal women and men aged 51 years and older, based on the following: a. A hip or vertebral (clinical or morphometric) fracture b. T- score < or = -2.5 at the femoral neck or spine after appropriate evaluation to exclude secondary causes c. Low bone mass (T-score between -1.0 and -2.5 at the femoral neck or spine) and a 10 year  probability of a hip fracture > or = 3% or a 10 year probability of a major osteoporosis-related fracture > or = 20% based on the [place]-adapted WHO algorithm d. Clinician judgment and/or patient preferences may indicate treatment for people with 10-year fracture probabilities above or below these levels FOLLOW-UP: People with diagnosed cases of osteoporosis or at high risk for fracture should have regular bone mineral density tests. For patients eligible for Medicare, routine testing is allowed once every 2 years. The testing frequency can be increased to one year for patients who have rapidly progressing disease, those who are receiving or discontinuing medical therapy to restore bone mass, or have additional risk factors. I have reviewed this report and agree with the above findings. [facility] Electronically Signed   By: Lowella Grip III M.D.   On: 10/09/2019 12:16    Assessment/Plan 1. Chronic pain of right knee Pain has worsen affecting her walking and standing.Exam findings as above. - continue on Tylenol as needed and Gabapentin. - Ambulatory referral to Orthopedic Surgery for further evaluation - CBC with Differential/Platelet; Future - CMP with eGFR([facility]); Future  2. Suprapubic tenderness Afebrile.Slight tenderness to palpation.will obtain urine to rule out Urinary tract infection. - Encourage to increase her water intake to 6-8  glasses daily.  - POC Urinalysis Dipstick negative for UTI  3. Hypothyroidism, unspecified type Lab Results  Component Value Date   TSH 1.65 06/29/2019  Continue on Levothyroxine 50 mcg tablet daily on empty stomach. - TSH; Future  4. Delusions ([place]) Afebrile.seeing people in her bathroom and kitchen area.Also tells me this visit that the people usually put a body bag against her bed not sure if the get in her bed. - Urine specimen negative for urinary tract infection.suspect symptoms due to her dementia.will rule out other acute infectious or metabolic etiologies. - CMP with eGFR([facility]); Future - QUEtiapine (SEROQUEL) 25 MG tablet; Take 0.5 tablets (12.5 mg total) by mouth at bedtime.  Dispense: 30 tablet; Refill: 0  5. Late onset Alzheimer's disease with behavioral disturbance ([place]) Worsening confusion and delusions.Lives by herself and care giver assist with ADL's.will start on low dose Seroquel then follow up in 4 weeks to re-evaluate.   - CMP with eGFR([facility]); Future - QUEtiapine (SEROQUEL) 25 MG tablet; Take 0.5 tablets (12.5 mg total) by mouth at bedtime.  Dispense: 30 tablet; Refill: 0  6. Hyperlipidemia, unspecified hyperlipidemia type Continue on Omega- 3 Fatty acids 1200 mg capsule three times daily.  7. Peripheral vascular disease, unspecified ([place]) No ulceration.continue on Aspirin EC 81 mg tablet daily.   8. Nasal congestion Asymptomatic this visit.continue on Flonase as needed.   9. Senile Osteoporosis   Left femur Neck T-score -2.5 ( 10/09/2019) reviewed. - continue on calcium-Vit D supplement daily and Alendronate 70 mg tablet weekly  Family/ staff Communication: Reviewed plan of care with patient and care giver verbalized understanding.   Labs/tests ordered:-  - CBC with Differential/Platelet; Future - CMP with eGFR([facility]); Future - TSH; Future  Next Appointment :4 weeks for evaluation of delusions /confusion/Dementia.   Sandrea Hughs, NP

## 2019-11-04 NOTE — Patient Instructions (Signed)
Take Seroquel 12.5 mg tablet one by mouth daily at bedtime for delusions.Notify provider if symptoms worsen or fail to resolve.

## 2019-11-06 ENCOUNTER — Telehealth: Payer: Self-pay | Admitting: *Deleted

## 2019-11-06 NOTE — Telephone Encounter (Signed)
-----   Message from [place] sent at 11/06/2019 12:04 PM EST ----- Regarding: Patient Labs Good Afternoon!  I hope you all are doing well!  I miss seeing you guys!  I just got a voicemail message from Ms. Agar's nephew, [name].  I believe that he must have my number saved for [facility].  He said that she's supposed to come in for labs on Tuesday, but they just found out that her sister was exposed to someone who tested positive for COVID.  He didn't say if Ms. Bonne was around her sister or not, but he wanted someone to call him and let him know what they should do.  He left his phone number, which is [phone].  Thanks, Dee-Dee

## 2019-11-06 NOTE — Telephone Encounter (Signed)
Spoke with patient nephew and he stated that his aunt tested negative so they will hold off on testing the patient.

## 2019-11-10 ENCOUNTER — Other Ambulatory Visit: Payer: Medicare Other

## 2019-11-10 ENCOUNTER — Encounter: Payer: Self-pay | Admitting: Family Medicine

## 2019-11-10 ENCOUNTER — Other Ambulatory Visit: Payer: Self-pay

## 2019-11-10 ENCOUNTER — Ambulatory Visit (INDEPENDENT_AMBULATORY_CARE_PROVIDER_SITE_OTHER): Payer: Medicare Other | Admitting: Family Medicine

## 2019-11-10 DIAGNOSIS — M25561 Pain in right knee: Secondary | ICD-10-CM | POA: Diagnosis not present

## 2019-11-10 DIAGNOSIS — M1711 Unilateral primary osteoarthritis, right knee: Secondary | ICD-10-CM | POA: Diagnosis not present

## 2019-11-10 DIAGNOSIS — G8929 Other chronic pain: Secondary | ICD-10-CM | POA: Diagnosis not present

## 2019-11-10 DIAGNOSIS — G301 Alzheimer's disease with late onset: Secondary | ICD-10-CM | POA: Diagnosis not present

## 2019-11-10 DIAGNOSIS — F02818 Dementia in other diseases classified elsewhere, unspecified severity, with other behavioral disturbance: Secondary | ICD-10-CM

## 2019-11-10 DIAGNOSIS — F0281 Dementia in other diseases classified elsewhere with behavioral disturbance: Secondary | ICD-10-CM | POA: Diagnosis not present

## 2019-11-10 DIAGNOSIS — E039 Hypothyroidism, unspecified: Secondary | ICD-10-CM | POA: Diagnosis not present

## 2019-11-10 DIAGNOSIS — F22 Delusional disorders: Secondary | ICD-10-CM | POA: Diagnosis not present

## 2019-11-10 DIAGNOSIS — L84 Corns and callosities: Secondary | ICD-10-CM

## 2019-11-10 NOTE — Progress Notes (Signed)
Tonya Russell - 86 y.o. female MRN [id number]  Date of birth: [birth year]  Office Visit Note: Visit Date: 11/10/2019 PCP: Gayland Curry, DO Referred by: Sandrea Hughs, NP  Subjective: Chief Complaint  Patient presents with  . Right Knee - Pain  . Left Knee - Pain   HPI: Tonya Russell is a 86 y.o. female who comes in today with chronic bilateral knee pain, right worse than left.   She has known OA in both knees with spurring and severe DJD. Pain in right knee is constant, worse with standing. Painful behind knee cap and posterior knee. Painful at night. She takes gapapentin with some relief. She had injection in left knee several years ago which helped with pain.    ROS Otherwise per HPI.  Assessment & Plan: Visit Diagnoses:  1. Unilateral primary osteoarthritis, right knee   2. Pre-ulcerative corn or callous     Plan: Patient elected for corticosteroid injection of right knee today. She felt immediate relief after injection. Can return every 3-4 months for repeat injections as needed as we want to keep her functional at her age.  Meds & Orders: No orders of the defined types were placed in this encounter.  No orders of the defined types were placed in this encounter.   Follow-up: PRN  Procedures: Procedure performed: right knee intraarticular corticosteroid injection  Noted no overlying erythema, induration, or other signs of local infection. The superior lateral patella was palpated and marked. The overlying skin was prepped in a sterile fashion. Topical analgesic spray: Ethyl chloride. Joint: right knee Needle: 25 gauge, 1.5 inch  Hemarthrosis visualized with aspiration of knee Meds: 40 mg methylrprednisolone, 4 ml 1% lidocaine without epinephrine  Completed without difficulty.     Clinical History: No specialty comments available.   She reports that she has never smoked. She has never used smokeless tobacco. No results for input(s): HGBA1C, LABURIC in the  last 8760 hours.  Objective:  VS:  HT:    WT:   BMI:     BP:   HR: bpm  TEMP: ( )  RESP:  Physical Exam  PHYSICAL EXAM: Gen: elderly woman, alert, cooperative with exam, well-appearing HEENT: clear conjunctiva,  CV:  no edema, capillary refill brisk, normal rate Resp: non-labored Skin: no rashes, normal turgor  Neuro: no gross deficits.  Psych:  alert and oriented  Ortho Exam  Knee: - Inspection: lower leg with dark, scaly skin and soft tissue swelling consistent with venous stasis Knee with 1+ effusion - Palpation: TTP peripatellar and posterior knee, 2+ crepitus with motion - ROM: full active ROM with flexion and extension in knee and hip - Strength: 5/5 strength - Neuro/vasc: NV intact - Special Tests: - LIGAMENTS: negative anterior and posterior drawer,no MCL or LCL laxity  -- MENISCUS: negative McMurray's, negative Thessaly   Imaging: No results found.  Past Medical/Family/Surgical/Social History: Medications & Allergies reviewed per EMR, new medications updated. Patient Active Problem List   Diagnosis Date Noted  . Senile osteoporosis   . 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 Primary osteoarthritis of left knee 10/30/2018  . MCI (mild cognitive impairment) with memory loss 10/30/2018  . Eustachian tube dysfunction 04/28/2018  . Incontinence of urine in female 07/13/2017  . Pain in left knee 01/02/2017  . Dizzy 10/12/2015  . Corn of foot 12/16/2013  . Hypothyroidism 01/26/2013  . Hyperglycemia   . Hyperlipidemia   . Hypertension   . 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  Peripheral vascular disease, unspecified ([place])   . Mildly obese 11/01/2008  . ESOPHAGEAL MOTILITY DISORDER 11/01/2008  . Dysphagia 11/01/2008  . Non-toxic nodular goiter 10/13/2008  . ANXIETY 10/13/2008  . Coronary atherosclerosis 10/13/2008  . Internal hemorrhoids 10/13/2008  . DIVERTICULOSIS, SIGMOID COLON 10/13/2008  . Chronic pancreatitis ([place]) 10/13/2008    . GERD 09/25/2005   Past Medical History:  Diagnosis Date  . Abdominal pain, epigastric   . Allergic rhinitis due to other allergen   . Allergic rhinitis, cause unspecified   . Dermatophytosis of nail   . Diffuse cystic mastopathy   . Diverticulosis of colon (without mention of hemorrhage)   . DVT (deep venous thrombosis) ([place])   . Dysphagia, unspecified(787.20)   . Esophageal reflux   . Family history of malignant neoplasm of gastrointestinal tract   . Hiatal hernia   . Hyperlipidemia   . Hypertension   . Internal hemorrhoids without mention of complication   . 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 Obesity   . Oral aphthae   . Osteoarthrosis, unspecified whether generalized or localized, unspecified site   . Other abnormal blood chemistry   . Other voice and resonance disorders   . Pain in joint, pelvic region and thigh   . Palpitations   . Pancreatitis   . Peripheral vascular disease, unspecified ([place])   . Schatzki's ring   . Senile osteoporosis   . Torus fracture of fibula   . Unspecified constipation   . Unspecified hypothyroidism    Family History  Problem Relation Age of Onset  . Cancer Mother        spindle cell carcinoma on neck  . Colon cancer Father 64  . Breast cancer Sister   . Colon polyps Sister   . Heart disease Brother   . Breast cancer Other        neiece x 3  . Diabetes Niece   . Esophageal cancer Neg Hx   . Pancreatic cancer Neg Hx   . Kidney disease Neg Hx   . Liver disease Neg Hx    Past Surgical History:  Procedure Laterality Date  . APPENDECTOMY    . BASAL CELL CARCINOMA EXCISION     right humerus, right temple, left side of nose  . BASAL CELL CARCINOMA EXCISION     chest, left cheek  . CATARACT EXTRACTION, BILATERAL    . CHOLECYSTECTOMY    . keratosis     left arm  . resection of ampullary tumor    . squamous cell cancer     Right leg  . squamous cell cancer left triceps Left    Dr. Lorenza Cambridge  . TONSILLECTOMY AND  ADENOIDECTOMY     Social History   Occupational History  . Occupation: Retired  Tobacco Use  . Smoking status: Never Smoker  . Smokeless tobacco: Never Used  Substance and Sexual Activity  . Alcohol use: No  . Drug use: No  . Sexual activity: Never

## 2019-11-10 NOTE — Progress Notes (Signed)
I saw and examined the patient with Dr. Mayer Masker and agree with assessment and plan as outlined.    Right knee pain most likely due to OA.  Injected today.  Darker blood aspirated prior to injection.  Will obtain x-rays if only short-term relief.  Also hypertrophic callous on end of left great toe.  Pared with 15 blade.

## 2019-11-11 LAB — TSH: TSH: 1.89 mIU/L (ref 0.40–4.50)

## 2019-11-11 LAB — COMPLETE METABOLIC PANEL WITH GFR
AG Ratio: 1.5 (calc) (ref 1.0–2.5)
ALT: 22 U/L (ref 6–29)
AST: 23 U/L (ref 10–35)
Albumin: 4.1 g/dL (ref 3.6–5.1)
Alkaline phosphatase (APISO): 66 U/L (ref 37–153)
BUN: 18 mg/dL (ref 7–25)
CO2: 27 mmol/L (ref 20–32)
Calcium: 9.3 mg/dL (ref 8.6–10.4)
Chloride: 105 mmol/L (ref 98–110)
Creat: 0.8 mg/dL (ref 0.60–0.88)
GFR, Est African American: 74 mL/min/{1.73_m2} (ref >=60)
GFR, Est Non African American: 64 mL/min/{1.73_m2} (ref >=60)
Globulin: 2.8 g/dL (calc) (ref 1.9–3.7)
Glucose, Bld: 98 mg/dL (ref 65–99)
Potassium: 4.2 mmol/L (ref 3.5–5.3)
Sodium: 143 mmol/L (ref 135–146)
Total Bilirubin: 0.6 mg/dL (ref 0.2–1.2)
Total Protein: 6.9 g/dL (ref 6.1–8.1)

## 2019-11-11 LAB — CBC WITH DIFFERENTIAL/PLATELET
Absolute Monocytes: 434 cells/uL (ref 200–950)
Basophils Absolute: 28 cells/uL (ref 0–200)
Basophils Relative: 0.4 %
Eosinophils Absolute: 91 cells/uL (ref 15–500)
Eosinophils Relative: 1.3 %
HCT: 38.3 % (ref 35.0–45.0)
Hemoglobin: 12.6 g/dL (ref 11.7–15.5)
Lymphs Abs: 1673 cells/uL (ref 850–3900)
MCH: 32.2 pg (ref 27.0–33.0)
MCHC: 32.9 g/dL (ref 32.0–36.0)
MCV: 98 fL (ref 80.0–100.0)
MPV: 10.1 fL (ref 7.5–12.5)
Monocytes Relative: 6.2 %
Neutro Abs: 4774 cells/uL (ref 1500–7800)
Neutrophils Relative %: 68.2 %
Platelets: 207 10*3/uL (ref 140–400)
RBC: 3.91 10*6/uL (ref 3.80–5.10)
RDW: 11.2 % (ref 11.0–15.0)
Total Lymphocyte: 23.9 %
WBC: 7 10*3/uL (ref 3.8–10.8)

## 2019-11-26 ENCOUNTER — Other Ambulatory Visit: Payer: Self-pay | Admitting: Family

## 2019-11-26 DIAGNOSIS — F02818 Dementia in other diseases classified elsewhere, unspecified severity, with other behavioral disturbance: Secondary | ICD-10-CM

## 2019-11-26 DIAGNOSIS — F22 Delusional disorders: Secondary | ICD-10-CM

## 2019-11-26 DIAGNOSIS — F0281 Dementia in other diseases classified elsewhere with behavioral disturbance: Secondary | ICD-10-CM

## 2019-11-30 ENCOUNTER — Encounter: Payer: Self-pay | Admitting: Family

## 2019-11-30 ENCOUNTER — Other Ambulatory Visit: Payer: Self-pay

## 2019-11-30 ENCOUNTER — Ambulatory Visit (INDEPENDENT_AMBULATORY_CARE_PROVIDER_SITE_OTHER): Payer: Medicare Other | Admitting: Family

## 2019-11-30 VITALS — BP 138/70 | HR 74 | Temp 98.0°F | Resp 18 | Ht 63.0 in | Wt 166.0 lb

## 2019-11-30 DIAGNOSIS — F22 Delusional disorders: Secondary | ICD-10-CM | POA: Diagnosis not present

## 2019-11-30 DIAGNOSIS — F0281 Dementia in other diseases classified elsewhere with behavioral disturbance: Secondary | ICD-10-CM

## 2019-11-30 DIAGNOSIS — F02818 Dementia in other diseases classified elsewhere, unspecified severity, with other behavioral disturbance: Secondary | ICD-10-CM

## 2019-11-30 DIAGNOSIS — G301 Alzheimer's disease with late onset: Secondary | ICD-10-CM | POA: Diagnosis not present

## 2019-11-30 MED ORDER — QUETIAPINE FUMARATE 25 MG PO TABS: 25.0000 mg | ORAL_TABLET | Freq: Every day | ORAL | 1 refills | Status: DC

## 2019-11-30 MED ORDER — ALENDRONATE SODIUM 70 MG PO TABS: 70.0000 mg | ORAL_TABLET | ORAL | 3 refills | Status: DC

## 2019-11-30 NOTE — Progress Notes (Signed)
Provider: Melroy Bougher FNP-C  Gayland Curry, DO  Patient Care Team: Gayland Curry, DO as PCP - General (Geriatric Medicine) [name], MD as Consulting Physician (Gastroenterology) Stanford Breed Denice Bors, MD as Consulting Physician (Cardiology) Druscilla Brownie, MD as Consulting Physician (Dermatology) [name], MD as Consulting Physician (Ophthalmology) Standley Brooking, LCSW as Social Worker  Extended Emergency Contact Information Primary Emergency Contact: Causey,Edna Address: [address] [address] [name] of [place] Phone: [phone] Relation: Friend Secondary Emergency Contact: [place], [place] of [place] Phone: [phone] Relation: Sister  Code Status:  DNR Goals of care: Advanced Directive information Advanced Directives 07/13/2019  Does Patient Have a Medical Advance Directive? Yes  Type of Paramedic of [place];Living will  Does patient want to make changes to medical advance directive? No - Patient declined  Copy of [place] in Chart? Yes - validated most recent copy scanned in chart (See row information)  Pre-existing out of facility DNR order (yellow form or pink MOST form) -     Chief Complaint  Patient presents with  . Acute Visit    Hallucination     HPI:  Pt is a 86 y.o. female seen today for an acute visit for one month follow up hallucination.she was here 11/02/2019 with complains of visionary hallucination seeing people at night in her bathroom and Kitchen refusing to go to her Kitchen during the day.she was started on Seroquel 12.5 mg tablet at bedtime.Care giver states medication seems to be working some states better with a whole pill than half a pill.Request medication to be increased.No fall episodes reported. Also complains of right knee pain states was seen by Orthopedic and injection was give but still has pain on  the knee.Takes tylenol and applies analgesic cream. States nothing else can be done.Has follow up appointment with Orthopedic in June,2021.     Past Medical History:  Diagnosis Date  . Abdominal pain, epigastric   . Allergic rhinitis due to other allergen   . Allergic rhinitis, cause unspecified   . Dermatophytosis of nail   . Diffuse cystic mastopathy   . Diverticulosis of colon (without mention of hemorrhage)   . DVT (deep venous thrombosis) ([place])   . Dysphagia, unspecified(787.20)   . Esophageal reflux   . Family history of malignant neoplasm of gastrointestinal tract   . Hiatal hernia   . Hyperlipidemia   . Hypertension   . Internal hemorrhoids without mention of complication   . 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 Obesity   . Oral aphthae   . Osteoarthrosis, unspecified whether generalized or localized, unspecified site   . Other abnormal blood chemistry   . Other voice and resonance disorders   . Pain in joint, pelvic region and thigh   . Palpitations   . Pancreatitis   . Peripheral vascular disease, unspecified ([place])   . Schatzki's ring   . Senile osteoporosis   . Torus fracture of fibula   . Unspecified constipation   . Unspecified hypothyroidism    Past Surgical History:  Procedure Laterality Date  . APPENDECTOMY    . BASAL CELL CARCINOMA EXCISION     right humerus, right temple, left side of nose  . BASAL CELL CARCINOMA EXCISION     chest, left cheek  . CATARACT EXTRACTION, BILATERAL    .  CHOLECYSTECTOMY    . keratosis     left arm  . resection of ampullary tumor    . squamous cell cancer     Right leg  . squamous cell cancer left triceps Left    Dr. Lorenza Cambridge  . TONSILLECTOMY AND ADENOIDECTOMY      Allergies  Allergen Reactions  . Penicillins     Has patient had a PCN reaction causing immediate rash, facial/tongue/throat swelling, SOB or lightheadedness with hypotension: yes Has patient had a PCN reaction causing severe rash involving  mucus membranes or skin necrosis: no Has patient had a PCN reaction that required hospitalization: no Has patient had a PCN reaction occurring within the last 10 years: no If all of the above answers are "NO", then may proceed with Cephalosporin use.  . Codeine Nausea And Vomiting  . Lodine [Etodolac] Nausea And Vomiting    Outpatient Encounter Medications as of 11/30/2019  Medication Sig  . acetaminophen (TYLENOL) 500 MG tablet Take 2 tablets (1,000 mg total) by mouth 2 (two) times daily.  Marland Kitchen alendronate (FOSAMAX) 70 MG tablet Take 1 tablet (70 mg total) by mouth once a week. Take with a full glass of water on an empty stomach.  Marland Kitchen aspirin EC 81 MG tablet Take 81 mg by mouth daily.  . bimatoprost (LUMIGAN) 0.01 % SOLN Place 1 drop into both eyes at bedtime.  . Calcium Carb-Cholecalciferol (CALCIUM 600+D3 PO) Take by mouth. 2 by mouth daily  . celecoxib (CELEBREX) 100 MG capsule Take 1 capsule (100 mg total) by mouth daily.  . Cholecalciferol (VITAMIN D3) 50 MCG (2000 UT) capsule Take 2,000 Units by mouth daily.  . DULoxetine (CYMBALTA) 60 MG capsule Take 1 capsule (60 mg total) by mouth daily.  . fluticasone (FLONASE) 50 MCG/ACT nasal spray Place 1 spray into both nostrils daily as needed.   . gabapentin (NEURONTIN) 300 MG capsule Take one capsule by mouth once daily at bedtime  . Inulin (FIBER CHOICE PO) Take 5 g by mouth as needed.   Marland Kitchen levothyroxine (SYNTHROID) 50 MCG tablet TAKE 1 TABLET DAILY FOR THYROID SUPPLEMENT  . Multiple Vitamin (MULTIVITAMIN WITH MINERALS) TABS tablet Take 1 tablet by mouth daily.  Marland Kitchen nystatin (MYCOSTATIN/NYSTOP) powder Apply topically 4 (four) times daily.  . Omega-3 Fatty Acids (FISH OIL) 1200 MG CAPS Take 1,200 mg by mouth 3 (three) times daily.   Marland Kitchen omeprazole (PRILOSEC) 20 MG capsule TAKE 1 CAPSULE DAILY TO REDUCE STOMACH ACID.  Marland Kitchen Polyethyl Glycol-Propyl Glycol (SYSTANE ULTRA OP) Place 1 drop into both eyes 3 (three) times daily as needed (dry eyes).   .  QUEtiapine (SEROQUEL) 25 MG tablet Take 1 tablet (25 mg total) by mouth at bedtime.  . [DISCONTINUED] QUEtiapine (SEROQUEL) 25 MG tablet TAKE 0.5 TABLETS (12.5 MG TOTAL) BY MOUTH AT BEDTIME.  . [DISCONTINUED] alendronate (FOSAMAX) 70 MG tablet Take 1 tablet (70 mg total) by mouth once a week. Take with a full glass of water on an empty stomach.   No facility-administered encounter medications on file as of 11/30/2019.    Review of Systems  Constitutional: Negative for appetite change, chills, fatigue and fever.  HENT: Negative for congestion, postnasal drip, rhinorrhea, sinus pressure, sinus pain, sneezing and sore throat.   Respiratory: Negative for cough, chest tightness, shortness of breath and wheezing.   Cardiovascular: Negative for chest pain, palpitations and leg swelling.  Gastrointestinal: Negative for abdominal distention, abdominal pain, constipation, diarrhea, nausea and vomiting.  Genitourinary: Negative for difficulty urinating, dysuria, flank  pain, frequency and urgency.  Musculoskeletal: Positive for arthralgias and gait problem.  Skin: Negative for color change, pallor and rash.  Neurological: Negative for dizziness, speech difficulty, light-headedness and headaches.  Hematological: Does not bruise/bleed easily.  Psychiatric/Behavioral: Positive for confusion. Negative for agitation and sleep disturbance. The patient is not nervous/anxious.     Immunization History  Administered Date(s) Administered  . Fluad Quad(high Dose 65+) 06/04/2019  . Influenza, High Dose Seasonal PF 05/09/2017, 06/19/2018  . Influenza,inj,Quad PF,6+ Mos 06/17/2013, 06/23/2014, 09/07/2015, 07/04/2016  . Influenza-Unspecified 07/09/2012  . PFIZER SARS-COV-2 Vaccination 11/02/2019  . Pneumococcal Conjugate-13 07/11/2018  . Pneumococcal Polysaccharide-23 07/04/2016  . Pneumococcal-Unspecified 09/10/1992  . Tdap 09/11/2007, 07/11/2018  . Zoster 11/28/2010  . Zoster Recombinat (Shingrix) 07/09/2017,  10/23/2017   Pertinent  Health Maintenance Due  Topic Date Due  . INFLUENZA VACCINE  Completed  . DEXA SCAN  Completed  . PNA vac Low Risk Adult  Completed   Fall Risk  11/04/2019 07/13/2019 06/29/2019 02/26/2019 10/30/2018  Falls in the past year? 0 0 0 0 0  Number falls in past yr: 0 0 - 0 0  Injury with Fall? 0 0 - 0 0  Comment - - - - -  Risk Factor Category  - - - - -  Risk for fall due to : - - - - -  Follow up - - - - -    Vitals:   11/30/19 1428  BP: 138/70  Pulse: 74  Resp: 18  Temp: 98 F (36.7 C)  TempSrc: Tympanic  SpO2: 97%  Weight: 166 lb (75.3 kg)  Height: 5\' 3"  (1.6 m)   Body mass index is 29.41 kg/m. Physical Exam Constitutional:      General: She is not in acute distress.    Appearance: She is overweight. She is not ill-appearing.  Eyes:     General: No scleral icterus.       Right eye: No discharge.        Left eye: No discharge.     Extraocular Movements: Extraocular movements intact.     Conjunctiva/sclera: Conjunctivae normal.     Pupils: Pupils are equal, round, and reactive to light.     Comments: Corrective lens in place   Cardiovascular:     Rate and Rhythm: Normal rate and regular rhythm.     Pulses: Normal pulses.     Heart sounds: Normal heart sounds. No murmur. No friction rub. No gallop.   Pulmonary:     Effort: Pulmonary effort is normal. No respiratory distress.     Breath sounds: Normal breath sounds. No wheezing, rhonchi or rales.  Chest:     Chest wall: No tenderness.  Abdominal:     General: Bowel sounds are normal. There is no distension.     Palpations: Abdomen is soft. There is no mass.     Tenderness: There is no abdominal tenderness. There is no right CVA tenderness, left CVA tenderness, guarding or rebound.  Musculoskeletal:        General: No swelling.     Right lower leg: No edema.     Left lower leg: No edema.     Comments: Unsteady gait ambulates on Rolator.  Skin:    General: Skin is warm and dry.      Coloration: Skin is not pale.     Findings: No bruising or erythema.  Neurological:     Mental Status: She is alert. Mental status is at baseline.     Cranial  Nerves: No cranial nerve deficit.     Motor: No weakness.     Gait: Gait abnormal.  Psychiatric:        Mood and Affect: Mood normal.        Speech: Speech normal.        Behavior: Behavior is cooperative.        Thought Content: Thought content is delusional.        Cognition and Memory: Memory is impaired.    Labs reviewed: Recent Labs    06/29/19 1200 11/10/19 1205  NA 143 143  K 4.3 4.2  CL 106 105  CO2 29 27  GLUCOSE 97 98  BUN 16 18  CREATININE 0.64 0.80  CALCIUM 9.3 9.3   Recent Labs    11/10/19 1205  AST 23  ALT 22  BILITOT 0.6  PROT 6.9   Recent Labs    06/29/19 1200 11/10/19 1205  WBC 5.7 7.0  NEUTROABS 3,164 4,774  HGB 11.9 12.6  HCT 36.1 38.3  MCV 98.6 98.0  PLT 167 207   Lab Results  Component Value Date   TSH 1.89 11/10/2019   Lab Results  Component Value Date   HGBA1C 5.3 07/01/2017   Lab Results  Component Value Date   CHOL 265 (H) 07/01/2017   HDL 93 07/01/2017   LDLCALC 154 (H) 07/01/2017   LDLDIRECT 113.2 10/18/2006   TRIG 75 07/01/2017   CHOLHDL 2.8 07/01/2017    Significant Diagnostic Results in last 30 days:  No results found.  Assessment/Plan 1. Late onset Alzheimer's disease with behavioral disturbance ([place]) Continue to reported seeing people in her bathroom and Kitchen Seroquel has been some effective.Care giver request increase to 25 mg tablet instead of 12.5 mg tablet.Seroquel changed to 25 mg tablet at bedtime.side effects discussed.Fall and safety precaution.  - continue supportive care. - QUEtiapine (SEROQUEL) 25 MG tablet; Take 1 tablet (25 mg total) by mouth at bedtime.  Dispense: 45 tablet; Refill: 1  2. Delusions ([place]) Seeing people in her bathroom and Kitchen.will adjust Seroquel as above.Recent Urine specimen was negative for UTI's.CBC and CMP were  normal.continue to monitor. - QUEtiapine (SEROQUEL) 25 MG tablet; Take 1 tablet (25 mg total) by mouth at bedtime.  Dispense: 45 tablet; Refill: 1 Family/ staff Communication: Reviewed plan of care with patient  Labs/tests ordered: None   Next Appointment: 3 months with Dr.Reed for medical management of chronic issues.  Sandrea Hughs, NP

## 2019-11-30 NOTE — Patient Instructions (Signed)
Increase Seroquel from 12.5 mg tablet to 25 mg tablet one by mouth daily at bedtime.

## 2019-12-17 ENCOUNTER — Telehealth: Payer: Self-pay

## 2019-12-17 NOTE — Telephone Encounter (Signed)
Patient called the [facility] office on 12/16/19 and asked if I could tell her if her "$1400 had been deposited" in her account. I told her that she had called her doctor's office and she said she "knew that."  She then asked who she needed to talk to. II told her she should call her bank.  I called the nephew, [name], this morning and let him know that she had called.  He stated he talked to her last night and she told him that she had called [facility] instead of her bank. He stated he was going to call the bank this morning and thanked me for calling him.

## 2019-12-23 ENCOUNTER — Other Ambulatory Visit: Payer: Self-pay | Admitting: *Deleted

## 2019-12-23 MED ORDER — GABAPENTIN 300 MG PO CAPS: ORAL_CAPSULE | ORAL | 1 refills | Status: DC

## 2019-12-23 NOTE — Telephone Encounter (Signed)
Patient nephew requested refill. Faxed to pharmacy.

## 2019-12-24 ENCOUNTER — Encounter: Payer: Self-pay | Admitting: Family

## 2020-01-19 ENCOUNTER — Other Ambulatory Visit: Payer: Self-pay

## 2020-01-19 DIAGNOSIS — G8929 Other chronic pain: Secondary | ICD-10-CM

## 2020-01-19 DIAGNOSIS — M1712 Unilateral primary osteoarthritis, left knee: Secondary | ICD-10-CM

## 2020-01-19 DIAGNOSIS — M542 Cervicalgia: Secondary | ICD-10-CM

## 2020-01-19 DIAGNOSIS — M25562 Pain in left knee: Secondary | ICD-10-CM

## 2020-01-19 MED ORDER — CELECOXIB 100 MG PO CAPS: 100.0000 mg | ORAL_CAPSULE | Freq: Every day | ORAL | 1 refills | Status: DC

## 2020-01-19 NOTE — Telephone Encounter (Signed)
[name] ingram called in for refill for his mom  Karesha Weckman and wants it sent to [facility] not [facility]

## 2020-02-04 ENCOUNTER — Other Ambulatory Visit: Payer: Self-pay | Admitting: Internal Medicine

## 2020-02-04 DIAGNOSIS — F419 Anxiety disorder, unspecified: Secondary | ICD-10-CM

## 2020-02-09 ENCOUNTER — Other Ambulatory Visit: Payer: Self-pay | Admitting: Family

## 2020-02-09 DIAGNOSIS — F22 Delusional disorders: Secondary | ICD-10-CM

## 2020-02-09 DIAGNOSIS — G301 Alzheimer's disease with late onset: Secondary | ICD-10-CM

## 2020-02-15 ENCOUNTER — Other Ambulatory Visit: Payer: Self-pay

## 2020-02-15 MED ORDER — OMEPRAZOLE 20 MG PO CPDR: DELAYED_RELEASE_CAPSULE | ORAL | 1 refills | Status: DC

## 2020-02-19 ENCOUNTER — Encounter (HOSPITAL_COMMUNITY): Payer: Self-pay | Admitting: Emergency Medicine

## 2020-02-19 ENCOUNTER — Emergency Department (HOSPITAL_COMMUNITY)
Admission: EM | Admit: 2020-02-19 | Discharge: 2020-02-19 | Disposition: A | Discharge status: Home / Self Care | Payer: Medicare Other | Attending: Emergency Medicine | Admitting: Emergency Medicine

## 2020-02-19 DIAGNOSIS — R531 Weakness: Secondary | ICD-10-CM | POA: Insufficient documentation

## 2020-02-19 DIAGNOSIS — Z79899 Other long term (current) drug therapy: Secondary | ICD-10-CM | POA: Diagnosis not present

## 2020-02-19 DIAGNOSIS — Z85068 Personal history of other malignant neoplasm of small intestine: Secondary | ICD-10-CM | POA: Insufficient documentation

## 2020-02-19 DIAGNOSIS — I10 Essential (primary) hypertension: Secondary | ICD-10-CM | POA: Diagnosis not present

## 2020-02-19 DIAGNOSIS — Z7982 Long term (current) use of aspirin: Secondary | ICD-10-CM | POA: Insufficient documentation

## 2020-02-19 DIAGNOSIS — Z86718 Personal history of other venous thrombosis and embolism: Secondary | ICD-10-CM | POA: Diagnosis not present

## 2020-02-19 DIAGNOSIS — Z85828 Personal history of other malignant neoplasm of skin: Secondary | ICD-10-CM | POA: Insufficient documentation

## 2020-02-19 DIAGNOSIS — E039 Hypothyroidism, unspecified: Secondary | ICD-10-CM | POA: Diagnosis not present

## 2020-02-19 LAB — COMPREHENSIVE METABOLIC PANEL
ALT: 42 U/L (ref 0–44)
AST: 40 U/L (ref 15–41)
Albumin: 4 g/dL (ref 3.5–5.0)
Alkaline Phosphatase: 42 U/L (ref 38–126)
Anion gap: 9 (ref 5–15)
BUN: 19 mg/dL (ref 8–23)
CO2: 26 mmol/L (ref 22–32)
Calcium: 9.3 mg/dL (ref 8.9–10.3)
Chloride: 105 mmol/L (ref 98–111)
Creatinine, Ser: 0.73 mg/dL (ref 0.44–1.00)
GFR calc Af Amer: >60 mL/min (ref >60)
GFR calc non Af Amer: >60 mL/min (ref >60)
Glucose, Bld: 105 mg/dL — ABNORMAL HIGH (ref 70–99)
Potassium: 4.1 mmol/L (ref 3.5–5.1)
Sodium: 140 mmol/L (ref 135–145)
Total Bilirubin: 0.6 mg/dL (ref 0.3–1.2)
Total Protein: 7.3 g/dL (ref 6.5–8.1)

## 2020-02-19 LAB — URINALYSIS, ROUTINE W REFLEX MICROSCOPIC
Bacteria, UA: NONE SEEN
Bilirubin Urine: NEGATIVE
Glucose, UA: NEGATIVE mg/dL
Hgb urine dipstick: NEGATIVE
Ketones, ur: 5 mg/dL — AB
Leukocytes,Ua: NEGATIVE
Nitrite: NEGATIVE
Protein, ur: 30 mg/dL — AB
Specific Gravity, Urine: 1.031 — ABNORMAL HIGH (ref 1.005–1.030)
pH: 5 (ref 5.0–8.0)

## 2020-02-19 LAB — CBC
HCT: 38.5 % (ref 36.0–46.0)
Hemoglobin: 12.2 g/dL (ref 12.0–15.0)
MCH: 31.9 pg (ref 26.0–34.0)
MCHC: 31.7 g/dL (ref 30.0–36.0)
MCV: 100.8 fL — ABNORMAL HIGH (ref 80.0–100.0)
Platelets: 159 10*3/uL (ref 150–400)
RBC: 3.82 MIL/uL — ABNORMAL LOW (ref 3.87–5.11)
RDW: 12.3 % (ref 11.5–15.5)
WBC: 5.8 10*3/uL (ref 4.0–10.5)
nRBC: 0 % (ref 0.0–0.2)

## 2020-02-19 NOTE — Discharge Instructions (Signed)
You presented to the ED for concern of a brief period of weakness.  This has resolved and you are back to your baseline.  Your blood work was reassuring for no signs of infection, electrode abnormalities or kidney dysfunction.  Your urine showed no signs of infection.  Your nephew says you are back to your baseline and walking normally.  Please follow-up with your primary care physician as needed.

## 2020-02-19 NOTE — ED Triage Notes (Signed)
Pt here from home , sent over for a possible UTI and confusion , pt alert and oriented , pt does live alone

## 2020-02-19 NOTE — ED Provider Notes (Signed)
[facility] EMERGENCY DEPARTMENT Provider Note   CSN: [id number] Arrival date & time: 02/19/20  1302     History Chief Complaint  Patient presents with  . Weakness    Tonya Russell is a 86 y.o. female.  Patient is a relatively healthy 86 year old female who had trouble getting out of bed as she usually does in the morning and missed the [employer] provider, the set up a change of events in which police were called and then EMS called for transport to the hospital for further evaluation.  There are some concern for UTI.  Patient denies any complaints or concerns or falls.  Family agrees with this.  Patient does have some mild dementia but is otherwise living on her own functioning well with family checking up on her 4 times a day.  The history is provided by the patient, a relative and medical records.  Illness Location:  Constitutional Quality:  Generalized weakness and fatigue Severity:  Mild Onset quality:  Gradual Timing:  Constant Progression:  Resolved Chronicity:  Chronic Context:  Patient is 86 years old and has some mild dementia and generalized deconditioning Relieved by:  Time Worsened by:  Nothing Ineffective treatments:  None tried Associated symptoms: no abdominal pain, no chest pain, no congestion, no diarrhea, no fever, no headaches, no loss of consciousness, no nausea, no shortness of breath and no vomiting        Past Medical History:  Diagnosis Date  . Abdominal pain, epigastric   . Allergic rhinitis due to other allergen   . Allergic rhinitis, cause unspecified   . Dermatophytosis of nail   . Diffuse cystic mastopathy   . Diverticulosis of colon (without mention of hemorrhage)   . DVT (deep venous thrombosis) ([place])   . Dysphagia, unspecified(787.20)   . Esophageal reflux   . Family history of malignant neoplasm of gastrointestinal tract   . Hiatal hernia   . Hyperlipidemia   . Hypertension   . Internal hemorrhoids without  mention of complication   . 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 Obesity   . Oral aphthae   . Osteoarthrosis, unspecified whether generalized or localized, unspecified site   . Other abnormal blood chemistry   . Other voice and resonance disorders   . Pain in joint, pelvic region and thigh   . Palpitations   . Pancreatitis   . Peripheral vascular disease, unspecified ([place])   . Schatzki's ring   . Senile osteoporosis   . Torus fracture of fibula   . Unspecified constipation   . Unspecified hypothyroidism     Patient Active Problem List   Diagnosis Date Noted  . Senile osteoporosis   . 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 Primary osteoarthritis of left knee 10/30/2018  . MCI (mild cognitive impairment) with memory loss 10/30/2018  . Eustachian tube dysfunction 04/28/2018  . Incontinence of urine in female 07/13/2017  . Pain in left knee 01/02/2017  . Dizzy 10/12/2015  . Corn of foot 12/16/2013  . Hypothyroidism 01/26/2013  . Hyperglycemia   . Hyperlipidemia   . Hypertension   . 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[address])   . Peripheral vascular disease, unspecified ([place])   . Mildly obese 11/01/2008  . ESOPHAGEAL MOTILITY DISORDER 11/01/2008  . Dysphagia 11/01/2008  . Non-toxic nodular goiter 10/13/2008  . ANXIETY 10/13/2008  . Coronary atherosclerosis 10/13/2008  . Internal hemorrhoids 10/13/2008  . DIVERTICULOSIS, SIGMOID COLON 10/13/2008  . Chronic pancreatitis ([place]) 10/13/2008  .  GERD 09/25/2005    Past Surgical History:  Procedure Laterality Date  . APPENDECTOMY    . BASAL CELL CARCINOMA EXCISION     right humerus, right temple, left side of nose  . BASAL CELL CARCINOMA EXCISION     chest, left cheek  . CATARACT EXTRACTION, BILATERAL    . CHOLECYSTECTOMY    . keratosis     left arm  . resection of ampullary tumor    . squamous cell cancer     Right leg  . squamous cell cancer left triceps Left    Dr. Lorenza Cambridge  .  TONSILLECTOMY AND ADENOIDECTOMY       OB History   No obstetric history on file.     Family History  Problem Relation Age of Onset  . Cancer Mother        spindle cell carcinoma on neck  . Colon cancer Father 8  . Breast cancer Sister   . Colon polyps Sister   . Heart disease Brother   . Breast cancer Other        neiece x 3  . Diabetes Niece   . Esophageal cancer Neg Hx   . Pancreatic cancer Neg Hx   . Kidney disease Neg Hx   . Liver disease Neg Hx     Social History   Tobacco Use  . Smoking status: Never Smoker  . Smokeless tobacco: Never Used  Vaping Use  . Vaping Use: Never used  Substance Use Topics  . Alcohol use: No  . Drug use: No    Home Medications Prior to Admission medications   Medication Sig Start Date End Date Taking? Authorizing Provider  acetaminophen (TYLENOL) 500 MG tablet Take 2 tablets (1,000 mg total) by mouth 2 (two) times daily. 10/30/18  Yes Reed, Tiffany L, DO  alendronate (FOSAMAX) 70 MG tablet Take 1 tablet (70 mg total) by mouth once a week. Take with a full glass of water on an empty stomach. Patient taking differently: Take 70 mg by mouth every Saturday. Take with a full glass of water on an empty stomach. 11/30/19  Yes Ngetich, Dinah C, NP  aspirin EC 81 MG tablet Take 81 mg by mouth daily.   Yes [provider]  bimatoprost (LUMIGAN) 0.01 % SOLN Place 1 drop into both eyes at bedtime.   Yes [provider]  Calcium Carb-Cholecalciferol (CALCIUM 600+D3 PO) Take 2 tablets by mouth every evening.    Yes [provider]  celecoxib (CELEBREX) 100 MG capsule Take 1 capsule (100 mg total) by mouth daily. 01/19/20  Yes Reed, Tiffany L, DO  Cholecalciferol (VITAMIN D3) 50 MCG (2000 UT) capsule Take 2,000 Units by mouth daily.   Yes [provider]  DULoxetine (CYMBALTA) 60 MG capsule TAKE 1 CAPSULE BY MOUTH EVERY DAY Patient taking differently: Take 60 mg by mouth at bedtime.  02/04/20  Yes Reed, Tiffany L, DO    fluticasone (FLONASE) 50 MCG/ACT nasal spray Place 1 spray into both nostrils daily as needed for allergies or rhinitis.    Yes [provider]  gabapentin (NEURONTIN) 300 MG capsule Take one capsule by mouth once daily at bedtime Patient taking differently: Take 300 mg by mouth at bedtime.  12/23/19  Yes Reed, Tiffany L, DO  Inulin (FIBER CHOICE PO) Take 5 g by mouth daily as needed (to improve digestive health- mix and drink).    Yes [provider]  levothyroxine (SYNTHROID) 50 MCG tablet TAKE 1 TABLET DAILY FOR  THYROID SUPPLEMENT Patient taking differently: Take 50 mcg by mouth daily before breakfast.  06/29/19  Yes Reed, Tiffany L, DO  Multiple Vitamin (MULTIVITAMIN WITH MINERALS) TABS tablet Take 1 tablet by mouth daily with lunch.    Yes [provider]  nystatin (MYCOSTATIN/NYSTOP) powder Apply topically 4 (four) times daily. Patient taking differently: Apply 1 application topically See admin instructions. Apply under both breasts at bedtime 06/29/19  Yes Reed, Tiffany L, DO  Omega-3 Fatty Acids (FISH OIL) 1200 MG CAPS Take 1,200 mg by mouth 3 (three) times daily.    Yes [provider]  omeprazole (PRILOSEC) 20 MG capsule TAKE 1 CAPSULE DAILY TO REDUCE STOMACH ACID. Patient taking differently: Take 20 mg by mouth daily before breakfast.  02/15/20  Yes Reed, Tiffany L, DO  Polyethyl Glycol-Propyl Glycol (SYSTANE ULTRA OP) Place 1 drop into both eyes 3 (three) times daily as needed (dry eyes).    Yes [provider]  QUEtiapine (SEROQUEL) 25 MG tablet TAKE 1 TABLET BY MOUTH EVERYDAY AT BEDTIME Patient taking differently: Take 25 mg by mouth at bedtime.  02/09/20  Yes Ngetich, Dinah C, NP    Allergies    Penicillins, Codeine, Lodine [etodolac], and Wild lettuce [lactuca virosa]  Review of Systems   Review of Systems  Constitutional: Negative for fever.  HENT: Negative for congestion.   Respiratory: Negative for shortness of breath.    Cardiovascular: Negative for chest pain.  Gastrointestinal: Negative for abdominal pain, diarrhea, nausea and vomiting.  Musculoskeletal: Positive for arthralgias.  Neurological: Negative for loss of consciousness and headaches.  All other systems reviewed and are negative.   Physical Exam Updated Vital Signs BP (!) 165/71 (BP Location: Right Arm)   Pulse 67   Temp (!) 97.3 F (36.3 C) (Oral)   Resp 16   SpO2 96%   Physical Exam Vitals and nursing note reviewed.  Constitutional:      General: She is not in acute distress.    Appearance: She is well-developed.  HENT:     Head: Normocephalic and atraumatic.     Right Ear: External ear normal.     Left Ear: External ear normal.     Nose: Nose normal.     Mouth/Throat:     Mouth: Mucous membranes are moist.  Eyes:     Conjunctiva/sclera: Conjunctivae normal.  Cardiovascular:     Rate and Rhythm: Normal rate and regular rhythm.     Heart sounds: No murmur heard.   Pulmonary:     Effort: Pulmonary effort is normal. No respiratory distress.     Breath sounds: Normal breath sounds.  Abdominal:     Palpations: Abdomen is soft.     Tenderness: There is no abdominal tenderness.  Musculoskeletal:     Cervical back: Neck supple.     Right lower leg: No edema.     Left lower leg: No edema.     Comments: Patient has some tenderness over the right hip, this is baseline and chronic for years per patient and family  Skin:    General: Skin is warm and dry.  Neurological:     General: No focal deficit present.     Mental Status: She is alert. Mental status is at baseline.     Gait: Gait normal.  Psychiatric:        Mood and Affect: Mood normal.        Behavior: Behavior normal.     ED Results / Procedures / Treatments   Labs (  all labs ordered are listed, but only abnormal results are displayed) Labs Reviewed  CBC - Abnormal; Notable for the following components:      Result Value   RBC 3.82 (*)    MCV 100.8 (*)    All  other components within normal limits  COMPREHENSIVE METABOLIC PANEL - Abnormal; Notable for the following components:   Glucose, Bld 105 (*)    All other components within normal limits  URINALYSIS, ROUTINE W REFLEX MICROSCOPIC - Abnormal; Notable for the following components:   Specific Gravity, Urine 1.031 (*)    Ketones, ur 5 (*)    Protein, ur 30 (*)    All other components within normal limits    EKG EKG Interpretation  Date/Time:  Friday February 19 2020 13:54:15 EDT Ventricular Rate:  72 PR Interval:  162 QRS Duration: 86 QT Interval:  394 QTC Calculation: 431 R Axis:   25 Text Interpretation: Normal sinus rhythm Nonspecific ST and T wave abnormality Abnormal ECG Confirmed by Carmin Muskrat [phone]) on 02/19/2020 8:37:38 PM   Radiology No results found.  Procedures Procedures (including critical care time)  Medications Ordered in ED Medications - No data to display  ED Course  I have reviewed the triage vital signs and the nursing notes.  Pertinent labs & imaging results that were available during my care of the patient were reviewed by me and considered in my medical decision making (see chart for details).    MDM Rules/Calculators/A&P                          Differential diagnosis: Fatigue, deconditioning with age, bystander concern  ED physician interpretation of EKG: No STEMI.  Normal sinus rhythm ED physician interpretation of labs: Patient's lab work without signs of renal dysfunction, urinary tract infection, anemia.  MDM: Patient is a 67-year-old female presented to ED due to concern by neighbors for an episode of generalized weakness that is since resolved found to be at baseline per patient and family appropriate for discharge home and continued follow-up with primary care.  Patient's vital signs are stable, patient afebrile.  Patient's physical exam is unremarkable.  No acute injuries reported or identified.  Patient ambulated at her baseline per family.   Patient's nephew was at bedside and he checks on her 4 times a day and make sure that she gets all her medications.  Pam understands she has worsening dementia and they have a waiting list for skilled nursing facility but at this time they are comfortable continue to care for her in her home.  No further emergency medical interventions indicated at this time.  Key discharge instructions: weakness.  This has resolved and you are back to your baseline.  Your blood work was reassuring for no signs of infection, electrode abnormalities or kidney dysfunction.  Your urine showed no signs of infection.  Your nephew says you are back to your baseline and walking normally.  Please follow-up with your primary care physician as needed. Final Clinical Impression(s) / ED Diagnoses Final diagnoses:  Weakness    Rx / DC Orders ED Discharge Orders    None       Delma Post, MD 02/20/20 1219    Carmin Muskrat, MD 02/22/20 [phone]

## 2020-02-19 NOTE — ED Notes (Signed)
Pt ambulated in hallway with use of walker, steady with assistance of walker. (pt uses a walker/cane at home).

## 2020-02-19 NOTE — ED Notes (Signed)
ED Provider at bedside. 

## 2020-02-22 DIAGNOSIS — E039 Hypothyroidism, unspecified: Secondary | ICD-10-CM | POA: Diagnosis not present

## 2020-02-22 DIAGNOSIS — I10 Essential (primary) hypertension: Secondary | ICD-10-CM | POA: Diagnosis not present

## 2020-02-23 ENCOUNTER — Encounter: Payer: Self-pay | Admitting: Family

## 2020-02-23 ENCOUNTER — Other Ambulatory Visit: Payer: Self-pay

## 2020-02-23 ENCOUNTER — Ambulatory Visit (INDEPENDENT_AMBULATORY_CARE_PROVIDER_SITE_OTHER): Payer: Medicare Other | Admitting: Family

## 2020-02-23 VITALS — BP 128/80 | HR 73 | Temp 96.9°F | Resp 16 | Ht 63.0 in | Wt 161.0 lb

## 2020-02-23 DIAGNOSIS — F22 Delusional disorders: Secondary | ICD-10-CM | POA: Diagnosis not present

## 2020-02-23 DIAGNOSIS — M25561 Pain in right knee: Secondary | ICD-10-CM

## 2020-02-23 DIAGNOSIS — G301 Alzheimer's disease with late onset: Secondary | ICD-10-CM | POA: Diagnosis not present

## 2020-02-23 DIAGNOSIS — R296 Repeated falls: Secondary | ICD-10-CM

## 2020-02-23 DIAGNOSIS — I739 Peripheral vascular disease, unspecified: Secondary | ICD-10-CM

## 2020-02-23 DIAGNOSIS — M810 Age-related osteoporosis without current pathological fracture: Secondary | ICD-10-CM

## 2020-02-23 DIAGNOSIS — R5381 Other malaise: Secondary | ICD-10-CM

## 2020-02-23 DIAGNOSIS — E039 Hypothyroidism, unspecified: Secondary | ICD-10-CM | POA: Diagnosis not present

## 2020-02-23 DIAGNOSIS — F02818 Dementia in other diseases classified elsewhere, unspecified severity, with other behavioral disturbance: Secondary | ICD-10-CM

## 2020-02-23 DIAGNOSIS — G8929 Other chronic pain: Secondary | ICD-10-CM

## 2020-02-23 DIAGNOSIS — F0281 Dementia in other diseases classified elsewhere with behavioral disturbance: Secondary | ICD-10-CM | POA: Diagnosis not present

## 2020-02-23 DIAGNOSIS — E785 Hyperlipidemia, unspecified: Secondary | ICD-10-CM

## 2020-02-23 MED ORDER — NYSTATIN 100000 UNIT/GM EX POWD: 1.0000 "application " | Freq: Four times a day (QID) | CUTANEOUS | 1 refills | Status: DC

## 2020-02-23 NOTE — Patient Instructions (Signed)
-   [place] 2 form filled out today  - Placard form completed

## 2020-02-23 NOTE — Progress Notes (Signed)
Provider: Teshia Mahone FNP-C  Gayland Curry, DO  Patient Care Team: Gayland Curry, DO as PCP - General (Geriatric Medicine) [name], MD as Consulting Physician (Gastroenterology) Stanford Breed Denice Bors, MD as Consulting Physician (Cardiology) Druscilla Brownie, MD as Consulting Physician (Dermatology) [name], MD as Consulting Physician (Ophthalmology) Standley Brooking, LCSW as Social Worker Thana Ates, RN as [facility] [facility]  Extended Emergency Contact Information Primary Emergency Contact: Causey,Edna Address: [place] [address] [name] of [place] Phone: [phone] Relation: Friend Secondary Emergency Contact: [place],  [place] of [place] Phone: [phone] Relation: Sister  Code Status:  Full Code  Goals of care: Advanced Directive information Advanced Directives 02/23/2020  Does Patient Have a Medical Advance Directive? Yes  Type of Advance Directive Living will;Healthcare Power of [place];Out of facility DNR (pink MOST or yellow form)  Does patient want to make changes to medical advance directive? No - Patient declined  Copy of [place] in Chart? Yes - validated most recent copy scanned in chart (See row information)  Pre-existing out of facility DNR order (yellow form or pink MOST form) -     Chief Complaint  Patient presents with  . Hospitalization Follow-up    ER Follow Up/ 3 falls since hosptial visit, Patient has to be placed in SNF.    HPI:  Pt is a 86 y.o. female seen today for an acute visit for follow up ED visit.she was seen in the ED 02/19/2020 for generalized weakness and possible UTI after she had trouble getting out of bed as she usually does in the morning and missed the meals on wheels provider,setting up a change of events in which police were called.EMS called and was transported to the ED for evaluation.In the ED  her EKG done showed Normal sinus rhythm with non-specific ST and T-wave abnormalities .Her labs done were unremarkable.Her urine analysis specimen showed yellow clear urine negative for nitrite,leukocytes,bacteria and RBC.she had 5+ ketones and 30+ protein.she was afebrile.Her condition stabilized.Her Nephew stated patient was back to baseline and was discharged home on Keflex   she has had x 3 fall episode since ED visit.Fell on Friday and Saturday in the bedroom.Sunday fell in the bathroom.  Chronic Bilateral knee pain and hip pain.right hip    Past Medical History:  Diagnosis Date  . Abdominal pain, epigastric   . Allergic rhinitis due to other allergen   . Allergic rhinitis, cause unspecified   . Dermatophytosis of nail   . Diffuse cystic mastopathy   . Diverticulosis of colon (without mention of hemorrhage)   . DVT (deep venous thrombosis) ([place])   . Dysphagia, unspecified(787.20)   . Esophageal reflux   . Family history of malignant neoplasm of gastrointestinal tract   . Hiatal hernia   . Hyperlipidemia   . Hypertension   . Internal hemorrhoids without mention of complication   . 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 Obesity   . Oral aphthae   . Osteoarthrosis, unspecified whether generalized or localized, unspecified site   . Other abnormal blood chemistry   . Other voice and resonance disorders   . Pain in joint, pelvic region and thigh   . Palpitations   . Pancreatitis   . Peripheral vascular disease, unspecified ([place])   . Schatzki's ring   . Senile  osteoporosis   . Torus fracture of fibula   . Unspecified constipation   . Unspecified hypothyroidism    Past Surgical History:  Procedure Laterality Date  . APPENDECTOMY    . BASAL CELL CARCINOMA EXCISION     right humerus, right temple, left side of nose  . BASAL CELL CARCINOMA EXCISION     chest, left cheek  . CATARACT EXTRACTION, BILATERAL    . CHOLECYSTECTOMY    . keratosis     left arm  . resection  of ampullary tumor    . squamous cell cancer     Right leg  . squamous cell cancer left triceps Left    Dr. Lorenza Cambridge  . TONSILLECTOMY AND ADENOIDECTOMY      Allergies  Allergen Reactions  . Penicillins Other (See Comments)    Exact reaction not recalled- has not been taken "in a long time" Has patient had a PCN reaction causing immediate rash, facial/tongue/throat swelling, SOB or lightheadedness with hypotension: yes Has patient had a PCN reaction causing severe rash involving mucus membranes or skin necrosis: no Has patient had a PCN reaction that required hospitalization: no Has patient had a PCN reaction occurring within the last 10 years: no If all of the above answers are "NO", then may proceed with Cephalosporin   . Codeine Nausea And Vomiting  . Lodine [Etodolac] Nausea And Vomiting  . Wild Lettuce [Lactuca Virosa] Diarrhea    Outpatient Encounter Medications as of 02/23/2020  Medication Sig  . acetaminophen (TYLENOL) 500 MG tablet Take 500 mg by mouth in the morning, at noon, and at bedtime.  Marland Kitchen alendronate (FOSAMAX) 70 MG tablet Take 70 mg by mouth every Saturday. Take with a full glass of water on an empty stomach.  Marland Kitchen aspirin EC 81 MG tablet Take 81 mg by mouth daily.  . bimatoprost (LUMIGAN) 0.01 % SOLN Place 1 drop into both eyes at bedtime.  . Calcium Carb-Cholecalciferol (CALCIUM 600+D3 PO) Take 2 tablets by mouth every evening.   . celecoxib (CELEBREX) 100 MG capsule Take 1 capsule (100 mg total) by mouth daily.  . Cholecalciferol (VITAMIN D3) 50 MCG (2000 UT) capsule Take 2,000 Units by mouth daily.  . DULoxetine (CYMBALTA) 60 MG capsule Take 60 mg by mouth at bedtime.  . fluticasone (FLONASE) 50 MCG/ACT nasal spray Place 1 spray into both nostrils daily as needed for allergies or rhinitis.   Marland Kitchen gabapentin (NEURONTIN) 300 MG capsule Take 300 mg by mouth at bedtime.  . Inulin (FIBER CHOICE PO) Take 5 g by mouth daily as needed (to improve digestive health- mix and  drink).   Marland Kitchen levothyroxine (SYNTHROID) 50 MCG tablet Take 50 mcg by mouth daily before breakfast.  . Multiple Vitamin (MULTIVITAMIN WITH MINERALS) TABS tablet Take 1 tablet by mouth daily with lunch.   . nystatin (MYCOSTATIN/NYSTOP) powder Apply 1 application topically in the morning, at noon, in the evening, and at bedtime. Under Breast.  . Omega-3 Fatty Acids (FISH OIL) 1200 MG CAPS Take 1,200 mg by mouth 3 (three) times daily.   Marland Kitchen omeprazole (PRILOSEC) 20 MG capsule Take 20 mg by mouth daily before breakfast.  . Polyethyl Glycol-Propyl Glycol (SYSTANE ULTRA OP) Place 1 drop into both eyes 3 (three) times daily as needed (dry eyes).   . QUEtiapine (SEROQUEL) 25 MG tablet Take 25 mg by mouth at bedtime.  . [DISCONTINUED] acetaminophen (TYLENOL) 500 MG tablet Take 2 tablets (1,000 mg total) by mouth 2 (two) times daily.  . [DISCONTINUED] nystatin (  MYCOSTATIN/NYSTOP) powder Apply 1 application topically in the morning, at noon, in the evening, and at bedtime. Under Breast.  . [DISCONTINUED] alendronate (FOSAMAX) 70 MG tablet Take 1 tablet (70 mg total) by mouth once a week. Take with a full glass of water on an empty stomach. (Patient taking differently: Take 70 mg by mouth every Saturday. Take with a full glass of water on an empty stomach.)  . [DISCONTINUED] DULoxetine (CYMBALTA) 60 MG capsule TAKE 1 CAPSULE BY MOUTH EVERY DAY  . [DISCONTINUED] gabapentin (NEURONTIN) 300 MG capsule Take one capsule by mouth once daily at bedtime (Patient taking differently: Take 300 mg by mouth at bedtime. )  . [DISCONTINUED] levothyroxine (SYNTHROID) 50 MCG tablet TAKE 1 TABLET DAILY FOR THYROID SUPPLEMENT (Patient taking differently: Take 50 mcg by mouth daily before breakfast. )  . [DISCONTINUED] nystatin (MYCOSTATIN/NYSTOP) powder Apply topically 4 (four) times daily.  . [DISCONTINUED] omeprazole (PRILOSEC) 20 MG capsule TAKE 1 CAPSULE DAILY TO REDUCE STOMACH ACID.  . [DISCONTINUED] QUEtiapine (SEROQUEL) 25 MG  tablet TAKE 1 TABLET BY MOUTH EVERYDAY AT BEDTIME (Patient taking differently: Take 25 mg by mouth at bedtime. Seroquel 25)   No facility-administered encounter medications on file as of 02/23/2020.    Review of Systems  Constitutional: Negative for appetite change, chills, fatigue and fever.  HENT: Positive for hearing loss. Negative for congestion, postnasal drip, rhinorrhea, sinus pressure, sinus pain, sneezing, sore throat, tinnitus and trouble swallowing.   Eyes: Positive for visual disturbance. Negative for discharge, redness and itching.       Wears eye glasses follow up ophthalmology   Respiratory: Negative for cough, chest tightness, shortness of breath and wheezing.   Cardiovascular: Positive for leg swelling. Negative for chest pain and palpitations.  Gastrointestinal: Negative for abdominal distention, abdominal pain, constipation, diarrhea, nausea and vomiting.  Endocrine: Negative for cold intolerance, heat intolerance, polydipsia, polyphagia and polyuria.  Genitourinary: Negative for difficulty urinating, dysuria, flank pain, urgency and vaginal discharge.       Incontinence   Musculoskeletal: Positive for arthralgias and gait problem. Negative for neck pain.       Bilateral knee and hip pain   Skin: Negative for color change, pallor, rash and wound.  Neurological: Negative for dizziness, syncope, speech difficulty, light-headedness and headaches.       Right ankle numbness   Hematological: Does not bruise/bleed easily.  Psychiatric/Behavioral: Positive for confusion and hallucinations. Negative for agitation, self-injury, sleep disturbance and suicidal ideas. The patient is not nervous/anxious.        Children come at night and get in the bed.Seroquel has helped.     Immunization History  Administered Date(s) Administered  . Fluad Quad(high Dose 65+) 06/04/2019  . Influenza, High Dose Seasonal PF 05/09/2017, 06/19/2018  . Influenza,inj,Quad PF,6+ Mos 06/17/2013,  06/23/2014, 09/07/2015, 07/04/2016  . Influenza-Unspecified 07/09/2012  . PFIZER SARS-COV-2 Vaccination 11/02/2019  . Pneumococcal Conjugate-13 07/11/2018  . Pneumococcal Polysaccharide-23 07/04/2016  . Pneumococcal-Unspecified 09/10/1992  . Tdap 09/11/2007, 07/11/2018  . Zoster 11/28/2010  . Zoster Recombinat (Shingrix) 07/09/2017, 10/23/2017   Pertinent  Health Maintenance Due  Topic Date Due  . INFLUENZA VACCINE  04/10/2020  . DEXA SCAN  Completed  . PNA vac Low Risk Adult  Completed   Fall Risk  02/23/2020 11/04/2019 07/13/2019 06/29/2019 02/26/2019  Falls in the past year? 1 0 0 0 0  Number falls in past yr: 1 0 0 - 0  Injury with Fall? 0 0 0 - 0  Comment - - - - -  Risk Factor Category  - - - - -  Risk for fall due to : - - - - -  Follow up - - - - -   Vitals:   02/23/20 1418  BP: 128/80  Pulse: 73  Resp: 16  Temp: (!) 96.9 F (36.1 C)  SpO2: 92%  Weight: 161 lb (73 kg)  Height: 5\' 3"  (1.6 m)   Body mass index is 28.52 kg/m. Physical Exam Vitals reviewed.  Constitutional:      General: She is not in acute distress.    Appearance: She is normal weight. She is not ill-appearing.  HENT:     Head: Normocephalic.     Right Ear: Tympanic membrane, ear canal and external ear normal. There is no impacted cerumen.     Left Ear: Tympanic membrane, ear canal and external ear normal. There is no impacted cerumen.     Nose: Nose normal. No congestion or rhinorrhea.     Mouth/Throat:     Mouth: Mucous membranes are dry.     Pharynx: Oropharynx is clear. No oropharyngeal exudate or posterior oropharyngeal erythema.  Eyes:     General: No scleral icterus.       Right eye: No discharge.        Left eye: No discharge.     Extraocular Movements: Extraocular movements intact.     Conjunctiva/sclera: Conjunctivae normal.     Pupils: Pupils are equal, round, and reactive to light.  Neck:     Vascular: No carotid bruit.  Cardiovascular:     Rate and Rhythm: Normal rate and  regular rhythm.     Pulses: Normal pulses.     Heart sounds: Normal heart sounds. No murmur heard.  No friction rub. No gallop.   Pulmonary:     Effort: Pulmonary effort is normal. No respiratory distress.     Breath sounds: Normal breath sounds. No wheezing, rhonchi or rales.  Chest:     Chest wall: No tenderness.  Abdominal:     General: Bowel sounds are normal. There is no distension.     Palpations: Abdomen is soft. There is no mass.     Tenderness: There is no abdominal tenderness. There is no right CVA tenderness, left CVA tenderness, guarding or rebound.  Musculoskeletal:        General: No swelling or tenderness.     Cervical back: Normal range of motion. No rigidity or tenderness.     Right lower leg: No edema.     Left lower leg: No edema.     Comments: Unsteady gait.  Lymphadenopathy:     Cervical: No cervical adenopathy.  Skin:    General: Skin is warm.     Coloration: Skin is not pale.     Findings: No bruising, erythema or rash.  Neurological:     Mental Status: She is oriented to person, place, and time.     Cranial Nerves: No cranial nerve deficit.     Sensory: No sensory deficit.     Motor: No weakness.     Coordination: Coordination normal.     Gait: Gait normal.     Deep Tendon Reflexes: Reflexes normal.  Psychiatric:        Mood and Affect: Mood normal.        Speech: Speech normal.        Behavior: Behavior normal.        Cognition and Memory: Cognition is impaired. Memory is impaired.  Judgment: Judgment normal.    Labs reviewed: Recent Labs    06/29/19 1200 11/10/19 1205 02/19/20 1413  NA 143 143 140  K 4.3 4.2 4.1  CL 106 105 105  CO2 29 27 26   GLUCOSE 97 98 105*  BUN 16 18 19   CREATININE 0.64 0.80 0.73  CALCIUM 9.3 9.3 9.3   Recent Labs    11/10/19 1205 02/19/20 1413  AST 23 40  ALT 22 42  ALKPHOS  --  42  BILITOT 0.6 0.6  PROT 6.9 7.3  ALBUMIN  --  4.0   Recent Labs    06/29/19 1200 11/10/19 1205 02/19/20 1413    WBC 5.7 7.0 5.8  NEUTROABS 3,164 4,774  --   HGB 11.9 12.6 12.2  HCT 36.1 38.3 38.5  MCV 98.6 98.0 100.8*  PLT 167 207 159   Lab Results  Component Value Date   TSH 1.89 11/10/2019   Lab Results  Component Value Date   HGBA1C 5.3 07/01/2017   Lab Results  Component Value Date   CHOL 265 (H) 07/01/2017   HDL 93 07/01/2017   LDLCALC 154 (H) 07/01/2017   LDLDIRECT 113.2 10/18/2006   TRIG 75 07/01/2017   CHOLHDL 2.8 07/01/2017    Significant Diagnostic Results in last 30 days:  No results found.  Assessment/Plan 1. Physical deconditioning Has had frequent fall episodes and generalized muscle weakness requires higher level and supervision.Family will transfer to skilled Nursing.FL 2 form filled and completed this visit signed and given to [place] for Dr.Reed to signed.patient's POA aware to pick up FL2 on Thursday 02/25/2020.  2. Hypothyroidism, unspecified type  Lab Results  Component Value Date   TSH 1.89 11/10/2019  Continue on levothyroxine 50 mcg tablet daily.  3. Hyperlipidemia, unspecified hyperlipidemia type LDL not at goal.Not on statin.  4. Chronic pain of right knee Continue on Tylenol 500 mg tablet three times daily.  5. Peripheral vascular disease, unspecified ([place]) No pain reported.various color changes on feet and legs.  6. Multiple falls Facility Physical therapy for ROM,exercise,gait stability and muscle strengthening.Placard form completed due to inability to ambulate long distance.   7. Senile osteoporosis Continue calcium- vit D supplement.   8. Delusions ([place]) Ongoing.continue on Seroquel.  9. Late onset Alzheimer's disease with behavioral disturbance ([place]) Requires assist ance with her ADL's progressive decline.Transfer to higher level of care in skilled care Nursing.   Family/ staff Communication: Reviewed plan of care with patient and POA verbalized understanding.   Labs/tests ordered: None   Next Appointment: As needed patient will  transfer to [facility] facility with in house provider.   Sandrea Hughs, NP

## 2020-02-26 ENCOUNTER — Other Ambulatory Visit: Payer: Self-pay

## 2020-02-26 NOTE — Patient Outreach (Signed)
[facility]) Care Management  02/26/2020  Ameyah Bangura 01-Jul-1925 [id number]    New referral from MD office for concern about level of care. Contact person named was [name] [phone]  Place initial outreach call with no answer. Left a message.   PLAN: call back in 3 days. Will send unsuccessful outreach letter.  Tomasa Rand, RN, BSN, CEN [facility] [employer] [phone]

## 2020-02-29 ENCOUNTER — Other Ambulatory Visit: Payer: Self-pay

## 2020-02-29 ENCOUNTER — Other Ambulatory Visit: Payer: Self-pay | Admitting: *Deleted

## 2020-02-29 NOTE — Patient Outreach (Signed)
[facility]) Care Management  02/29/2020  Tonya Russell 02-05-1925 [id number]  CSW was able to make initial contact with patient's nephew and primary caregiver, [name] today to perform the phone assessment on patient, as well as assess and assist with social work needs and services.  CSW introduced self, explained role and types of services provided through [facility] ([place] [facility]).  CSW further explained to Mr. [name] that CSW works with patient's RNCM, also with [facility], Tomasa Rand.  CSW then explained the reason for the call, indicating that Ms. Lacinda Axon thought that patient and Mr. [name] would benefit from social work services and resources to assist with urgent placement into a skilled nursing facility for short-term rehabilitative services, then placement into an assisted living facility for long-term care services, once discharged from skilled care.  CSW obtained two HIPAA compliant identifiers from Mr. [name], which included patient's name and date of birth.  Mr. [name] admitted that he is definitely in need of assistance with placement for patient, but was somewhat confused about patient's current recommended level of care.  Mr. [name] reported that patient needs intensive inpatient rehabilitative services (physical therapy and occupational therapy), as patient now requires assistance with activities of daily living and is unable to ambulate without maximum assistance.  Mr. [name] went on to say that patient is currently receiving home health nursing and physical therapy services, through [facility], also with [facility], but that they are also recommending a higher level of care for patient.  Mr. [name] indicated that he has already toured [facility] [place], [place], but was told by the admissions director at the facility that patient would require  a 3-day inpatient hospital stay in order for Medicare to pay for the first 21 days of skilled care.  CSW will contact Bary Castilla, Assistant Clinical Director with [facility], to inquire.  Mr. [name] stated that he is scheduled to tour [facility] [place], [place], as well as [facility], [facility], today to discuss possible placement for patient.  CSW explained to Mr. [name] that neither of these facilities offer rehabilitative services, nor would they be able to currently accommodate patient's recommended level of care.  Mr. [name] admitted that he has possession of patient's FL-2 Form, and plans to hand deliver a copy of the Form to each of the admissions directors today, despite patient's recommended level of care on the Form being skilled nursing.  CSW reminded Mr. [name] that he needs to be touring skilled nursing facilities for patient, at present, then once patient is placed into a skilled facility and receives rehabilitative services, he can then begin looking at assisted living facilities for patient to transition into, once she is discharged from skilled care.  CSW explained to Mr. [name] that [place] would need to contact patient's physical therapist with [facility], to obtain therapy notes, and recommended level of care, as well as retrieve a copy of patient's FL-2 Form, to fax to all skilled nursing facilities of interest, to try and pursue bed offers.  CSW will inquire about the [facility] Waiver to see if patient is eligible, as patient only has Traditional Medicare and has not had an inpatient hospital stay of 3 days or greater within the last year.  Mr. [name] admitted that patient definitely cannot afford to pay for skilled nursing care out-of-pocket, needing to be able to  utilize her Medicare benefit for at least the first 21 days of skilled care.  Mr. [name] went on to explain that patient can also not afford  to pay for assisted living placement, realizing that he will need to sell patient's home and spend down a majority of her assets, in order to apply for [place] Medicaid, through the [place], in the county for which patient will reside.     Mr. [name] gave CSW the following contact names with [facility], [facility], [facility], [place], but could not provide contact information, nor was he able to recall which person provides which service to patient in the home.  CSW was able to make contact with [facility] (# [phone]), Occupational Therapist with [facility], to request therapy notes, not only from her, but also from [facility], Physical Therapy Assistant, also with [facility].  Callie informed CSW that she would need to speak with [name], Owner of [facility], to obtain consent to fax CSW therapy notes, as they are not required to document traditional therapy notes because they do not bill for their services.  CSW will inquire as to whether or not patient will need to provide proof of a negative COVID-19 Screening, within 3 days of admission into a skilled nursing facility.  If so, CSW will assist Mr. [name] with scheduling the screening, as well as offer to provide transportation, if necessary.    CSW agreed to follow-up with Mr. [name] as soon as CSW is able to obtain a copy of patient's FL-2 Form, therapy notes and authorization to waive the 3-day inpatient hospital stay, or on Thursday, March 03, 2020, around 9:00am, whichever comes first.  Upon receipt of this information, CSW will request a list of skilled nursing facilities from Mr. [name], then proceed to fax the information to these facilities of interest, in hopes of receiving bed offers.  CSW encouraged Mr. [name] to consider putting patient's home on the market to sell, begin spending down her assets, and apply for [place] Medicaid, as each of these tasks can be  quite time-consuming and lengthy in process.  Mr. [name] voiced understanding and was agreeable to this plan.  CSW was able to confirm that Mr. [name] has the correct contact information for CSW, encouraging him to contact CSW directly if he has questions, or if additional social work needs arise in the meantime.  [name], BSW, MSW, LCSW  Licensed Education officer, [facility]  Mailing [place] [address], [place], [place] [address] Physical Address-[address], [place], [place] [address] Toll Free Main # [phone] Fax # [phone] Cell # [phone]  Office # [phone] [email or web address]

## 2020-02-29 NOTE — Patient Outreach (Signed)
[facility]) Care Management  02/29/2020  Tonya Russell 08/13/1925 [id number]   New referral for level of care concerns from [facility].  Placed 2nd call to nephew Tonya Russell, who is point of contact.  Tonya Russell states that patient has been very weak and sitting in the floor when not able to walk. Meals on wheels, contacted [facility] to do a well fair check an EMS was called and patient taken to the hospital. Discharged from hospital.  Tonya Russell has been staying with patient 24/7 because patient has no children.  Nephew states they have toured [facility], [facility] and [facility]. Reports he has the FL2 form completed. Needs assistance from social worker for ASAP placement.  PLAN: will place urgent referral to [facility] social worker.Nephew reports that he manages all medications and denies any other concerns.   Will place order for Tonya Russell social worker.  Tomasa Rand, RN, BSN, CEN [facility] [employer] [phone]

## 2020-03-01 ENCOUNTER — Encounter: Payer: Self-pay | Admitting: *Deleted

## 2020-03-03 ENCOUNTER — Other Ambulatory Visit: Payer: Self-pay | Admitting: *Deleted

## 2020-03-03 NOTE — Patient Outreach (Signed)
[name]  03/03/2020  Tonya Russell [birth year] [id number]   CSW was able to make contact with patient's nephew and primary caregiver, Tonya Russell today to follow-up regarding placement arrangements for patient.  [name] reported that he was able to tour [facility] [place], [place], as well as [facility], [place], yesterday and that the admissions directors for both facilities admitted that they are able to accommodate patient's needs and current level of care, despite patient's FL-2 Form recommending SNF, or [facility], level of care.  However, patient is unable to private pay at either of these facilities, nor do they have any [place] Medicaid beds available.   CSW encouraged [name] to revisit skilled nursing placement for patient, not only because it is the recommended level of care, but also because [name] believes that patient is really benefiting from current in-home physical therapy and occupational therapy.  [name] admitted that he liked [facility] [place], wanting patient to be placed there if he decides to go the skilled nursing route.  [name] reported that he is "completely torn as to what to do", requesting to be able to contact patient's Primary Care Physician, Tonya Russell to discuss at length, before moving forward, wanting to ensure that he makes the right decision on behalf of patient.  CSW explained to [name] that CSW has placed a request to [facility] to obtain patient's in-home physical therapy and occupational therapy notes, just awaiting receipt.  CSW has also received a 3 Day SNF Waiver from Tonya Russell for this Medicare Next Gen patient.  With that being said, if CSW and [name] are able to find a skilled nursing facility that is willing to accept  patient, the first 21 days of skilled care will be covered under patient's Medicare benefit.  CSW reminded [name] that placing patient into a skilled nursing facility for short-term rehabilitative services will actually buy him more time to locate the most suitable long-term care assisted living facility for patient, as well as place patient's home on the market to sell.   CSW thoroughly reviewed the process of applying for Special Assistance Long-Term Care Medicaid to [name], explaining that he will need to submit the completed application, and all necessary paperwork, to the [place] for approval/denial.  [name] agreed to follow-up with CSW as soon as he has had an opportunity to talk with Tonya Russell, so we both know how to proceed, with regards to placement and appropriate level of care for patient.  CSW will outreach to [name] again next week, on Thursday, March 10, 2020, around 9:00am, if a return call has not been received by Tonya Russell by then.  CSW was able to confirm that [name] has the correct contact information for CSW.  [name], BSW, MSW, LCSW  Licensed Education officer, environmental Health System  Mailing [place] [address] [place], [place] [address] Physical Address-[address], [place],  [address] Toll Free Main # [phone] Fax # [phone] Cell # [phone]  Office # [phone] [email or web address]

## 2020-03-04 ENCOUNTER — Ambulatory Visit: Payer: Self-pay | Admitting: *Deleted

## 2020-03-04 NOTE — Patient Outreach (Signed)
[facility]) Care Management  03/04/2020  Kamiah Fite 12-08-1924 [id number]   CSW received a return call from patient's nephew, [name] indicating that he called Dr. Jonelle Sidle Reed's office, patient's Primary Care Physician, and was told that Dr. Mariea Clonts would be out of the office until late next week.  Mr. [name] then stated, "I'm going to make an executive decision and place Madalina into rehab before having her go into a long-term care assisted living facility".  CSW voiced understanding, agreeing to go ahead and fax patient's completed and signed FL-2 Form to the Admission Department at [facility] and [place] [facility] [place], [place] of choice.  CSW also agreed to fax the facility patient's home health physical therapy and occupational therapy notes, once received from [facility].  CSW sent a HIPAA compliant email message to [name], Owner of [facility], to inquire about CSW's request.  CSW then agreed to follow-up with Mr. [name] to report findings, once [facility] has had an opportunity to review patient's information and make a decision regarding a bed offer.  [name], BSW, MSW, LCSW  Licensed Education officer, environmental Health System  Mailing [place] [address], [place], [place] [address] Physical Address-[address], [place], [place] [address] Toll Free Main # [phone] Fax # [phone] Cell # [phone]  Office # [phone] [email or web address]

## 2020-03-07 ENCOUNTER — Ambulatory Visit: Payer: Medicare Other | Admitting: Family

## 2020-03-08 ENCOUNTER — Ambulatory Visit: Payer: Medicare Other | Admitting: Family

## 2020-03-09 ENCOUNTER — Other Ambulatory Visit: Payer: Self-pay | Admitting: *Deleted

## 2020-03-09 NOTE — Patient Outreach (Signed)
[facility]) Care Management  03/09/2020  Tonya Russell 1924-11-19 [id number]   CSW was able to make contact with Tonya Russell, Admissions Coordinator at [facility], patient's facility of choice, today to discuss bed availability for patient.  CSW explained to Tonya Russell that CSW will be emailing her ([email or web address]) all of the following information on patient, per her request:  FL-2 Form, history and physical, medication list, demographics, copies of insurance cards, physical therapy and occupational therapy notes, etc., so that she can review this information with the admissions director to determine whether or not they are able to make a bed offer on patient.  Tonya Russell was able to confirm that she received CSW's email, while conversing with CSW over the phone, agreeing to review the information with Tonya Russell, Admissions Director, today and then contact CSW to report findings.  CSW was able to make contact with patient's nephew, Tonya Russell today to explain that all necessary paperwork has been emailed to Tonya Russell and that she and Tonya Russell are in the process of determining whether or not they are able to accommodate patient's needs at their skilled nursing facility.  CSW further explained to [name] that Tonya Russell's biggest concern is being able to get Medicare to agree to pay for the first 21 days of skilled care, without a 3 day inpatient hospital stay, especially since patient will be coming from home.  CSW agreed to follow-up with [name] as soon as a return call is received from Tonya Russell or Tonya Russell.  [name] has already reported that he will not be able to private pay for patient to receive rehabilitative services in a skilled nursing facility, hoping that patient will be approved through Medicare.    [name], BSW, MSW, LCSW  Licensed Education officer, environmental  Health System  Mailing [place] [address] [place], [place] [address] Physical Address-[address], [place], [place] [address] Toll Free Main # [phone] Fax # [phone] Cell # [phone]  Office # [phone] [email or web address]

## 2020-03-10 ENCOUNTER — Other Ambulatory Visit: Payer: Self-pay | Admitting: *Deleted

## 2020-03-10 NOTE — Patient Outreach (Signed)
[facility]) Care Management  03/10/2020  Tonya Russell [birth year] [id number]   CSW was able to make contact with patient's nephew, Tonya Russell today to follow-up regarding placement arrangements for patient.  CSW explained to [name] that CSW has not received a return call from Tonya Russell, Admissions Coordinator at [facility], after emailing her all of the requested information yesterday.  [name] also denied having received a call.  CSW will give [facility] 24 hours to review patient's information and determine whether or not they can accommodate her needs, before calling a second time.  CSW then agreed to follow-up with [name] to report findings, as to whether or not the skilled nursing facility will make a bed offer on patient for her to receive short-term rehabilitative services.  Tonya Russell, BSW, MSW, LCSW  Licensed Education officer, environmental Health System  Mailing [place] [address] [place], [place] [address] Physical Address-[address], [place], [place] [address] Toll Free Main # [phone] Fax # [phone] Cell # [phone]  Office # [phone] [email or web address]

## 2020-03-11 ENCOUNTER — Ambulatory Visit (INDEPENDENT_AMBULATORY_CARE_PROVIDER_SITE_OTHER): Payer: Medicare Other | Admitting: Family

## 2020-03-11 ENCOUNTER — Encounter: Payer: Self-pay | Admitting: Family

## 2020-03-11 ENCOUNTER — Other Ambulatory Visit: Payer: Self-pay

## 2020-03-11 VITALS — BP 120/70 | Temp 97.3°F | Resp 16 | Ht 63.0 in | Wt 158.6 lb

## 2020-03-11 DIAGNOSIS — E039 Hypothyroidism, unspecified: Secondary | ICD-10-CM | POA: Diagnosis not present

## 2020-03-11 DIAGNOSIS — L84 Corns and callosities: Secondary | ICD-10-CM

## 2020-03-11 DIAGNOSIS — M1712 Unilateral primary osteoarthritis, left knee: Secondary | ICD-10-CM | POA: Diagnosis not present

## 2020-03-11 DIAGNOSIS — G301 Alzheimer's disease with late onset: Secondary | ICD-10-CM

## 2020-03-11 DIAGNOSIS — E785 Hyperlipidemia, unspecified: Secondary | ICD-10-CM | POA: Diagnosis not present

## 2020-03-11 DIAGNOSIS — F0281 Dementia in other diseases classified elsewhere with behavioral disturbance: Secondary | ICD-10-CM

## 2020-03-11 DIAGNOSIS — M810 Age-related osteoporosis without current pathological fracture: Secondary | ICD-10-CM

## 2020-03-11 DIAGNOSIS — F02818 Dementia in other diseases classified elsewhere, unspecified severity, with other behavioral disturbance: Secondary | ICD-10-CM

## 2020-03-16 ENCOUNTER — Other Ambulatory Visit: Payer: Self-pay | Admitting: *Deleted

## 2020-03-16 NOTE — Patient Outreach (Signed)
[place]) Care Management  03/16/2020  Breckin Zafar 1924-09-20 [id number]   CSW was able to make contact with Venida Jarvis, Admissions Director at [facility] and [facility], patient's skilled nursing facility of choice, today to check bed availability for patient, as patient would like to receive her short-term rehabilitative services at this facility.  Sherri requested patient's social security number, admitting that she needs to contact [facility] to ensure that patient has a PASARR number, now a requirement for all skilled nursing facility placements.  If not, Sherri reported that she will need to file for a PASARR number, which unfortunately, can be quite a lengthy process.  Sherri agreed to follow-up with CSW as soon as she is able to confirm, or deny, that patient has an existing PASARR number.  CSW was able to make contact with patient's nephew and primary caregiver, [name] today to provide an update on placement arrangements for patient.  CSW explained all of the above information to Mr. [name], agreeing to follow-up with him again as soon as a response is received from [facility].  Otherwise, CSW will follow-up with Mr. [name] again tomorrow, on Thursday, March 17, 2020, around noon.  Mr. [name] voiced understanding and was agreeable to this plan, most appreciative of CSW's persistence in trying to get patient placed.  [name], BSW, MSW, LCSW  Licensed Education officer, environmental Health System  Mailing [place] [address], [place], [place] [address] Physical Address-[address], [place], [place] [address] Toll Free Main # [phone] Fax # [phone] Cell # [phone]  Office # [phone] [email or web address]

## 2020-03-17 ENCOUNTER — Other Ambulatory Visit: Payer: Self-pay | Admitting: *Deleted

## 2020-03-17 NOTE — Patient Outreach (Signed)
[facility]) Care Management  03/17/2020  Maurie Musco 08-12-1925 [id number]   CSW was able to make contact with Venida Jarvis, Admissions Director at [facility] and [facility] [place] where patient would like to be placed for short-term rehabilitative services, to follow-up regarding placement arrangements for patient.  Sherri reported that patient "does not" have a prior [place] number; therefore, she will need to apply for one, which can be quite a lengthy process.  Sherri indicated that she will submit for the [facility] number today, contacting CSW directly if she requires additional information.  CSW was able to make contact with patient's nephew, [name] today to report findings of CSW's conversation with Sherri.  CSW explained to Mr. [name] that PASARR numbers are now a requirement for all skilled nursing facility placements.  CSW further explained to Mr. [name] that [facility] will need to obtain a new FL-2 Form on patient, from patient's Primary Care Physician, Dr. Hollace Kinnier, as patient's current FL-2 Form has not been filled out correctly and is missing pertinent information, nor does it indicate patient's appropriate recommended level of care.  CSW will complete the FL-2 Form and then fax to Dr. Mariea Clonts for review and signature.  CSW will then fax the new completed and signed FL-2 Form back to [facility], so that she can begin the process of obtaining a PASARR number for patient.  Fortunately, patient's FL-2 Form is about to expire, requiring CSW to complete a new form anyway.  CSW agreed to follow-up with Mr. [name] as soon as CSW has submitted all required information to [facility], or on Wednesday, March 23, 2020, around 10:00am, whichever comes first.  Mr. [name] voiced understanding and was agreeable to this plan.  [name], BSW, MSW, LCSW  Licensed Education officer, environmental Health  System  Mailing [place] [address] [place], [place] [address] Physical Address-[address], [place], [place] [address] Toll Free Main # [phone] Fax # [phone] Cell # [phone]  Office # [phone] [email or web address]

## 2020-03-18 ENCOUNTER — Other Ambulatory Visit: Payer: Medicare Other

## 2020-03-18 ENCOUNTER — Other Ambulatory Visit: Payer: Self-pay

## 2020-03-18 DIAGNOSIS — F02818 Dementia in other diseases classified elsewhere, unspecified severity, with other behavioral disturbance: Secondary | ICD-10-CM

## 2020-03-18 DIAGNOSIS — E039 Hypothyroidism, unspecified: Secondary | ICD-10-CM

## 2020-03-18 DIAGNOSIS — E785 Hyperlipidemia, unspecified: Secondary | ICD-10-CM

## 2020-03-18 DIAGNOSIS — F0281 Dementia in other diseases classified elsewhere with behavioral disturbance: Secondary | ICD-10-CM | POA: Diagnosis not present

## 2020-03-18 DIAGNOSIS — G301 Alzheimer's disease with late onset: Secondary | ICD-10-CM | POA: Diagnosis not present

## 2020-03-19 LAB — LIPID PANEL
Cholesterol: 231 mg/dL — ABNORMAL HIGH (ref <200)
HDL: 56 mg/dL (ref >=50)
LDL Cholesterol (Calc): 150 mg/dL (calc) — ABNORMAL HIGH
Non-HDL Cholesterol (Calc): 175 mg/dL (calc) — ABNORMAL HIGH (ref <130)
Total CHOL/HDL Ratio: 4.1 (calc) (ref <5.0)
Triglycerides: 125 mg/dL (ref <150)

## 2020-03-19 LAB — COMPLETE METABOLIC PANEL WITH GFR
AG Ratio: 1.5 (calc) (ref 1.0–2.5)
ALT: 15 U/L (ref 6–29)
AST: 18 U/L (ref 10–35)
Albumin: 4.2 g/dL (ref 3.6–5.1)
Alkaline phosphatase (APISO): 50 U/L (ref 37–153)
BUN/Creatinine Ratio: 43 (calc) — ABNORMAL HIGH (ref 6–22)
BUN: 29 mg/dL — ABNORMAL HIGH (ref 7–25)
CO2: 27 mmol/L (ref 20–32)
Calcium: 9.8 mg/dL (ref 8.6–10.4)
Chloride: 107 mmol/L (ref 98–110)
Creat: 0.68 mg/dL (ref 0.60–0.88)
GFR, Est African American: 87 mL/min/{1.73_m2} (ref >=60)
GFR, Est Non African American: 75 mL/min/{1.73_m2} (ref >=60)
Globulin: 2.8 g/dL (calc) (ref 1.9–3.7)
Glucose, Bld: 99 mg/dL (ref 65–99)
Potassium: 4.4 mmol/L (ref 3.5–5.3)
Sodium: 144 mmol/L (ref 135–146)
Total Bilirubin: 0.4 mg/dL (ref 0.2–1.2)
Total Protein: 7 g/dL (ref 6.1–8.1)

## 2020-03-19 LAB — CBC WITH DIFFERENTIAL/PLATELET
Absolute Monocytes: 365 cells/uL (ref 200–950)
Basophils Absolute: 41 cells/uL (ref 0–200)
Basophils Relative: 0.7 %
Eosinophils Absolute: 197 cells/uL (ref 15–500)
Eosinophils Relative: 3.4 %
HCT: 37.6 % (ref 35.0–45.0)
Hemoglobin: 12.4 g/dL (ref 11.7–15.5)
Lymphs Abs: 2610 cells/uL (ref 850–3900)
MCH: 32.3 pg (ref 27.0–33.0)
MCHC: 33 g/dL (ref 32.0–36.0)
MCV: 97.9 fL (ref 80.0–100.0)
MPV: 11 fL (ref 7.5–12.5)
Monocytes Relative: 6.3 %
Neutro Abs: 2587 cells/uL (ref 1500–7800)
Neutrophils Relative %: 44.6 %
Platelets: 177 10*3/uL (ref 140–400)
RBC: 3.84 10*6/uL (ref 3.80–5.10)
RDW: 12.1 % (ref 11.0–15.0)
Total Lymphocyte: 45 %
WBC: 5.8 10*3/uL (ref 3.8–10.8)

## 2020-03-19 LAB — TSH: TSH: 2.34 mIU/L (ref 0.40–4.50)

## 2020-03-20 NOTE — Progress Notes (Signed)
Provider:   FNP-C   Gayland Curry, DO  Patient Care Team: Gayland Curry, DO as PCP - General (Geriatric Medicine) [name], MD as Consulting Physician (Gastroenterology) Stanford Breed Denice Bors, MD as Consulting Physician (Cardiology) Druscilla Brownie, MD as Consulting Physician (Dermatology) [name], MD as Consulting Physician (Ophthalmology) Saporito, Maree Erie, LCSW as [place] Management  Extended Emergency Contact Information Primary Emergency Contact: Causey,Edna Address: [place] [address] [name] of [employer] Phone: [phone] Relation: Friend Secondary Emergency Contact: [place], [place] Phone: [phone] Relation: Sister  Code Status:Full code  Goals of care: Advanced Directive information Advanced Directives 03/11/2020  Does Patient Have a Medical Advance Directive? Yes  Type of Advance Directive Out of facility DNR (pink MOST or yellow form);Living will;Healthcare Power of Attorney  Does patient want to make changes to medical advance directive? No - Patient declined  Copy of [place] in Chart? Yes - validated most recent copy scanned in chart (See row information)  Pre-existing out of facility DNR order (yellow form or pink MOST form) -     Chief Complaint  Patient presents with   Medical Management of Chronic Issues    3 Month Follow Up.    HPI:  Pt is a 86 y.o. female seen today for 3 Month follow up for medical management of chronic diseases.She is here with her POA  Nephew [name] [name].she complains of painful callus on her feet affecting her walking.Nephew states plans to transfer patient to [employer] [facility] just waiting for [place] [employer] for approval of 21 days coverage.FL2 filled and signed on previous visit.she continues to require assistance with her activity of daily living.   Hypothyroidism -  takes her levothyroxine 50 mcg tablet daily on empty stomach.she states no cold/heat intolerance.No recent TSH level for review. Hyperlipidemia - on Omega-3 fatty acids 1200 mg three times daily.Not a candidate for statin due to her advance age.latest LDL 154,chol 265 (07/01/2017).   Osteoarthritis - worst on knees limiting her walking.on Tylenol 500 mg tablet three times daily and Celebrex 100 mg tablet twice daily.   Osteoporosis - Reviewed latest T-score left femur Neck -2.5 ( 10/09/2019) previous T-score -2.5 ( 05/21/2017). On alendronate 70 mg tablet weekly and calcium-vit D 2 tablets daily.  Dementia - requires assistance with ADL's plans to transfer to high level of care at [facility] facility.On Seroquel 25 mg tablet daily at bedtime,cymbalta 60 mg capsule at bedtime   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[place])    Obesity    Oral aphthae    Osteoarthrosis, unspecified whether generalized or localized, unspecified site    Other abnormal blood chemistry    Other voice and resonance disorders  Pain in joint, pelvic region and thigh    Palpitations    Pancreatitis    Peripheral vascular disease, unspecified (HCC)    Schatzki's ring    Senile osteoporosis    Torus fracture of fibula    Unspecified constipation    Unspecified hypothyroidism    Past Surgical History:  Procedure Laterality Date   APPENDECTOMY     BASAL  CELL CARCINOMA EXCISION     right humerus, right temple, left side of nose   BASAL CELL CARCINOMA EXCISION     chest, left cheek   CATARACT EXTRACTION, BILATERAL     CHOLECYSTECTOMY     keratosis     left arm   resection of ampullary tumor     squamous cell cancer     Right leg   squamous cell cancer left triceps Left    Dr. Lorenza Cambridge   TONSILLECTOMY AND ADENOIDECTOMY      Allergies  Allergen Reactions   Penicillins Other (See Comments)    Exact reaction not recalled- has not been taken "in a long time" Has patient had a PCN reaction causing immediate rash, facial/tongue/throat swelling, SOB or lightheadedness with hypotension: yes Has patient had a PCN reaction causing severe rash involving mucus membranes or skin necrosis: no Has patient had a PCN reaction that required hospitalization: no Has patient had a PCN reaction occurring within the last 10 years: no If all of the above answers are "NO", then may proceed with Cephalosporin    Codeine Nausea And Vomiting   Lodine [Etodolac] Nausea And Vomiting   Wild Lettuce [Lactuca Virosa] Diarrhea    Allergies as of 03/11/2020      Reactions   Penicillins Other (See Comments)   Exact reaction not recalled- has not been taken "in a long time" Has patient had a PCN reaction causing immediate rash, facial/tongue/throat swelling, SOB or lightheadedness with hypotension: yes Has patient had a PCN reaction causing severe rash involving mucus membranes or skin necrosis: no Has patient had a PCN reaction that required hospitalization: no Has patient had a PCN reaction occurring within the last 10 years: no If all of the above answers are "NO", then may proceed with Cephalosporin    Codeine Nausea And Vomiting   Lodine [etodolac] Nausea And Vomiting   Wild Lettuce [lactuca Virosa] Diarrhea      Medication List       Accurate as of March 11, 2020 11:59 PM. If you have any questions, ask your nurse or doctor.         acetaminophen 500 MG tablet Commonly known as: TYLENOL Take 500 mg by mouth in the morning, at noon, and at bedtime.   alendronate 70 MG tablet Commonly known as: FOSAMAX Take 70 mg by mouth every Saturday. Take with a full glass of water on an empty stomach.   aspirin EC 81 MG tablet Take 81 mg by mouth daily.   bimatoprost 0.01 % Soln Commonly known as: LUMIGAN Place 1 drop into both eyes at bedtime.   CALCIUM 600+D3 PO Take 2 tablets by mouth every evening.   celecoxib 100 MG capsule Commonly known as: CELEBREX Take 100 mg by mouth 2 (two) times daily. One in the morning, and one in the afternoon. What changed: Another medication with the same name was removed. Continue taking this medication, and follow the directions you see here. Changed by: Sandrea Hughs, NP   DULoxetine 60 MG capsule Commonly known as: CYMBALTA Take 60 mg by mouth  at bedtime.   FIBER CHOICE PO Take 5 g by mouth daily as needed (to improve digestive health- mix and drink).   Fish Oil 1200 MG Caps Take 1,200 mg by mouth 3 (three) times daily.   fluticasone 50 MCG/ACT nasal spray Commonly known as: FLONASE Place 1 spray into both nostrils daily as needed for allergies or rhinitis.   gabapentin 300 MG capsule Commonly known as: NEURONTIN Take 300 mg by mouth at bedtime.   levothyroxine 50 MCG tablet Commonly known as: SYNTHROID Take 50 mcg by mouth daily before breakfast.   multivitamin with minerals Tabs tablet Take 1 tablet by mouth daily with lunch.   nystatin powder Commonly known as: MYCOSTATIN/NYSTOP Apply 1 application topically in the morning, at noon, in the evening, and at bedtime. Under Breast.   omeprazole 20 MG capsule Commonly known as: PRILOSEC Take 20 mg by mouth daily before breakfast.   QUEtiapine 25 MG tablet Commonly known as: SEROQUEL Take 25 mg by mouth at bedtime.   SYSTANE ULTRA OP Place 1 drop into both eyes 3 (three) times daily as needed (dry eyes).    Vitamin D3 50 MCG (2000 UT) capsule Take 2,000 Units by mouth daily.       Review of Systems  Unable to perform ROS: Dementia (Additional information provided by patient's [place] present duing visit.)  Constitutional: Negative for appetite change, chills, fatigue and fever.  HENT: Negative for congestion, rhinorrhea, sinus pressure, sinus pain, sneezing, sore throat, tinnitus and trouble swallowing.   Eyes: Negative for discharge, redness and itching.  Respiratory: Negative for cough, chest tightness, shortness of breath and wheezing.   Cardiovascular: Negative for chest pain, palpitations and leg swelling.  Gastrointestinal: Negative for abdominal distention, abdominal pain, diarrhea, nausea and vomiting.       Current medication effective for constipation.   Endocrine: Negative for cold intolerance, heat intolerance, polydipsia, polyphagia and polyuria.  Genitourinary: Negative for difficulty urinating, dysuria, flank pain, frequency and urgency.  Musculoskeletal: Positive for arthralgias and gait problem. Negative for joint swelling, myalgias, neck pain and neck stiffness.  Skin: Negative for color change, pallor, rash and wound.  Neurological: Negative for dizziness, speech difficulty, weakness, light-headedness, numbness and headaches.  Hematological: Does not bruise/bleed easily.  Psychiatric/Behavioral: Positive for confusion and hallucinations. Negative for agitation, behavioral problems and sleep disturbance. The patient is not nervous/anxious.     Immunization History  Administered Date(s) Administered   Fluad Quad(high Dose 65+) 06/04/2019   Influenza, High Dose Seasonal PF 05/09/2017, 06/19/2018   Influenza,inj,Quad PF,6+ Mos 06/17/2013, 06/23/2014, 09/07/2015, 07/04/2016   Influenza-Unspecified 07/09/2012   PFIZER SARS-COV-2 Vaccination 11/02/2019, 11/23/2019   Pneumococcal Conjugate-13 07/11/2018   Pneumococcal Polysaccharide-23 07/04/2016    Pneumococcal-Unspecified 09/10/1992   Tdap 09/11/2007, 07/11/2018   Zoster 11/28/2010   Zoster Recombinat (Shingrix) 07/09/2017, 10/23/2017   Pertinent  Health Maintenance Due  Topic Date Due   INFLUENZA VACCINE  04/10/2020   DEXA SCAN  Completed   PNA vac Low Risk Adult  Completed   Fall Risk  03/11/2020 03/01/2020 02/23/2020 11/04/2019 07/13/2019  Falls in the past year? 0 1 1 0 0  Number falls in past yr: 0 1 1 0 0  Injury with Fall? 0 1 0 0 0  Comment - - - - -  Risk Factor Category  - - - - -  Risk for fall due to : - History of fall(s);Impaired balance/gait;Impaired mobility;Mental status change;Orthopedic patient - - -  Follow up - Education provided;Falls prevention discussed - - -  Vitals:   03/11/20 1038  BP: 120/70  Resp: 16  Temp: (!) 97.3 F (36.3 C)  Weight: 158 lb 9.6 oz (71.9 kg)  Height: 5' 3" (1.6 m)   Body mass index is 28.09 kg/m. Physical Exam Constitutional:      General: She is not in acute distress.    Appearance: She is overweight. She is not ill-appearing.  HENT:     Head: Normocephalic.     Right Ear: Tympanic membrane, ear canal and external ear normal. There is no impacted cerumen.     Left Ear: Tympanic membrane, ear canal and external ear normal. There is no impacted cerumen.     Nose: Nose normal. No congestion or rhinorrhea.     Mouth/Throat:     Mouth: Mucous membranes are moist.     Pharynx: Oropharynx is clear. No oropharyngeal exudate or posterior oropharyngeal erythema.  Eyes:     General: No scleral icterus.       Right eye: No discharge.        Left eye: No discharge.     Extraocular Movements: Extraocular movements intact.     Conjunctiva/sclera: Conjunctivae normal.     Pupils: Pupils are equal, round, and reactive to light.  Neck:     Vascular: No carotid bruit.  Cardiovascular:     Rate and Rhythm: Normal rate and regular rhythm.     Pulses: Normal pulses.     Heart sounds: Normal heart sounds. No murmur heard.  No  friction rub. No gallop.   Pulmonary:     Effort: Pulmonary effort is normal. No respiratory distress.     Breath sounds: Normal breath sounds. No wheezing, rhonchi or rales.  Chest:     Chest wall: No tenderness.  Abdominal:     General: Bowel sounds are normal. There is no distension.     Palpations: Abdomen is soft. There is no mass.     Tenderness: There is no abdominal tenderness. There is no right CVA tenderness, left CVA tenderness, guarding or rebound.  Musculoskeletal:        General: No swelling or tenderness.     Cervical back: Normal range of motion. No rigidity or tenderness.     Right lower leg: No edema.     Left lower leg: No edema.     Comments: Unsteady gait  Feet:     Right foot:     Skin integrity: Callus present. No skin breakdown, erythema or warmth.     Toenail Condition: Right toenails are long.     Left foot:     Skin integrity: Callus present. No skin breakdown, erythema or warmth.     Toenail Condition: Left toenails are long.  Lymphadenopathy:     Cervical: No cervical adenopathy.  Skin:    General: Skin is warm.     Coloration: Skin is not pale.     Findings: No bruising, erythema or rash.  Neurological:     Mental Status: She is alert. Mental status is at baseline.     Cranial Nerves: No cranial nerve deficit.     Sensory: No sensory deficit.     Motor: No weakness.     Coordination: Coordination normal.     Gait: Gait abnormal.  Psychiatric:        Mood and Affect: Mood normal.        Speech: Speech normal.        Behavior: Behavior normal. Behavior is cooperative.  Thought Content: Thought content normal.        Cognition and Memory: Cognition is impaired. Memory is impaired.        Judgment: Judgment normal.    Labs reviewed: Recent Labs    11/10/19 1205 02/19/20 1413 03/18/20 1032  NA 143 140 144  K 4.2 4.1 4.4  CL 105 105 107  CO2 _0 GLUCOSE 98 105* 99  BUN 18 19 29*  CREATININE 0.80 0.73 0.68  CALCIUM 9.3 9.3  9.8   Recent Labs    11/10/19 1205 02/19/20 1413 03/18/20 1032  AST 23 40 18  ALT 22 42 15  ALKPHOS  --  42  --   BILITOT 0.6 0.6 0.4  PROT 6.9 7.3 7.0  ALBUMIN  --  4.0  --    Recent Labs    06/29/19 1200 06/29/19 1200 11/10/19 1205 02/19/20 1413 03/18/20 1032  WBC 5.7   < > 7.0 5.8 5.8  NEUTROABS 3,164  --  4,774  --  2,587  HGB 11.9   < > 12.6 12.2 12.4  HCT 36.1   < > 38.3 38.5 37.6  MCV 98.6   < > 98.0 100.8* 97.9  PLT 167   < > 207 159 177   < > = values in this interval not displayed.   Lab Results  Component Value Date   TSH 2.34 03/18/2020   Lab Results  Component Value Date   HGBA1C 5.3 07/01/2017   Lab Results  Component Value Date   CHOL 231 (H) 03/18/2020   HDL 56 03/18/2020   LDLCALC 150 (H) 03/18/2020   LDLDIRECT 113.2 10/18/2006   TRIG 125 03/18/2020   CHOLHDL 4.1 03/18/2020    Significant Diagnostic Results in last 30 days:  No results found.  Assessment/Plan 1. Callus of foot Reports pain with walking.Will refer to Podiatrist for further evaluation.  - Ambulatory referral to Podiatry  2. Hypothyroidism, unspecified type -continue on Levothyroxine 50 mcg tablet daily. - TSH; Future  3. Hyperlipidemia, unspecified hyperlipidemia type Continue on Omega-3 fatty acids 1200 mg three times daily - Lipid panel; Future  4. Primary osteoarthritis of left knee Continue on Tylenol 500 mg tablet three times daily and Celebrex 100 mg tablet twice daily.May benefit from Physical Therapy at the facility for ROM,exercise and muscle strengthening.   5. Late onset Alzheimer's disease with behavioral disturbance ([place]) Awaiting to transfer to higher level of [facility] at [facility].Progressive decline expected.continue with supportive care.  - CBC with Differential/Platelet; Future - CMP with eGFR([facility]); Future  6. Senile osteoporosis Latest T-score stable.continue on alendronate 70 mg tablet weekly and calcium-vit D 2 tablets daily.  Family/  staff Communication: Reviewed plan of care with patient and Nephew verbalized understanding.   Labs/tests ordered:  - CBC with Differential/Platelet; Future - CMP with eGFR([facility]); Future - TSH; Future - Lipid panel; Future  Next Appointment : 6 months for medical management of chronic issues.labs in 1 week or sooner.   Sandrea Hughs, NP

## 2020-03-21 ENCOUNTER — Other Ambulatory Visit: Payer: Self-pay

## 2020-03-21 DIAGNOSIS — E039 Hypothyroidism, unspecified: Secondary | ICD-10-CM

## 2020-03-21 DIAGNOSIS — E785 Hyperlipidemia, unspecified: Secondary | ICD-10-CM

## 2020-03-21 DIAGNOSIS — I10 Essential (primary) hypertension: Secondary | ICD-10-CM

## 2020-03-23 ENCOUNTER — Other Ambulatory Visit: Payer: Self-pay | Admitting: *Deleted

## 2020-03-23 ENCOUNTER — Other Ambulatory Visit: Payer: Self-pay

## 2020-03-23 MED ORDER — CELECOXIB 100 MG PO CAPS: 100.0000 mg | ORAL_CAPSULE | Freq: Two times a day (BID) | ORAL | 1 refills | Status: DC

## 2020-03-23 NOTE — Telephone Encounter (Signed)
Warning popped up when trying to fill medication.

## 2020-03-23 NOTE — Patient Outreach (Signed)
[facility]) Care Management  03/23/2020  Tonya Russell 20-Jun-1925 [id number]   CSW was only able to make brief contact with patient's nephew and primary caregiver, [name] today, to follow-up regarding placement arrangements for patient.  Mr. [name] apologized for not staying in constant communication with CSW, admitting that he has been very preoccupied with caring for his mother, who is currently hospitalized.  CSW voiced understanding, explaining to Mr. [name] that there is no need for apologies.  CSW went on to explain to Mr. [name] that patient's original FL-2 Form has now expired, requiring CSW to complete a new one.  CSW requested that Mr. [name] fax, or email, CSW a copy of patient's original FL-2 Form so that CSW can duplicate all the information on the new FL-2 Form.  CSW will then fax the FL-2 Form to patient's Primary Care Physician, Dr. Hollace Kinnier, for review and signature.  CSW provide Mr. [name] with CSW's fax number, as well as email address.  CSW also wanted to confirm with Mr. [name] that he is still wanting to pursue skilled nursing placement for patient, to receive short-term rehabilitative services.  Mr. [name] admitted that he was "unsure", needing to give it some additional thought.  In the meantime, CSW agreed to fax patient's new FL-2 Form to [place], [place] of interest, once received from Dr. Mariea Clonts, to see if they are willing to make a bed offer on patient.  Mr. [name] was agreeable to this plan.  If Mr. [name] does decide to only pursue long-term care assisted living facilities, CSW requested that he provide CSW with a list of assisted living facilities of interest to him and patient.  CSW has already provided Mr. [name] with a complete list of assisted living facilities, as well as skilled nursing facilities.  CSW reminded Mr. [name] that he will need to apply for [place] Medicaid,  at the [place], in the county in which patient will reside, if he decides to only pursue assisted living placement.  Mr. [name] admitted that he does not know when he will be able to get around to performing all of these tasks, but will certainly make an effort within the next week.  CSW explained to Mr. [name] that [place] will be out-of-the-office next week, but will request that CSW's colleague follow-up with him to check the status of all the above named tasks.  Otherwise, CSW will make arrangements to follow-up with Mr. [name] upon CSW's return, on Monday, April 04, 2020, around 10:00am.  Mr. [name] has been encouraged to contact CSW directly, if he has questions or needs additional assistance in the meantime, confirming CSW's contact information.  [name], BSW, MSW, LCSW  Licensed Education officer, [facility]  Mailing [place] [place], [place] [address] Physical Address-[address], [place], [place] [address] Toll Free Main # [phone] Fax # [phone] Cell # [phone]  Office # [phone] [email or web address]

## 2020-03-30 ENCOUNTER — Ambulatory Visit (INDEPENDENT_AMBULATORY_CARE_PROVIDER_SITE_OTHER): Payer: Medicare Other | Admitting: Podiatry

## 2020-03-30 ENCOUNTER — Other Ambulatory Visit: Payer: Self-pay

## 2020-03-30 DIAGNOSIS — Q828 Other specified congenital malformations of skin: Secondary | ICD-10-CM | POA: Diagnosis not present

## 2020-03-30 DIAGNOSIS — M7752 Other enthesopathy of left foot: Secondary | ICD-10-CM

## 2020-04-01 ENCOUNTER — Encounter: Payer: Self-pay | Admitting: Podiatry

## 2020-04-01 NOTE — Progress Notes (Signed)
Subjective:  Patient ID: Tonya Russell, female    DOB: [birth year],  MRN: [id number]  Chief Complaint  Patient presents with   Callouses    pt is here for a callus of the left plantar forefoot    86 y.o. female presents with the above complaint.  Patient presents with a complaint of left submetatarsal 1 hyperkeratotic lesion/benign skin lesion that has been very painful to walk on.  Patient states been going on for quite some time has progressive gotten worse.  Patient states is painful to the forefoot when ambulating on it.  She states that she has not really done anything for it.  She is states that she would like to discuss various treatment options.  She denies any other acute complaints.  She has not seen anyone else prior to seeing me.  She would like to have it debrided down.   Review of Systems: Negative except as noted in the HPI. Denies N/V/F/Ch.  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)    Obesity    Oral aphthae    Osteoarthrosis, unspecified whether generalized or localized, unspecified site    Other abnormal blood chemistry    Other voice and resonance disorders    Pain in joint, pelvic region and thigh    Palpitations    Pancreatitis    Peripheral vascular disease, unspecified (HCC)    Schatzki's ring    Senile osteoporosis    Torus fracture of fibula    Unspecified constipation    Unspecified  hypothyroidism     Current Outpatient Medications:    acetaminophen (TYLENOL) 500 MG tablet, Take 500 mg by mouth in the morning, at noon, and at bedtime., Disp: , Rfl:    alendronate (FOSAMAX) 70 MG tablet, Take 70 mg by mouth every Saturday. Take with a full glass of water on an empty stomach., Disp: , Rfl:    aspirin EC 81 MG tablet, Take 81 mg by mouth daily., Disp: , Rfl:    bimatoprost (LUMIGAN) 0.01 % SOLN, Place 1 drop into both eyes at bedtime., Disp: , Rfl:    Calcium Carb-Cholecalciferol (CALCIUM 600+D3 PO), Take 2 tablets by mouth every evening. , Disp: , Rfl:    celecoxib (CELEBREX) 100 MG capsule, Take 1 capsule (100 mg total) by mouth 2 (two) times daily. One in the morning, and one in the afternoon., Disp: 180 capsule, Rfl: 1   Cholecalciferol (VITAMIN D3) 50 MCG (2000 UT) capsule, Take 2,000 Units by mouth daily., Disp: , Rfl:    DULoxetine (CYMBALTA) 60 MG capsule, Take 60 mg by mouth at bedtime., Disp: , Rfl:    fluticasone (FLONASE) 50 MCG/ACT nasal spray, Place 1 spray into both nostrils daily as needed for allergies or rhinitis. , Disp: , Rfl:    gabapentin (NEURONTIN) 300 MG capsule, Take 300 mg by mouth at bedtime., Disp: , Rfl:    Inulin (FIBER CHOICE  PO), Take 5 g by mouth daily as needed (to improve digestive health- mix and drink). , Disp: , Rfl:    levothyroxine (SYNTHROID) 50 MCG tablet, Take 50 mcg by mouth daily before breakfast., Disp: , Rfl:    Multiple Vitamin (MULTIVITAMIN WITH MINERALS) TABS tablet, Take 1 tablet by mouth daily with lunch. , Disp: , Rfl:    nystatin (MYCOSTATIN/NYSTOP) powder, Apply 1 application topically in the morning, at noon, in the evening, and at bedtime. Under Breast., Disp: 15 g, Rfl: 1   Omega-3 Fatty Acids (FISH OIL) 1200 MG CAPS, Take 1,200 mg by mouth 3 (three) times daily. , Disp: , Rfl:    omeprazole (PRILOSEC) 20 MG capsule, Take 20 mg by mouth daily before breakfast., Disp: , Rfl:    Polyethyl Glycol-Propyl  Glycol (SYSTANE ULTRA OP), Place 1 drop into both eyes 3 (three) times daily as needed (dry eyes). , Disp: , Rfl:    QUEtiapine (SEROQUEL) 25 MG tablet, Take 25 mg by mouth at bedtime., Disp: , Rfl:   Social History   Tobacco Use  Smoking Status Never Smoker  Smokeless Tobacco Never Used    Allergies  Allergen Reactions   Penicillins Other (See Comments)    Exact reaction not recalled- has not been taken "in a long time" Has patient had a PCN reaction causing immediate rash, facial/tongue/throat swelling, SOB or lightheadedness with hypotension: yes Has patient had a PCN reaction causing severe rash involving mucus membranes or skin necrosis: no Has patient had a PCN reaction that required hospitalization: no Has patient had a PCN reaction occurring within the last 10 years: no If all of the above answers are "NO", then may proceed with Cephalosporin    Codeine Nausea And Vomiting   Lodine [Etodolac] Nausea And Vomiting   Wild Lettuce [Lactuca Virosa] Diarrhea   Objective:  There were no vitals filed for this visit. There is no height or weight on file to calculate BMI. Constitutional Well developed. Well nourished.  Vascular Dorsalis pedis pulses palpable bilaterally. Posterior tibial pulses palpable bilaterally. Capillary refill normal to all digits.  No cyanosis or clubbing noted. Pedal hair growth normal.  Neurologic Normal speech. Oriented to person, place, and time. Epicritic sensation to light touch grossly present bilaterally.  Dermatologic  hyperkeratotic lesion/benign skin lesion noted to left submetatarsal 1.  Pain on palpation to the lesion.  No pinpoint bleeding noted with debridement.  Central nucleated core noted.  Pain on palpation to the first metatarsophalangeal joint.  Pain with range of motion of the joint.  No intra-articular pain noted.  Orthopedic: Normal joint ROM without pain or crepitus bilaterally. No visible deformities. No bony tenderness.    Radiographs: None Assessment:   1. Porokeratosis   2. Capsulitis of metatarsophalangeal (MTP) joint of left foot    Plan:  Patient was evaluated and treated and all questions answered.  Left submetatarsal 1 porokeratosis with underlying capsulitis. -I explained to the patient the etiology of porokeratosis and various treatment options were extensively discussed.  I believe patient will benefit from a steroid injection to help decrease the acute inflammatory component associated with pain.  Patient agrees with the plan would like to proceed with a steroid injection to decrease inflammation. -A steroid injection was performed at left first MPJ using 1% plain Lidocaine and 10 mg of Kenalog. This was well tolerated. -Using chisel blade and a handle, the lesion was aggressively debrided down to healthy striated tissue.  The central nucleated core was excised out.  No pinpoint bleeding noted.  No complication noted.   No follow-ups on file.

## 2020-04-04 ENCOUNTER — Other Ambulatory Visit: Payer: Self-pay | Admitting: *Deleted

## 2020-04-04 NOTE — Patient Outreach (Signed)
[facility]) Care Management  04/04/2020  Tonya Russell Apr 24, 1925 [id number]   CSW was able to make contact with patient's nephew, [name] today to follow-up regarding placement arrangements for patient.  Mr. [name] reported that he now wants for CSW to assist him with pursing long-term care placement arrangements for patient, into an assisted living facility that offers memory care services and has the wander guard system.  CSW voiced understanding, agreeing to complete a new FL-2 Form, based on Mr. [name]'s preference regarding level of care, and submit to patient's Primary Care Physician, Dr. Hollace Kinnier, for review and signature.  Mr. [name] indicated that he would really like for CSW to fax patient's new FL-2 Form to [place], which CSW has agreed to do, once the new FL-2 Form has been faxed back to CSW from Dr. Mariea Clonts.  CSW encouraged Mr. [name] to go ahead and contact a representative with the [place] to obtain assistance with completing a [place] Medicaid application for patient.  CSW agreed to follow-up with Mr. [name] again next week, on Monday, April 11, 2020, around 9:00am, to report findings.  [name], BSW, MSW, LCSW  Licensed Education officer, environmental Health System  Mailing [place] [address], [place], [place] [address] Physical Address-[address], [place], [place] [address] Toll Free Main # [phone] Fax # [phone] Cell # [phone]  Office # [phone] [email or web address]

## 2020-04-11 ENCOUNTER — Other Ambulatory Visit: Payer: Self-pay | Admitting: *Deleted

## 2020-04-11 NOTE — Patient Outreach (Signed)
[place]) Care Management  04/11/2020  Tonya Russell 03/26/1925 [id number]   CSW was able to make contact with patient's nephew, [name] today to follow-up regarding long-term care placement arrangements for patient, into an assisted living facility with memory care services and the wander guard system.  CSW explained to Mr. [name] that CSW is just awaiting the completed and signed FL-2 Form from patient's Primary Care Physician, Dr. Hollace Kinnier, and once received, CSW will fax to the admissions department at [facility], per Mr. [name]'s request.  Mr. [name] admitted to meeting with an elder care attorney, obtaining good advice on how to liquidate patient's assets and other legal matters.  Mr. [name] went on to say that the visit went well and that he was able to obtain a lot of good insight.  CSW inquired as to whether or not patient would be private pay at the assisted living facility initially, as CSW is aware that patient may be required to spend down some of her assets before she is eligible to qualify for [place] Medicaid, through the [place].  Mr. [name] admitted that he is unsure of patient's payor source at the assisted living facility, still trying to iron out all of the details with the attorney.  CSW voiced understanding, explaining to Mr. [name] that [facility] will place him as the primary contact for patient, on patient's FL-2 Form, so that the admissions department can contact him directly regarding finances.  CSW agreed to follow-up with Mr. [name] again next week, on Monday, April 18, 2020, around 10:00am, unless a bed offer is received from [facility], in the meantime.    [name], BSW, MSW, LCSW  Licensed Education officer, environmental Health System   Mailing [place] [address], [place], [place] [address] Physical Address-[address], [place], [place] [address] Toll Free Main # [phone] Fax # [phone] Cell # [phone]  Office # [phone] [email or web address]

## 2020-04-18 ENCOUNTER — Other Ambulatory Visit: Payer: Self-pay | Admitting: *Deleted

## 2020-04-18 NOTE — Patient Outreach (Signed)
[facility]) Care Management  04/18/2020  Kristyna Bradstreet [birth year] [id number]  CSW was able to make contact with patient's nephew and primary caregiver, [name] today to follow-up regarding long-term care placement arrangements for patient.  It has been decided, by Mr. [name], that patient will be placed into an assisted living facility with memory care services and the wander guard system.  CSW explained to Mr. [name] that CSW received patient's signed and completed FL-2 Form on Friday, April 15, 2020, from patient's Primary Care Physician, Dr. Hollace Kinnier.  CSW went on to explain to Mr. [name] that the form has been faxed to the admissions department at [facility], per Mr. [name]'s request.  CSW is just awaiting a bed offer.  Mr. [name] voiced understanding, indicating that he is in the process of completing patient's [place] Medicaid application, with his elder law attorney, and that they will be submitting patient's application to the [place] for processing, once completed.  Mr. [name] was unable to provide CSW with a specific time-frame for submission.  CSW explained to Mr. [name] that [facility] will encourage the admissions department at [facility] to contact him directly if they have additional questions regarding patient's payor source, as Mr. [name] was unable to clarify whether or not patient will be private pay initially.  CSW agreed to follow-up with Mr. [name] again next week, on Monday, April 25, 2020, around 10:00am.  In the meantime, CSW will perform a thorough review of patient's EMR (Electronic Medical Record) in Epic to verify whether or not patient has received a Chest X-Ray within the last year, or had a TB Skin Test administered within the last 6 months, as patient will need to prove that she is  negative for Tuberculosis before being admitted into any assisted living facility.  Patient will also need to provide proof that she is negative for COVID-19, at least 72 hours prior to admission into an assisted living facility.  Mr. [name] is aware of these requirements, indicating that he will make arrangements to have patient tested once a bed offer is received.  [name], BSW, MSW, LCSW  Licensed Education officer, environmental Health System  Mailing [place] [address] [place], [place] [address] Physical Address-[address], [place], [place] [address] Toll Free Main # [phone] Fax # [phone] Cell # [phone]  Office # [phone] [email or web address]

## 2020-04-25 ENCOUNTER — Other Ambulatory Visit: Payer: Self-pay | Admitting: *Deleted

## 2020-04-25 NOTE — Patient Outreach (Signed)
[facility]) Care Management  04/25/2020  Catricia Scheerer 1924-12-28 [id number]   CSW was able to make contact with patient's nephew, [name] today to follow-up regarding social work services and resources for patient, as well as to check the status of long-term care placement arrangements for patient into an assisted living facility, offering memory care services and the wander guard system.  Mr. [name] admitted that he has nothing new to report, "still working with the elder law attorney regarding patient's finances, still working on completing the [place] Medicaid application to submit to the [place] for processing, and still touring various facilities of interest in an attempt to try and pursue bed offers.  CSW voiced understanding, agreeing to follow-up with Mr. [name] again next week, on Thursday, May 05, 2020, around 9:00am.  Mr. [name] was agreeable to this plan, indicating that he hopes to have some progress to report by then.  [name], BSW, MSW, LCSW  Licensed Education officer, environmental Health System  Mailing [place] [address], [place], [place] [address] Physical Address-[address], [place], [place] [address] Toll Free Main # [phone] Fax # [phone] Cell # [phone]  Office # [phone] [email or web address]

## 2020-04-26 DIAGNOSIS — H401134 Primary open-angle glaucoma, bilateral, indeterminate stage: Secondary | ICD-10-CM | POA: Diagnosis not present

## 2020-04-26 DIAGNOSIS — H04123 Dry eye syndrome of bilateral lacrimal glands: Secondary | ICD-10-CM | POA: Diagnosis not present

## 2020-04-26 DIAGNOSIS — H18593 Other hereditary corneal dystrophies, bilateral: Secondary | ICD-10-CM | POA: Diagnosis not present

## 2020-05-05 ENCOUNTER — Other Ambulatory Visit: Payer: Self-pay | Admitting: *Deleted

## 2020-05-05 NOTE — Patient Outreach (Signed)
[facility]) Care Management  05/05/2020  Tonya Russell 03-23-1925 [id number]   CSW was able to make contact with patient's nephew and primary caregiver, [name] today, to follow-up regarding social work services and resources for patient.  CSW was also able to check the status of long-term care placement arrangements for patient into an assisted living facility, offering memory care services and the wander guard system.  Mr. [name] admitted that there is no new progress to report.  Mr. [name] went on to say that he has temporarily put placement arrangements for patient on hold, due to the recent mandatory shut down of all assisted living facilities, in response to the new COVID-19 V[place] outbreak.    CSW voiced understanding, agreeing to follow-up with Mr. [name] every two weeks, as opposed to every week, at least until he has submitted patient's [name] Medicaid application to the [place] for processing.  Mr. [name] is aware that it can take 45-60 days to process the application before he will receive an approval/denial letter in the mail.  CSW encouraged Mr. [name] to try and submit the application as soon as possible, especially if he would like for patient to be placed into an assisted living facility within the next several months, offering to assist with the application process.  [name], BSW, MSW, LCSW  Licensed Education officer, environmental Health System  Mailing [place] [address], [place], [place] [address] Physical Address-[address], [place], [place] [address] Toll Free Main # [phone] Fax # [phone] Cell # [phone]  [email or web address]

## 2020-05-17 ENCOUNTER — Other Ambulatory Visit: Payer: Self-pay | Admitting: *Deleted

## 2020-05-17 ENCOUNTER — Encounter: Payer: Self-pay | Admitting: *Deleted

## 2020-05-17 NOTE — Patient Outreach (Signed)
[facility]) Care Management  05/17/2020  Tonya Russell 11-Jun-1925 [id number]   CSW was able to make contact with patient's nephew and primary caregiver, Tonya Russell today, to follow-up regarding long-term care placement arrangements for patient into an assisted living facility, offering memory care services and the wander guard system.  [name] admitted that he has completely put placement on hold for the time being, due to assisted living facilities shutting down again, as a result of the new COVID-19 [place] Variant.  [name] went on to say that he is still working with an elder law attorney to get patient's affairs in order, which may require several more months.  [name] also stated that he is in the process of trying to sell patient's home, which is extremely time-consuming.    [name] indicated that he believes he has all the information and resources he needs to successfully place patient into an assisted living facility, once he is ready.  [name] reported that he is still waiting to hear back from a representative with the [place], regarding patient's [place] Medicaid application, and whether or not she has been approved.  [name] is aware that it can take 45-60 days to process the application before a determination letter is mailed to patient's home.  [name] admitted that he is still looking at various assisted living facilities, not completely sold on any of the facilities that he has toured thus far.  [name] is aware that he will need to obtain a new FL-2 Form from patient's Primary Care Physician, Dr. Hollace Russell, once he is ready to pursue placement for patient, as the current FL-2 Form expired after 30 days.  [name] indicated that he will plan to pick up a copy of the new FL-2 Form from Dr. Cyndi Russell office, then hand deliver the form to all facilities of interest, while he is already  there touring them for possible placement.  [name] stated that he also plans to have Dr. Cyndi Russell nurse perform a COVID-19 Screening on patient, at least 3 days prior to admission into the assisted living facility, which is now a requirement of all facilities.  Last, [name] is aware that he will need to put patient's case worker, with the [place], in direct contact with the the business office at all facilities of interest, so that they can work a Agricultural consultant on [employer].    CSW will perform a case closure on patient, as all goals of treatment have been met from social work standpoint and no additional social work needs have been identified at this time.  CSW will notify patient's RNCM with [facility], Tonya Russell of CSW's plans to close patient's case.  CSW will fax an update to patient's Primary Care Physician, Dr. Hollace Russell to ensure that she is aware of CSW's involvement with patient's plan of care, as well as send a Physician Case Closure Letter.  CSW was able to confirm that [name] has the correct contact information for CSW, encouraging him to contact CSW directly if additional social work needs arise in the near future.   Tonya Russell, BSW, MSW, LCSW  Licensed Education officer, environmental Health System  Mailing [place] [address], [place], [place] [address] Physical Address-[address] [place], [place] [address] Toll Free Main # [phone] Fax # [phone] Cell #  [phone]  [email or web address]

## 2020-06-11 DIAGNOSIS — Z23 Encounter for immunization: Secondary | ICD-10-CM | POA: Diagnosis not present

## 2020-07-01 ENCOUNTER — Other Ambulatory Visit: Payer: Self-pay

## 2020-07-01 ENCOUNTER — Ambulatory Visit (INDEPENDENT_AMBULATORY_CARE_PROVIDER_SITE_OTHER): Payer: Medicare Other | Admitting: Podiatry

## 2020-07-01 DIAGNOSIS — M79675 Pain in left toe(s): Secondary | ICD-10-CM

## 2020-07-01 DIAGNOSIS — I7300 Raynaud's syndrome without gangrene: Secondary | ICD-10-CM

## 2020-07-01 DIAGNOSIS — B351 Tinea unguium: Secondary | ICD-10-CM

## 2020-07-01 DIAGNOSIS — M79674 Pain in right toe(s): Secondary | ICD-10-CM | POA: Diagnosis not present

## 2020-07-01 DIAGNOSIS — I739 Peripheral vascular disease, unspecified: Secondary | ICD-10-CM | POA: Diagnosis not present

## 2020-07-04 ENCOUNTER — Telehealth: Payer: Self-pay

## 2020-07-04 NOTE — Telephone Encounter (Signed)
Tonya Russell's Nephew call and was concerned because the pharmacy suggest that she take her medication differently for exam instead of taking all three Tramadol, Seroquel, and Gabapentin all at night he would like for her to take the Seroquel and Gabapentin at 6 pm and the tramadol at night. He would just like to know your thoughts before giving her the medication. Basically the times. Please advise.

## 2020-07-04 NOTE — Telephone Encounter (Signed)
If she has done ok taking them together at bedtime, then I would not change that.  I'd like her to avoid being up and about after taking those medications.

## 2020-07-05 ENCOUNTER — Encounter: Payer: Self-pay | Admitting: Podiatry

## 2020-07-05 NOTE — Telephone Encounter (Signed)
Results given to nephew he said thank you.

## 2020-07-05 NOTE — Progress Notes (Signed)
  Subjective:  Patient ID: Tonya Russell, female    DOB: [birth year],  MRN: [id number]  Chief Complaint  Patient presents with  . routine foot care    nail trim    86 y.o. female returns for the above complaint.  Patient presents with thickened elongated dystrophic toenails x10.  Patient states is painful to touch.  She has a hard time ambulating and cutting them down herself.  She would like for me to help them debrided down.  She denies being a diabetic however she does have a mild component of peripheral vascular disease.  She has secondary complaint of bluish discoloration to the toes which appears to be Raynaud's phenomenon without any underlying lupus or any other autoimmune diseases.  Objective:  There were no vitals filed for this visit. Podiatric Exam: Vascular: Decreased dorsalis pedis and posterior tibial pulses are faintly palpable bilateral. Capillary return is sluggish.  Bluish discoloration noted to the distal tip of the toes without any signs of infection/gangrene. Sensorium: Normal Semmes Weinstein monofilament test. Normal tactile sensation bilaterally. Nail Exam: Pt has thick disfigured discolored nails with subungual debris noted bilateral entire nail hallux through fifth toenails.  Pain on palpation to the nails. Ulcer Exam: There is no evidence of ulcer or pre-ulcerative changes or infection. Orthopedic Exam: Muscle tone and strength are WNL. No limitations in general ROM. No crepitus or effusions noted. HAV  B/L.  Hammer toes 2-5  B/L. Skin: No Porokeratosis. No infection or ulcers    Assessment & Plan:   1. Isolated primary Raynaud's phenomenon   2. Pain due to onychomycosis of toenails of both feet   3. Peripheral vascular disease, unspecified ([place])     Patient was evaluated and treated and all questions answered.  Raynaud's phenomenon to bilateral toes without gangrene -I explained to the patient the etiology of Raynaud's phenomenon and various treatment options  were discussed.  I discussed with her that even though she does not have any autoimmune diseases this could be primary Raynaud's phenomenon that can lead to discoloration of the digits especially during weather changes.  Patient states understanding.  At this time will consider monitoring it and if there is any acute signs of gangrene or any other vascular compromise we will discuss treatment options at that time.  Patient states understanding  Onychomycosis with pain  -Nails palliatively debrided as below. -Educated on self-care  Procedure: Nail Debridement Rationale: pain  Type of Debridement: manual, sharp debridement. Instrumentation: Nail nipper, rotary burr. Number of Nails: 10  Procedures and Treatment: Consent by patient was obtained for treatment procedures. The patient understood the discussion of treatment and procedures well. All questions were answered thoroughly reviewed. Debridement of mycotic and hypertrophic toenails, 1 through 5 bilateral and clearing of subungual debris. No ulceration, no infection noted.  Return Visit-Office Procedure: Patient instructed to return to the office for a follow up visit 3 months for continued evaluation and treatment.  Boneta Lucks, DPM    No follow-ups on file.

## 2020-07-07 ENCOUNTER — Other Ambulatory Visit: Payer: Self-pay | Admitting: Internal Medicine

## 2020-07-07 NOTE — Telephone Encounter (Signed)
rx sent to pharmacy by e-script  

## 2020-07-15 ENCOUNTER — Encounter: Payer: Self-pay | Admitting: Family

## 2020-07-15 ENCOUNTER — Ambulatory Visit (INDEPENDENT_AMBULATORY_CARE_PROVIDER_SITE_OTHER): Payer: Medicare Other | Admitting: Family

## 2020-07-15 ENCOUNTER — Other Ambulatory Visit: Payer: Self-pay

## 2020-07-15 DIAGNOSIS — Z0000 Encounter for general adult medical examination without abnormal findings: Secondary | ICD-10-CM | POA: Diagnosis not present

## 2020-07-15 NOTE — Progress Notes (Signed)
°  °  This service is provided via telemedicine  No vital signs collected/recorded due to the encounter was a telemedicine visit.   Location of patient (ex: home, work): Home.  Patient consents to a telephone visit: Yes.  Location of the provider (ex: office, home):  [facility].  Name of any referring provider: Gayland Curry, DO   Names of all persons participating in the telemedicine service and their role in the encounter:  Patient, Tonya Russell/Nephew, Heriberto [place], [place], [place], [place], NP.    Time spent on call: 22 minutes spent on the phone with Medical Assistant.

## 2020-07-15 NOTE — Progress Notes (Signed)
Subjective:   Tonya Russell is a 86 y.o. female who presents for Medicare Annual (Subsequent) preventive examination.  Review of Systems     Cardiac Risk Factors include: advanced age (>50men, >61 women);dyslipidemia     Objective:    Today's Vitals   07/15/20 1054  PainSc: 6    There is no height or weight on file to calculate BMI.  Advanced Directives 07/15/2020 03/11/2020 03/01/2020 02/23/2020 07/13/2019 06/29/2019 06/04/2019  Does Patient Have a Medical Advance Directive? Yes Yes Yes Yes Yes Yes Yes  Type of Paramedic of [place];Living will;Out of facility DNR (pink MOST or yellow form) Out of facility DNR (pink MOST or yellow form);Living will;Healthcare Power of [place];Living will;Out of facility DNR (pink MOST or yellow form) Living will;Healthcare Power of [place];Out of facility DNR (pink MOST or yellow form) [place];Living will [place];Out of facility DNR (pink MOST or yellow form) Healthcare Power of Attorney  Does patient want to make changes to medical advance directive? No - Patient declined No - Patient declined No - Patient declined No - Patient declined No - Patient declined No - Patient declined No - Patient declined  Copy of [place] in Chart? Yes - validated most recent copy scanned in chart (See row information) Yes - validated most recent copy scanned in chart (See row information) Yes - validated most recent copy scanned in chart (See row information) Yes - validated most recent copy scanned in chart (See row information) Yes - validated most recent copy scanned in chart (See row information) Yes - validated most recent copy scanned in chart (See row information) Yes - validated most recent copy scanned in chart (See row information)  Pre-existing out of facility DNR order (yellow form or pink MOST form) - - Pink MOST/Yellow Form most recent copy in chart -  Physician notified to receive inpatient order - - Yellow form placed in chart (order not valid for inpatient use) -    Current Medications (verified) Outpatient Encounter Medications as of 07/15/2020  Medication Sig  . acetaminophen (TYLENOL) 500 MG tablet Take 500 mg by mouth in the morning, at noon, and at bedtime.  Marland Kitchen alendronate (FOSAMAX) 70 MG tablet Take 70 mg by mouth every Saturday. Take with a full glass of water on an empty stomach.  Marland Kitchen aspirin EC 81 MG tablet Take 81 mg by mouth daily.  . bimatoprost (LUMIGAN) 0.01 % SOLN Place 1 drop into both eyes at bedtime.  . Calcium Carb-Cholecalciferol (CALCIUM 600+D3 PO) Take 2 tablets by mouth every evening.   . celecoxib (CELEBREX) 100 MG capsule Take 1 capsule (100 mg total) by mouth 2 (two) times daily. One in the morning, and one in the afternoon.  . Cholecalciferol (VITAMIN D3) 50 MCG (2000 UT) capsule Take 2,000 Units by mouth daily.  . DULoxetine (CYMBALTA) 60 MG capsule Take 60 mg by mouth at bedtime.  . fluticasone (FLONASE) 50 MCG/ACT nasal spray Place 1 spray into both nostrils daily as needed for allergies or rhinitis.   Marland Kitchen gabapentin (NEURONTIN) 300 MG capsule TAKE ONE CAPSULE BY MOUTH ONCE DAILY AT BEDTIME  . Inulin (FIBER CHOICE PO) Take 5 g by mouth daily as needed (to improve digestive health- mix and drink).   Marland Kitchen levothyroxine (SYNTHROID) 50 MCG tablet Take 50 mcg by mouth daily before breakfast.  . Multiple Vitamin (MULTIVITAMIN WITH MINERALS) TABS tablet Take 1 tablet by mouth daily with lunch.   Marland Kitchen  nystatin (MYCOSTATIN/NYSTOP) powder Apply 1 application topically in the morning, at noon, in the evening, and at bedtime. Under Breast.  . Omega-3 Fatty Acids (FISH OIL) 1200 MG CAPS Take 1,200 mg by mouth 3 (three) times daily.   Marland Kitchen omeprazole (PRILOSEC) 20 MG capsule Take 20 mg by mouth daily before breakfast.  . Polyethyl Glycol-Propyl Glycol (SYSTANE ULTRA OP) Place 1 drop into both eyes 3 (three) times daily as needed (dry  eyes).   . QUEtiapine (SEROQUEL) 25 MG tablet Take 25 mg by mouth at bedtime.  . traMADol (ULTRAM) 50 MG tablet Take 50 mg by mouth at bedtime.   No facility-administered encounter medications on file as of 07/15/2020.    Allergies (verified) Penicillins, Codeine, Lodine [etodolac], and Wild lettuce [lactuca virosa]   History: Past Medical History:  Diagnosis Date  . Abdominal pain, epigastric   . Allergic rhinitis due to other allergen   . Allergic rhinitis, cause unspecified   . Dermatophytosis of nail   . Diffuse cystic mastopathy   . Diverticulosis of colon (without mention of hemorrhage)   . DVT (deep venous thrombosis) ([place])   . Dysphagia, unspecified(787.20)   . Esophageal reflux   . Family history of malignant neoplasm of gastrointestinal tract   . Hiatal hernia   . Hyperlipidemia   . Hypertension   . Internal hemorrhoids without mention of complication   . 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 Obesity   . Oral aphthae   . Osteoarthrosis, unspecified whether generalized or localized, unspecified site   . Other abnormal blood chemistry   . Other voice and resonance disorders   . Pain in joint, pelvic region and thigh   . Palpitations   . Pancreatitis   . Peripheral vascular disease, unspecified ([place])   . Schatzki's ring   . Senile osteoporosis   . Torus fracture of fibula   . Unspecified constipation   . Unspecified hypothyroidism    Past Surgical History:  Procedure Laterality Date  . APPENDECTOMY    . BASAL CELL CARCINOMA EXCISION     right humerus, right temple, left side of nose  . BASAL CELL CARCINOMA EXCISION     chest, left cheek  . CATARACT EXTRACTION, BILATERAL    . CHOLECYSTECTOMY    . keratosis     left arm  . resection of ampullary tumor    . squamous cell cancer     Right leg  . squamous cell cancer left triceps Left    Dr. Lorenza Cambridge  . TONSILLECTOMY AND ADENOIDECTOMY     Family History  Problem Relation Age of Onset  .  Cancer Mother        spindle cell carcinoma on neck  . Colon cancer Father 26  . Breast cancer Sister   . Colon polyps Sister   . Heart disease Brother   . Breast cancer Other        neiece x 3  . Diabetes Niece   . Esophageal cancer Neg Hx   . Pancreatic cancer Neg Hx   . Kidney disease Neg Hx   . Liver disease Neg Hx    Social History   Socioeconomic History  . Marital status: Single    Spouse name: Not on file  . Number of children: 0  . Years of education: 28  . Highest education level: 12th grade  Occupational History  . Occupation: Retired  Tobacco Use  . Smoking status: Never Smoker  . Smokeless tobacco: Never  Used  Vaping Use  . Vaping Use: Never used  Substance and Sexual Activity  . Alcohol use: No  . Drug use: No  . Sexual activity: Never  Other Topics Concern  . Not on file  Social History Narrative  . Not on file   Social Determinants of Health   Financial Resource Strain: Low Risk   . Difficulty of Paying Living Expenses: Not hard at all  Food Insecurity: No Food Insecurity  . Worried About Charity fundraiser in the Last Year: Never true  . Ran Out of Food in the Last Year: Never true  Transportation Needs: No Transportation Needs  . Lack of Transportation (Medical): No  . Lack of Transportation (Non-Medical): No  Physical Activity: Inactive  . Days of Exercise per Week: 0 days  . Minutes of Exercise per Session: 0 min  Stress: No Stress Concern Present  . Feeling of Stress : Only a little  Social Connections: Socially Isolated  . Frequency of Communication with Friends and Family: More than three times a week  . Frequency of Social Gatherings with Friends and Family: Twice a week  . Attends Religious Services: Never  . Active Member of Clubs or Organizations: No  . Attends Archivist Meetings: Never  . Marital Status: Never married    Tobacco Counseling Counseling given: Not Answered   Clinical Intake:  Pre-visit  preparation completed: Yes  Pain : 0-10 Pain Score: 6  Pain Type: Chronic pain Pain Location: Hip Pain Orientation: Left Pain Radiating Towards: leg Pain Descriptors / Indicators: Aching Pain Onset: More than a month ago Pain Frequency: Intermittent Pain Relieving Factors: Tylenol, Tramadol Effect of Pain on Daily Activities: Varies, may have trouble walking  Pain Relieving Factors: Tylenol, Tramadol  BMI - recorded: 28.1 Nutritional Status: BMI 25 -29 Overweight Nutritional Risks: Nausea/ vomitting/ diarrhea, Non-healing wound (Scattered area on bilateral legs, recently seen by podiatrist) Diabetes: No  How often do you need to have someone help you when you read instructions, pamphlets, or other written materials from your doctor or pharmacy?: 3 - Sometimes What is the last grade level you completed in school?: High school  Diabetic?No  Interpreter Needed?: No  Information entered by :: Jaspal Pultz FNP-C   Activities of Daily Living In your present state of health, do you have any difficulty performing the following activities: 07/15/2020 03/01/2020  Hearing? Y N  Vision? Y N  Difficulty concentrating or making decisions? Y Y  Comment - Altered Mental Status  Walking or climbing stairs? Y Y  Comment - Pain in Left Knee  Dressing or bathing? Y Y  Comment - Verified by [name], due to Altered Mental Status  Doing errands, shopping? Y Y  Comment - Verified by [name], due to Altered Mental Status  Preparing Food and eating ? Y Y  Comment - Verified by [name], due to Altered Mental Status  Using the Toilet? N Y  Comment - Verified by [employer], due to Altered Mental Status  In the past six months, have you accidently leaked urine? Y Y  Comment - Urinary Incontinence  Do you have problems with loss of bowel control? Y N  Managing your Medications? Y Y  Comment Nephew helps Verified by [employer], due to Altered Mental Status  Managing your Finances? Y Y  Comment Nephew helps  Verified by [employer], due to Altered Mental Status  Housekeeping or managing your Housekeeping? Y Y  Comment Nephew helps Verified by [employer], due to Altered Mental  Status  Some recent data might be hidden    Patient Care Team: Gayland Curry, DO as PCP - General (Geriatric Medicine) [name], MD as Consulting Physician (Gastroenterology) Stanford Breed Denice Bors, MD as Consulting Physician (Cardiology) Druscilla Brownie, MD as Consulting Physician (Dermatology) [name], MD as Consulting Physician (Ophthalmology)  Indicate any recent Medical Services you may have received from other than [facility] providers in the past year (date may be approximate).     Assessment:   This is a routine wellness examination for [facility].  Hearing/Vision screen  Hearing Screening   125Hz  250Hz  500Hz  1000Hz  2000Hz  3000Hz  4000Hz  6000Hz  8000Hz   Right ear:           Left ear:           Comments: No Hearing Concerns.   Vision Screening Comments: No Vision Concerns. Patient has upcoming appointment on 09/10/2020. Patient wears prescription glasses.  Dietary issues and exercise activities discussed: Current Exercise Habits: The patient does not participate in regular exercise at present, Type of exercise: walking, Time (Minutes): 15, Frequency (Times/Week): 7 (dependent on weather), Weekly Exercise (Minutes/Week): 105, Intensity: Mild, Exercise limited by: None identified (Pain)  Goals    . Exercise More     Starting 07/04/16, I will attempt to exercise at least twice a week, walking or using my pedaling machine.     . Maintain Lifestyle     Starting today pt will maintain lifestyle.       Depression Screen PHQ 2/9 Scores 07/15/2020 03/01/2020 11/04/2019 07/13/2019 06/29/2019 02/26/2019 07/11/2018  PHQ - 2 Score 0 0 0 0 0 0 0  Exception Documentation - Medical reason - - - - -    Fall Risk Fall Risk  07/15/2020 03/11/2020 03/01/2020 02/23/2020 11/04/2019  Falls in the past year? 1 0 1 1 0  Number falls in past  yr: 0 0 1 1 0  Injury with Fall? 0 0 1 0 0  Comment - - - - -  Risk Factor Category  - - - - -  Risk for fall due to : - - History of fall(s);Impaired balance/gait;Impaired mobility;Mental status change;Orthopedic patient - -  Follow up - - Education provided;Falls prevention discussed - -    Any stairs in or around the home? No  If so, are there any without handrails? No  Home free of loose throw rugs in walkways, pet beds, electrical cords, etc? Yes  Adequate lighting in your home to reduce risk of falls? Yes   ASSISTIVE DEVICES UTILIZED TO PREVENT FALLS:  Life alert? Yes  Use of a cane, walker or w/c? Yes  Grab bars in the bathroom? No  Shower chair or bench in shower? No  Elevated toilet seat or a handicapped toilet? Yes   TIMED UP AND GO:  Was the test performed? No .  Length of time to ambulate 10 feet: N/A sec.   Gait slow and steady with assistive device  Cognitive Function: MMSE - Mini Mental State Exam 07/13/2019 01/07/2018 03/27/2017 03/27/2017 07/04/2016  Orientation to time 3 2 5 5 5   Orientation to Place 2 5 5 5 5   Registration 3 3 3 3 3   Attention/ Calculation 5 5 5 5 5   Recall 1 2 3 3 3   Language- name 2 objects 2 2 2 2 2   Language- repeat 1 1 1 1 1   Language- follow 3 step command 2 3 3 3 3   Language- read & follow direction 0 1 1 1  1  Write a sentence 1 1 1 1 1   Copy design 0 0 1 1 1   Total score 20 25 30 30 30      6CIT Screen 07/15/2020  What Year? 4 points  What month? 3 points  What time? 0 points  Count back from 20 4 points  Months in reverse 4 points  Repeat phrase 8 points  Total Score 23    Immunizations Immunization History  Administered Date(s) Administered  . Fluad Quad(high Dose 65+) 06/04/2019  . Influenza, High Dose Seasonal PF 05/09/2017, 06/19/2018, 06/11/2020  . Influenza,inj,Quad PF,6+ Mos 06/17/2013, 06/23/2014, 09/07/2015, 07/04/2016  . Influenza-Unspecified 07/09/2012  . PFIZER SARS-COV-2 Vaccination 11/02/2019, 11/23/2019   . Pneumococcal Conjugate-13 07/11/2018  . Pneumococcal Polysaccharide-23 07/04/2016  . Pneumococcal-Unspecified 09/10/1992  . Tdap 09/11/2007, 07/11/2018  . Zoster 11/28/2010  . Zoster Recombinat (Shingrix) 07/09/2017, 10/23/2017    TDAP status: Up to date Flu Vaccine status: Up to date Pneumococcal vaccine status: Up to date Covid-19 vaccine status: Completed vaccines  Qualifies for Shingles Vaccine? Yes   Zostavax completed No   Shingrix Completed?: Yes  Screening Tests Health Maintenance  Topic Date Due  . TETANUS/TDAP  07/11/2028  . INFLUENZA VACCINE  Completed  . DEXA SCAN  Completed  . COVID-19 Vaccine  Completed  . PNA vac Low Risk Adult  Completed    Health Maintenance  There are no preventive care reminders to display for this patient.  Colorectal cancer screening: No longer required.  Mammogram status: No longer required.  Bone Density status: Completed 10/09/2019. Results reflect: Bone density results: OSTEOPOROSIS. Repeat every 1 years.  Lung Cancer Screening: (Low Dose CT Chest recommended if Age 36-80 years, 30 pack-year currently smoking OR have quit w/in 15years.) does not qualify.   Lung Cancer Screening Referral: No  Additional Screening:  Hepatitis C Screening: does not qualify; Completed: No  Vision Screening: Recommended annual ophthalmology exams for early detection of glaucoma and other disorders of the eye. Is the patient up to date with their annual eye exam?  Yes  Who is the provider or what is the name of the office in which the patient attends annual eye exams? Dr. Satira Sark If pt is not established with a provider, would they like to be referred to a provider to establish care? No .   Dental Screening: Recommended annual dental exams for proper oral hygiene  Community Resource Referral / Chronic Care Management: CRR required this visit?  No   CCM required this visit?  No      Plan:     I have personally reviewed and noted the  following in the patient's chart:   . Medical and social history . Use of alcohol, tobacco or illicit drugs  . Current medications and supplements . Functional ability and status . Nutritional status . Physical activity . Advanced directives . List of other physicians . Hospitalizations, surgeries, and ER visits in previous 12 months . Vitals . Screenings to include cognitive, depression, and falls . Referrals and appointments  In addition, I have reviewed and discussed with patient certain preventive protocols, quality metrics, and best practice recommendations. A written personalized care plan for preventive services as well as general preventive health recommendations were provided to patient.     Sandrea Hughs, NP   07/15/2020   Nurse Notes: No

## 2020-07-15 NOTE — Patient Instructions (Signed)
Tonya Russell , Thank you for taking time to come for your Medicare Wellness Visit. I appreciate your ongoing commitment to your health goals. Please review the following plan we discussed and let me know if I can assist you in the future.   Screening recommendations/referrals: Colonoscopy N/A Mammogram N/A Bone Density - follow up in 1 year- 09/2020 Recommended yearly ophthalmology/optometry visit for glaucoma screening and checkup Recommended yearly dental visit for hygiene and checkup  Vaccinations: Influenza vaccine Up to date Pneumococcal vaccine Up to date Tdap vaccine Up to date Shingles vaccine Up to date  Advanced directives: Yes  Conditions/risks identified: Advanced age> 74yrs, hyperlipidemia, osteoporosis  Next appointment: 1 year   Preventive Care 40 Years and Older, Female Preventive care refers to lifestyle choices and visits with your health care provider that can promote health and wellness. What does preventive care include?  A yearly physical exam. This is also called an annual well check.  Dental exams once or twice a year.  Routine eye exams. Ask your health care provider how often you should have your eyes checked.  Personal lifestyle choices, including:  Daily care of your teeth and gums.  Regular physical activity.  Eating a healthy diet.  Avoiding tobacco and drug use.  Limiting alcohol use.  Practicing safe sex.  Taking low-dose aspirin every day.  Taking vitamin and mineral supplements as recommended by your health care provider. What happens during an annual well check? The services and screenings done by your health care provider during your annual well check will depend on your age, overall health, lifestyle risk factors, and family history of disease. Counseling  Your health care provider may ask you questions about your:  Alcohol use.  Tobacco use.  Drug use.  Emotional well-being.  Home and relationship  well-being.  Sexual activity.  Eating habits.  History of falls.  Memory and ability to understand (cognition).  Work and work Statistician.  Reproductive health. Screening  You may have the following tests or measurements:  Height, weight, and BMI.  Blood pressure.  Lipid and cholesterol levels. These may be checked every 5 years, or more frequently if you are over 47 years old.  Skin check.  Lung cancer screening. You may have this screening every year starting at age 38 if you have a 30-pack-year history of smoking and currently smoke or have quit within the past 15 years.  Fecal occult blood test (FOBT) of the stool. You may have this test every year starting at age 48.  Flexible sigmoidoscopy or colonoscopy. You may have a sigmoidoscopy every 5 years or a colonoscopy every 10 years starting at age 6.  Hepatitis C blood test.  Hepatitis B blood test.  Sexually transmitted disease (STD) testing.  Diabetes screening. This is done by checking your blood sugar (glucose) after you have not eaten for a while (fasting). You may have this done every 1-3 years.  Bone density scan. This is done to screen for osteoporosis. You may have this done starting at age 53.  Mammogram. This may be done every 1-2 years. Talk to your health care provider about how often you should have regular mammograms. Talk with your health care provider about your test results, treatment options, and if necessary, the need for more tests. Vaccines  Your health care provider may recommend certain vaccines, such as:  Influenza vaccine. This is recommended every year.  Tetanus, diphtheria, and acellular pertussis (Tdap, Td) vaccine. You may need a Td booster every 10 years.  Zoster vaccine. You may need this after age 56.  Pneumococcal 13-valent conjugate (PCV13) vaccine. One dose is recommended after age 76.  Pneumococcal polysaccharide (PPSV23) vaccine. One dose is recommended after age  43. Talk to your health care provider about which screenings and vaccines you need and how often you need them. This information is not intended to replace advice given to you by your health care provider. Make sure you discuss any questions you have with your health care provider. Document Released: 09/23/2015 Document Revised: 05/16/2016 Document Reviewed: 06/28/2015 Elsevier Interactive Patient Education  2017 [employer] Prevention in the Home Falls can cause injuries. They can happen to people of all ages. There are many things you can do to make your home safe and to help prevent falls. What can I do on the outside of my home?  Regularly fix the edges of walkways and driveways and fix any cracks.  Remove anything that might make you trip as you walk through a door, such as a raised step or threshold.  Trim any bushes or trees on the path to your home.  Use bright outdoor lighting.  Clear any walking paths of anything that might make someone trip, such as rocks or tools.  Regularly check to see if handrails are loose or broken. Make sure that both sides of any steps have handrails.  Any raised decks and porches should have guardrails on the edges.  Have any leaves, snow, or ice cleared regularly.  Use sand or salt on walking paths during winter.  Clean up any spills in your garage right away. This includes oil or grease spills. What can I do in the bathroom?  Use night lights.  Install grab bars by the toilet and in the tub and shower. Do not use towel bars as grab bars.  Use non-skid mats or decals in the tub or shower.  If you need to sit down in the shower, use a plastic, non-slip stool.  Keep the floor dry. Clean up any water that spills on the floor as soon as it happens.  Remove soap buildup in the tub or shower regularly.  Attach bath mats securely with double-sided non-slip rug tape.  Do not have throw rugs and other things on the floor that can make  you trip. What can I do in the bedroom?  Use night lights.  Make sure that you have a light by your bed that is easy to reach.  Do not use any sheets or blankets that are too big for your bed. They should not hang down onto the floor.  Have a firm chair that has side arms. You can use this for support while you get dressed.  Do not have throw rugs and other things on the floor that can make you trip. What can I do in the kitchen?  Clean up any spills right away.  Avoid walking on wet floors.  Keep items that you use a lot in easy-to-reach places.  If you need to reach something above you, use a strong step stool that has a grab bar.  Keep electrical cords out of the way.  Do not use floor polish or wax that makes floors slippery. If you must use wax, use non-skid floor wax.  Do not have throw rugs and other things on the floor that can make you trip. What can I do with my stairs?  Do not leave any items on the stairs.  Make sure that there are  handrails on both sides of the stairs and use them. Fix handrails that are broken or loose. Make sure that handrails are as long as the stairways.  Check any carpeting to make sure that it is firmly attached to the stairs. Fix any carpet that is loose or worn.  Avoid having throw rugs at the top or bottom of the stairs. If you do have throw rugs, attach them to the floor with carpet tape.  Make sure that you have a light switch at the top of the stairs and the bottom of the stairs. If you do not have them, ask someone to add them for you. What else can I do to help prevent falls?  Wear shoes that:  Do not have high heels.  Have rubber bottoms.  Are comfortable and fit you well.  Are closed at the toe. Do not wear sandals.  If you use a stepladder:  Make sure that it is fully opened. Do not climb a closed stepladder.  Make sure that both sides of the stepladder are locked into place.  Ask someone to hold it for you, if  possible.  Clearly mark and make sure that you can see:  Any grab bars or handrails.  First and last steps.  Where the edge of each step is.  Use tools that help you move around (mobility aids) if they are needed. These include:  Canes.  Walkers.  Scooters.  Crutches.  Turn on the lights when you go into a dark area. Replace any light bulbs as soon as they burn out.  Set up your furniture so you have a clear path. Avoid moving your furniture around.  If any of your floors are uneven, fix them.  If there are any pets around you, be aware of where they are.  Review your medicines with your doctor. Some medicines can make you feel dizzy. This can increase your chance of falling. Ask your doctor what other things that you can do to help prevent falls. This information is not intended to replace advice given to you by your health care provider. Make sure you discuss any questions you have with your health care provider. Document Released: 06/23/2009 Document Revised: 02/02/2016 Document Reviewed: 10/01/2014 Elsevier Interactive Patient Education  2017 [employer].

## 2020-07-18 ENCOUNTER — Telehealth: Payer: Self-pay

## 2020-07-18 NOTE — Telephone Encounter (Signed)
Ms. alexah, kivett are scheduled for a virtual visit with your provider today.    Just as we do with appointments in the office, we must obtain your consent to participate.  Your consent will be active for this visit and any virtual visit you may have with one of our providers in the next 365 days.    If you have a MyChart account, I can also send a copy of this consent to you electronically.  All virtual visits are billed to your insurance company just like a traditional visit in the office.  As this is a virtual visit, video technology does not allow for your provider to perform a traditional examination.  This may limit your provider's ability to fully assess your condition.  If your provider identifies any concerns that need to be evaluated in person or the need to arrange testing such as labs, EKG, etc, we will make arrangements to do so.    Although advances in technology are sophisticated, we cannot ensure that it will always work on either your end or our end.  If the connection with a video visit is poor, we may have to switch to a telephone visit.  With either a video or telephone visit, we are not always able to ensure that we have a secure connection.   I need to obtain your verbal consent now.   Are you willing to proceed with your visit today?   Daksha Koone has provided verbal consent on 07/15/2020 for a virtual visit (video or telephone).   Otis Peak, CMA 07/15/2020  10:30 AM

## 2020-07-25 ENCOUNTER — Other Ambulatory Visit: Payer: Self-pay | Admitting: *Deleted

## 2020-07-25 MED ORDER — LEVOTHYROXINE SODIUM 50 MCG PO TABS: 50.0000 ug | ORAL_TABLET | Freq: Every day | ORAL | 0 refills | Status: DC

## 2020-07-25 NOTE — Telephone Encounter (Signed)
Mary Sella, Nephew requested refill to be faxed to [facility]

## 2020-08-04 ENCOUNTER — Other Ambulatory Visit: Payer: Self-pay | Admitting: Internal Medicine

## 2020-08-05 ENCOUNTER — Other Ambulatory Visit: Payer: Self-pay | Admitting: Family

## 2020-08-05 ENCOUNTER — Other Ambulatory Visit: Payer: Self-pay | Admitting: Internal Medicine

## 2020-08-05 DIAGNOSIS — G301 Alzheimer's disease with late onset: Secondary | ICD-10-CM

## 2020-08-05 DIAGNOSIS — F22 Delusional disorders: Secondary | ICD-10-CM

## 2020-08-17 ENCOUNTER — Other Ambulatory Visit: Payer: Self-pay | Admitting: Internal Medicine

## 2020-08-17 ENCOUNTER — Other Ambulatory Visit: Payer: Self-pay | Admitting: Family

## 2020-09-05 ENCOUNTER — Other Ambulatory Visit: Payer: Self-pay | Admitting: *Deleted

## 2020-09-05 ENCOUNTER — Other Ambulatory Visit: Payer: Self-pay | Admitting: Internal Medicine

## 2020-09-05 DIAGNOSIS — M25562 Pain in left knee: Secondary | ICD-10-CM

## 2020-09-05 DIAGNOSIS — M542 Cervicalgia: Secondary | ICD-10-CM

## 2020-09-05 NOTE — Telephone Encounter (Signed)
Tonya Russell will call [facility]

## 2020-09-05 NOTE — Telephone Encounter (Signed)
If they have home based primary care then are they now the patients PCP? She can not have 2 PCP's

## 2020-09-05 NOTE — Telephone Encounter (Signed)
Patient nephew, [name] called and stated that patient needs refills on Tramadol and Gabapentin.  Epic [facility]: 06/28/2020  [facility] changed patient's Gabapentin to 100mg  in the morning and 300mg  at bedtime.   Pended Rx's and sent to [facility] for approval. (Dr. out of office)

## 2020-09-06 NOTE — Telephone Encounter (Signed)
fyi

## 2020-09-06 NOTE — Telephone Encounter (Signed)
Thank you, we will also need to update her PCP if she is not seeing Dr Renato Gails anymore.

## 2020-09-16 ENCOUNTER — Encounter: Payer: Self-pay | Admitting: Family

## 2020-09-16 ENCOUNTER — Other Ambulatory Visit: Payer: Self-pay

## 2020-09-16 ENCOUNTER — Ambulatory Visit (INDEPENDENT_AMBULATORY_CARE_PROVIDER_SITE_OTHER): Payer: Medicare Other | Admitting: Family

## 2020-09-16 VITALS — BP 130/88 | HR 73 | Temp 97.1°F | Resp 16 | Ht 63.0 in | Wt 159.6 lb

## 2020-09-16 DIAGNOSIS — E039 Hypothyroidism, unspecified: Secondary | ICD-10-CM | POA: Diagnosis not present

## 2020-09-16 DIAGNOSIS — F02818 Dementia in other diseases classified elsewhere, unspecified severity, with other behavioral disturbance: Secondary | ICD-10-CM

## 2020-09-16 DIAGNOSIS — E785 Hyperlipidemia, unspecified: Secondary | ICD-10-CM | POA: Diagnosis not present

## 2020-09-16 DIAGNOSIS — M1712 Unilateral primary osteoarthritis, left knee: Secondary | ICD-10-CM | POA: Diagnosis not present

## 2020-09-16 DIAGNOSIS — G301 Alzheimer's disease with late onset: Secondary | ICD-10-CM | POA: Diagnosis not present

## 2020-09-16 DIAGNOSIS — I10 Essential (primary) hypertension: Secondary | ICD-10-CM

## 2020-09-16 DIAGNOSIS — F0281 Dementia in other diseases classified elsewhere with behavioral disturbance: Secondary | ICD-10-CM

## 2020-09-16 NOTE — Progress Notes (Signed)
Provider: Webb Silversmith Alashia Brownfield FNP-C   Schmerge, Meredeth Ide, NP  Patient Care Team: Jennette Dubin, NP as PCP - General (Nurse Practitioner) [name], MD as Consulting Physician (Gastroenterology) Stanford Breed Denice Bors, MD as Consulting Physician (Cardiology) Druscilla Brownie, MD as Consulting Physician (Dermatology) [name], MD as Consulting Physician (Ophthalmology)  Extended Emergency Contact Information Primary Emergency Contact: Causey,Edna Address: [address]          [place] [address] [name] of [place] Phone: [phone] Relation: Friend Secondary Emergency Contact: [place], [place] of [place] Phone: [phone] Relation: Sister  Code Status:  Full Code  Goals of care: Advanced Directive information Advanced Directives 09/16/2020  Does Patient Have a Medical Advance Directive? Yes  Type of Paramedic of [place];Living will;Out of facility DNR (pink MOST or yellow form)  Does patient want to make changes to medical advance directive? No - Patient declined  Copy of [place] in Chart? Yes - validated most recent copy scanned in chart (See row information)  Pre-existing out of facility DNR order (yellow form or pink MOST form) -     Chief Complaint  Patient presents with  . Medical Management of Chronic Issues    6 month follow up and discuss labs  . Labs    Fasting Labs.    HPI:  Pt is a 87 y.o. female seen today for 6 months follow up for medical management of chronic diseases.Here with Nephew.she denies any acute issues today though Nephew states trying to place her in a skilled [place] or Memory care unit.Had a facility but lost bed due to COVID-19 no admissions were accepted now in the process of looking for another facility.He request FL 2 to be filled which he will notify provider once he gets a facility.He states patient has sundowning sometimes  does not even remember who the Nephew is.on Seroquel 25 mg tablet at bedtime.Also requires total care assistance.He feels stressed since he works full time and also looks after his wife who has health issues too.  Neuropathy pain - States [facility] health prescribed Gabapentin 100 mg capsule and 300 mg capsule at bedtime. Pain has been more controlled.  Hypertension - Np home blood pressure readings for review.she denies any signs of hypotension.off medication.  Hyperlipidemia - previous LDL 150,TRG 125 and chol 231 slightly improved from previous.Not on Statin due to advance age.on Omega- 3 fatty acids 1200 mg caps three times daily.   Hypothyroidism - Previous TSH has been within normal.On Levothyroxine 50 mcg tablet daily.   Osteoarthritis - pain on knees impairs her walking.tramadol and Tylenol effective.     Past Medical History:  Diagnosis Date  . Abdominal pain, epigastric   . Allergic rhinitis due to other allergen   . Allergic rhinitis, cause unspecified   . Dermatophytosis of nail   . Diffuse cystic mastopathy   . Diverticulosis of colon (without mention of hemorrhage)   . DVT (deep venous thrombosis) ([place])   . Dysphagia, unspecified(787.20)   . Esophageal reflux   . Family history of malignant neoplasm of gastrointestinal tract   . Hiatal hernia   . Hyperlipidemia   . Hypertension   . Internal hemorrhoids without mention of complication   . 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 Obesity   . Oral aphthae   . Osteoarthrosis, unspecified whether generalized or localized, unspecified site   .  Other abnormal blood chemistry   . Other voice and resonance disorders   . Pain in joint, pelvic region and thigh   . Palpitations   . Pancreatitis   . Peripheral vascular disease, unspecified ([place])   . Schatzki's ring   . Senile osteoporosis   . Torus fracture of fibula   . Unspecified constipation   . Unspecified hypothyroidism    Past Surgical History:  Procedure  Laterality Date  . APPENDECTOMY    . BASAL CELL CARCINOMA EXCISION     right humerus, right temple, left side of nose  . BASAL CELL CARCINOMA EXCISION     chest, left cheek  . CATARACT EXTRACTION, BILATERAL    . CHOLECYSTECTOMY    . keratosis     left arm  . resection of ampullary tumor    . squamous cell cancer     Right leg  . squamous cell cancer left triceps Left    Dr. Lorenza Cambridge  . TONSILLECTOMY AND ADENOIDECTOMY      Allergies  Allergen Reactions  . Penicillins Other (See Comments)    Exact reaction not recalled- has not been taken "in a long time" Has patient had a PCN reaction causing immediate rash, facial/tongue/throat swelling, SOB or lightheadedness with hypotension: yes Has patient had a PCN reaction causing severe rash involving mucus membranes or skin necrosis: no Has patient had a PCN reaction that required hospitalization: no Has patient had a PCN reaction occurring within the last 10 years: no If all of the above answers are "NO", then may proceed with Cephalosporin   . Codeine Nausea And Vomiting  . Lodine [Etodolac] Nausea And Vomiting  . Wild Lettuce [Lactuca Virosa] Diarrhea    Allergies as of 09/16/2020      Reactions   Penicillins Other (See Comments)   Exact reaction not recalled- has not been taken "in a long time" Has patient had a PCN reaction causing immediate rash, facial/tongue/throat swelling, SOB or lightheadedness with hypotension: yes Has patient had a PCN reaction causing severe rash involving mucus membranes or skin necrosis: no Has patient had a PCN reaction that required hospitalization: no Has patient had a PCN reaction occurring within the last 10 years: no If all of the above answers are "NO", then may proceed with Cephalosporin    Codeine Nausea And Vomiting   Lodine [etodolac] Nausea And Vomiting   Wild Lettuce [lactuca Virosa] Diarrhea      Medication List       Accurate as of September 16, 2020 10:37 AM. If you have any  questions, ask your nurse or doctor.        acetaminophen 500 MG tablet Commonly known as: TYLENOL Take 500 mg by mouth in the morning, at noon, and at bedtime.   alendronate 70 MG tablet Commonly known as: FOSAMAX TAKE 1 TABLET BY MOUTH ONCE A WEEK. TAKE WITH A FULL GLASS OF WATER ON AN EMPTY STOMACH.   aspirin EC 81 MG tablet Take 81 mg by mouth daily.   bimatoprost 0.01 % Soln Commonly known as: LUMIGAN Place 1 drop into both eyes at bedtime.   CALCIUM 600+D3 PO Take 2 tablets by mouth every evening.   celecoxib 100 MG capsule Commonly known as: CELEBREX TAKE 1 CAPSULE (100 MG TOTAL) BY MOUTH 2 (TWO) TIMES DAILY. ONE IN THE MORNING, AND ONE IN THE AFTERNOON.   DULoxetine 60 MG capsule Commonly known as: CYMBALTA Take 60 mg by mouth at bedtime.   FIBER CHOICE PO Take  5 g by mouth daily as needed (to improve digestive health- mix and drink).   Fish Oil 1200 MG Caps Take 1,200 mg by mouth 3 (three) times daily.   fluticasone 50 MCG/ACT nasal spray Commonly known as: FLONASE Place 1 spray into both nostrils daily as needed for allergies or rhinitis.   gabapentin 100 MG capsule Commonly known as: NEURONTIN Take 100 mg by mouth in the morning.   gabapentin 300 MG capsule Commonly known as: NEURONTIN TAKE ONE CAPSULE BY MOUTH ONCE DAILY AT BEDTIME   levothyroxine 50 MCG tablet Commonly known as: SYNTHROID TAKE 1 TABLET BY MOUTH DAILY BEFORE BREAKFAST. ON EMPTY STOMACH 30 MINUTES BEFORE EATING.   multivitamin with minerals Tabs tablet Take 1 tablet by mouth daily with lunch.   nystatin powder Commonly known as: MYCOSTATIN/NYSTOP Apply 1 application topically in the morning, at noon, in the evening, and at bedtime. Under Breast.   omeprazole 20 MG capsule Commonly known as: PRILOSEC TAKE 1 CAPSULE DAILY TO REDUCE STOMACH ACID.   QUEtiapine 25 MG tablet Commonly known as: SEROQUEL TAKE 1 TABLET BY MOUTH EVERYDAY AT BEDTIME   SYSTANE ULTRA OP Place 1 drop  into both eyes 3 (three) times daily as needed (dry eyes).   traMADol 50 MG tablet Commonly known as: ULTRAM Take 50 mg by mouth at bedtime.   Vitamin D3 50 MCG (2000 UT) capsule Take 2,000 Units by mouth daily.       Review of Systems  Constitutional: Negative for appetite change, chills, fatigue and fever.  HENT: Negative for congestion, postnasal drip, rhinorrhea, sinus pressure, sinus pain, sneezing, sore throat and trouble swallowing.   Eyes: Positive for visual disturbance. Negative for discharge, redness and itching.       See Dr.Tanner every 6 months  Respiratory: Negative for cough, chest tightness, shortness of breath and wheezing.   Cardiovascular: Negative for chest pain, palpitations and leg swelling.  Gastrointestinal: Negative for abdominal distention, abdominal pain, constipation, diarrhea, nausea and vomiting.  Musculoskeletal: Positive for arthralgias and gait problem.  Skin: Negative for color change, pallor and rash.    Immunization History  Administered Date(s) Administered  . Fluad Quad(high Dose 65+) 06/04/2019  . Influenza, High Dose Seasonal PF 05/09/2017, 06/19/2018, 06/11/2020  . Influenza,inj,Quad PF,6+ Mos 06/17/2013, 06/23/2014, 09/07/2015, 07/04/2016  . Influenza-Unspecified 07/09/2012  . PFIZER SARS-COV-2 Vaccination 11/02/2019, 11/23/2019  . Pneumococcal Conjugate-13 07/11/2018  . Pneumococcal Polysaccharide-23 07/04/2016  . Pneumococcal-Unspecified 09/10/1992  . Tdap 09/11/2007, 07/11/2018  . Zoster 11/28/2010  . Zoster Recombinat (Shingrix) 07/09/2017, 10/23/2017   Pertinent  Health Maintenance Due  Topic Date Due  . INFLUENZA VACCINE  Completed  . DEXA SCAN  Completed  . PNA vac Low Risk Adult  Completed   Fall Risk  09/16/2020 07/15/2020 03/11/2020 03/01/2020 02/23/2020  Falls in the past year? 0 1 0 1 1  Number falls in past yr: 0 0 0 1 1  Injury with Fall? 0 0 0 1 0  Comment - - - - -  Risk Factor Category  - - - - -  Risk for fall due  to : - - - History of fall(s);Impaired balance/gait;Impaired mobility;Mental status change;Orthopedic patient -  Follow up - - - Education provided;Falls prevention discussed -   Functional Status Survey:    Vitals:   09/16/20 1003  BP: 130/88  Resp: 16  Temp: (!) 97.1 F (36.2 C)  Weight: 159 lb 9.6 oz (72.4 kg)  Height: 5\' 3"  (1.6 m)   Body mass index  is 28.27 kg/m. Physical Exam Vitals reviewed.  Constitutional:      General: She is not in acute distress.    Appearance: She is overweight. She is not ill-appearing.  HENT:     Head: Normocephalic.     Right Ear: Tympanic membrane, ear canal and external ear normal. There is no impacted cerumen.     Left Ear: Tympanic membrane, ear canal and external ear normal. There is no impacted cerumen.     Nose: Nose normal. No congestion or rhinorrhea.     Mouth/Throat:     Mouth: Mucous membranes are moist.     Pharynx: Oropharynx is clear. No oropharyngeal exudate or posterior oropharyngeal erythema.  Eyes:     General: No scleral icterus.       Right eye: No discharge.        Left eye: No discharge.     Extraocular Movements: Extraocular movements intact.     Conjunctiva/sclera: Conjunctivae normal.     Pupils: Pupils are equal, round, and reactive to light.  Neck:     Vascular: No carotid bruit.  Cardiovascular:     Rate and Rhythm: Normal rate and regular rhythm.     Pulses: Normal pulses.     Heart sounds: Normal heart sounds. No murmur heard. No friction rub. No gallop.   Pulmonary:     Effort: Pulmonary effort is normal. No respiratory distress.     Breath sounds: Normal breath sounds. No wheezing, rhonchi or rales.  Chest:     Chest wall: No tenderness.  Abdominal:     General: Bowel sounds are normal. There is no distension.     Palpations: Abdomen is soft. There is no mass.     Tenderness: There is no abdominal tenderness. There is no right CVA tenderness, left CVA tenderness, guarding or rebound.   Musculoskeletal:        General: No swelling or tenderness.     Cervical back: Normal range of motion. No rigidity or tenderness.     Right lower leg: No edema.     Left lower leg: No edema.     Comments: Unsteady gait walks with a walker   Lymphadenopathy:     Cervical: No cervical adenopathy.  Skin:    General: Skin is warm and dry.     Coloration: Skin is not pale.     Findings: No bruising, erythema or rash.     Comments: Small pea size hard lesion between gluteal folds non-tender to palpation and without any signs of infection unclear how long she has had it.   Neurological:     Mental Status: She is alert. Mental status is at baseline.     Motor: No weakness.     Gait: Gait abnormal.  Psychiatric:        Mood and Affect: Mood normal.        Speech: Speech normal.        Behavior: Behavior normal.        Thought Content: Thought content normal.        Cognition and Memory: Memory is impaired.     Labs reviewed: Recent Labs    11/10/19 1205 02/19/20 1413 03/18/20 1032  NA 143 140 144  K 4.2 4.1 4.4  CL 105 105 107  CO2 27 26 27   GLUCOSE 98 105* 99  BUN 18 19 29*  CREATININE 0.80 0.73 0.68  CALCIUM 9.3 9.3 9.8   Recent Labs    11/10/19 1205 02/19/20 1413  03/18/20 1032  AST 23 40 18  ALT 22 42 15  ALKPHOS  --  42  --   BILITOT 0.6 0.6 0.4  PROT 6.9 7.3 7.0  ALBUMIN  --  4.0  --    Recent Labs    11/10/19 1205 02/19/20 1413 03/18/20 1032  WBC 7.0 5.8 5.8  NEUTROABS 4,774  --  2,587  HGB 12.6 12.2 12.4  HCT 38.3 38.5 37.6  MCV 98.0 100.8* 97.9  PLT 207 159 177   Lab Results  Component Value Date   TSH 2.34 03/18/2020   Lab Results  Component Value Date   HGBA1C 5.3 07/01/2017   Lab Results  Component Value Date   CHOL 231 (H) 03/18/2020   HDL 56 03/18/2020   LDLCALC 150 (H) 03/18/2020   LDLDIRECT 113.2 10/18/2006   TRIG 125 03/18/2020   CHOLHDL 4.1 03/18/2020    Significant Diagnostic Results in last 30 days:  No results  found.  Assessment/Plan 1. Essential hypertension B/p well controlled. - Continue on ASA - CBC/diff  - TSH - CMP  2. Hyperlipidemia, unspecified hyperlipidemia type LDL not at goal. - continue on Omega-3 Fatty Acid.Not a candidate for Statin. - continue dietary modification.    3. Acquired hypothyroidism Lab Results  Component Value Date   TSH 2.34 03/18/2020  Continue on Levothyroxine 50 mcg tablet daily. - TSH  4. Primary osteoarthritis of left knee Chronic.continue on Tramadol and Tylenol   5. Late onset Alzheimer's disease with behavioral disturbance ([place]) Sundowning behaviors reported.Care giver burn out in the process of looking  for a SNF or [facility] provider once he finds a facility then will fill out F L2   Family/ staff Communication: Reviewed plan of care with patient and Nephew.  Labs/tests ordered:   - CBC/diff  - TSH - CMP   Next Appointment : 6 months for medical management of chronic issues.  Sandrea Hughs, NP

## 2020-09-17 LAB — COMPREHENSIVE METABOLIC PANEL
AG Ratio: 1.5 (calc) (ref 1.0–2.5)
ALT: 19 U/L (ref 6–29)
AST: 20 U/L (ref 10–35)
Albumin: 4.3 g/dL (ref 3.6–5.1)
Alkaline phosphatase (APISO): 53 U/L (ref 37–153)
BUN: 25 mg/dL (ref 7–25)
CO2: 28 mmol/L (ref 20–32)
Calcium: 9.7 mg/dL (ref 8.6–10.4)
Chloride: 106 mmol/L (ref 98–110)
Creat: 0.81 mg/dL (ref 0.60–0.88)
Globulin: 2.8 g/dL (calc) (ref 1.9–3.7)
Glucose, Bld: 98 mg/dL (ref 65–99)
Potassium: 4.6 mmol/L (ref 3.5–5.3)
Sodium: 143 mmol/L (ref 135–146)
Total Bilirubin: 0.4 mg/dL (ref 0.2–1.2)
Total Protein: 7.1 g/dL (ref 6.1–8.1)

## 2020-09-17 LAB — CBC WITH DIFFERENTIAL/PLATELET
Absolute Monocytes: 416 cells/uL (ref 200–950)
Basophils Absolute: 38 cells/uL (ref 0–200)
Basophils Relative: 0.6 %
Eosinophils Absolute: 230 cells/uL (ref 15–500)
Eosinophils Relative: 3.6 %
HCT: 37.6 % (ref 35.0–45.0)
Hemoglobin: 12.6 g/dL (ref 11.7–15.5)
Lymphs Abs: 2131 cells/uL (ref 850–3900)
MCH: 32.2 pg (ref 27.0–33.0)
MCHC: 33.5 g/dL (ref 32.0–36.0)
MCV: 96.2 fL (ref 80.0–100.0)
MPV: 10.9 fL (ref 7.5–12.5)
Monocytes Relative: 6.5 %
Neutro Abs: 3584 cells/uL (ref 1500–7800)
Neutrophils Relative %: 56 %
Platelets: 170 10*3/uL (ref 140–400)
RBC: 3.91 10*6/uL (ref 3.80–5.10)
RDW: 11.3 % (ref 11.0–15.0)
Total Lymphocyte: 33.3 %
WBC: 6.4 10*3/uL (ref 3.8–10.8)

## 2020-09-17 LAB — TSH: TSH: 3.21 mIU/L (ref 0.40–4.50)

## 2020-10-07 ENCOUNTER — Ambulatory Visit: Payer: Medicare Other | Admitting: Podiatry

## 2020-10-25 DIAGNOSIS — H401134 Primary open-angle glaucoma, bilateral, indeterminate stage: Secondary | ICD-10-CM | POA: Diagnosis not present

## 2020-10-25 DIAGNOSIS — H04123 Dry eye syndrome of bilateral lacrimal glands: Secondary | ICD-10-CM | POA: Diagnosis not present

## 2020-10-25 DIAGNOSIS — H18593 Other hereditary corneal dystrophies, bilateral: Secondary | ICD-10-CM | POA: Diagnosis not present

## 2020-10-25 DIAGNOSIS — H524 Presbyopia: Secondary | ICD-10-CM | POA: Diagnosis not present

## 2020-10-28 ENCOUNTER — Other Ambulatory Visit: Payer: Self-pay

## 2020-10-28 ENCOUNTER — Ambulatory Visit (INDEPENDENT_AMBULATORY_CARE_PROVIDER_SITE_OTHER): Payer: Medicare Other | Admitting: Podiatry

## 2020-10-28 DIAGNOSIS — B351 Tinea unguium: Secondary | ICD-10-CM | POA: Diagnosis not present

## 2020-10-28 DIAGNOSIS — M79675 Pain in left toe(s): Secondary | ICD-10-CM | POA: Diagnosis not present

## 2020-10-28 DIAGNOSIS — M79674 Pain in right toe(s): Secondary | ICD-10-CM | POA: Diagnosis not present

## 2020-10-28 DIAGNOSIS — M7752 Other enthesopathy of left foot: Secondary | ICD-10-CM | POA: Diagnosis not present

## 2020-11-06 ENCOUNTER — Encounter: Payer: Self-pay | Admitting: Podiatry

## 2020-11-06 NOTE — Progress Notes (Signed)
Subjective:  Patient ID: Tonya Russell, female    DOB: October 15, 1924,  MRN: [id number]  Chief Complaint  Patient presents with  . Nail Problem    Nail trim    87 y.o. female returns for the above complaint.  Patient presents with thickened elongated dystrophic toenails x10.  Patient states is painful to touch.  She has a hard time ambulating and cutting them down herself.  She would like for me to help them debrided down.  She denies being a diabetic however she does have a mild component of peripheral vascular disease.  She also has secondary complaint of left first MPJ pain.  Patient states it hurts with ambulation.  She states that it is painful to walk with it.  She would like to discuss treatment options.  She does have hyperkeratotic lesion to the left submetatarsal 1 as well.  She denies any other acute complaints.  Objective:  There were no vitals filed for this visit. Podiatric Exam: Vascular: Decreased dorsalis pedis and posterior tibial pulses are faintly palpable bilateral. Capillary return is sluggish.  Bluish discoloration noted to the distal tip of the toes without any signs of infection/gangrene. Sensorium: Normal Semmes Weinstein monofilament test. Normal tactile sensation bilaterally. Nail Exam: Pt has thick disfigured discolored nails with subungual debris noted bilateral entire nail hallux through fifth toenails.  Pain on palpation to the nails. Ulcer Exam: There is no evidence of ulcer or pre-ulcerative changes or infection. Orthopedic Exam: Muscle tone and strength are WNL. No limitations in general ROM. No crepitus or effusions noted. HAV  B/L.  Hammer toes 2-5  B/L. Skin: No Porokeratosis. No infection or ulcers.  Hyperkeratotic lesion with pain on palpation left submetatarsal 1 noted.  Slight plantarflexed first metatarsal 1 noted.  Excessive stress on the sesamoid complex noted.  Pain with range of motion left first MPJ.  Intra-articular pain noted.    Assessment & Plan:    1. Pain due to onychomycosis of toenails of both feet   2. Capsulitis of metatarsophalangeal (MTP) joint of left foot     Patient was evaluated and treated and all questions answered.  Raynaud's phenomenon to bilateral toes without gangrene -I explained to the patient the etiology of Raynaud's phenomenon and various treatment options were discussed.  I discussed with her that even though she does not have any autoimmune diseases this could be primary Raynaud's phenomenon that can lead to discoloration of the digits especially during weather changes.  Patient states understanding.  At this time will consider monitoring it and if there is any acute signs of gangrene or any other vascular compromise we will discuss treatment options at that time.  Patient states understanding  Left first metatarsophalangeal joint capsulitis -I explained the patient the etiology of capsulitis of the metatarsophalangeal joint and various treatment options were discussed.  Given that she is putting a great aggressive pressure on the left first metatarsophalangeal joint.  I believe patient will benefit from steroid injection to help decrease acute inflammatory component associated pain.  Patient agrees with the plan like to proceed with a steroid injection. -A steroid injection was performed at Left first MPJ using 1% plain Lidocaine and 10 mg of Kenalog. This was well tolerated.   Onychomycosis with pain  -Nails palliatively debrided as below. -Educated on self-care  Procedure: Nail Debridement Rationale: pain  Type of Debridement: manual, sharp debridement. Instrumentation: Nail nipper, rotary burr. Number of Nails: 10  Procedures and Treatment: Consent by patient was obtained for treatment procedures.  The patient understood the discussion of treatment and procedures well. All questions were answered thoroughly reviewed. Debridement of mycotic and hypertrophic toenails, 1 through 5 bilateral and clearing of  subungual debris. No ulceration, no infection noted.  Return Visit-Office Procedure: Patient instructed to return to the office for a follow up visit 3 months for continued evaluation and treatment.  Boneta Lucks, DPM    No follow-ups on file.

## 2021-01-10 ENCOUNTER — Other Ambulatory Visit: Payer: Self-pay | Admitting: *Deleted

## 2021-01-10 DIAGNOSIS — F22 Delusional disorders: Secondary | ICD-10-CM

## 2021-01-10 DIAGNOSIS — G301 Alzheimer's disease with late onset: Secondary | ICD-10-CM

## 2021-01-10 MED ORDER — QUETIAPINE FUMARATE 25 MG PO TABS
ORAL_TABLET | ORAL | 1 refills | Status: DC
Start: 2021-01-10 — End: 2021-06-19

## 2021-01-10 MED ORDER — DULOXETINE HCL 60 MG PO CPEP: 60.0000 mg | ORAL_CAPSULE | Freq: Every day | ORAL | 1 refills | Status: DC

## 2021-01-10 NOTE — Telephone Encounter (Signed)
Pharmacy requested refill Pended Rx and sent to [name] out of office) for approval due to [place].

## 2021-01-12 DIAGNOSIS — Z23 Encounter for immunization: Secondary | ICD-10-CM | POA: Diagnosis not present

## 2021-01-12 DIAGNOSIS — D1801 Hemangioma of skin and subcutaneous tissue: Secondary | ICD-10-CM | POA: Diagnosis not present

## 2021-01-12 DIAGNOSIS — L308 Other specified dermatitis: Secondary | ICD-10-CM | POA: Diagnosis not present

## 2021-01-12 DIAGNOSIS — D485 Neoplasm of uncertain behavior of skin: Secondary | ICD-10-CM | POA: Diagnosis not present

## 2021-01-12 DIAGNOSIS — L820 Inflamed seborrheic keratosis: Secondary | ICD-10-CM | POA: Diagnosis not present

## 2021-01-12 DIAGNOSIS — L570 Actinic keratosis: Secondary | ICD-10-CM | POA: Diagnosis not present

## 2021-01-13 ENCOUNTER — Other Ambulatory Visit: Payer: Self-pay

## 2021-01-13 ENCOUNTER — Other Ambulatory Visit: Payer: Self-pay | Admitting: Family

## 2021-01-13 MED ORDER — LEVOTHYROXINE SODIUM 50 MCG PO TABS: ORAL_TABLET | ORAL | 1 refills | Status: DC

## 2021-01-13 NOTE — Telephone Encounter (Signed)
Nephew called stating levothyroxine is needed to be sent to [facility] on [address].  Called [name] the nephew to confirm pharmacy. He states since she has a new medicare plan, Rx to be sent to [facility]. Patient not completely out of med.

## 2021-01-25 ENCOUNTER — Ambulatory Visit (INDEPENDENT_AMBULATORY_CARE_PROVIDER_SITE_OTHER): Payer: Medicare Other | Admitting: Podiatry

## 2021-01-25 ENCOUNTER — Encounter: Payer: Self-pay | Admitting: Podiatry

## 2021-01-25 ENCOUNTER — Other Ambulatory Visit: Payer: Self-pay

## 2021-01-25 DIAGNOSIS — I739 Peripheral vascular disease, unspecified: Secondary | ICD-10-CM | POA: Diagnosis not present

## 2021-01-25 DIAGNOSIS — M79674 Pain in right toe(s): Secondary | ICD-10-CM | POA: Diagnosis not present

## 2021-01-25 DIAGNOSIS — M79675 Pain in left toe(s): Secondary | ICD-10-CM | POA: Diagnosis not present

## 2021-01-25 DIAGNOSIS — B351 Tinea unguium: Secondary | ICD-10-CM | POA: Diagnosis not present

## 2021-01-25 NOTE — Progress Notes (Signed)
Subjective:  Patient ID: Tonya Russell, female    DOB: 1925/03/19,  MRN: [id number]  Chief Complaint  Patient presents with  . Callouses    Callus    87 y.o. female returns for the above complaint.  Patient presents with thickened elongated dystrophic toenails x10.  Patient states is painful to touch.  She has a hard time ambulating and cutting them down herself.  She would like for me to help them debrided down.  She denies being a diabetic however she does have a mild component of peripheral vascular disease.  She denies any secondary complaints  Objective:  There were no vitals filed for this visit. Podiatric Exam: Vascular: Decreased dorsalis pedis and posterior tibial pulses are faintly palpable bilateral. Capillary return is sluggish.  Bluish discoloration noted to the distal tip of the toes without any signs of infection/gangrene. Sensorium: Normal Semmes Weinstein monofilament test. Normal tactile sensation bilaterally. Nail Exam: Pt has thick disfigured discolored nails with subungual debris noted bilateral entire nail hallux through fifth toenails.  Pain on palpation to the nails. Ulcer Exam: There is no evidence of ulcer or pre-ulcerative changes or infection. Orthopedic Exam: Muscle tone and strength are WNL. No limitations in general ROM. No crepitus or effusions noted. HAV  B/L.  Hammer toes 2-5  B/L. Skin: No Porokeratosis. No infection or ulcers.  Hyperkeratotic lesion with pain on palpation left submetatarsal 1 noted.  Slight plantarflexed first metatarsal 1 noted.  Excessive stress on the sesamoid complex noted.  Pain with range of motion left first MPJ.  Intra-articular pain noted.    Assessment & Plan:   1. Pain due to onychomycosis of toenails of both feet   2. Peripheral vascular disease, unspecified ([place])     Patient was evaluated and treated and all questions answered.  Raynaud's phenomenon to bilateral toes without gangrene -I explained to the patient the  etiology of Raynaud's phenomenon and various treatment options were discussed.  I discussed with her that even though she does not have any autoimmune diseases this could be primary Raynaud's phenomenon that can lead to discoloration of the digits especially during weather changes.  Patient states understanding.  At this time will consider monitoring it and if there is any acute signs of gangrene or any other vascular compromise we will discuss treatment options at that time.  Patient states understanding  Left first metatarsophalangeal joint capsulitis -I explained the patient the etiology of capsulitis of the metatarsophalangeal joint and various treatment options were discussed.  Given that she is putting a great aggressive pressure on the left first metatarsophalangeal joint.  I believe patient will benefit from steroid injection to help decrease acute inflammatory component associated pain.  Patient agrees with the plan like to proceed with a steroid injection. -A steroid injection was performed at Left first MPJ using 1% plain Lidocaine and 10 mg of Kenalog. This was well tolerated.   Onychomycosis with pain  -Nails palliatively debrided as below. -Educated on self-care  Procedure: Nail Debridement Rationale: pain  Type of Debridement: manual, sharp debridement. Instrumentation: Nail nipper, rotary burr. Number of Nails: 10  Procedures and Treatment: Consent by patient was obtained for treatment procedures. The patient understood the discussion of treatment and procedures well. All questions were answered thoroughly reviewed. Debridement of mycotic and hypertrophic toenails, 1 through 5 bilateral and clearing of subungual debris. No ulceration, no infection noted.  Return Visit-Office Procedure: Patient instructed to return to the office for a follow up visit 3 months for continued  evaluation and treatment.  Boneta Lucks, DPM    No follow-ups on file.

## 2021-01-30 ENCOUNTER — Other Ambulatory Visit: Payer: Self-pay | Admitting: *Deleted

## 2021-01-30 NOTE — Telephone Encounter (Signed)
Mary Sella, Nephew called requesting refill on medication.  Pended Rx and sent to [facility] for approval. ([place] out of office)

## 2021-01-31 MED ORDER — TRAMADOL HCL 50 MG PO TABS: 50.0000 mg | ORAL_TABLET | Freq: Every day | ORAL | 0 refills | Status: DC

## 2021-03-01 ENCOUNTER — Other Ambulatory Visit: Payer: Self-pay | Admitting: Nurse Practitioner

## 2021-03-01 NOTE — Telephone Encounter (Signed)
Pharmacy requested refill.  Tonya Russell also called requesting refill.  Epic LR: 01/31/2021 Pended Rx and sent to [facility] for approval.

## 2021-03-17 ENCOUNTER — Ambulatory Visit (INDEPENDENT_AMBULATORY_CARE_PROVIDER_SITE_OTHER): Payer: Medicare Other | Admitting: Family

## 2021-03-17 ENCOUNTER — Encounter: Payer: Self-pay | Admitting: Family

## 2021-03-17 ENCOUNTER — Other Ambulatory Visit: Payer: Self-pay

## 2021-03-17 VITALS — BP 138/72 | HR 90 | Temp 97.1°F | Resp 20 | Ht 63.0 in | Wt 150.1 lb

## 2021-03-17 DIAGNOSIS — K5901 Slow transit constipation: Secondary | ICD-10-CM | POA: Diagnosis not present

## 2021-03-17 DIAGNOSIS — I10 Essential (primary) hypertension: Secondary | ICD-10-CM | POA: Diagnosis not present

## 2021-03-17 DIAGNOSIS — E782 Mixed hyperlipidemia: Secondary | ICD-10-CM

## 2021-03-17 DIAGNOSIS — E039 Hypothyroidism, unspecified: Secondary | ICD-10-CM

## 2021-03-17 DIAGNOSIS — G301 Alzheimer's disease with late onset: Secondary | ICD-10-CM

## 2021-03-17 DIAGNOSIS — M1712 Unilateral primary osteoarthritis, left knee: Secondary | ICD-10-CM | POA: Diagnosis not present

## 2021-03-17 DIAGNOSIS — F0281 Dementia in other diseases classified elsewhere with behavioral disturbance: Secondary | ICD-10-CM

## 2021-03-17 MED ORDER — DOCUSATE SODIUM 100 MG PO CAPS: 100.0000 mg | ORAL_CAPSULE | Freq: Every day | ORAL | 0 refills | Status: AC

## 2021-03-17 NOTE — Progress Notes (Signed)
Provider: Marlowe Sax FNP-C   Eniola Cerullo, Nelda Bucks, NP  Patient Care Team: Atharva Mirsky, Nelda Bucks, NP as PCP - General (Family Medicine) [name], MD as Consulting Physician (Gastroenterology) Stanford Breed Denice Bors, MD as Consulting Physician (Cardiology) Druscilla Brownie, MD as Consulting Physician (Dermatology) [name], MD as Consulting Physician (Ophthalmology)  Extended Emergency Contact Information Primary Emergency Contact: Causey,Edna Address: [address]          [place] [address] [name] of [place] Phone: [phone] Relation: Friend Secondary Emergency Contact: Cade, [place] Phone: [phone] Relation: Sister  Code Status:  Full Code  Goals of care: Advanced Directive information Advanced Directives 03/17/2021  Does Patient Have a Medical Advance Directive? Yes  Type of Paramedic of [place];Living will;Out of facility DNR (pink MOST or yellow form)  Does patient want to make changes to medical advance directive? No - Patient declined  Copy of [place] in Chart? Yes - validated most recent copy scanned in chart (See row information)  Pre-existing out of facility DNR order (yellow form or pink MOST form) Yellow form placed in chart (order not valid for inpatient use)     Chief Complaint  Patient presents with   Medical Management of Chronic Issues    6 month follow up     HPI:  Pt is a 87 y.o. female seen today for 6 months for medical management of chronic diseases.Has a medical history Hypertension,Hyperlipidemia GERD,seasonal allergies,Osteoarthritis,Dementia with behavioral disturbances.she is here with her Nephew who is the primary care giver.she continues to require total care assistance with her ADL's.   States pain on left  is under controlled.   Has had issues with constipation.Bowels move but sometimes has round hard stool.Nephew afraid  to give Miralax on a daily basis due to fear of her having diarrhea.does not drink much water. Has had 10  lbs weight loss since last visit 7 months ago.Her appetite is described as fair tends to eat two meals.    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)    Obesity    Oral aphthae    Osteoarthrosis, unspecified whether generalized or localized, unspecified site    Other abnormal blood chemistry    Other voice and resonance disorders    Pain in joint, pelvic region and thigh    Palpitations    Pancreatitis    Peripheral vascular disease, unspecified ([place])    Schatzki's ring    Senile osteoporosis    Torus fracture of fibula    Unspecified constipation    Unspecified hypothyroidism    Past Surgical History:  Procedure Laterality Date   APPENDECTOMY     BASAL CELL CARCINOMA EXCISION     right humerus, right temple, left side of nose   BASAL CELL CARCINOMA EXCISION  chest, left cheek   CATARACT EXTRACTION, BILATERAL     CHOLECYSTECTOMY     keratosis     left arm   resection of ampullary tumor     squamous cell cancer     Right leg   squamous cell cancer left triceps Left    Dr. Lorenza [place]   TONSILLECTOMY AND ADENOIDECTOMY      Allergies  Allergen Reactions   Penicillins Other (See Comments)    Exact reaction not recalled- has not been taken "in a long time" Has patient had a PCN reaction causing immediate rash, facial/tongue/throat  swelling, SOB or lightheadedness with hypotension: yes Has patient had a PCN reaction causing severe rash involving mucus membranes or skin necrosis: no Has patient had a PCN reaction that required hospitalization: no Has patient had a PCN reaction occurring within the last 10 years: no If all of the above answers are "NO", then may proceed with Cephalosporin    Codeine Nausea And Vomiting   Lodine [Etodolac] Nausea And Vomiting   Wild Lettuce [Lactuca Virosa] Diarrhea    Allergies as of 03/17/2021       Reactions   Penicillins Other (See Comments)   Exact reaction not recalled- has not been taken "in a long time" Has patient had a PCN reaction causing immediate rash, facial/tongue/throat swelling, SOB or lightheadedness with hypotension: yes Has patient had a PCN reaction causing severe rash involving mucus membranes or skin necrosis: no Has patient had a PCN reaction that required hospitalization: no Has patient had a PCN reaction occurring within the last 10 years: no If all of the above answers are "NO", then may proceed with Cephalosporin    Codeine Nausea And Vomiting   Lodine [etodolac] Nausea And Vomiting   Wild Lettuce [lactuca Virosa] Diarrhea        Medication List        Accurate as of March 17, 2021 11:04 AM. If you have any questions, ask your nurse or doctor.          acetaminophen 500 MG tablet Commonly known as: TYLENOL Take 500 mg by mouth in the morning, at noon, and at bedtime.   alendronate 70 MG tablet Commonly known as: FOSAMAX TAKE 1 TABLET BY MOUTH ONCE A WEEK. TAKE WITH A FULL GLASS OF WATER ON AN EMPTY STOMACH.   aspirin EC 81 MG tablet Take 81 mg by mouth daily.   bimatoprost 0.01 % Soln Commonly known as: LUMIGAN Place 1 drop into both eyes at bedtime.   CALCIUM 600+D3 PO Take 2 tablets by mouth every evening.   celecoxib 100 MG capsule Commonly known as: CELEBREX TAKE 1 CAPSULE (100 MG TOTAL) BY MOUTH 2 (TWO) TIMES DAILY. ONE IN THE  MORNING, AND ONE IN THE AFTERNOON.   DULoxetine 60 MG capsule Commonly known as: CYMBALTA Take 1 capsule (60 mg total) by mouth at bedtime.   FIBER CHOICE PO Take 5 g by mouth daily as needed (to improve digestive health- mix and drink).   Fish Oil 1200 MG Caps Take 1,200 mg by mouth 3 (three) times daily.   fluticasone 50 MCG/ACT nasal spray Commonly known as: FLONASE Place 1 spray into both nostrils daily as needed for allergies or rhinitis.   gabapentin 100 MG capsule Commonly known as: NEURONTIN Take 100 mg by mouth in the morning.   gabapentin 300 MG capsule Commonly known as: NEURONTIN TAKE ONE CAPSULE BY MOUTH ONCE DAILY AT BEDTIME   levothyroxine 50 MCG tablet Commonly known  as: SYNTHROID TAKE 1 TABLET BY MOUTH DAILY BEFORE BREAKFAST. ON EMPTY STOMACH 30 MINUTES BEFORE EATING.   multivitamin with minerals Tabs tablet Take 1 tablet by mouth daily with lunch.   nystatin powder Commonly known as: MYCOSTATIN/NYSTOP Apply 1 application topically in the morning, at noon, in the evening, and at bedtime. Under Breast.   omeprazole 20 MG capsule Commonly known as: PRILOSEC TAKE 1 CAPSULE BY MOUTH ONCE DAILY TO REDUCE STOMACH ACID   QUEtiapine 25 MG tablet Commonly known as: SEROQUEL Take one tablet by mouth once daily at bedtime.   SYSTANE ULTRA OP Place 1 drop into both eyes 3 (three) times daily as needed (dry eyes).   traMADol 50 MG tablet Commonly known as: ULTRAM TAKE 1 TABLET(50 MG) BY MOUTH AT BEDTIME   Vitamin D3 50 MCG (2000 UT) capsule Take 2,000 Units by mouth daily.        Review of Systems  Unable to perform ROS: Dementia (additional information provided by Nephew)  Constitutional:  Negative for appetite change, chills, fatigue, fever and unexpected weight change.  HENT:  Negative for congestion, dental problem, ear discharge, ear pain, facial swelling, hearing loss, nosebleeds, postnasal drip, rhinorrhea, sinus pressure, sinus pain, sneezing,  sore throat, tinnitus and trouble swallowing.   Eyes:  Negative for pain, discharge, redness, itching and visual disturbance.  Respiratory:  Negative for cough, chest tightness, shortness of breath and wheezing.   Cardiovascular:  Negative for chest pain, palpitations and leg swelling.  Gastrointestinal:  Positive for constipation. Negative for abdominal distention, abdominal pain, blood in stool, diarrhea, nausea and vomiting.  Endocrine: Negative for cold intolerance, heat intolerance, polydipsia, polyphagia and polyuria.  Genitourinary:  Negative for difficulty urinating, dysuria and flank pain.       Incontinent   Musculoskeletal:  Positive for arthralgias. Negative for back pain, gait problem, joint swelling, myalgias, neck pain and neck stiffness.       Left knee pain   Skin:  Negative for color change, pallor, rash and wound.  Neurological:  Negative for dizziness, syncope, speech difficulty, weakness, light-headedness, numbness and headaches.  Hematological:  Does not bruise/bleed easily.  Psychiatric/Behavioral:  Negative for agitation, hallucinations and sleep disturbance. The patient is not nervous/anxious.        Memory loss    Immunization History  Administered Date(s) Administered   Fluad Quad(high Dose 65+) 06/04/2019   Influenza, High Dose Seasonal PF 05/09/2017, 06/19/2018, 06/11/2020   Influenza,inj,Quad PF,6+ Mos 06/17/2013, 06/23/2014, 09/07/2015, 07/04/2016   Influenza-Unspecified 07/09/2012   PFIZER(Purple Top)SARS-COV-2 Vaccination 11/02/2019, 11/23/2019   Pneumococcal Conjugate-13 07/11/2018   Pneumococcal Polysaccharide-23 07/04/2016   Pneumococcal-Unspecified 09/10/1992   Tdap 09/11/2007, 07/11/2018   Zoster Recombinat (Shingrix) 07/09/2017, 10/23/2017   Zoster, Live 11/28/2010   Pertinent  Health Maintenance Due  Topic Date Due   INFLUENZA VACCINE  04/10/2021   DEXA SCAN  Completed   PNA vac Low Risk Adult  Completed   Fall Risk  03/17/2021 09/16/2020  07/15/2020 03/11/2020 03/01/2020  Falls in the past year? 0 0 1 0 1  Number falls in past yr: 0 0 0 0 1  Injury with Fall? 0 0 0 0 1  Comment - - - - -  Risk Factor Category  - - - - -  Risk for fall due to : No Fall Risks - - - History of fall(s);Impaired balance/gait;Impaired mobility;Mental status change;Orthopedic patient  Follow up Falls evaluation completed - - - Education provided;Falls prevention discussed   Functional Status Survey:  Vitals:   03/17/21 1050  BP: 138/72  Pulse: 90  Resp: 20  Temp: (!) 97.1 F (36.2 C)  SpO2: 95%  Weight: 150 lb 1.6 oz (68.1 kg)  Height: 5' 3"  (1.6 m)   Body mass index is 26.59 kg/m. Physical Exam Vitals reviewed.  Constitutional:      General: She is not in acute distress.    Appearance: Normal appearance. She is overweight. She is not ill-appearing or diaphoretic.  HENT:     Head: Normocephalic.     Right Ear: Tympanic membrane, ear canal and external ear normal. There is no impacted cerumen.     Left Ear: Tympanic membrane, ear canal and external ear normal. There is no impacted cerumen.     Nose: Nose normal. No congestion or rhinorrhea.     Mouth/Throat:     Mouth: Mucous membranes are moist.     Pharynx: Oropharynx is clear. No oropharyngeal exudate or posterior oropharyngeal erythema.  Eyes:     General: No scleral icterus.       Right eye: No discharge.        Left eye: No discharge.     Extraocular Movements: Extraocular movements intact.     Conjunctiva/sclera: Conjunctivae normal.     Pupils: Pupils are equal, round, and reactive to light.  Neck:     Vascular: No carotid bruit.  Cardiovascular:     Rate and Rhythm: Normal rate and regular rhythm.     Pulses: Normal pulses.     Heart sounds: Normal heart sounds. No murmur heard.   No friction rub. No gallop.  Pulmonary:     Effort: Pulmonary effort is normal. No respiratory distress.     Breath sounds: Normal breath sounds. No wheezing, rhonchi or rales.  Chest:      Chest wall: No tenderness.  Abdominal:     General: Bowel sounds are normal. There is no distension.     Palpations: Abdomen is soft. There is no mass.     Tenderness: There is no abdominal tenderness. There is no right CVA tenderness, left CVA tenderness, guarding or rebound.  Musculoskeletal:        General: No swelling or tenderness.     Cervical back: Normal range of motion. No rigidity or tenderness.     Right lower leg: No edema.     Left lower leg: No edema.     Comments: Unsteady gait walks with Rolling walker   Lymphadenopathy:     Cervical: No cervical adenopathy.  Skin:    General: Skin is warm and dry.     Coloration: Skin is not pale.     Findings: No bruising, erythema, lesion or rash.  Neurological:     Mental Status: She is alert. Mental status is at baseline.     Cranial Nerves: No cranial nerve deficit.     Sensory: No sensory deficit.     Motor: No weakness.     Coordination: Coordination normal.     Gait: Gait abnormal.  Psychiatric:        Mood and Affect: Mood normal.        Speech: Speech normal.        Behavior: Behavior normal.        Thought Content: Thought content normal.        Cognition and Memory: Memory is impaired. She exhibits impaired recent memory.        Judgment: Judgment normal.    Labs reviewed: Recent Labs  03/18/20 1032 09/16/20 1107  NA 144 143  K 4.4 4.6  CL 107 106  CO2 27 28  GLUCOSE 99 98  BUN 29* 25  CREATININE 0.68 0.81  CALCIUM 9.8 9.7   Recent Labs    03/18/20 1032 09/16/20 1107  AST 18 20  ALT 15 19  BILITOT 0.4 0.4  PROT 7.0 7.1   Recent Labs    03/18/20 1032 09/16/20 1107  WBC 5.8 6.4  NEUTROABS 2,587 3,584  HGB 12.4 12.6  HCT 37.6 37.6  MCV 97.9 96.2  PLT 177 170   Lab Results  Component Value Date   TSH 3.21 09/16/2020   Lab Results  Component Value Date   HGBA1C 5.3 07/01/2017   Lab Results  Component Value Date   CHOL 231 (H) 03/18/2020   HDL 56 03/18/2020   LDLCALC 150 (H)  03/18/2020   LDLDIRECT 113.2 10/18/2006   TRIG 125 03/18/2020   CHOLHDL 4.1 03/18/2020    Significant Diagnostic Results in last 30 days:  No results found.  Assessment/Plan 1. Essential hypertension B/p well controlled  0n ASA EC 81 mg tablet daily. - CBC with Differential/Platelet - CMP with eGFR([facility]) - TSH  2. Mixed hyperlipidemia Latest LDL not at goal. Not a candidate for Statin due to her advance age and high risk for muscle weakness. - continue on Omega - 3  fatty acids  - Lipid Panel  3. Acquired hypothyroidism Lab Results  Component Value Date   TSH 3.21 09/16/2020  - continue on Levothyroxine 50 mcg tablet on an empty stomach.recheck level today. - TSH  4. Primary osteoarthritis of left knee Pain under controlled. Negative exam findings on left knee. Continue on Gabapentin ,tylenol,tramadol and Celebrex.   5. Late onset Alzheimer's disease with behavioral disturbance ([place]) No new behavioral issues reported. Continue to assist with her ADL's - continue on Seroquel for agitation   6. Slow transit constipation -- encouraged to increase fiber in diet  - increase water intake to 6-8 glasses of water daily and exercise as tolerated  - add docusate daily to soften stool.  - docusate sodium (COLACE) 100 MG capsule; Take 1 capsule (100 mg total) by mouth daily.  Dispense: 10 capsule; Refill: 0   Family/ staff Communication: Reviewed plan of care with patient and Nephew.  Labs/tests ordered:  - CBC with Differential/Platelet - CMP with eGFR([facility]) - TSH - Lipid panel  Next Appointment : 6 months for medical management of chronic issues.   Sandrea Hughs, NP

## 2021-03-18 LAB — CBC WITH DIFFERENTIAL/PLATELET
Absolute Monocytes: 598 cells/uL (ref 200–950)
Basophils Absolute: 50 cells/uL (ref 0–200)
Basophils Relative: 0.6 %
Eosinophils Absolute: 158 cells/uL (ref 15–500)
Eosinophils Relative: 1.9 %
HCT: 36.6 % (ref 35.0–45.0)
Hemoglobin: 11.8 g/dL (ref 11.7–15.5)
Lymphs Abs: 2017 cells/uL (ref 850–3900)
MCH: 31.6 pg (ref 27.0–33.0)
MCHC: 32.2 g/dL (ref 32.0–36.0)
MCV: 97.9 fL (ref 80.0–100.0)
MPV: 10.4 fL (ref 7.5–12.5)
Monocytes Relative: 7.2 %
Neutro Abs: 5478 cells/uL (ref 1500–7800)
Neutrophils Relative %: 66 %
Platelets: 191 10*3/uL (ref 140–400)
RBC: 3.74 10*6/uL — ABNORMAL LOW (ref 3.80–5.10)
RDW: 12.4 % (ref 11.0–15.0)
Total Lymphocyte: 24.3 %
WBC: 8.3 10*3/uL (ref 3.8–10.8)

## 2021-03-18 LAB — TSH: TSH: 2.43 mIU/L (ref 0.40–4.50)

## 2021-03-18 LAB — LIPID PANEL
Cholesterol: 230 mg/dL — ABNORMAL HIGH (ref <200)
HDL: 58 mg/dL (ref >=50)
LDL Cholesterol (Calc): 145 mg/dL (calc) — ABNORMAL HIGH
Non-HDL Cholesterol (Calc): 172 mg/dL (calc) — ABNORMAL HIGH (ref <130)
Total CHOL/HDL Ratio: 4 (calc) (ref <5.0)
Triglycerides: 148 mg/dL (ref <150)

## 2021-03-18 LAB — COMPLETE METABOLIC PANEL WITH GFR
AG Ratio: 1.6 (calc) (ref 1.0–2.5)
ALT: 22 U/L (ref 6–29)
AST: 20 U/L (ref 10–35)
Albumin: 4.2 g/dL (ref 3.6–5.1)
Alkaline phosphatase (APISO): 52 U/L (ref 37–153)
BUN/Creatinine Ratio: 33 (calc) — ABNORMAL HIGH (ref 6–22)
BUN: 29 mg/dL — ABNORMAL HIGH (ref 7–25)
CO2: 27 mmol/L (ref 20–32)
Calcium: 9.6 mg/dL (ref 8.6–10.4)
Chloride: 106 mmol/L (ref 98–110)
Creat: 0.87 mg/dL (ref 0.60–0.88)
GFR, Est African American: 66 mL/min/{1.73_m2} (ref >=60)
GFR, Est Non African American: 57 mL/min/{1.73_m2} — ABNORMAL LOW (ref >=60)
Globulin: 2.6 g/dL (calc) (ref 1.9–3.7)
Glucose, Bld: 104 mg/dL (ref 65–139)
Potassium: 4.4 mmol/L (ref 3.5–5.3)
Sodium: 142 mmol/L (ref 135–146)
Total Bilirubin: 0.6 mg/dL (ref 0.2–1.2)
Total Protein: 6.8 g/dL (ref 6.1–8.1)

## 2021-03-20 ENCOUNTER — Telehealth: Payer: Self-pay | Admitting: Family

## 2021-03-20 NOTE — Telephone Encounter (Signed)
Lab results : Total cholesterol and LDL ( bad cholesterol) are slightly high recommend a low saturated fats and high vegetable diet. Will start on low dose cholesterol medication if level are still high on next labs.

## 2021-03-20 NOTE — Telephone Encounter (Signed)
Message send prior to completing the rest of the labs.

## 2021-03-20 NOTE — Telephone Encounter (Signed)
Called patient and lab results were discussed and understood with patient and Son.

## 2021-03-20 NOTE — Telephone Encounter (Signed)
All patient result notes have to be sent through clinical inbasket. Not "Patient Calls". Others cannot view this. Only me. Unless someone goes into my Inbasket.

## 2021-03-23 DIAGNOSIS — L570 Actinic keratosis: Secondary | ICD-10-CM | POA: Diagnosis not present

## 2021-03-23 DIAGNOSIS — L820 Inflamed seborrheic keratosis: Secondary | ICD-10-CM | POA: Diagnosis not present

## 2021-03-23 DIAGNOSIS — C44729 Squamous cell carcinoma of skin of left lower limb, including hip: Secondary | ICD-10-CM | POA: Diagnosis not present

## 2021-03-23 DIAGNOSIS — D485 Neoplasm of uncertain behavior of skin: Secondary | ICD-10-CM | POA: Diagnosis not present

## 2021-03-23 DIAGNOSIS — L821 Other seborrheic keratosis: Secondary | ICD-10-CM | POA: Diagnosis not present

## 2021-03-23 DIAGNOSIS — L308 Other specified dermatitis: Secondary | ICD-10-CM | POA: Diagnosis not present

## 2021-03-30 ENCOUNTER — Other Ambulatory Visit: Payer: Self-pay | Admitting: Family

## 2021-03-31 NOTE — Telephone Encounter (Signed)
RX last filled on 03/01/21, patient was recently seen, yet no treatment agreement on file. Patient does not have a pending in office appointment. Provider will need to decide if ok to mail a treatment agreement to patient or if patient needs another appointment (in person) to review and sign.

## 2021-04-07 ENCOUNTER — Other Ambulatory Visit: Payer: Self-pay | Admitting: *Deleted

## 2021-04-07 MED ORDER — CELECOXIB 100 MG PO CAPS: 100.0000 mg | ORAL_CAPSULE | Freq: Two times a day (BID) | ORAL | 1 refills | Status: AC

## 2021-04-07 NOTE — Telephone Encounter (Signed)
Pharmacy requested refill.  Pended Rx and sent to [facility] for approval due to HIGH ALERT Warning.  

## 2021-04-19 DIAGNOSIS — H0100B Unspecified blepharitis left eye, upper and lower eyelids: Secondary | ICD-10-CM | POA: Diagnosis not present

## 2021-04-19 DIAGNOSIS — H18593 Other hereditary corneal dystrophies, bilateral: Secondary | ICD-10-CM | POA: Diagnosis not present

## 2021-04-19 DIAGNOSIS — H0100A Unspecified blepharitis right eye, upper and lower eyelids: Secondary | ICD-10-CM | POA: Diagnosis not present

## 2021-04-19 DIAGNOSIS — H401134 Primary open-angle glaucoma, bilateral, indeterminate stage: Secondary | ICD-10-CM | POA: Diagnosis not present

## 2021-04-26 ENCOUNTER — Encounter: Payer: Self-pay | Admitting: Podiatry

## 2021-04-26 ENCOUNTER — Other Ambulatory Visit: Payer: Self-pay

## 2021-04-26 ENCOUNTER — Ambulatory Visit (INDEPENDENT_AMBULATORY_CARE_PROVIDER_SITE_OTHER): Payer: Medicare Other | Admitting: Podiatry

## 2021-04-26 DIAGNOSIS — M79675 Pain in left toe(s): Secondary | ICD-10-CM

## 2021-04-26 DIAGNOSIS — M79674 Pain in right toe(s): Secondary | ICD-10-CM | POA: Diagnosis not present

## 2021-04-26 DIAGNOSIS — I739 Peripheral vascular disease, unspecified: Secondary | ICD-10-CM | POA: Diagnosis not present

## 2021-04-26 DIAGNOSIS — B351 Tinea unguium: Secondary | ICD-10-CM

## 2021-04-27 ENCOUNTER — Encounter: Payer: Self-pay | Admitting: Podiatry

## 2021-04-27 NOTE — Progress Notes (Signed)
Subjective:  Patient ID: Tonya Russell, female    DOB: Jul 18, 1925,  MRN: [id number]  Chief Complaint  Patient presents with   Nail Problem    Nail trim    87 y.o. female returns for the above complaint.  Patient presents with thickened elongated dystrophic toenails x10.  Patient states is painful to touch.  She has a hard time ambulating and cutting them down herself.  She would like for me to help them debrided down.  She denies being a diabetic however she does have a mild component of peripheral vascular disease.  She denies any secondary complaints  Objective:  There were no vitals filed for this visit. Podiatric Exam: Vascular: Decreased dorsalis pedis and posterior tibial pulses are faintly palpable bilateral. Capillary return is sluggish.  Bluish discoloration noted to the distal tip of the toes without any signs of infection/gangrene. Sensorium: Normal Semmes Weinstein monofilament test. Normal tactile sensation bilaterally. Nail Exam: Pt has thick disfigured discolored nails with subungual debris noted bilateral entire nail hallux through fifth toenails.  Pain on palpation to the nails. Ulcer Exam: There is no evidence of ulcer or pre-ulcerative changes or infection. Orthopedic Exam: Muscle tone and strength are WNL. No limitations in general ROM. No crepitus or effusions noted.  Skin: No Porokeratosis. No infection or ulcers.  Hyperkeratotic lesion with pain on palpation left submetatarsal 1 noted.  Slight plantarflexed first metatarsal 1 noted.  Excessive stress on the sesamoid complex noted.  Pain with range of motion left first MPJ.  Intra-articular pain noted.    Assessment & Plan:   1. Pain due to onychomycosis of toenails of both feet   2. Peripheral vascular disease, unspecified ([place])      Patient was evaluated and treated and all questions answered.  Raynaud's phenomenon to bilateral toes without gangrene -I explained to the patient the etiology of Raynaud's  phenomenon and various treatment options were discussed.  I discussed with her that even though she does not have any autoimmune diseases this could be primary Raynaud's phenomenon that can lead to discoloration of the digits especially during weather changes.  Patient states understanding.  At this time will consider monitoring it and if there is any acute signs of gangrene or any other vascular compromise we will discuss treatment options at that time.  Patient states understanding  Left first metatarsophalangeal joint capsulitis -I explained the patient the etiology of capsulitis of the metatarsophalangeal joint and various treatment options were discussed.  Given that she is putting a great aggressive pressure on the left first metatarsophalangeal joint.  I believe patient will benefit from steroid injection to help decrease acute inflammatory component associated pain.  Patient agrees with the plan like to proceed with a steroid injection. -A steroid injection was performed at Left first MPJ using 1% plain Lidocaine and 10 mg of Kenalog. This was well tolerated.   Onychomycosis with pain  -Nails palliatively debrided as below. -Educated on self-care  Procedure: Nail Debridement Rationale: pain  Type of Debridement: manual, sharp debridement. Instrumentation: Nail nipper, rotary burr. Number of Nails: 10  Procedures and Treatment: Consent by patient was obtained for treatment procedures. The patient understood the discussion of treatment and procedures well. All questions were answered thoroughly reviewed. Debridement of mycotic and hypertrophic toenails, 1 through 5 bilateral and clearing of subungual debris. No ulceration, no infection noted.  Return Visit-Office Procedure: Patient instructed to return to the office for a follow up visit 3 months for continued evaluation and treatment.  Tonya Russell  Tonya Russell, DPM    Return in about 3 months (around 07/27/2021) for [employer].

## 2021-04-29 ENCOUNTER — Other Ambulatory Visit: Payer: Self-pay | Admitting: Family

## 2021-05-01 NOTE — Telephone Encounter (Signed)
1.) RX last refilled on 03/31/21  2.) No treatment agreement on file  3.) Next in person appointment is pending for 09/21/2021, please advise if patient needs to have an appt prior to review medications and sign non opioid treatment agreement

## 2021-05-23 ENCOUNTER — Other Ambulatory Visit: Payer: Self-pay | Admitting: Family

## 2021-05-26 ENCOUNTER — Other Ambulatory Visit: Payer: Self-pay

## 2021-05-26 MED ORDER — TRAMADOL HCL 50 MG PO TABS: ORAL_TABLET | ORAL | 0 refills | Status: DC

## 2021-05-26 NOTE — Telephone Encounter (Signed)
[name], the patient's nephew/care giver called stating her Tramadol prescription was denied and he need it sent to [facility]. To [employer]

## 2021-06-15 DIAGNOSIS — Z23 Encounter for immunization: Secondary | ICD-10-CM | POA: Diagnosis not present

## 2021-06-18 ENCOUNTER — Other Ambulatory Visit: Payer: Self-pay | Admitting: Nurse Practitioner

## 2021-06-18 DIAGNOSIS — G301 Alzheimer's disease with late onset: Secondary | ICD-10-CM

## 2021-06-18 DIAGNOSIS — F22 Delusional disorders: Secondary | ICD-10-CM

## 2021-06-19 ENCOUNTER — Other Ambulatory Visit: Payer: Self-pay | Admitting: *Deleted

## 2021-06-19 DIAGNOSIS — G301 Alzheimer's disease with late onset: Secondary | ICD-10-CM

## 2021-06-19 DIAGNOSIS — F22 Delusional disorders: Secondary | ICD-10-CM

## 2021-06-19 MED ORDER — QUETIAPINE FUMARATE 25 MG PO TABS: ORAL_TABLET | ORAL | 1 refills | Status: AC

## 2021-06-19 NOTE — Telephone Encounter (Signed)
Patient requested refill

## 2021-06-27 ENCOUNTER — Other Ambulatory Visit: Payer: Self-pay | Admitting: Family

## 2021-06-28 NOTE — Telephone Encounter (Signed)
Patient has request refill on medication "Tramadol". Patient last refill for medication was 05/26/2021. Patient doesn't have non opioid treatment agreement on file. Medication pend and sent to PCP Ngetich, Nelda Bucks, NP for approval.

## 2021-06-29 DIAGNOSIS — D485 Neoplasm of uncertain behavior of skin: Secondary | ICD-10-CM | POA: Diagnosis not present

## 2021-06-29 DIAGNOSIS — C44729 Squamous cell carcinoma of skin of left lower limb, including hip: Secondary | ICD-10-CM | POA: Diagnosis not present

## 2021-06-29 DIAGNOSIS — L570 Actinic keratosis: Secondary | ICD-10-CM | POA: Diagnosis not present

## 2021-06-29 DIAGNOSIS — C44722 Squamous cell carcinoma of skin of right lower limb, including hip: Secondary | ICD-10-CM | POA: Diagnosis not present

## 2021-07-03 ENCOUNTER — Other Ambulatory Visit: Payer: Self-pay | Admitting: Nurse Practitioner

## 2021-07-03 NOTE — Telephone Encounter (Signed)
High risk or very high risk warning populated when attempting to refill medication. RX request sent to PCP for review and approval if warranted.   

## 2021-07-10 ENCOUNTER — Other Ambulatory Visit: Payer: Self-pay | Admitting: Family

## 2021-07-12 ENCOUNTER — Other Ambulatory Visit: Payer: Self-pay

## 2021-07-12 ENCOUNTER — Encounter: Payer: Self-pay | Admitting: Family

## 2021-07-12 ENCOUNTER — Ambulatory Visit (INDEPENDENT_AMBULATORY_CARE_PROVIDER_SITE_OTHER): Payer: Medicare Other | Admitting: Family

## 2021-07-12 VITALS — BP 100/80 | HR 62 | Temp 96.9°F | Resp 16 | Ht 63.0 in | Wt 141.6 lb

## 2021-07-12 DIAGNOSIS — R5381 Other malaise: Secondary | ICD-10-CM

## 2021-07-12 DIAGNOSIS — Z111 Encounter for screening for respiratory tuberculosis: Secondary | ICD-10-CM

## 2021-07-12 DIAGNOSIS — R32 Unspecified urinary incontinence: Secondary | ICD-10-CM

## 2021-07-12 DIAGNOSIS — M810 Age-related osteoporosis without current pathological fracture: Secondary | ICD-10-CM

## 2021-07-12 DIAGNOSIS — E782 Mixed hyperlipidemia: Secondary | ICD-10-CM | POA: Diagnosis not present

## 2021-07-12 DIAGNOSIS — G301 Alzheimer's disease with late onset: Secondary | ICD-10-CM

## 2021-07-12 DIAGNOSIS — R2681 Unsteadiness on feet: Secondary | ICD-10-CM | POA: Diagnosis not present

## 2021-07-12 DIAGNOSIS — R296 Repeated falls: Secondary | ICD-10-CM

## 2021-07-12 DIAGNOSIS — I739 Peripheral vascular disease, unspecified: Secondary | ICD-10-CM | POA: Diagnosis not present

## 2021-07-12 DIAGNOSIS — K219 Gastro-esophageal reflux disease without esophagitis: Secondary | ICD-10-CM

## 2021-07-12 DIAGNOSIS — Z0289 Encounter for other administrative examinations: Secondary | ICD-10-CM

## 2021-07-12 DIAGNOSIS — I10 Essential (primary) hypertension: Secondary | ICD-10-CM

## 2021-07-12 DIAGNOSIS — E039 Hypothyroidism, unspecified: Secondary | ICD-10-CM

## 2021-07-12 DIAGNOSIS — M1712 Unilateral primary osteoarthritis, left knee: Secondary | ICD-10-CM

## 2021-07-12 DIAGNOSIS — R634 Abnormal weight loss: Secondary | ICD-10-CM

## 2021-07-12 DIAGNOSIS — F02818 Dementia in other diseases classified elsewhere, unspecified severity, with other behavioral disturbance: Secondary | ICD-10-CM

## 2021-07-12 NOTE — Progress Notes (Signed)
Provider: Marlowe Sax FNP-C   Marabella Popiel, Nelda Bucks, NP  Patient Care Team: Wallace Cogliano, Nelda Bucks, NP as PCP - General (Family Medicine) [name], MD as Consulting Physician (Gastroenterology) Stanford Breed Denice Bors, MD as Consulting Physician (Cardiology) Druscilla Brownie, MD as Consulting Physician (Dermatology) [name], MD as Consulting Physician (Ophthalmology)  Extended Emergency Contact Information Primary Emergency Contact: Causey,Edna Address: [address]          [place] [address] [name] of [place] Phone: [phone] Relation: Friend Secondary Emergency Contact: Protivin, [place] of [place] Phone: [phone] Relation: Sister  Code Status:  Full Code  Goals of care: Advanced Directive information Advanced Directives 03/17/2021  Does Patient Have a Medical Advance Directive? Yes  Type of Paramedic of [place];Living will;Out of facility DNR (pink MOST or yellow form)  Does patient want to make changes to medical advance directive? No - Patient declined  Copy of [place] in Chart? Yes - validated most recent copy scanned in chart (See row information)  Pre-existing out of facility DNR order (yellow form or pink MOST form) Yellow form placed in chart (order not valid for inpatient use)     Chief Complaint  Patient presents with   Acute Visit    Requesting FL2 for long term care facility.Patient has had biopsies done on legs. Patient fell last week and bruised right shoulder.    HPI:  Pt is a 87 y.o. female seen today to complete FL 2 to transfer to higher level of care .Has medical history of Hypertension,Hyperlipidemia,GERD,seasonal allergies,Osteoarthritis,Dementia with behavioral disturbances among others.  she is here with her Nephew who provides HPI information. Nephew states plans to move patient to [place] at [facility] in [place]  in [place].Her dementia has continued to worsen especially after sunset.she has had multiple falls last episode on 07/07/2021 fell off the bed and hit her right arm.she was evaluated by EMS but had no injuries did not go to ED.   Her appetite has decreased.she will eat only two meals mostly good breakfast and dinner then sometimes snacks during lunch. Has had progressive weight loss 8.4 lbs over three months since last visit.  She is incontinent of urine and wears adult size pull ups but continent for bowels.   She requires total care assistance with her ADL's except feeding.She continues to walk with a walker with standby assistance.   Left knee pain has improved with current pain regimen.  She was seen by dermatologist had several lesion on both lower extremities which indicated squamous cell Carcinoma.she continues to follow up with dermatologist.Currently on Fluoroucil 5 % topical cream twice daily.    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[place])    Obesity    Oral aphthae    Osteoarthrosis, unspecified whether  generalized or localized, unspecified site    Other abnormal blood chemistry    Other voice and resonance disorders    Pain in joint, pelvic region and thigh    Palpitations    Pancreatitis    Peripheral vascular disease, unspecified (HCC)    Schatzki's ring    Senile osteoporosis    Torus fracture of fibula    Unspecified constipation     Unspecified hypothyroidism    Past Surgical History:  Procedure Laterality Date   APPENDECTOMY     BASAL CELL CARCINOMA EXCISION     right humerus, right temple, left side of nose   BASAL CELL CARCINOMA EXCISION     chest, left cheek   CATARACT EXTRACTION, BILATERAL     CHOLECYSTECTOMY     keratosis     left arm   resection of ampullary tumor     squamous cell cancer     Right leg   squamous cell cancer left triceps Left    Dr. Lorenza [place]   TONSILLECTOMY AND ADENOIDECTOMY      Allergies  Allergen Reactions   Penicillins Other (See Comments)    Exact reaction not recalled- has not been taken "in a long time" Has patient had a PCN reaction causing immediate rash, facial/tongue/throat swelling, SOB or lightheadedness with hypotension: yes Has patient had a PCN reaction causing severe rash involving mucus membranes or skin necrosis: no Has patient had a PCN reaction that required hospitalization: no Has patient had a PCN reaction occurring within the last 10 years: no If all of the above answers are "NO", then may proceed with Cephalosporin    Codeine Nausea And Vomiting   Lodine [Etodolac] Nausea And Vomiting   Wild Lettuce [Lactuca Virosa] Diarrhea    Allergies as of 07/12/2021       Reactions   Penicillins Other (See Comments)   Exact reaction not recalled- has not been taken "in a long time" Has patient had a PCN reaction causing immediate rash, facial/tongue/throat swelling, SOB or lightheadedness with hypotension: yes Has patient had a PCN reaction causing severe rash involving mucus membranes or skin necrosis: no Has patient had a PCN reaction that required hospitalization: no Has patient had a PCN reaction occurring within the last 10 years: no If all of the above answers are "NO", then may proceed with Cephalosporin    Codeine Nausea And Vomiting   Lodine [etodolac] Nausea And Vomiting   Wild Lettuce [lactuca Virosa] Diarrhea        Medication List         Accurate as of July 12, 2021  2:04 PM. If you have any questions, ask your nurse or doctor.          acetaminophen 500 MG tablet Commonly known as: TYLENOL Take 500 mg by mouth in the morning, at noon, and at bedtime.   alendronate 70 MG tablet Commonly known as: FOSAMAX TAKE 1 TABLET BY MOUTH ONCE A WEEK. TAKE WITH A FULL GLASS OF WATER ON AN EMPTY STOMACH.   aspirin EC 81 MG tablet Take 81 mg by mouth daily.   bimatoprost 0.01 % Soln Commonly known as: LUMIGAN Place 1 drop into both eyes at bedtime.   CALCIUM 600+D3 PO Take 2 tablets by mouth every evening.   celecoxib 100 MG capsule Commonly known as: CELEBREX Take 1 capsule (100 mg total) by mouth 2 (two) times daily. One in the morning, and one in the afternoon.   docusate sodium 100 MG capsule Commonly  known as: Colace Take 1 capsule (100 mg total) by mouth daily.   DULoxetine 60 MG capsule Commonly known as: CYMBALTA TAKE 1 CAPSULE(60 MG) BY MOUTH AT BEDTIME   FIBER CHOICE PO Take 5 g by mouth daily as needed (to improve digestive health- mix and drink).   Fish Oil 1200 MG Caps Take 1,200 mg by mouth 3 (three) times daily.   fluorouracil 5 % cream Commonly known as: EFUDEX SMARTSIG:Sparingly Topical Twice Daily PRN   fluticasone 50 MCG/ACT nasal spray Commonly known as: FLONASE Place 1 spray into both nostrils daily as needed for allergies or rhinitis.   gabapentin 100 MG capsule Commonly known as: NEURONTIN Take 100 mg by mouth in the morning.   gabapentin 300 MG capsule Commonly known as: NEURONTIN TAKE ONE CAPSULE BY MOUTH ONCE DAILY AT BEDTIME   levothyroxine 50 MCG tablet Commonly known as: SYNTHROID TAKE 1 TABLET BY MOUTH EVERY MORNING ON EMPTY STOMACH 30 MINUTES BEFORE BREAKFAST   multivitamin with minerals Tabs tablet Take 1 tablet by mouth daily with lunch.   nystatin powder Commonly known as: MYCOSTATIN/NYSTOP Apply 1 application topically in the morning, at noon, in the evening,  and at bedtime. Under Breast.   omeprazole 20 MG capsule Commonly known as: PRILOSEC TAKE 1 CAPSULE BY MOUTH EVERY DAY FOR STOMACH ACID   QUEtiapine 25 MG tablet Commonly known as: SEROQUEL Take one tablet by mouth once daily at bedtime.   SYSTANE ULTRA OP Place 1 drop into both eyes 3 (three) times daily as needed (dry eyes).   traMADol 50 MG tablet Commonly known as: ULTRAM TAKE 1 TABLET(50 MG) BY MOUTH AT BEDTIME   Vitamin D3 50 MCG (2000 UT) capsule Take 2,000 Units by mouth daily.        Review of Systems  Unable to perform ROS: Dementia (additional information provided by POA)  Constitutional:  Positive for unexpected weight change. Negative for appetite change, chills, fatigue and fever.       Weight loss   HENT:  Negative for congestion, dental problem, ear discharge, ear pain, facial swelling, hearing loss, nosebleeds, postnasal drip, rhinorrhea, sinus pressure, sinus pain, sneezing, sore throat, tinnitus and trouble swallowing.   Eyes:  Positive for visual disturbance. Negative for pain, discharge, redness and itching.       Wears eye glasses   Respiratory:  Negative for cough, chest tightness, shortness of breath and wheezing.   Cardiovascular:  Negative for chest pain, palpitations and leg swelling.  Gastrointestinal:  Negative for abdominal distention, abdominal pain, blood in stool, constipation, diarrhea, nausea and vomiting.  Endocrine: Negative for cold intolerance, heat intolerance, polydipsia, polyphagia and polyuria.  Genitourinary:  Negative for difficulty urinating, dysuria, flank pain and urgency.       Incontinent   Musculoskeletal:  Positive for arthralgias and gait problem. Negative for back pain, joint swelling, myalgias, neck pain and neck stiffness.  Skin:  Negative for color change, pallor, rash and wound.       Right arm bruise   Neurological:  Negative for dizziness, syncope, speech difficulty, weakness, light-headedness, numbness and headaches.   Hematological:  Does not bruise/bleed easily.  Psychiatric/Behavioral:  Negative for agitation, behavioral problems, confusion, hallucinations, self-injury, sleep disturbance and suicidal ideas. The patient is not nervous/anxious.        Memory loss    Immunization History  Administered Date(s) Administered   Fluad Quad(high Dose 65+) 06/04/2019   Influenza, High Dose Seasonal PF 05/09/2017, 06/19/2018, 06/11/2020   Influenza,inj,Quad PF,6+ Mos 06/17/2013,  06/23/2014, 09/07/2015, 07/04/2016   Influenza-Unspecified 07/09/2012   PFIZER(Purple Top)SARS-COV-2 Vaccination 11/02/2019, 11/23/2019   Pneumococcal Conjugate-13 07/11/2018   Pneumococcal Polysaccharide-23 07/04/2016   Pneumococcal-Unspecified 09/10/1992   Tdap 09/11/2007, 07/11/2018   Zoster Recombinat (Shingrix) 07/09/2017, 10/23/2017   Zoster, Live 11/28/2010   Pertinent  Health Maintenance Due  Topic Date Due   INFLUENZA VACCINE  04/10/2021   DEXA SCAN  Completed   Fall Risk 03/11/2020 07/15/2020 09/16/2020 03/17/2021 07/12/2021  Falls in the past year? 0 1 0 0 1  Was there an injury with Fall? 0 0 0 0 0  Was there an injury with Fall? - - - - -  Fall Risk Category Calculator 0 1 0 0 2  Fall Risk Category Low Low Low Low Moderate  Patient Fall Risk Level Low fall risk Low fall risk Low fall risk Low fall risk Moderate fall risk  Patient at Risk for Falls Due to - - - No Fall Risks History of fall(s)  Fall risk Follow up - - - Falls evaluation completed Falls evaluation completed   Functional Status Survey:    Vitals:   07/12/21 1312  BP: 100/80  Pulse: 62  Resp: 16  Temp: (!) 96.9 F (36.1 C)  SpO2: 96%  Weight: 141 lb 9.6 oz (64.2 kg)  Height: 5\' 3"  (1.6 m)   Body mass index is 25.08 kg/m. Physical Exam Vitals reviewed.  Constitutional:      General: She is not in acute distress.    Appearance: Normal appearance. She is normal weight. She is not ill-appearing or diaphoretic.  HENT:     Head: Normocephalic.      Right Ear: Tympanic membrane, ear canal and external ear normal. There is no impacted cerumen.     Left Ear: Tympanic membrane, ear canal and external ear normal. There is no impacted cerumen.     Nose: Nose normal. No congestion or rhinorrhea.     Mouth/Throat:     Mouth: Mucous membranes are moist.     Pharynx: Oropharynx is clear. No oropharyngeal exudate or posterior oropharyngeal erythema.  Eyes:     General: No scleral icterus.       Right eye: No discharge.        Left eye: No discharge.     Extraocular Movements: Extraocular movements intact.     Conjunctiva/sclera: Conjunctivae normal.     Pupils: Pupils are equal, round, and reactive to light.  Neck:     Vascular: No carotid bruit.  Cardiovascular:     Rate and Rhythm: Normal rate and regular rhythm.     Pulses: Normal pulses.     Heart sounds: Normal heart sounds. No murmur heard.   No friction rub. No gallop.  Pulmonary:     Effort: Pulmonary effort is normal. No respiratory distress.     Breath sounds: Normal breath sounds. No wheezing, rhonchi or rales.  Chest:     Chest wall: No tenderness.  Abdominal:     General: Bowel sounds are normal. There is no distension.     Palpations: Abdomen is soft. There is no mass.     Tenderness: There is no abdominal tenderness. There is no right CVA tenderness, left CVA tenderness, guarding or rebound.  Musculoskeletal:        General: No swelling or tenderness. Normal range of motion.     Cervical back: Normal range of motion. No rigidity or tenderness.     Right lower leg: No edema.     Left  lower leg: No edema.  Lymphadenopathy:     Cervical: No cervical adenopathy.  Skin:    General: Skin is warm and dry.     Coloration: Skin is not pale.     Findings: No bruising, erythema, lesion or rash.  Neurological:     Mental Status: She is alert. Mental status is at baseline.     Cranial Nerves: No cranial nerve deficit.     Sensory: No sensory deficit.     Motor: No weakness.      Coordination: Coordination normal.     Gait: Gait abnormal.  Psychiatric:        Mood and Affect: Mood normal.        Speech: Speech normal.        Behavior: Behavior normal.        Thought Content: Thought content normal.        Cognition and Memory: Cognition is impaired. Memory is impaired.        Judgment: Judgment normal.    Labs reviewed: Recent Labs    09/16/20 1107 03/17/21 1148  NA 143 142  K 4.6 4.4  CL 106 106  CO2 28 27  GLUCOSE 98 104  BUN 25 29*  CREATININE 0.81 0.87  CALCIUM 9.7 9.6   Recent Labs    09/16/20 1107 03/17/21 1148  AST 20 20  ALT 19 22  BILITOT 0.4 0.6  PROT 7.1 6.8   Recent Labs    09/16/20 1107 03/17/21 1148  WBC 6.4 8.3  NEUTROABS 3,584 5,478  HGB 12.6 11.8  HCT 37.6 36.6  MCV 96.2 97.9  PLT 170 191   Lab Results  Component Value Date   TSH 2.43 03/17/2021   Lab Results  Component Value Date   HGBA1C 5.3 07/01/2017   Lab Results  Component Value Date   CHOL 230 (H) 03/17/2021   HDL 58 03/17/2021   LDLCALC 145 (H) 03/17/2021   LDLDIRECT 113.2 10/18/2006   TRIG 148 03/17/2021   CHOLHDL 4.0 03/17/2021    Significant Diagnostic Results in last 30 days:  No results found.  Assessment/Plan 1. Late onset Alzheimer's disease with behavioral disturbance ([place]) Progress decline in condition has had multiple falls and decreased appetite and weight loss  Recommend transfer to skilled Nursing facility  Continue with supportive care   2. Essential hypertension B/p stable  Off medication   3. Mixed hyperlipidemia Latest LDL not at goal  Continue on Omega -3 fatty acids   4. Acquired hypothyroidism Lab Results  Component Value Date   TSH 2.43 03/17/2021  Continue on Levothyroxine 50 mcg tablet daily on empty stomach   5. Primary osteoarthritis of left knee Controlled on Tylenol,Celebrex,Gabapentin and tramadol   6. Peripheral vascular disease, unspecified ([place]) Continue to control high risk factors   7.  Multiple falls Remains high risk for falls due to her poor cognitive impairment  Recommend transfer to high level of care in skilled Nursing facility  Has right arm purple bruise from recent rolling out of bed.  8. Encounter for completion of form with patient FL2 completed to transfer to skilled Nursing facility due to physical decondition,weight loss and progressive dementia.  Original copy given to patient's son and copy to scanned in Epic   9. Physical deconditioning Ambulates with walker but has had frequent falls including rolling out of the bed.  - transfer to SNF might benefit from Therapy   10. Screening-pulmonary TB - QuantiFERON-TB Gold Plus  11. Unsteady gait Continue  to ambulate with Rolling walker  - Fall and safety precaution   12. Incontinence of urine in female Continue with skin care  Wears adult pull ups   13. Senile osteoporosis Continue on Fosamax   14. Gastroesophageal reflux disease without esophagitis Omeprazole effective   15. Weight loss, abnormal Has had additional 8 lbs weight loss since last visit  Continue to encouraged oral intake and protein supplement   Family/ staff Communication: Reviewed plan of care with patient and Nephew verbalized understanding   Labs/tests ordered: - QuantiFERON-TB Gold Plus  Next Appointment : Has appointment 09/21/21   Sandrea Hughs, NP

## 2021-07-14 LAB — QUANTIFERON-TB GOLD PLUS
Mitogen-NIL: 8.03 IU/mL
NIL: 0.06 IU/mL
QuantiFERON-TB Gold Plus: NEGATIVE
TB1-NIL: <0 IU/mL
TB2-NIL: <0 IU/mL

## 2021-07-20 ENCOUNTER — Encounter: Payer: Self-pay | Admitting: Family

## 2021-07-20 ENCOUNTER — Ambulatory Visit (INDEPENDENT_AMBULATORY_CARE_PROVIDER_SITE_OTHER): Payer: Medicare Other | Admitting: Family

## 2021-07-20 ENCOUNTER — Other Ambulatory Visit: Payer: Self-pay

## 2021-07-20 DIAGNOSIS — Z0000 Encounter for general adult medical examination without abnormal findings: Secondary | ICD-10-CM

## 2021-07-20 NOTE — Progress Notes (Signed)
This service is provided via telemedicine  No vital signs collected/recorded due to the encounter was a telemedicine visit.   Location of patient (ex: home, work):  Home.  Patient consents to a telephone visit:  Yes  Location of the provider (ex: office, home):  [employer].   Name of any referring provider:  Ngetich, Nelda Bucks, NP   Names of all persons participating in the telemedicine service and their role in the encounter:  Patient, Sharlette Dense, Heriberto [place], [place], Ngetich, [place], NP.    Time spent on call:  8 minutes spent on the phone with Medical Assistant.

## 2021-07-20 NOTE — Patient Instructions (Signed)
Tonya Russell , Thank you for taking time to come for your Medicare Wellness Visit. I appreciate your ongoing commitment to your health goals. Please review the following plan we discussed and let me know if I can assist you in the future.   Screening recommendations/referrals: Colonoscopy N/A Mammogram N/A Bone Density : Up to date  Recommended yearly ophthalmology/optometry visit for glaucoma screening and checkup Recommended yearly dental visit for hygiene and checkup  Vaccinations: Influenza vaccine Up to date  Pneumococcal vaccine : Up to date  Tdap vaccine : Up to date  Shingles vaccine : Up to date     Advanced directives: Yes   Conditions/risks identified: Advance age female > 79 yrs   Next appointment: 1 year    Preventive Care 34 Years and Older, Female Preventive care refers to lifestyle choices and visits with your health care provider that can promote health and wellness. What does preventive care include? A yearly physical exam. This is also called an annual well check. Dental exams once or twice a year. Routine eye exams. Ask your health care provider how often you should have your eyes checked. Personal lifestyle choices, including: Daily care of your teeth and gums. Regular physical activity. Eating a healthy diet. Avoiding tobacco and drug use. Limiting alcohol use. Practicing safe sex. Taking low-dose aspirin every day. Taking vitamin and mineral supplements as recommended by your health care provider. What happens during an annual well check? The services and screenings done by your health care provider during your annual well check will depend on your age, overall health, lifestyle risk factors, and family history of disease. Counseling  Your health care provider may ask you questions about your: Alcohol use. Tobacco use. Drug use. Emotional well-being. Home and relationship well-being. Sexual activity. Eating habits. History of falls. Memory and  ability to understand (cognition). Work and work Statistician. Reproductive health. Screening  You may have the following tests or measurements: Height, weight, and BMI. Blood pressure. Lipid and cholesterol levels. These may be checked every 5 years, or more frequently if you are over 60 years old. Skin check. Lung cancer screening. You may have this screening every year starting at age 16 if you have a 30-pack-year history of smoking and currently smoke or have quit within the past 15 years. Fecal occult blood test (FOBT) of the stool. You may have this test every year starting at age 41. Flexible sigmoidoscopy or colonoscopy. You may have a sigmoidoscopy every 5 years or a colonoscopy every 10 years starting at age 72. Hepatitis C blood test. Hepatitis B blood test. Sexually transmitted disease (STD) testing. Diabetes screening. This is done by checking your blood sugar (glucose) after you have not eaten for a while (fasting). You may have this done every 1-3 years. Bone density scan. This is done to screen for osteoporosis. You may have this done starting at age 89. Mammogram. This may be done every 1-2 years. Talk to your health care provider about how often you should have regular mammograms. Talk with your health care provider about your test results, treatment options, and if necessary, the need for more tests. Vaccines  Your health care provider may recommend certain vaccines, such as: Influenza vaccine. This is recommended every year. Tetanus, diphtheria, and acellular pertussis (Tdap, Td) vaccine. You may need a Td booster every 10 years. Zoster vaccine. You may need this after age 48. Pneumococcal 13-valent conjugate (PCV13) vaccine. One dose is recommended after age 24. Pneumococcal polysaccharide (PPSV23) vaccine. One dose  is recommended after age 82. Talk to your health care provider about which screenings and vaccines you need and how often you need them. This information is  not intended to replace advice given to you by your health care provider. Make sure you discuss any questions you have with your health care provider. Document Released: 09/23/2015 Document Revised: 05/16/2016 Document Reviewed: 06/28/2015 Elsevier Interactive Patient Education  2017 [place] Prevention in the Home Falls can cause injuries. They can happen to people of all ages. There are many things you can do to make your home safe and to help prevent falls. What can I do on the outside of my home? Regularly fix the edges of walkways and driveways and fix any cracks. Remove anything that might make you trip as you walk through a door, such as a raised step or threshold. Trim any bushes or trees on the path to your home. Use bright outdoor lighting. Clear any walking paths of anything that might make someone trip, such as rocks or tools. Regularly check to see if handrails are loose or broken. Make sure that both sides of any steps have handrails. Any raised decks and porches should have guardrails on the edges. Have any leaves, snow, or ice cleared regularly. Use sand or salt on walking paths during winter. Clean up any spills in your garage right away. This includes oil or grease spills. What can I do in the bathroom? Use night lights. Install grab bars by the toilet and in the tub and shower. Do not use towel bars as grab bars. Use non-skid mats or decals in the tub or shower. If you need to sit down in the shower, use a plastic, non-slip stool. Keep the floor dry. Clean up any water that spills on the floor as soon as it happens. Remove soap buildup in the tub or shower regularly. Attach bath mats securely with double-sided non-slip rug tape. Do not have throw rugs and other things on the floor that can make you trip. What can I do in the bedroom? Use night lights. Make sure that you have a light by your bed that is easy to reach. Do not use any sheets or blankets that  are too big for your bed. They should not hang down onto the floor. Have a firm chair that has side arms. You can use this for support while you get dressed. Do not have throw rugs and other things on the floor that can make you trip. What can I do in the kitchen? Clean up any spills right away. Avoid walking on wet floors. Keep items that you use a lot in easy-to-reach places. If you need to reach something above you, use a strong step stool that has a grab bar. Keep electrical cords out of the way. Do not use floor polish or wax that makes floors slippery. If you must use wax, use non-skid floor wax. Do not have throw rugs and other things on the floor that can make you trip. What can I do with my stairs? Do not leave any items on the stairs. Make sure that there are handrails on both sides of the stairs and use them. Fix handrails that are broken or loose. Make sure that handrails are as long as the stairways. Check any carpeting to make sure that it is firmly attached to the stairs. Fix any carpet that is loose or worn. Avoid having throw rugs at the top or bottom of the stairs.  If you do have throw rugs, attach them to the floor with carpet tape. Make sure that you have a light switch at the top of the stairs and the bottom of the stairs. If you do not have them, ask someone to add them for you. What else can I do to help prevent falls? Wear shoes that: Do not have high heels. Have rubber bottoms. Are comfortable and fit you well. Are closed at the toe. Do not wear sandals. If you use a stepladder: Make sure that it is fully opened. Do not climb a closed stepladder. Make sure that both sides of the stepladder are locked into place. Ask someone to hold it for you, if possible. Clearly mark and make sure that you can see: Any grab bars or handrails. First and last steps. Where the edge of each step is. Use tools that help you move around (mobility aids) if they are needed. These  include: Canes. Walkers. Scooters. Crutches. Turn on the lights when you go into a dark area. Replace any light bulbs as soon as they burn out. Set up your furniture so you have a clear path. Avoid moving your furniture around. If any of your floors are uneven, fix them. If there are any pets around you, be aware of where they are. Review your medicines with your doctor. Some medicines can make you feel dizzy. This can increase your chance of falling. Ask your doctor what other things that you can do to help prevent falls. This information is not intended to replace advice given to you by your health care provider. Make sure you discuss any questions you have with your health care provider. Document Released: 06/23/2009 Document Revised: 02/02/2016 Document Reviewed: 10/01/2014 Elsevier Interactive Patient Education  2017 [employer].

## 2021-07-20 NOTE — Progress Notes (Signed)
Subjective:   Tonya Russell is a 87 y.o. female who presents for Medicare Annual (Subsequent) preventive examination.  Review of Systems     Cardiac Risk Factors include: advanced age (>85men, >63 women)     Objective:    Today's Vitals   07/20/21 1125  PainSc: 9    There is no height or weight on file to calculate BMI.  Advanced Directives 07/20/2021 03/17/2021 09/16/2020 07/15/2020 03/11/2020 03/01/2020 02/23/2020  Does Patient Have a Medical Advance Directive? Yes Yes Yes Yes Yes Yes Yes  Type of Paramedic of [place];Living will;Out of facility DNR (pink MOST or yellow form) [place];Living will;Out of facility DNR (pink MOST or yellow form) [place];Living will;Out of facility DNR (pink MOST or yellow form) [place];Living will;Out of facility DNR (pink MOST or yellow form) Out of facility DNR (pink MOST or yellow form);Living will;Healthcare Power of [place];Living will;Out of facility DNR (pink MOST or yellow form) Living will;Healthcare Power of [place];Out of facility DNR (pink MOST or yellow form)  Does patient want to make changes to medical advance directive? No - Patient declined No - Patient declined No - Patient declined No - Patient declined No - Patient declined No - Patient declined No - Patient declined  Copy of [place] in Chart? Yes - validated most recent copy scanned in chart (See row information) Yes - validated most recent copy scanned in chart (See row information) Yes - validated most recent copy scanned in chart (See row information) Yes - validated most recent copy scanned in chart (See row information) Yes - validated most recent copy scanned in chart (See row information) Yes - validated most recent copy scanned in chart (See row information) Yes - validated most recent copy scanned in chart (See row information)  Pre-existing out of  facility DNR order (yellow form or pink MOST form) - Yellow form placed in chart (order not valid for inpatient use) - - - Pink MOST/Yellow Form most recent copy in chart - Physician notified to receive inpatient order -    Current Medications (verified) Outpatient Encounter Medications as of 07/20/2021  Medication Sig   acetaminophen (TYLENOL) 500 MG tablet Take 500 mg by mouth in the morning, at noon, and at bedtime.   alendronate (FOSAMAX) 70 MG tablet TAKE 1 TABLET BY MOUTH ONCE A WEEK. TAKE WITH A FULL GLASS OF WATER ON AN EMPTY STOMACH.   aspirin EC 81 MG tablet Take 81 mg by mouth daily.   bimatoprost (LUMIGAN) 0.01 % SOLN Place 1 drop into both eyes at bedtime.   Calcium Carb-Cholecalciferol (CALCIUM 600+D3 PO) Take 2 tablets by mouth every evening.    celecoxib (CELEBREX) 100 MG capsule Take 1 capsule (100 mg total) by mouth 2 (two) times daily. One in the morning, and one in the afternoon.   Cholecalciferol (VITAMIN D3) 50 MCG (2000 UT) capsule Take 2,000 Units by mouth daily.   docusate sodium (COLACE) 100 MG capsule Take 1 capsule (100 mg total) by mouth daily.   DULoxetine (CYMBALTA) 60 MG capsule TAKE 1 CAPSULE(60 MG) BY MOUTH AT BEDTIME   fluorouracil (EFUDEX) 5 % cream SMARTSIG:Sparingly Topical Twice Daily PRN   gabapentin (NEURONTIN) 100 MG capsule Take 100 mg by mouth in the morning.   gabapentin (NEURONTIN) 300 MG capsule TAKE ONE CAPSULE BY MOUTH ONCE DAILY AT BEDTIME   Inulin (FIBER CHOICE PO) Take 5 g by mouth daily  as needed (to improve digestive health- mix and drink).    levothyroxine (SYNTHROID) 50 MCG tablet TAKE 1 TABLET BY MOUTH EVERY MORNING ON EMPTY STOMACH 30 MINUTES BEFORE BREAKFAST   Multiple Vitamin (MULTIVITAMIN WITH MINERALS) TABS tablet Take 1 tablet by mouth daily with lunch.    Omega-3 Fatty Acids (FISH OIL) 1200 MG CAPS Take 1,200 mg by mouth 3 (three) times daily.   omeprazole (PRILOSEC) 20 MG capsule TAKE 1 CAPSULE BY MOUTH EVERY DAY FOR STOMACH ACID    Polyethyl Glycol-Propyl Glycol (SYSTANE ULTRA OP) Place 1 drop into both eyes 3 (three) times daily as needed (dry eyes).    QUEtiapine (SEROQUEL) 25 MG tablet Take one tablet by mouth once daily at bedtime.   traMADol (ULTRAM) 50 MG tablet TAKE 1 TABLET(50 MG) BY MOUTH AT BEDTIME   [DISCONTINUED] fluticasone (FLONASE) 50 MCG/ACT nasal spray Place 1 spray into both nostrils daily as needed for allergies or rhinitis.  (Patient not taking: Reported on 07/12/2021)   [DISCONTINUED] nystatin (MYCOSTATIN/NYSTOP) powder Apply 1 application topically in the morning, at noon, in the evening, and at bedtime. Under Breast. (Patient not taking: Reported on 07/12/2021)   No facility-administered encounter medications on file as of 07/20/2021.    Allergies (verified) Penicillins, Codeine, Lodine [etodolac], and Wild lettuce [lactuca virosa]   History: 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)    Obesity    Oral aphthae    Osteoarthrosis, unspecified whether generalized or localized, unspecified site    Other abnormal blood chemistry    Other voice and resonance disorders    Pain in joint, pelvic region and thigh    Palpitations    Pancreatitis    Peripheral vascular disease, unspecified ([place])    Schatzki's ring    Senile osteoporosis    Torus fracture of fibula    Unspecified constipation    Unspecified hypothyroidism    Past Surgical History:  Procedure  Laterality Date   APPENDECTOMY     BASAL CELL CARCINOMA EXCISION     right humerus, right temple, left side of nose   BASAL CELL CARCINOMA EXCISION     chest, left cheek   CATARACT EXTRACTION, BILATERAL     CHOLECYSTECTOMY     keratosis     left arm   resection of ampullary tumor     squamous cell cancer     Right leg   squamous cell cancer left triceps Left    Dr. Lorenza [place]   TONSILLECTOMY AND ADENOIDECTOMY     Family History  Problem Relation Age of Onset   Cancer Mother        spindle cell carcinoma on neck   Colon cancer Father 24   Breast cancer Sister    Colon polyps Sister    Heart disease Brother    Breast cancer Other        neiece x 3   Diabetes  Niece    Esophageal cancer Neg Hx    Pancreatic cancer Neg Hx    Kidney disease Neg Hx    Liver disease Neg Hx    Social History   Socioeconomic History   Marital status: Single    Spouse name: Not on file   Number of children: 0   Years of education: 12   Highest education level: 12th grade  Occupational History   Occupation: Retired  Tobacco Use   Smoking status: Never   Smokeless tobacco: Never  Vaping Use   Vaping Use: Never used  Substance and Sexual Activity   Alcohol use: No   Drug use: No   Sexual activity: Never  Other Topics Concern   Not on file  Social History Narrative   Not on file   Social Determinants of Health   Financial Resource Strain: Not on file  Food Insecurity: Not on file  Transportation Needs: Not on file  Physical Activity: Not on file  Stress: Not on file  Social Connections: Not on file    Tobacco Counseling Counseling given: Not Answered   Clinical Intake:  Pre-visit preparation completed: No  Pain : 0-10 Pain Score: 9  Pain Type: Chronic pain Pain Location: Hip (legs on skin lesion excision) Pain Orientation: Right Pain Radiating Towards: No Pain Descriptors / Indicators: Burning, Aching Pain Onset: Other (comment) (chronic) Pain Frequency:  Intermittent Pain Relieving Factors: tylenol Effect of Pain on Daily Activities: No  Pain Relieving Factors: tylenol  BMI - recorded: 25.08 Nutritional Status: BMI 25 -29 Overweight Nutritional Risks: None Diabetes: No  How often do you need to have someone help you when you read instructions, pamphlets, or other written materials from your doctor or pharmacy?: 5 - Always What is the last grade level you completed in school?: 12 Grade  Diabetic?No   Interpreter Needed?: No  Information entered by :: Vallen Calabrese,FNP-C   Activities of Daily Living In your present state of health, do you have any difficulty performing the following activities: 07/20/2021  Hearing? Y  Vision? Y  Difficulty concentrating or making decisions? Y  Comment remembering  Walking or climbing stairs? Y  Dressing or bathing? N  Doing errands, shopping? Y  Comment nephew Copy and eating ? Y  Using the Toilet? N  In the past six months, have you accidently leaked urine? N  Managing your Medications? N  Managing your Finances? Y  Comment Careers information officer? Y  Comment Nephew  Some recent data might be hidden    Patient Care Team: Nicolemarie Wooley, Nelda Bucks, NP as PCP - General (Family Medicine) [name], MD as Consulting Physician (Gastroenterology) Stanford Breed Denice Bors, MD as Consulting Physician (Cardiology) Druscilla Brownie, MD as Consulting Physician (Dermatology) [name], MD as Consulting Physician (Ophthalmology)  Indicate any recent Medical Services you may have received from other than [facility] providers in the past year (date may be approximate).     Assessment:   This is a routine wellness examination for [facility]  Hearing/Vision screen Hearing Screening - Comments:: Some Hearing Concerns. Vision Screening - Comments:: Some vision concerns. Patient wears prescription glasses. Patient last eye exam was August 10th, 2022  Dietary issues  and exercise activities discussed: Current Exercise Habits: Home exercise routine, Type of exercise: walking, Time (Minutes): 20, Frequency (Times/Week): 3, Weekly Exercise (Minutes/Week): 60, Intensity: Mild, Exercise limited by: Other - see comments (unsteady gait,hip)   Goals Addressed  This Visit's Progress    Exercise More   On track    Starting 07/04/16, I will attempt to exercise at least twice a week, walking or using my pedaling machine.      Maintain Lifestyle   On track    Starting today pt will maintain lifestyle.       Depression Screen PHQ 2/9 Scores 07/20/2021 07/15/2020 03/01/2020 11/04/2019 07/13/2019 06/29/2019 02/26/2019  PHQ - 2 Score 0 0 0 0 0 0 0  Exception Documentation - - Medical reason - - - -    Fall Risk Fall Risk  07/20/2021 07/12/2021 03/17/2021 09/16/2020 07/15/2020  Falls in the past year? 1 1 0 0 1  Number falls in past yr: 1 1 0 0 0  Injury with Fall? 0 0 0 0 0  Comment - - - - -  Risk Factor Category  - - - - -  Risk for fall due to : History of fall(s) History of fall(s) No Fall Risks - -  Follow up Falls evaluation completed;Education provided;Falls prevention discussed Falls evaluation completed Falls evaluation completed - -    FALL RISK PREVENTION PERTAINING TO THE HOME:  Any stairs in or around the home? No  If so, are there any without handrails? No  Home free of loose throw rugs in walkways, pet beds, electrical cords, etc? No  Adequate lighting in your home to reduce risk of falls? Yes   ASSISTIVE DEVICES UTILIZED TO PREVENT FALLS:  Life alert? Yes  Use of a cane, walker or w/c? Yes  Grab bars in the bathroom? No  Shower chair or bench in shower? Yes  Elevated toilet seat or a handicapped toilet? Yes   TIMED UP AND GO:  Was the test performed? No .  Length of time to ambulate 10 feet: N/A  sec.   Gait slow and steady with assistive device  Cognitive Function: MMSE - Mini Mental State Exam 07/13/2019 01/07/2018  03/27/2017 03/27/2017 07/04/2016  Orientation to time 3 2 5 5 5   Orientation to Place 2 5 5 5 5   Registration 3 3 3 3 3   Attention/ Calculation 5 5 5 5 5   Recall 1 2 3 3 3   Language- name 2 objects 2 2 2 2 2   Language- repeat 1 1 1 1 1   Language- follow 3 step command 2 3 3 3 3   Language- read & follow direction 0 1 1 1 1   Write a sentence 1 1 1 1 1   Copy design 0 0 1 1 1   Total score 20 25 30 30 30      6CIT Screen 07/20/2021 07/15/2020  What Year? 4 points 4 points  What month? 3 points 3 points  What time? 3 points 0 points  Count back from 20 4 points 4 points  Months in reverse 4 points 4 points  Repeat phrase 10 points 8 points  Total Score 28 23    Immunizations Immunization History  Administered Date(s) Administered   Fluad Quad(high Dose 65+) 06/04/2019   Influenza, High Dose Seasonal PF 05/09/2017, 06/19/2018, 06/11/2020, 06/15/2021   Influenza,inj,Quad PF,6+ Mos 06/17/2013, 06/23/2014, 09/07/2015, 07/04/2016   Influenza-Unspecified 07/09/2012   PFIZER(Purple Top)SARS-COV-2 Vaccination 11/02/2019, 11/23/2019, 06/11/2020, 06/15/2021   Pneumococcal Conjugate-13 07/11/2018   Pneumococcal Polysaccharide-23 07/04/2016   Pneumococcal-Unspecified 09/10/1992   Tdap 09/11/2007, 07/11/2018   Zoster Recombinat (Shingrix) 07/09/2017, 10/23/2017   Zoster, Live 11/28/2010    TDAP status: Up to date  Flu Vaccine status: Up to date  Pneumococcal vaccine  status: Up to date  Covid-19 vaccine status: Completed vaccines  Qualifies for Shingles Vaccine? Yes   Zostavax completed Yes   Shingrix Completed?: Yes  Screening Tests Health Maintenance  Topic Date Due   COVID-19 Vaccine (5 - Booster for Pfizer series) 08/10/2021   TETANUS/TDAP  07/11/2028   Pneumonia Vaccine 59+ Years old  Completed   INFLUENZA VACCINE  Completed   DEXA SCAN  Completed   Zoster Vaccines- Shingrix  Completed   HPV VACCINES  Aged Out    Health Maintenance  There are no preventive care  reminders to display for this patient.  Colorectal cancer screening: No longer required.   Mammogram status: No longer required due to advance age.  Bone Density status: Completed 10/09/2019 . Results reflect: Bone density results: OSTEOPOROSIS. Repeat every 2 years.  Lung Cancer Screening: (Low Dose CT Chest recommended if Age 67-80 years, 30 pack-year currently smoking OR have quit w/in 15years.) does not qualify.   Lung Cancer Screening Referral: No   Additional Screening:  Hepatitis C Screening: does not qualify; Completed No   Vision Screening: Recommended annual ophthalmology exams for early detection of glaucoma and other disorders of the eye. Is the patient up to date with their annual eye exam?  Yes  Who is the provider or what is the name of the office in which the patient attends annual eye exams? Dr.Tanner  If pt is not established with a provider, would they like to be referred to a provider to establish care? No .   Dental Screening: Recommended annual dental exams for proper oral hygiene  Community Resource Referral / Chronic Care Management: CRR required this visit?  No   CCM required this visit?  No      Plan:     I have personally reviewed and noted the following in the patient's chart:   Medical and social history Use of alcohol, tobacco or illicit drugs  Current medications and supplements including opioid prescriptions.  Functional ability and status Nutritional status Physical activity Advanced directives List of other physicians Hospitalizations, surgeries, and ER visits in previous 12 months Vitals Screenings to include cognitive, depression, and falls Referrals and appointments  In addition, I have reviewed and discussed with patient certain preventive protocols, quality metrics, and best practice recommendations. A written personalized care plan for preventive services as well as general preventive health recommendations were provided to  patient.     Sandrea Hughs, NP   07/20/2021   Nurse Notes: Up  to date

## 2021-07-28 ENCOUNTER — Ambulatory Visit (INDEPENDENT_AMBULATORY_CARE_PROVIDER_SITE_OTHER): Payer: Medicare Other | Admitting: Podiatry

## 2021-07-28 ENCOUNTER — Encounter: Payer: Self-pay | Admitting: Podiatry

## 2021-07-28 ENCOUNTER — Other Ambulatory Visit: Payer: Self-pay

## 2021-07-28 DIAGNOSIS — M79674 Pain in right toe(s): Secondary | ICD-10-CM | POA: Diagnosis not present

## 2021-07-28 DIAGNOSIS — M79675 Pain in left toe(s): Secondary | ICD-10-CM

## 2021-07-28 DIAGNOSIS — B351 Tinea unguium: Secondary | ICD-10-CM

## 2021-07-28 DIAGNOSIS — I739 Peripheral vascular disease, unspecified: Secondary | ICD-10-CM

## 2021-07-28 NOTE — Progress Notes (Signed)
This patient returns to the office for evaluation and treatment of long thick painful nails .  This patient is unable to trim her own nails since the patient cannot reach her feet.  Patient says the nails are painful walking and wearing his shoes. She presents to the office with her son.  She returns for preventive foot care services.  General Appearance  Alert, conversant and in no acute stress.  Vascular  Dorsalis pedis and posterior tibial  pulses are weakly  palpable  bilaterally.  Capillary return is within normal limits  bilaterally. Cold feet.   bilaterally. Bluish hue feet  B/l.  Neurologic  Senn-Weinstein monofilament wire test WNL  bilaterally. Muscle power within normal limits bilaterally.  Nails Thick disfigured discolored nails with subungual debris  from hallux to fifth toes bilaterally. No evidence of bacterial infection or drainage bilaterally.  Orthopedic  No limitations of motion  feet .  No crepitus or effusions noted.  No bony pathology or digital deformities noted.  Skin  normotropic skin with no porokeratosis noted bilaterally.  No signs of infections or ulcers noted.     Onychomycosis  Pain in toes right foot  Pain in toes left foot  Debridement  of nails  1-5  B/L with a nail nipper.  Nails were then filed using a dremel tool with no incidents.    RTC  3 months    Gardiner Barefoot DPM

## 2021-07-29 ENCOUNTER — Other Ambulatory Visit: Payer: Self-pay | Admitting: Family

## 2021-07-31 ENCOUNTER — Other Ambulatory Visit: Payer: Self-pay | Admitting: Orthopedic Surgery

## 2021-07-31 DIAGNOSIS — M1712 Unilateral primary osteoarthritis, left knee: Secondary | ICD-10-CM

## 2021-07-31 MED ORDER — TRAMADOL HCL 50 MG PO TABS: ORAL_TABLET | ORAL | 0 refills | Status: AC

## 2021-07-31 NOTE — Telephone Encounter (Signed)
Given her health history, I do not think this medication should be long term. This medication can cause increased confusion. I would recommend taking tylenol 1000 mg twice daily for pain and trying voltaren gel.

## 2021-07-31 NOTE — Telephone Encounter (Signed)
Patient has requested refill on medication "Tramadol". Patient last refill was 06/28/2021. Patient was given 30 day supply. Patient medication states acute prescription. Please verify if patient will be taking this medication long term. Medication already has Allergy Contraindications. Medication pend and sent to Windell Moulding, NP due to PCP Ngetich, Nelda Bucks, NP being out of office.

## 2021-07-31 NOTE — Telephone Encounter (Signed)
I will refill for one more month since Webb Silversmith is out. We will need to clarify when she gets back. Prescription sent.

## 2021-07-31 NOTE — Telephone Encounter (Signed)
Patient nephew "[name]" states that patient is already taking 6 Tylenol daily and using Voltaren gel. He states that patient needs something else. Medication pend and sent to Windell Moulding, NP due to PCP Ngetich, Nelda Bucks, NP being out of office.

## 2021-07-31 NOTE — Telephone Encounter (Signed)
Called patient nephew "[name] " back and notified him. States he appreciates the help and will wait for Dinah to also clarify when she returns. Message routed back to Windell Moulding, NP. No further action needed.

## 2021-08-06 DIAGNOSIS — M1990 Unspecified osteoarthritis, unspecified site: Secondary | ICD-10-CM | POA: Diagnosis not present

## 2021-08-06 DIAGNOSIS — R2681 Unsteadiness on feet: Secondary | ICD-10-CM | POA: Diagnosis not present

## 2021-08-06 DIAGNOSIS — G301 Alzheimer's disease with late onset: Secondary | ICD-10-CM | POA: Diagnosis not present

## 2021-08-07 DIAGNOSIS — F0280 Dementia in other diseases classified elsewhere without behavioral disturbance: Secondary | ICD-10-CM | POA: Diagnosis not present

## 2021-08-07 DIAGNOSIS — R5381 Other malaise: Secondary | ICD-10-CM | POA: Diagnosis not present

## 2021-08-07 DIAGNOSIS — R2681 Unsteadiness on feet: Secondary | ICD-10-CM | POA: Diagnosis not present

## 2021-08-07 DIAGNOSIS — M1990 Unspecified osteoarthritis, unspecified site: Secondary | ICD-10-CM | POA: Diagnosis not present

## 2021-08-07 DIAGNOSIS — F0390 Unspecified dementia without behavioral disturbance: Secondary | ICD-10-CM | POA: Diagnosis not present

## 2021-08-07 DIAGNOSIS — G301 Alzheimer's disease with late onset: Secondary | ICD-10-CM | POA: Diagnosis not present

## 2021-08-08 DIAGNOSIS — U071 COVID-19: Secondary | ICD-10-CM | POA: Diagnosis not present

## 2021-08-08 DIAGNOSIS — R2681 Unsteadiness on feet: Secondary | ICD-10-CM | POA: Diagnosis not present

## 2021-08-08 DIAGNOSIS — G301 Alzheimer's disease with late onset: Secondary | ICD-10-CM | POA: Diagnosis not present

## 2021-08-08 DIAGNOSIS — M1990 Unspecified osteoarthritis, unspecified site: Secondary | ICD-10-CM | POA: Diagnosis not present

## 2021-08-09 DIAGNOSIS — M1990 Unspecified osteoarthritis, unspecified site: Secondary | ICD-10-CM | POA: Diagnosis not present

## 2021-08-09 DIAGNOSIS — R2681 Unsteadiness on feet: Secondary | ICD-10-CM | POA: Diagnosis not present

## 2021-08-09 DIAGNOSIS — G301 Alzheimer's disease with late onset: Secondary | ICD-10-CM | POA: Diagnosis not present

## 2021-08-10 DIAGNOSIS — G301 Alzheimer's disease with late onset: Secondary | ICD-10-CM | POA: Diagnosis not present

## 2021-08-10 DIAGNOSIS — R2681 Unsteadiness on feet: Secondary | ICD-10-CM | POA: Diagnosis not present

## 2021-08-13 DIAGNOSIS — G301 Alzheimer's disease with late onset: Secondary | ICD-10-CM | POA: Diagnosis not present

## 2021-08-13 DIAGNOSIS — R2681 Unsteadiness on feet: Secondary | ICD-10-CM | POA: Diagnosis not present

## 2021-08-14 DIAGNOSIS — G301 Alzheimer's disease with late onset: Secondary | ICD-10-CM | POA: Diagnosis not present

## 2021-08-14 DIAGNOSIS — R2681 Unsteadiness on feet: Secondary | ICD-10-CM | POA: Diagnosis not present

## 2021-08-15 DIAGNOSIS — G301 Alzheimer's disease with late onset: Secondary | ICD-10-CM | POA: Diagnosis not present

## 2021-08-15 DIAGNOSIS — R2681 Unsteadiness on feet: Secondary | ICD-10-CM | POA: Diagnosis not present

## 2021-08-16 DIAGNOSIS — G301 Alzheimer's disease with late onset: Secondary | ICD-10-CM | POA: Diagnosis not present

## 2021-08-16 DIAGNOSIS — R2681 Unsteadiness on feet: Secondary | ICD-10-CM | POA: Diagnosis not present

## 2021-08-17 DIAGNOSIS — G301 Alzheimer's disease with late onset: Secondary | ICD-10-CM | POA: Diagnosis not present

## 2021-08-17 DIAGNOSIS — R2681 Unsteadiness on feet: Secondary | ICD-10-CM | POA: Diagnosis not present

## 2021-08-20 DIAGNOSIS — R2681 Unsteadiness on feet: Secondary | ICD-10-CM | POA: Diagnosis not present

## 2021-08-20 DIAGNOSIS — G301 Alzheimer's disease with late onset: Secondary | ICD-10-CM | POA: Diagnosis not present

## 2021-08-21 DIAGNOSIS — G301 Alzheimer's disease with late onset: Secondary | ICD-10-CM | POA: Diagnosis not present

## 2021-08-21 DIAGNOSIS — R2681 Unsteadiness on feet: Secondary | ICD-10-CM | POA: Diagnosis not present

## 2021-08-22 DIAGNOSIS — R2681 Unsteadiness on feet: Secondary | ICD-10-CM | POA: Diagnosis not present

## 2021-08-22 DIAGNOSIS — F0390 Unspecified dementia without behavioral disturbance: Secondary | ICD-10-CM | POA: Diagnosis not present

## 2021-08-22 DIAGNOSIS — F0280 Dementia in other diseases classified elsewhere without behavioral disturbance: Secondary | ICD-10-CM | POA: Diagnosis not present

## 2021-08-22 DIAGNOSIS — G301 Alzheimer's disease with late onset: Secondary | ICD-10-CM | POA: Diagnosis not present

## 2021-08-22 DIAGNOSIS — R5381 Other malaise: Secondary | ICD-10-CM | POA: Diagnosis not present

## 2021-08-23 DIAGNOSIS — G301 Alzheimer's disease with late onset: Secondary | ICD-10-CM | POA: Diagnosis not present

## 2021-08-23 DIAGNOSIS — R2681 Unsteadiness on feet: Secondary | ICD-10-CM | POA: Diagnosis not present

## 2021-08-24 DIAGNOSIS — G301 Alzheimer's disease with late onset: Secondary | ICD-10-CM | POA: Diagnosis not present

## 2021-08-24 DIAGNOSIS — R2681 Unsteadiness on feet: Secondary | ICD-10-CM | POA: Diagnosis not present

## 2021-08-27 DIAGNOSIS — G301 Alzheimer's disease with late onset: Secondary | ICD-10-CM | POA: Diagnosis not present

## 2021-08-27 DIAGNOSIS — R2681 Unsteadiness on feet: Secondary | ICD-10-CM | POA: Diagnosis not present

## 2021-08-28 DIAGNOSIS — R2681 Unsteadiness on feet: Secondary | ICD-10-CM | POA: Diagnosis not present

## 2021-08-28 DIAGNOSIS — G301 Alzheimer's disease with late onset: Secondary | ICD-10-CM | POA: Diagnosis not present

## 2021-08-29 DIAGNOSIS — G301 Alzheimer's disease with late onset: Secondary | ICD-10-CM | POA: Diagnosis not present

## 2021-08-29 DIAGNOSIS — R2681 Unsteadiness on feet: Secondary | ICD-10-CM | POA: Diagnosis not present

## 2021-08-30 DIAGNOSIS — G301 Alzheimer's disease with late onset: Secondary | ICD-10-CM | POA: Diagnosis not present

## 2021-08-30 DIAGNOSIS — R2681 Unsteadiness on feet: Secondary | ICD-10-CM | POA: Diagnosis not present

## 2021-08-31 DIAGNOSIS — R2681 Unsteadiness on feet: Secondary | ICD-10-CM | POA: Diagnosis not present

## 2021-08-31 DIAGNOSIS — G301 Alzheimer's disease with late onset: Secondary | ICD-10-CM | POA: Diagnosis not present

## 2021-09-03 DIAGNOSIS — G301 Alzheimer's disease with late onset: Secondary | ICD-10-CM | POA: Diagnosis not present

## 2021-09-03 DIAGNOSIS — R2681 Unsteadiness on feet: Secondary | ICD-10-CM | POA: Diagnosis not present

## 2021-09-04 DIAGNOSIS — R2681 Unsteadiness on feet: Secondary | ICD-10-CM | POA: Diagnosis not present

## 2021-09-04 DIAGNOSIS — G301 Alzheimer's disease with late onset: Secondary | ICD-10-CM | POA: Diagnosis not present

## 2021-09-05 DIAGNOSIS — R2681 Unsteadiness on feet: Secondary | ICD-10-CM | POA: Diagnosis not present

## 2021-09-05 DIAGNOSIS — G301 Alzheimer's disease with late onset: Secondary | ICD-10-CM | POA: Diagnosis not present

## 2021-09-06 DIAGNOSIS — G301 Alzheimer's disease with late onset: Secondary | ICD-10-CM | POA: Diagnosis not present

## 2021-09-06 DIAGNOSIS — R2681 Unsteadiness on feet: Secondary | ICD-10-CM | POA: Diagnosis not present

## 2021-09-07 DIAGNOSIS — M810 Age-related osteoporosis without current pathological fracture: Secondary | ICD-10-CM | POA: Diagnosis not present

## 2021-09-07 DIAGNOSIS — Z79899 Other long term (current) drug therapy: Secondary | ICD-10-CM | POA: Diagnosis not present

## 2021-09-07 DIAGNOSIS — R6889 Other general symptoms and signs: Secondary | ICD-10-CM | POA: Diagnosis not present

## 2021-09-07 DIAGNOSIS — E559 Vitamin D deficiency, unspecified: Secondary | ICD-10-CM | POA: Diagnosis not present

## 2021-09-10 DIAGNOSIS — G301 Alzheimer's disease with late onset: Secondary | ICD-10-CM | POA: Diagnosis not present

## 2021-09-10 DIAGNOSIS — R2689 Other abnormalities of gait and mobility: Secondary | ICD-10-CM | POA: Diagnosis not present

## 2021-09-10 DIAGNOSIS — R2681 Unsteadiness on feet: Secondary | ICD-10-CM | POA: Diagnosis not present

## 2021-09-10 DIAGNOSIS — M1990 Unspecified osteoarthritis, unspecified site: Secondary | ICD-10-CM | POA: Diagnosis not present

## 2021-09-10 DIAGNOSIS — M6281 Muscle weakness (generalized): Secondary | ICD-10-CM | POA: Diagnosis not present

## 2021-09-11 DIAGNOSIS — R2681 Unsteadiness on feet: Secondary | ICD-10-CM | POA: Diagnosis not present

## 2021-09-11 DIAGNOSIS — R2689 Other abnormalities of gait and mobility: Secondary | ICD-10-CM | POA: Diagnosis not present

## 2021-09-11 DIAGNOSIS — M6281 Muscle weakness (generalized): Secondary | ICD-10-CM | POA: Diagnosis not present

## 2021-09-11 DIAGNOSIS — M1990 Unspecified osteoarthritis, unspecified site: Secondary | ICD-10-CM | POA: Diagnosis not present

## 2021-09-11 DIAGNOSIS — G301 Alzheimer's disease with late onset: Secondary | ICD-10-CM | POA: Diagnosis not present

## 2021-09-12 DIAGNOSIS — R2681 Unsteadiness on feet: Secondary | ICD-10-CM | POA: Diagnosis not present

## 2021-09-12 DIAGNOSIS — M6281 Muscle weakness (generalized): Secondary | ICD-10-CM | POA: Diagnosis not present

## 2021-09-12 DIAGNOSIS — M1990 Unspecified osteoarthritis, unspecified site: Secondary | ICD-10-CM | POA: Diagnosis not present

## 2021-09-12 DIAGNOSIS — R2689 Other abnormalities of gait and mobility: Secondary | ICD-10-CM | POA: Diagnosis not present

## 2021-09-12 DIAGNOSIS — G301 Alzheimer's disease with late onset: Secondary | ICD-10-CM | POA: Diagnosis not present

## 2021-09-13 DIAGNOSIS — G301 Alzheimer's disease with late onset: Secondary | ICD-10-CM | POA: Diagnosis not present

## 2021-09-13 DIAGNOSIS — M1990 Unspecified osteoarthritis, unspecified site: Secondary | ICD-10-CM | POA: Diagnosis not present

## 2021-09-13 DIAGNOSIS — M6281 Muscle weakness (generalized): Secondary | ICD-10-CM | POA: Diagnosis not present

## 2021-09-13 DIAGNOSIS — R2681 Unsteadiness on feet: Secondary | ICD-10-CM | POA: Diagnosis not present

## 2021-09-13 DIAGNOSIS — M7989 Other specified soft tissue disorders: Secondary | ICD-10-CM | POA: Diagnosis not present

## 2021-09-13 DIAGNOSIS — R2689 Other abnormalities of gait and mobility: Secondary | ICD-10-CM | POA: Diagnosis not present

## 2021-09-14 DIAGNOSIS — M1990 Unspecified osteoarthritis, unspecified site: Secondary | ICD-10-CM | POA: Diagnosis not present

## 2021-09-14 DIAGNOSIS — R2689 Other abnormalities of gait and mobility: Secondary | ICD-10-CM | POA: Diagnosis not present

## 2021-09-14 DIAGNOSIS — R2681 Unsteadiness on feet: Secondary | ICD-10-CM | POA: Diagnosis not present

## 2021-09-14 DIAGNOSIS — G301 Alzheimer's disease with late onset: Secondary | ICD-10-CM | POA: Diagnosis not present

## 2021-09-14 DIAGNOSIS — M6281 Muscle weakness (generalized): Secondary | ICD-10-CM | POA: Diagnosis not present

## 2021-09-17 DIAGNOSIS — G301 Alzheimer's disease with late onset: Secondary | ICD-10-CM | POA: Diagnosis not present

## 2021-09-17 DIAGNOSIS — R2681 Unsteadiness on feet: Secondary | ICD-10-CM | POA: Diagnosis not present

## 2021-09-17 DIAGNOSIS — R2689 Other abnormalities of gait and mobility: Secondary | ICD-10-CM | POA: Diagnosis not present

## 2021-09-17 DIAGNOSIS — M6281 Muscle weakness (generalized): Secondary | ICD-10-CM | POA: Diagnosis not present

## 2021-09-17 DIAGNOSIS — M1990 Unspecified osteoarthritis, unspecified site: Secondary | ICD-10-CM | POA: Diagnosis not present

## 2021-09-18 DIAGNOSIS — G301 Alzheimer's disease with late onset: Secondary | ICD-10-CM | POA: Diagnosis not present

## 2021-09-18 DIAGNOSIS — M1990 Unspecified osteoarthritis, unspecified site: Secondary | ICD-10-CM | POA: Diagnosis not present

## 2021-09-18 DIAGNOSIS — M6281 Muscle weakness (generalized): Secondary | ICD-10-CM | POA: Diagnosis not present

## 2021-09-18 DIAGNOSIS — R2681 Unsteadiness on feet: Secondary | ICD-10-CM | POA: Diagnosis not present

## 2021-09-18 DIAGNOSIS — R2689 Other abnormalities of gait and mobility: Secondary | ICD-10-CM | POA: Diagnosis not present

## 2021-09-19 DIAGNOSIS — G301 Alzheimer's disease with late onset: Secondary | ICD-10-CM | POA: Diagnosis not present

## 2021-09-19 DIAGNOSIS — M6281 Muscle weakness (generalized): Secondary | ICD-10-CM | POA: Diagnosis not present

## 2021-09-19 DIAGNOSIS — M1990 Unspecified osteoarthritis, unspecified site: Secondary | ICD-10-CM | POA: Diagnosis not present

## 2021-09-19 DIAGNOSIS — R2689 Other abnormalities of gait and mobility: Secondary | ICD-10-CM | POA: Diagnosis not present

## 2021-09-19 DIAGNOSIS — R2681 Unsteadiness on feet: Secondary | ICD-10-CM | POA: Diagnosis not present

## 2021-09-20 DIAGNOSIS — G301 Alzheimer's disease with late onset: Secondary | ICD-10-CM | POA: Diagnosis not present

## 2021-09-20 DIAGNOSIS — R2681 Unsteadiness on feet: Secondary | ICD-10-CM | POA: Diagnosis not present

## 2021-09-20 DIAGNOSIS — M1990 Unspecified osteoarthritis, unspecified site: Secondary | ICD-10-CM | POA: Diagnosis not present

## 2021-09-20 DIAGNOSIS — R2689 Other abnormalities of gait and mobility: Secondary | ICD-10-CM | POA: Diagnosis not present

## 2021-09-20 DIAGNOSIS — M6281 Muscle weakness (generalized): Secondary | ICD-10-CM | POA: Diagnosis not present

## 2021-09-21 ENCOUNTER — Ambulatory Visit: Payer: Medicare Other | Admitting: Family

## 2021-09-21 DIAGNOSIS — G301 Alzheimer's disease with late onset: Secondary | ICD-10-CM | POA: Diagnosis not present

## 2021-09-21 DIAGNOSIS — R2681 Unsteadiness on feet: Secondary | ICD-10-CM | POA: Diagnosis not present

## 2021-09-21 DIAGNOSIS — R2689 Other abnormalities of gait and mobility: Secondary | ICD-10-CM | POA: Diagnosis not present

## 2021-09-21 DIAGNOSIS — M1990 Unspecified osteoarthritis, unspecified site: Secondary | ICD-10-CM | POA: Diagnosis not present

## 2021-09-21 DIAGNOSIS — M6281 Muscle weakness (generalized): Secondary | ICD-10-CM | POA: Diagnosis not present

## 2021-09-24 DIAGNOSIS — M6281 Muscle weakness (generalized): Secondary | ICD-10-CM | POA: Diagnosis not present

## 2021-09-24 DIAGNOSIS — R2681 Unsteadiness on feet: Secondary | ICD-10-CM | POA: Diagnosis not present

## 2021-09-24 DIAGNOSIS — G301 Alzheimer's disease with late onset: Secondary | ICD-10-CM | POA: Diagnosis not present

## 2021-09-24 DIAGNOSIS — M1990 Unspecified osteoarthritis, unspecified site: Secondary | ICD-10-CM | POA: Diagnosis not present

## 2021-09-24 DIAGNOSIS — R2689 Other abnormalities of gait and mobility: Secondary | ICD-10-CM | POA: Diagnosis not present

## 2021-09-25 DIAGNOSIS — M6281 Muscle weakness (generalized): Secondary | ICD-10-CM | POA: Diagnosis not present

## 2021-09-25 DIAGNOSIS — M1990 Unspecified osteoarthritis, unspecified site: Secondary | ICD-10-CM | POA: Diagnosis not present

## 2021-09-25 DIAGNOSIS — G301 Alzheimer's disease with late onset: Secondary | ICD-10-CM | POA: Diagnosis not present

## 2021-09-25 DIAGNOSIS — R2689 Other abnormalities of gait and mobility: Secondary | ICD-10-CM | POA: Diagnosis not present

## 2021-09-25 DIAGNOSIS — R2681 Unsteadiness on feet: Secondary | ICD-10-CM | POA: Diagnosis not present

## 2021-09-26 DIAGNOSIS — R609 Edema, unspecified: Secondary | ICD-10-CM | POA: Diagnosis not present

## 2021-10-02 DIAGNOSIS — I872 Venous insufficiency (chronic) (peripheral): Secondary | ICD-10-CM | POA: Diagnosis not present

## 2021-10-02 DIAGNOSIS — C44722 Squamous cell carcinoma of skin of right lower limb, including hip: Secondary | ICD-10-CM | POA: Diagnosis not present

## 2021-10-02 DIAGNOSIS — Z08 Encounter for follow-up examination after completed treatment for malignant neoplasm: Secondary | ICD-10-CM | POA: Diagnosis not present

## 2021-10-02 DIAGNOSIS — Z86007 Personal history of in-situ neoplasm of skin: Secondary | ICD-10-CM | POA: Diagnosis not present

## 2021-10-02 DIAGNOSIS — C44729 Squamous cell carcinoma of skin of left lower limb, including hip: Secondary | ICD-10-CM | POA: Diagnosis not present

## 2021-10-04 DIAGNOSIS — R2689 Other abnormalities of gait and mobility: Secondary | ICD-10-CM | POA: Diagnosis not present

## 2021-10-04 DIAGNOSIS — M6281 Muscle weakness (generalized): Secondary | ICD-10-CM | POA: Diagnosis not present

## 2021-10-04 DIAGNOSIS — M1990 Unspecified osteoarthritis, unspecified site: Secondary | ICD-10-CM | POA: Diagnosis not present

## 2021-10-04 DIAGNOSIS — G301 Alzheimer's disease with late onset: Secondary | ICD-10-CM | POA: Diagnosis not present

## 2021-10-04 DIAGNOSIS — R2681 Unsteadiness on feet: Secondary | ICD-10-CM | POA: Diagnosis not present

## 2021-10-06 DIAGNOSIS — M1990 Unspecified osteoarthritis, unspecified site: Secondary | ICD-10-CM | POA: Diagnosis not present

## 2021-10-06 DIAGNOSIS — R2689 Other abnormalities of gait and mobility: Secondary | ICD-10-CM | POA: Diagnosis not present

## 2021-10-06 DIAGNOSIS — G301 Alzheimer's disease with late onset: Secondary | ICD-10-CM | POA: Diagnosis not present

## 2021-10-06 DIAGNOSIS — R2681 Unsteadiness on feet: Secondary | ICD-10-CM | POA: Diagnosis not present

## 2021-10-06 DIAGNOSIS — M6281 Muscle weakness (generalized): Secondary | ICD-10-CM | POA: Diagnosis not present

## 2021-10-07 DIAGNOSIS — M6281 Muscle weakness (generalized): Secondary | ICD-10-CM | POA: Diagnosis not present

## 2021-10-07 DIAGNOSIS — R2689 Other abnormalities of gait and mobility: Secondary | ICD-10-CM | POA: Diagnosis not present

## 2021-10-07 DIAGNOSIS — M1990 Unspecified osteoarthritis, unspecified site: Secondary | ICD-10-CM | POA: Diagnosis not present

## 2021-10-07 DIAGNOSIS — G301 Alzheimer's disease with late onset: Secondary | ICD-10-CM | POA: Diagnosis not present

## 2021-10-07 DIAGNOSIS — R2681 Unsteadiness on feet: Secondary | ICD-10-CM | POA: Diagnosis not present

## 2021-10-10 DIAGNOSIS — R2689 Other abnormalities of gait and mobility: Secondary | ICD-10-CM | POA: Diagnosis not present

## 2021-10-10 DIAGNOSIS — G301 Alzheimer's disease with late onset: Secondary | ICD-10-CM | POA: Diagnosis not present

## 2021-10-10 DIAGNOSIS — M1990 Unspecified osteoarthritis, unspecified site: Secondary | ICD-10-CM | POA: Diagnosis not present

## 2021-10-10 DIAGNOSIS — M6281 Muscle weakness (generalized): Secondary | ICD-10-CM | POA: Diagnosis not present

## 2021-10-10 DIAGNOSIS — R2681 Unsteadiness on feet: Secondary | ICD-10-CM | POA: Diagnosis not present

## 2021-10-11 DIAGNOSIS — F339 Major depressive disorder, recurrent, unspecified: Secondary | ICD-10-CM | POA: Diagnosis not present

## 2021-10-11 DIAGNOSIS — R2689 Other abnormalities of gait and mobility: Secondary | ICD-10-CM | POA: Diagnosis not present

## 2021-10-11 DIAGNOSIS — I739 Peripheral vascular disease, unspecified: Secondary | ICD-10-CM | POA: Diagnosis not present

## 2021-10-11 DIAGNOSIS — F0280 Dementia in other diseases classified elsewhere without behavioral disturbance: Secondary | ICD-10-CM | POA: Diagnosis not present

## 2021-10-11 DIAGNOSIS — M6281 Muscle weakness (generalized): Secondary | ICD-10-CM | POA: Diagnosis not present

## 2021-10-11 DIAGNOSIS — G301 Alzheimer's disease with late onset: Secondary | ICD-10-CM | POA: Diagnosis not present

## 2021-10-12 DIAGNOSIS — M79674 Pain in right toe(s): Secondary | ICD-10-CM | POA: Diagnosis not present

## 2021-10-12 DIAGNOSIS — M79675 Pain in left toe(s): Secondary | ICD-10-CM | POA: Diagnosis not present

## 2021-10-12 DIAGNOSIS — B351 Tinea unguium: Secondary | ICD-10-CM | POA: Diagnosis not present

## 2021-10-12 DIAGNOSIS — M6281 Muscle weakness (generalized): Secondary | ICD-10-CM | POA: Diagnosis not present

## 2021-10-12 DIAGNOSIS — R2689 Other abnormalities of gait and mobility: Secondary | ICD-10-CM | POA: Diagnosis not present

## 2021-10-13 ENCOUNTER — Telehealth: Payer: Self-pay

## 2021-10-13 DIAGNOSIS — R2689 Other abnormalities of gait and mobility: Secondary | ICD-10-CM | POA: Diagnosis not present

## 2021-10-13 DIAGNOSIS — M6281 Muscle weakness (generalized): Secondary | ICD-10-CM | POA: Diagnosis not present

## 2021-10-13 MED ORDER — ALENDRONATE SODIUM 70 MG PO TABS: ORAL_TABLET | ORAL | 3 refills | Status: AC

## 2021-10-13 NOTE — Telephone Encounter (Signed)
[facility] send a rx request for Alendronate 70 MG and medication was send into pharmacy.

## 2021-10-14 DIAGNOSIS — R2689 Other abnormalities of gait and mobility: Secondary | ICD-10-CM | POA: Diagnosis not present

## 2021-10-14 DIAGNOSIS — M6281 Muscle weakness (generalized): Secondary | ICD-10-CM | POA: Diagnosis not present

## 2021-10-17 DIAGNOSIS — R2689 Other abnormalities of gait and mobility: Secondary | ICD-10-CM | POA: Diagnosis not present

## 2021-10-17 DIAGNOSIS — M6281 Muscle weakness (generalized): Secondary | ICD-10-CM | POA: Diagnosis not present

## 2021-10-18 DIAGNOSIS — M6281 Muscle weakness (generalized): Secondary | ICD-10-CM | POA: Diagnosis not present

## 2021-10-18 DIAGNOSIS — R2689 Other abnormalities of gait and mobility: Secondary | ICD-10-CM | POA: Diagnosis not present

## 2021-10-19 DIAGNOSIS — R2689 Other abnormalities of gait and mobility: Secondary | ICD-10-CM | POA: Diagnosis not present

## 2021-10-19 DIAGNOSIS — M6281 Muscle weakness (generalized): Secondary | ICD-10-CM | POA: Diagnosis not present

## 2021-10-20 DIAGNOSIS — R2689 Other abnormalities of gait and mobility: Secondary | ICD-10-CM | POA: Diagnosis not present

## 2021-10-20 DIAGNOSIS — M6281 Muscle weakness (generalized): Secondary | ICD-10-CM | POA: Diagnosis not present

## 2021-10-21 DIAGNOSIS — M6281 Muscle weakness (generalized): Secondary | ICD-10-CM | POA: Diagnosis not present

## 2021-10-21 DIAGNOSIS — R2689 Other abnormalities of gait and mobility: Secondary | ICD-10-CM | POA: Diagnosis not present

## 2021-10-24 DIAGNOSIS — R2689 Other abnormalities of gait and mobility: Secondary | ICD-10-CM | POA: Diagnosis not present

## 2021-10-24 DIAGNOSIS — M6281 Muscle weakness (generalized): Secondary | ICD-10-CM | POA: Diagnosis not present

## 2021-10-25 DIAGNOSIS — R2689 Other abnormalities of gait and mobility: Secondary | ICD-10-CM | POA: Diagnosis not present

## 2021-10-25 DIAGNOSIS — R600 Localized edema: Secondary | ICD-10-CM | POA: Diagnosis not present

## 2021-10-25 DIAGNOSIS — M6281 Muscle weakness (generalized): Secondary | ICD-10-CM | POA: Diagnosis not present

## 2021-10-26 DIAGNOSIS — M6281 Muscle weakness (generalized): Secondary | ICD-10-CM | POA: Diagnosis not present

## 2021-10-26 DIAGNOSIS — R2689 Other abnormalities of gait and mobility: Secondary | ICD-10-CM | POA: Diagnosis not present

## 2021-10-27 DIAGNOSIS — M6281 Muscle weakness (generalized): Secondary | ICD-10-CM | POA: Diagnosis not present

## 2021-10-27 DIAGNOSIS — R2689 Other abnormalities of gait and mobility: Secondary | ICD-10-CM | POA: Diagnosis not present

## 2021-10-28 DIAGNOSIS — R2689 Other abnormalities of gait and mobility: Secondary | ICD-10-CM | POA: Diagnosis not present

## 2021-10-28 DIAGNOSIS — M6281 Muscle weakness (generalized): Secondary | ICD-10-CM | POA: Diagnosis not present

## 2021-10-30 DIAGNOSIS — R2689 Other abnormalities of gait and mobility: Secondary | ICD-10-CM | POA: Diagnosis not present

## 2021-10-30 DIAGNOSIS — M6281 Muscle weakness (generalized): Secondary | ICD-10-CM | POA: Diagnosis not present

## 2021-11-01 DIAGNOSIS — M6281 Muscle weakness (generalized): Secondary | ICD-10-CM | POA: Diagnosis not present

## 2021-11-01 DIAGNOSIS — R2689 Other abnormalities of gait and mobility: Secondary | ICD-10-CM | POA: Diagnosis not present

## 2021-11-02 DIAGNOSIS — R2689 Other abnormalities of gait and mobility: Secondary | ICD-10-CM | POA: Diagnosis not present

## 2021-11-02 DIAGNOSIS — M6281 Muscle weakness (generalized): Secondary | ICD-10-CM | POA: Diagnosis not present

## 2021-11-03 DIAGNOSIS — M6281 Muscle weakness (generalized): Secondary | ICD-10-CM | POA: Diagnosis not present

## 2021-11-03 DIAGNOSIS — R2689 Other abnormalities of gait and mobility: Secondary | ICD-10-CM | POA: Diagnosis not present

## 2021-11-04 DIAGNOSIS — J810 Acute pulmonary edema: Secondary | ICD-10-CM | POA: Diagnosis not present

## 2021-11-04 DIAGNOSIS — I517 Cardiomegaly: Secondary | ICD-10-CM | POA: Diagnosis not present

## 2021-11-04 DIAGNOSIS — I509 Heart failure, unspecified: Secondary | ICD-10-CM | POA: Diagnosis not present

## 2021-11-06 DIAGNOSIS — I509 Heart failure, unspecified: Secondary | ICD-10-CM | POA: Diagnosis not present

## 2021-11-07 DIAGNOSIS — J90 Pleural effusion, not elsewhere classified: Secondary | ICD-10-CM | POA: Diagnosis not present

## 2021-11-07 DIAGNOSIS — R0989 Other specified symptoms and signs involving the circulatory and respiratory systems: Secondary | ICD-10-CM | POA: Diagnosis not present

## 2021-11-08 DIAGNOSIS — M1990 Unspecified osteoarthritis, unspecified site: Secondary | ICD-10-CM | POA: Diagnosis not present

## 2021-11-08 DIAGNOSIS — M810 Age-related osteoporosis without current pathological fracture: Secondary | ICD-10-CM | POA: Diagnosis not present

## 2021-11-08 DIAGNOSIS — G301 Alzheimer's disease with late onset: Secondary | ICD-10-CM | POA: Diagnosis not present

## 2021-11-08 DIAGNOSIS — M6281 Muscle weakness (generalized): Secondary | ICD-10-CM | POA: Diagnosis not present

## 2021-11-08 DIAGNOSIS — R2689 Other abnormalities of gait and mobility: Secondary | ICD-10-CM | POA: Diagnosis not present

## 2021-11-08 DIAGNOSIS — R293 Abnormal posture: Secondary | ICD-10-CM | POA: Diagnosis not present

## 2021-11-09 DIAGNOSIS — E039 Hypothyroidism, unspecified: Secondary | ICD-10-CM | POA: Diagnosis not present

## 2021-11-09 DIAGNOSIS — R2689 Other abnormalities of gait and mobility: Secondary | ICD-10-CM | POA: Diagnosis not present

## 2021-11-09 DIAGNOSIS — M1990 Unspecified osteoarthritis, unspecified site: Secondary | ICD-10-CM | POA: Diagnosis not present

## 2021-11-09 DIAGNOSIS — M6281 Muscle weakness (generalized): Secondary | ICD-10-CM | POA: Diagnosis not present

## 2021-11-09 DIAGNOSIS — F0280 Dementia in other diseases classified elsewhere without behavioral disturbance: Secondary | ICD-10-CM | POA: Diagnosis not present

## 2021-11-09 DIAGNOSIS — I509 Heart failure, unspecified: Secondary | ICD-10-CM | POA: Diagnosis not present

## 2021-11-09 DIAGNOSIS — M810 Age-related osteoporosis without current pathological fracture: Secondary | ICD-10-CM | POA: Diagnosis not present

## 2021-11-09 DIAGNOSIS — R293 Abnormal posture: Secondary | ICD-10-CM | POA: Diagnosis not present

## 2021-11-09 DIAGNOSIS — G301 Alzheimer's disease with late onset: Secondary | ICD-10-CM | POA: Diagnosis not present

## 2021-11-10 DIAGNOSIS — M810 Age-related osteoporosis without current pathological fracture: Secondary | ICD-10-CM | POA: Diagnosis not present

## 2021-11-10 DIAGNOSIS — M1990 Unspecified osteoarthritis, unspecified site: Secondary | ICD-10-CM | POA: Diagnosis not present

## 2021-11-10 DIAGNOSIS — R293 Abnormal posture: Secondary | ICD-10-CM | POA: Diagnosis not present

## 2021-11-10 DIAGNOSIS — M6281 Muscle weakness (generalized): Secondary | ICD-10-CM | POA: Diagnosis not present

## 2021-11-10 DIAGNOSIS — R2689 Other abnormalities of gait and mobility: Secondary | ICD-10-CM | POA: Diagnosis not present

## 2021-11-10 DIAGNOSIS — G301 Alzheimer's disease with late onset: Secondary | ICD-10-CM | POA: Diagnosis not present

## 2021-11-12 DIAGNOSIS — R293 Abnormal posture: Secondary | ICD-10-CM | POA: Diagnosis not present

## 2021-11-12 DIAGNOSIS — G301 Alzheimer's disease with late onset: Secondary | ICD-10-CM | POA: Diagnosis not present

## 2021-11-12 DIAGNOSIS — M810 Age-related osteoporosis without current pathological fracture: Secondary | ICD-10-CM | POA: Diagnosis not present

## 2021-11-12 DIAGNOSIS — M1990 Unspecified osteoarthritis, unspecified site: Secondary | ICD-10-CM | POA: Diagnosis not present

## 2021-11-12 DIAGNOSIS — R2689 Other abnormalities of gait and mobility: Secondary | ICD-10-CM | POA: Diagnosis not present

## 2021-11-12 DIAGNOSIS — M6281 Muscle weakness (generalized): Secondary | ICD-10-CM | POA: Diagnosis not present

## 2021-11-14 DIAGNOSIS — R2689 Other abnormalities of gait and mobility: Secondary | ICD-10-CM | POA: Diagnosis not present

## 2021-11-14 DIAGNOSIS — G301 Alzheimer's disease with late onset: Secondary | ICD-10-CM | POA: Diagnosis not present

## 2021-11-14 DIAGNOSIS — R293 Abnormal posture: Secondary | ICD-10-CM | POA: Diagnosis not present

## 2021-11-14 DIAGNOSIS — M6281 Muscle weakness (generalized): Secondary | ICD-10-CM | POA: Diagnosis not present

## 2021-11-14 DIAGNOSIS — M810 Age-related osteoporosis without current pathological fracture: Secondary | ICD-10-CM | POA: Diagnosis not present

## 2021-11-14 DIAGNOSIS — M1990 Unspecified osteoarthritis, unspecified site: Secondary | ICD-10-CM | POA: Diagnosis not present

## 2021-11-15 DIAGNOSIS — M1990 Unspecified osteoarthritis, unspecified site: Secondary | ICD-10-CM | POA: Diagnosis not present

## 2021-11-15 DIAGNOSIS — M6281 Muscle weakness (generalized): Secondary | ICD-10-CM | POA: Diagnosis not present

## 2021-11-15 DIAGNOSIS — M810 Age-related osteoporosis without current pathological fracture: Secondary | ICD-10-CM | POA: Diagnosis not present

## 2021-11-15 DIAGNOSIS — R2689 Other abnormalities of gait and mobility: Secondary | ICD-10-CM | POA: Diagnosis not present

## 2021-11-15 DIAGNOSIS — R293 Abnormal posture: Secondary | ICD-10-CM | POA: Diagnosis not present

## 2021-11-15 DIAGNOSIS — G301 Alzheimer's disease with late onset: Secondary | ICD-10-CM | POA: Diagnosis not present

## 2021-11-16 DIAGNOSIS — R2689 Other abnormalities of gait and mobility: Secondary | ICD-10-CM | POA: Diagnosis not present

## 2021-11-16 DIAGNOSIS — M1990 Unspecified osteoarthritis, unspecified site: Secondary | ICD-10-CM | POA: Diagnosis not present

## 2021-11-16 DIAGNOSIS — M810 Age-related osteoporosis without current pathological fracture: Secondary | ICD-10-CM | POA: Diagnosis not present

## 2021-11-16 DIAGNOSIS — G301 Alzheimer's disease with late onset: Secondary | ICD-10-CM | POA: Diagnosis not present

## 2021-11-16 DIAGNOSIS — R293 Abnormal posture: Secondary | ICD-10-CM | POA: Diagnosis not present

## 2021-11-16 DIAGNOSIS — M6281 Muscle weakness (generalized): Secondary | ICD-10-CM | POA: Diagnosis not present

## 2021-11-18 DIAGNOSIS — M1990 Unspecified osteoarthritis, unspecified site: Secondary | ICD-10-CM | POA: Diagnosis not present

## 2021-11-18 DIAGNOSIS — M6281 Muscle weakness (generalized): Secondary | ICD-10-CM | POA: Diagnosis not present

## 2021-11-18 DIAGNOSIS — M810 Age-related osteoporosis without current pathological fracture: Secondary | ICD-10-CM | POA: Diagnosis not present

## 2021-11-18 DIAGNOSIS — G301 Alzheimer's disease with late onset: Secondary | ICD-10-CM | POA: Diagnosis not present

## 2021-11-18 DIAGNOSIS — R293 Abnormal posture: Secondary | ICD-10-CM | POA: Diagnosis not present

## 2021-11-18 DIAGNOSIS — R2689 Other abnormalities of gait and mobility: Secondary | ICD-10-CM | POA: Diagnosis not present

## 2021-11-20 DIAGNOSIS — R293 Abnormal posture: Secondary | ICD-10-CM | POA: Diagnosis not present

## 2021-11-20 DIAGNOSIS — M1990 Unspecified osteoarthritis, unspecified site: Secondary | ICD-10-CM | POA: Diagnosis not present

## 2021-11-20 DIAGNOSIS — M810 Age-related osteoporosis without current pathological fracture: Secondary | ICD-10-CM | POA: Diagnosis not present

## 2021-11-20 DIAGNOSIS — M6281 Muscle weakness (generalized): Secondary | ICD-10-CM | POA: Diagnosis not present

## 2021-11-20 DIAGNOSIS — G301 Alzheimer's disease with late onset: Secondary | ICD-10-CM | POA: Diagnosis not present

## 2021-11-20 DIAGNOSIS — R2689 Other abnormalities of gait and mobility: Secondary | ICD-10-CM | POA: Diagnosis not present

## 2021-11-21 DIAGNOSIS — M810 Age-related osteoporosis without current pathological fracture: Secondary | ICD-10-CM | POA: Diagnosis not present

## 2021-11-21 DIAGNOSIS — G301 Alzheimer's disease with late onset: Secondary | ICD-10-CM | POA: Diagnosis not present

## 2021-11-21 DIAGNOSIS — M6281 Muscle weakness (generalized): Secondary | ICD-10-CM | POA: Diagnosis not present

## 2021-11-21 DIAGNOSIS — R293 Abnormal posture: Secondary | ICD-10-CM | POA: Diagnosis not present

## 2021-11-21 DIAGNOSIS — M1990 Unspecified osteoarthritis, unspecified site: Secondary | ICD-10-CM | POA: Diagnosis not present

## 2021-11-21 DIAGNOSIS — R2689 Other abnormalities of gait and mobility: Secondary | ICD-10-CM | POA: Diagnosis not present

## 2021-11-22 DIAGNOSIS — G301 Alzheimer's disease with late onset: Secondary | ICD-10-CM | POA: Diagnosis not present

## 2021-11-22 DIAGNOSIS — R293 Abnormal posture: Secondary | ICD-10-CM | POA: Diagnosis not present

## 2021-11-22 DIAGNOSIS — M6281 Muscle weakness (generalized): Secondary | ICD-10-CM | POA: Diagnosis not present

## 2021-11-22 DIAGNOSIS — M1990 Unspecified osteoarthritis, unspecified site: Secondary | ICD-10-CM | POA: Diagnosis not present

## 2021-11-22 DIAGNOSIS — M810 Age-related osteoporosis without current pathological fracture: Secondary | ICD-10-CM | POA: Diagnosis not present

## 2021-11-22 DIAGNOSIS — R2689 Other abnormalities of gait and mobility: Secondary | ICD-10-CM | POA: Diagnosis not present

## 2021-11-23 DIAGNOSIS — R2689 Other abnormalities of gait and mobility: Secondary | ICD-10-CM | POA: Diagnosis not present

## 2021-11-23 DIAGNOSIS — M1990 Unspecified osteoarthritis, unspecified site: Secondary | ICD-10-CM | POA: Diagnosis not present

## 2021-11-23 DIAGNOSIS — G301 Alzheimer's disease with late onset: Secondary | ICD-10-CM | POA: Diagnosis not present

## 2021-11-23 DIAGNOSIS — R293 Abnormal posture: Secondary | ICD-10-CM | POA: Diagnosis not present

## 2021-11-23 DIAGNOSIS — M6281 Muscle weakness (generalized): Secondary | ICD-10-CM | POA: Diagnosis not present

## 2021-11-23 DIAGNOSIS — M810 Age-related osteoporosis without current pathological fracture: Secondary | ICD-10-CM | POA: Diagnosis not present

## 2021-11-24 DIAGNOSIS — M6281 Muscle weakness (generalized): Secondary | ICD-10-CM | POA: Diagnosis not present

## 2021-11-24 DIAGNOSIS — M810 Age-related osteoporosis without current pathological fracture: Secondary | ICD-10-CM | POA: Diagnosis not present

## 2021-11-24 DIAGNOSIS — R293 Abnormal posture: Secondary | ICD-10-CM | POA: Diagnosis not present

## 2021-11-24 DIAGNOSIS — R2689 Other abnormalities of gait and mobility: Secondary | ICD-10-CM | POA: Diagnosis not present

## 2021-11-24 DIAGNOSIS — M1990 Unspecified osteoarthritis, unspecified site: Secondary | ICD-10-CM | POA: Diagnosis not present

## 2021-11-24 DIAGNOSIS — G301 Alzheimer's disease with late onset: Secondary | ICD-10-CM | POA: Diagnosis not present

## 2021-11-25 DIAGNOSIS — M1990 Unspecified osteoarthritis, unspecified site: Secondary | ICD-10-CM | POA: Diagnosis not present

## 2021-11-25 DIAGNOSIS — R293 Abnormal posture: Secondary | ICD-10-CM | POA: Diagnosis not present

## 2021-11-25 DIAGNOSIS — M6281 Muscle weakness (generalized): Secondary | ICD-10-CM | POA: Diagnosis not present

## 2021-11-25 DIAGNOSIS — M810 Age-related osteoporosis without current pathological fracture: Secondary | ICD-10-CM | POA: Diagnosis not present

## 2021-11-25 DIAGNOSIS — R2689 Other abnormalities of gait and mobility: Secondary | ICD-10-CM | POA: Diagnosis not present

## 2021-11-25 DIAGNOSIS — G301 Alzheimer's disease with late onset: Secondary | ICD-10-CM | POA: Diagnosis not present

## 2021-11-26 DIAGNOSIS — R293 Abnormal posture: Secondary | ICD-10-CM | POA: Diagnosis not present

## 2021-11-26 DIAGNOSIS — R2689 Other abnormalities of gait and mobility: Secondary | ICD-10-CM | POA: Diagnosis not present

## 2021-11-26 DIAGNOSIS — M810 Age-related osteoporosis without current pathological fracture: Secondary | ICD-10-CM | POA: Diagnosis not present

## 2021-11-26 DIAGNOSIS — M1990 Unspecified osteoarthritis, unspecified site: Secondary | ICD-10-CM | POA: Diagnosis not present

## 2021-11-26 DIAGNOSIS — M6281 Muscle weakness (generalized): Secondary | ICD-10-CM | POA: Diagnosis not present

## 2021-11-26 DIAGNOSIS — G301 Alzheimer's disease with late onset: Secondary | ICD-10-CM | POA: Diagnosis not present

## 2021-11-27 DIAGNOSIS — I517 Cardiomegaly: Secondary | ICD-10-CM | POA: Diagnosis not present

## 2021-11-27 DIAGNOSIS — M810 Age-related osteoporosis without current pathological fracture: Secondary | ICD-10-CM | POA: Diagnosis not present

## 2021-11-27 DIAGNOSIS — R609 Edema, unspecified: Secondary | ICD-10-CM | POA: Diagnosis not present

## 2021-11-27 DIAGNOSIS — R2689 Other abnormalities of gait and mobility: Secondary | ICD-10-CM | POA: Diagnosis not present

## 2021-11-27 DIAGNOSIS — R0689 Other abnormalities of breathing: Secondary | ICD-10-CM | POA: Diagnosis not present

## 2021-11-27 DIAGNOSIS — R293 Abnormal posture: Secondary | ICD-10-CM | POA: Diagnosis not present

## 2021-11-27 DIAGNOSIS — J984 Other disorders of lung: Secondary | ICD-10-CM | POA: Diagnosis not present

## 2021-11-27 DIAGNOSIS — M6281 Muscle weakness (generalized): Secondary | ICD-10-CM | POA: Diagnosis not present

## 2021-11-27 DIAGNOSIS — G301 Alzheimer's disease with late onset: Secondary | ICD-10-CM | POA: Diagnosis not present

## 2021-11-27 DIAGNOSIS — M1990 Unspecified osteoarthritis, unspecified site: Secondary | ICD-10-CM | POA: Diagnosis not present

## 2021-11-27 DIAGNOSIS — J9811 Atelectasis: Secondary | ICD-10-CM | POA: Diagnosis not present

## 2021-11-28 DIAGNOSIS — M810 Age-related osteoporosis without current pathological fracture: Secondary | ICD-10-CM | POA: Diagnosis not present

## 2021-11-28 DIAGNOSIS — M1990 Unspecified osteoarthritis, unspecified site: Secondary | ICD-10-CM | POA: Diagnosis not present

## 2021-11-28 DIAGNOSIS — R2689 Other abnormalities of gait and mobility: Secondary | ICD-10-CM | POA: Diagnosis not present

## 2021-11-28 DIAGNOSIS — R293 Abnormal posture: Secondary | ICD-10-CM | POA: Diagnosis not present

## 2021-11-28 DIAGNOSIS — M6281 Muscle weakness (generalized): Secondary | ICD-10-CM | POA: Diagnosis not present

## 2021-11-28 DIAGNOSIS — G301 Alzheimer's disease with late onset: Secondary | ICD-10-CM | POA: Diagnosis not present

## 2021-11-29 DIAGNOSIS — M6281 Muscle weakness (generalized): Secondary | ICD-10-CM | POA: Diagnosis not present

## 2021-11-29 DIAGNOSIS — M1990 Unspecified osteoarthritis, unspecified site: Secondary | ICD-10-CM | POA: Diagnosis not present

## 2021-11-29 DIAGNOSIS — M810 Age-related osteoporosis without current pathological fracture: Secondary | ICD-10-CM | POA: Diagnosis not present

## 2021-11-29 DIAGNOSIS — G301 Alzheimer's disease with late onset: Secondary | ICD-10-CM | POA: Diagnosis not present

## 2021-11-29 DIAGNOSIS — R2689 Other abnormalities of gait and mobility: Secondary | ICD-10-CM | POA: Diagnosis not present

## 2021-11-29 DIAGNOSIS — R293 Abnormal posture: Secondary | ICD-10-CM | POA: Diagnosis not present

## 2021-11-30 DIAGNOSIS — M1990 Unspecified osteoarthritis, unspecified site: Secondary | ICD-10-CM | POA: Diagnosis not present

## 2021-11-30 DIAGNOSIS — M810 Age-related osteoporosis without current pathological fracture: Secondary | ICD-10-CM | POA: Diagnosis not present

## 2021-11-30 DIAGNOSIS — R2689 Other abnormalities of gait and mobility: Secondary | ICD-10-CM | POA: Diagnosis not present

## 2021-11-30 DIAGNOSIS — M6281 Muscle weakness (generalized): Secondary | ICD-10-CM | POA: Diagnosis not present

## 2021-11-30 DIAGNOSIS — E559 Vitamin D deficiency, unspecified: Secondary | ICD-10-CM | POA: Diagnosis not present

## 2021-11-30 DIAGNOSIS — G301 Alzheimer's disease with late onset: Secondary | ICD-10-CM | POA: Diagnosis not present

## 2021-11-30 DIAGNOSIS — R293 Abnormal posture: Secondary | ICD-10-CM | POA: Diagnosis not present

## 2021-12-01 ENCOUNTER — Ambulatory Visit: Payer: Medicare Other | Admitting: Podiatry

## 2021-12-01 DIAGNOSIS — G301 Alzheimer's disease with late onset: Secondary | ICD-10-CM | POA: Diagnosis not present

## 2021-12-01 DIAGNOSIS — M6281 Muscle weakness (generalized): Secondary | ICD-10-CM | POA: Diagnosis not present

## 2021-12-01 DIAGNOSIS — M810 Age-related osteoporosis without current pathological fracture: Secondary | ICD-10-CM | POA: Diagnosis not present

## 2021-12-01 DIAGNOSIS — M1990 Unspecified osteoarthritis, unspecified site: Secondary | ICD-10-CM | POA: Diagnosis not present

## 2021-12-01 DIAGNOSIS — R2689 Other abnormalities of gait and mobility: Secondary | ICD-10-CM | POA: Diagnosis not present

## 2021-12-01 DIAGNOSIS — R293 Abnormal posture: Secondary | ICD-10-CM | POA: Diagnosis not present

## 2021-12-02 DIAGNOSIS — R293 Abnormal posture: Secondary | ICD-10-CM | POA: Diagnosis not present

## 2021-12-02 DIAGNOSIS — M810 Age-related osteoporosis without current pathological fracture: Secondary | ICD-10-CM | POA: Diagnosis not present

## 2021-12-02 DIAGNOSIS — G301 Alzheimer's disease with late onset: Secondary | ICD-10-CM | POA: Diagnosis not present

## 2021-12-02 DIAGNOSIS — R2689 Other abnormalities of gait and mobility: Secondary | ICD-10-CM | POA: Diagnosis not present

## 2021-12-02 DIAGNOSIS — M1990 Unspecified osteoarthritis, unspecified site: Secondary | ICD-10-CM | POA: Diagnosis not present

## 2021-12-02 DIAGNOSIS — M6281 Muscle weakness (generalized): Secondary | ICD-10-CM | POA: Diagnosis not present

## 2021-12-03 DIAGNOSIS — M810 Age-related osteoporosis without current pathological fracture: Secondary | ICD-10-CM | POA: Diagnosis not present

## 2021-12-03 DIAGNOSIS — M6281 Muscle weakness (generalized): Secondary | ICD-10-CM | POA: Diagnosis not present

## 2021-12-03 DIAGNOSIS — G301 Alzheimer's disease with late onset: Secondary | ICD-10-CM | POA: Diagnosis not present

## 2021-12-03 DIAGNOSIS — R2689 Other abnormalities of gait and mobility: Secondary | ICD-10-CM | POA: Diagnosis not present

## 2021-12-03 DIAGNOSIS — R293 Abnormal posture: Secondary | ICD-10-CM | POA: Diagnosis not present

## 2021-12-03 DIAGNOSIS — M1990 Unspecified osteoarthritis, unspecified site: Secondary | ICD-10-CM | POA: Diagnosis not present

## 2021-12-04 DIAGNOSIS — R2689 Other abnormalities of gait and mobility: Secondary | ICD-10-CM | POA: Diagnosis not present

## 2021-12-04 DIAGNOSIS — M810 Age-related osteoporosis without current pathological fracture: Secondary | ICD-10-CM | POA: Diagnosis not present

## 2021-12-04 DIAGNOSIS — M6281 Muscle weakness (generalized): Secondary | ICD-10-CM | POA: Diagnosis not present

## 2021-12-04 DIAGNOSIS — R293 Abnormal posture: Secondary | ICD-10-CM | POA: Diagnosis not present

## 2021-12-04 DIAGNOSIS — M1990 Unspecified osteoarthritis, unspecified site: Secondary | ICD-10-CM | POA: Diagnosis not present

## 2021-12-04 DIAGNOSIS — G301 Alzheimer's disease with late onset: Secondary | ICD-10-CM | POA: Diagnosis not present

## 2021-12-05 DIAGNOSIS — R2689 Other abnormalities of gait and mobility: Secondary | ICD-10-CM | POA: Diagnosis not present

## 2021-12-05 DIAGNOSIS — M810 Age-related osteoporosis without current pathological fracture: Secondary | ICD-10-CM | POA: Diagnosis not present

## 2021-12-05 DIAGNOSIS — R293 Abnormal posture: Secondary | ICD-10-CM | POA: Diagnosis not present

## 2021-12-05 DIAGNOSIS — M6281 Muscle weakness (generalized): Secondary | ICD-10-CM | POA: Diagnosis not present

## 2021-12-05 DIAGNOSIS — M1990 Unspecified osteoarthritis, unspecified site: Secondary | ICD-10-CM | POA: Diagnosis not present

## 2021-12-05 DIAGNOSIS — G301 Alzheimer's disease with late onset: Secondary | ICD-10-CM | POA: Diagnosis not present

## 2021-12-06 DIAGNOSIS — M6281 Muscle weakness (generalized): Secondary | ICD-10-CM | POA: Diagnosis not present

## 2021-12-06 DIAGNOSIS — G301 Alzheimer's disease with late onset: Secondary | ICD-10-CM | POA: Diagnosis not present

## 2021-12-06 DIAGNOSIS — M1990 Unspecified osteoarthritis, unspecified site: Secondary | ICD-10-CM | POA: Diagnosis not present

## 2021-12-06 DIAGNOSIS — R293 Abnormal posture: Secondary | ICD-10-CM | POA: Diagnosis not present

## 2021-12-06 DIAGNOSIS — M810 Age-related osteoporosis without current pathological fracture: Secondary | ICD-10-CM | POA: Diagnosis not present

## 2021-12-06 DIAGNOSIS — R2689 Other abnormalities of gait and mobility: Secondary | ICD-10-CM | POA: Diagnosis not present

## 2021-12-07 DIAGNOSIS — M6281 Muscle weakness (generalized): Secondary | ICD-10-CM | POA: Diagnosis not present

## 2021-12-07 DIAGNOSIS — M810 Age-related osteoporosis without current pathological fracture: Secondary | ICD-10-CM | POA: Diagnosis not present

## 2021-12-07 DIAGNOSIS — G301 Alzheimer's disease with late onset: Secondary | ICD-10-CM | POA: Diagnosis not present

## 2021-12-07 DIAGNOSIS — R2689 Other abnormalities of gait and mobility: Secondary | ICD-10-CM | POA: Diagnosis not present

## 2021-12-07 DIAGNOSIS — M1990 Unspecified osteoarthritis, unspecified site: Secondary | ICD-10-CM | POA: Diagnosis not present

## 2021-12-07 DIAGNOSIS — R293 Abnormal posture: Secondary | ICD-10-CM | POA: Diagnosis not present

## 2021-12-08 DIAGNOSIS — R2689 Other abnormalities of gait and mobility: Secondary | ICD-10-CM | POA: Diagnosis not present

## 2021-12-08 DIAGNOSIS — M810 Age-related osteoporosis without current pathological fracture: Secondary | ICD-10-CM | POA: Diagnosis not present

## 2021-12-08 DIAGNOSIS — M6281 Muscle weakness (generalized): Secondary | ICD-10-CM | POA: Diagnosis not present

## 2021-12-08 DIAGNOSIS — R293 Abnormal posture: Secondary | ICD-10-CM | POA: Diagnosis not present

## 2021-12-08 DIAGNOSIS — G301 Alzheimer's disease with late onset: Secondary | ICD-10-CM | POA: Diagnosis not present

## 2021-12-08 DIAGNOSIS — M1990 Unspecified osteoarthritis, unspecified site: Secondary | ICD-10-CM | POA: Diagnosis not present

## 2021-12-09 DIAGNOSIS — R2689 Other abnormalities of gait and mobility: Secondary | ICD-10-CM | POA: Diagnosis not present

## 2021-12-09 DIAGNOSIS — R292 Abnormal reflex: Secondary | ICD-10-CM | POA: Diagnosis not present

## 2021-12-09 DIAGNOSIS — M6281 Muscle weakness (generalized): Secondary | ICD-10-CM | POA: Diagnosis not present

## 2021-12-09 DIAGNOSIS — M810 Age-related osteoporosis without current pathological fracture: Secondary | ICD-10-CM | POA: Diagnosis not present

## 2021-12-09 DIAGNOSIS — M1990 Unspecified osteoarthritis, unspecified site: Secondary | ICD-10-CM | POA: Diagnosis not present

## 2021-12-10 DIAGNOSIS — M1990 Unspecified osteoarthritis, unspecified site: Secondary | ICD-10-CM | POA: Diagnosis not present

## 2021-12-10 DIAGNOSIS — M6281 Muscle weakness (generalized): Secondary | ICD-10-CM | POA: Diagnosis not present

## 2021-12-10 DIAGNOSIS — R292 Abnormal reflex: Secondary | ICD-10-CM | POA: Diagnosis not present

## 2021-12-10 DIAGNOSIS — R2689 Other abnormalities of gait and mobility: Secondary | ICD-10-CM | POA: Diagnosis not present

## 2021-12-10 DIAGNOSIS — M810 Age-related osteoporosis without current pathological fracture: Secondary | ICD-10-CM | POA: Diagnosis not present

## 2021-12-11 DIAGNOSIS — M6281 Muscle weakness (generalized): Secondary | ICD-10-CM | POA: Diagnosis not present

## 2021-12-11 DIAGNOSIS — M1990 Unspecified osteoarthritis, unspecified site: Secondary | ICD-10-CM | POA: Diagnosis not present

## 2021-12-11 DIAGNOSIS — M810 Age-related osteoporosis without current pathological fracture: Secondary | ICD-10-CM | POA: Diagnosis not present

## 2021-12-11 DIAGNOSIS — R2689 Other abnormalities of gait and mobility: Secondary | ICD-10-CM | POA: Diagnosis not present

## 2021-12-11 DIAGNOSIS — R292 Abnormal reflex: Secondary | ICD-10-CM | POA: Diagnosis not present

## 2021-12-12 DIAGNOSIS — R2689 Other abnormalities of gait and mobility: Secondary | ICD-10-CM | POA: Diagnosis not present

## 2021-12-12 DIAGNOSIS — I739 Peripheral vascular disease, unspecified: Secondary | ICD-10-CM | POA: Diagnosis not present

## 2021-12-12 DIAGNOSIS — M1990 Unspecified osteoarthritis, unspecified site: Secondary | ICD-10-CM | POA: Diagnosis not present

## 2021-12-12 DIAGNOSIS — M6281 Muscle weakness (generalized): Secondary | ICD-10-CM | POA: Diagnosis not present

## 2021-12-12 DIAGNOSIS — R292 Abnormal reflex: Secondary | ICD-10-CM | POA: Diagnosis not present

## 2021-12-12 DIAGNOSIS — F0280 Dementia in other diseases classified elsewhere without behavioral disturbance: Secondary | ICD-10-CM | POA: Diagnosis not present

## 2021-12-12 DIAGNOSIS — M810 Age-related osteoporosis without current pathological fracture: Secondary | ICD-10-CM | POA: Diagnosis not present

## 2021-12-12 DIAGNOSIS — G301 Alzheimer's disease with late onset: Secondary | ICD-10-CM | POA: Diagnosis not present

## 2021-12-13 DIAGNOSIS — R292 Abnormal reflex: Secondary | ICD-10-CM | POA: Diagnosis not present

## 2021-12-13 DIAGNOSIS — M6281 Muscle weakness (generalized): Secondary | ICD-10-CM | POA: Diagnosis not present

## 2021-12-13 DIAGNOSIS — M1990 Unspecified osteoarthritis, unspecified site: Secondary | ICD-10-CM | POA: Diagnosis not present

## 2021-12-13 DIAGNOSIS — R2689 Other abnormalities of gait and mobility: Secondary | ICD-10-CM | POA: Diagnosis not present

## 2021-12-13 DIAGNOSIS — M810 Age-related osteoporosis without current pathological fracture: Secondary | ICD-10-CM | POA: Diagnosis not present

## 2021-12-14 DIAGNOSIS — M1990 Unspecified osteoarthritis, unspecified site: Secondary | ICD-10-CM | POA: Diagnosis not present

## 2021-12-14 DIAGNOSIS — M810 Age-related osteoporosis without current pathological fracture: Secondary | ICD-10-CM | POA: Diagnosis not present

## 2021-12-14 DIAGNOSIS — R292 Abnormal reflex: Secondary | ICD-10-CM | POA: Diagnosis not present

## 2021-12-14 DIAGNOSIS — R2689 Other abnormalities of gait and mobility: Secondary | ICD-10-CM | POA: Diagnosis not present

## 2021-12-14 DIAGNOSIS — M6281 Muscle weakness (generalized): Secondary | ICD-10-CM | POA: Diagnosis not present

## 2021-12-17 DIAGNOSIS — M6281 Muscle weakness (generalized): Secondary | ICD-10-CM | POA: Diagnosis not present

## 2021-12-17 DIAGNOSIS — M810 Age-related osteoporosis without current pathological fracture: Secondary | ICD-10-CM | POA: Diagnosis not present

## 2021-12-17 DIAGNOSIS — R292 Abnormal reflex: Secondary | ICD-10-CM | POA: Diagnosis not present

## 2021-12-17 DIAGNOSIS — M1990 Unspecified osteoarthritis, unspecified site: Secondary | ICD-10-CM | POA: Diagnosis not present

## 2021-12-17 DIAGNOSIS — R2689 Other abnormalities of gait and mobility: Secondary | ICD-10-CM | POA: Diagnosis not present

## 2021-12-19 DIAGNOSIS — R292 Abnormal reflex: Secondary | ICD-10-CM | POA: Diagnosis not present

## 2021-12-19 DIAGNOSIS — M1990 Unspecified osteoarthritis, unspecified site: Secondary | ICD-10-CM | POA: Diagnosis not present

## 2021-12-19 DIAGNOSIS — M810 Age-related osteoporosis without current pathological fracture: Secondary | ICD-10-CM | POA: Diagnosis not present

## 2021-12-19 DIAGNOSIS — R2689 Other abnormalities of gait and mobility: Secondary | ICD-10-CM | POA: Diagnosis not present

## 2021-12-19 DIAGNOSIS — M6281 Muscle weakness (generalized): Secondary | ICD-10-CM | POA: Diagnosis not present

## 2021-12-20 DIAGNOSIS — R292 Abnormal reflex: Secondary | ICD-10-CM | POA: Diagnosis not present

## 2021-12-20 DIAGNOSIS — M6281 Muscle weakness (generalized): Secondary | ICD-10-CM | POA: Diagnosis not present

## 2021-12-20 DIAGNOSIS — M810 Age-related osteoporosis without current pathological fracture: Secondary | ICD-10-CM | POA: Diagnosis not present

## 2021-12-20 DIAGNOSIS — M1990 Unspecified osteoarthritis, unspecified site: Secondary | ICD-10-CM | POA: Diagnosis not present

## 2021-12-20 DIAGNOSIS — R2689 Other abnormalities of gait and mobility: Secondary | ICD-10-CM | POA: Diagnosis not present

## 2021-12-21 DIAGNOSIS — M810 Age-related osteoporosis without current pathological fracture: Secondary | ICD-10-CM | POA: Diagnosis not present

## 2021-12-21 DIAGNOSIS — M1990 Unspecified osteoarthritis, unspecified site: Secondary | ICD-10-CM | POA: Diagnosis not present

## 2021-12-21 DIAGNOSIS — R292 Abnormal reflex: Secondary | ICD-10-CM | POA: Diagnosis not present

## 2021-12-21 DIAGNOSIS — M6281 Muscle weakness (generalized): Secondary | ICD-10-CM | POA: Diagnosis not present

## 2021-12-21 DIAGNOSIS — R2689 Other abnormalities of gait and mobility: Secondary | ICD-10-CM | POA: Diagnosis not present

## 2021-12-22 DIAGNOSIS — M1990 Unspecified osteoarthritis, unspecified site: Secondary | ICD-10-CM | POA: Diagnosis not present

## 2021-12-22 DIAGNOSIS — M810 Age-related osteoporosis without current pathological fracture: Secondary | ICD-10-CM | POA: Diagnosis not present

## 2021-12-22 DIAGNOSIS — R2689 Other abnormalities of gait and mobility: Secondary | ICD-10-CM | POA: Diagnosis not present

## 2021-12-22 DIAGNOSIS — R292 Abnormal reflex: Secondary | ICD-10-CM | POA: Diagnosis not present

## 2021-12-22 DIAGNOSIS — M6281 Muscle weakness (generalized): Secondary | ICD-10-CM | POA: Diagnosis not present

## 2021-12-24 DIAGNOSIS — M1990 Unspecified osteoarthritis, unspecified site: Secondary | ICD-10-CM | POA: Diagnosis not present

## 2021-12-24 DIAGNOSIS — R292 Abnormal reflex: Secondary | ICD-10-CM | POA: Diagnosis not present

## 2021-12-24 DIAGNOSIS — R2689 Other abnormalities of gait and mobility: Secondary | ICD-10-CM | POA: Diagnosis not present

## 2021-12-24 DIAGNOSIS — M6281 Muscle weakness (generalized): Secondary | ICD-10-CM | POA: Diagnosis not present

## 2021-12-24 DIAGNOSIS — M810 Age-related osteoporosis without current pathological fracture: Secondary | ICD-10-CM | POA: Diagnosis not present

## 2021-12-25 DIAGNOSIS — R2689 Other abnormalities of gait and mobility: Secondary | ICD-10-CM | POA: Diagnosis not present

## 2021-12-25 DIAGNOSIS — M6281 Muscle weakness (generalized): Secondary | ICD-10-CM | POA: Diagnosis not present

## 2021-12-25 DIAGNOSIS — R292 Abnormal reflex: Secondary | ICD-10-CM | POA: Diagnosis not present

## 2021-12-25 DIAGNOSIS — M1990 Unspecified osteoarthritis, unspecified site: Secondary | ICD-10-CM | POA: Diagnosis not present

## 2021-12-25 DIAGNOSIS — M810 Age-related osteoporosis without current pathological fracture: Secondary | ICD-10-CM | POA: Diagnosis not present

## 2021-12-26 DIAGNOSIS — R2689 Other abnormalities of gait and mobility: Secondary | ICD-10-CM | POA: Diagnosis not present

## 2021-12-26 DIAGNOSIS — R292 Abnormal reflex: Secondary | ICD-10-CM | POA: Diagnosis not present

## 2021-12-26 DIAGNOSIS — M810 Age-related osteoporosis without current pathological fracture: Secondary | ICD-10-CM | POA: Diagnosis not present

## 2021-12-26 DIAGNOSIS — M6281 Muscle weakness (generalized): Secondary | ICD-10-CM | POA: Diagnosis not present

## 2021-12-26 DIAGNOSIS — M1990 Unspecified osteoarthritis, unspecified site: Secondary | ICD-10-CM | POA: Diagnosis not present

## 2021-12-27 DIAGNOSIS — R292 Abnormal reflex: Secondary | ICD-10-CM | POA: Diagnosis not present

## 2021-12-27 DIAGNOSIS — R2689 Other abnormalities of gait and mobility: Secondary | ICD-10-CM | POA: Diagnosis not present

## 2021-12-27 DIAGNOSIS — M1990 Unspecified osteoarthritis, unspecified site: Secondary | ICD-10-CM | POA: Diagnosis not present

## 2021-12-27 DIAGNOSIS — M810 Age-related osteoporosis without current pathological fracture: Secondary | ICD-10-CM | POA: Diagnosis not present

## 2021-12-27 DIAGNOSIS — M6281 Muscle weakness (generalized): Secondary | ICD-10-CM | POA: Diagnosis not present

## 2021-12-28 DIAGNOSIS — R292 Abnormal reflex: Secondary | ICD-10-CM | POA: Diagnosis not present

## 2021-12-28 DIAGNOSIS — M1990 Unspecified osteoarthritis, unspecified site: Secondary | ICD-10-CM | POA: Diagnosis not present

## 2021-12-28 DIAGNOSIS — M810 Age-related osteoporosis without current pathological fracture: Secondary | ICD-10-CM | POA: Diagnosis not present

## 2021-12-28 DIAGNOSIS — M6281 Muscle weakness (generalized): Secondary | ICD-10-CM | POA: Diagnosis not present

## 2021-12-28 DIAGNOSIS — R2689 Other abnormalities of gait and mobility: Secondary | ICD-10-CM | POA: Diagnosis not present

## 2021-12-31 DIAGNOSIS — M810 Age-related osteoporosis without current pathological fracture: Secondary | ICD-10-CM | POA: Diagnosis not present

## 2021-12-31 DIAGNOSIS — R2689 Other abnormalities of gait and mobility: Secondary | ICD-10-CM | POA: Diagnosis not present

## 2021-12-31 DIAGNOSIS — R292 Abnormal reflex: Secondary | ICD-10-CM | POA: Diagnosis not present

## 2021-12-31 DIAGNOSIS — M6281 Muscle weakness (generalized): Secondary | ICD-10-CM | POA: Diagnosis not present

## 2021-12-31 DIAGNOSIS — M1990 Unspecified osteoarthritis, unspecified site: Secondary | ICD-10-CM | POA: Diagnosis not present

## 2022-01-01 DIAGNOSIS — R292 Abnormal reflex: Secondary | ICD-10-CM | POA: Diagnosis not present

## 2022-01-01 DIAGNOSIS — M810 Age-related osteoporosis without current pathological fracture: Secondary | ICD-10-CM | POA: Diagnosis not present

## 2022-01-01 DIAGNOSIS — M1990 Unspecified osteoarthritis, unspecified site: Secondary | ICD-10-CM | POA: Diagnosis not present

## 2022-01-01 DIAGNOSIS — R2689 Other abnormalities of gait and mobility: Secondary | ICD-10-CM | POA: Diagnosis not present

## 2022-01-01 DIAGNOSIS — M6281 Muscle weakness (generalized): Secondary | ICD-10-CM | POA: Diagnosis not present

## 2022-01-02 DIAGNOSIS — M6281 Muscle weakness (generalized): Secondary | ICD-10-CM | POA: Diagnosis not present

## 2022-01-02 DIAGNOSIS — R292 Abnormal reflex: Secondary | ICD-10-CM | POA: Diagnosis not present

## 2022-01-02 DIAGNOSIS — M810 Age-related osteoporosis without current pathological fracture: Secondary | ICD-10-CM | POA: Diagnosis not present

## 2022-01-02 DIAGNOSIS — R2689 Other abnormalities of gait and mobility: Secondary | ICD-10-CM | POA: Diagnosis not present

## 2022-01-02 DIAGNOSIS — M1990 Unspecified osteoarthritis, unspecified site: Secondary | ICD-10-CM | POA: Diagnosis not present

## 2022-01-03 DIAGNOSIS — R2689 Other abnormalities of gait and mobility: Secondary | ICD-10-CM | POA: Diagnosis not present

## 2022-01-03 DIAGNOSIS — R292 Abnormal reflex: Secondary | ICD-10-CM | POA: Diagnosis not present

## 2022-01-03 DIAGNOSIS — M6281 Muscle weakness (generalized): Secondary | ICD-10-CM | POA: Diagnosis not present

## 2022-01-03 DIAGNOSIS — M1990 Unspecified osteoarthritis, unspecified site: Secondary | ICD-10-CM | POA: Diagnosis not present

## 2022-01-03 DIAGNOSIS — M810 Age-related osteoporosis without current pathological fracture: Secondary | ICD-10-CM | POA: Diagnosis not present

## 2022-01-04 DIAGNOSIS — R292 Abnormal reflex: Secondary | ICD-10-CM | POA: Diagnosis not present

## 2022-01-04 DIAGNOSIS — R2689 Other abnormalities of gait and mobility: Secondary | ICD-10-CM | POA: Diagnosis not present

## 2022-01-04 DIAGNOSIS — M810 Age-related osteoporosis without current pathological fracture: Secondary | ICD-10-CM | POA: Diagnosis not present

## 2022-01-04 DIAGNOSIS — M1990 Unspecified osteoarthritis, unspecified site: Secondary | ICD-10-CM | POA: Diagnosis not present

## 2022-01-04 DIAGNOSIS — M6281 Muscle weakness (generalized): Secondary | ICD-10-CM | POA: Diagnosis not present

## 2022-01-07 DIAGNOSIS — M1990 Unspecified osteoarthritis, unspecified site: Secondary | ICD-10-CM | POA: Diagnosis not present

## 2022-01-07 DIAGNOSIS — R292 Abnormal reflex: Secondary | ICD-10-CM | POA: Diagnosis not present

## 2022-01-07 DIAGNOSIS — M810 Age-related osteoporosis without current pathological fracture: Secondary | ICD-10-CM | POA: Diagnosis not present

## 2022-01-07 DIAGNOSIS — R2689 Other abnormalities of gait and mobility: Secondary | ICD-10-CM | POA: Diagnosis not present

## 2022-01-07 DIAGNOSIS — M6281 Muscle weakness (generalized): Secondary | ICD-10-CM | POA: Diagnosis not present

## 2022-01-08 DIAGNOSIS — Z9181 History of falling: Secondary | ICD-10-CM | POA: Diagnosis not present

## 2022-01-08 DIAGNOSIS — R2689 Other abnormalities of gait and mobility: Secondary | ICD-10-CM | POA: Diagnosis not present

## 2022-01-08 DIAGNOSIS — M6281 Muscle weakness (generalized): Secondary | ICD-10-CM | POA: Diagnosis not present

## 2022-01-08 DIAGNOSIS — G301 Alzheimer's disease with late onset: Secondary | ICD-10-CM | POA: Diagnosis not present

## 2022-01-08 DIAGNOSIS — R293 Abnormal posture: Secondary | ICD-10-CM | POA: Diagnosis not present

## 2022-01-10 DIAGNOSIS — M6281 Muscle weakness (generalized): Secondary | ICD-10-CM | POA: Diagnosis not present

## 2022-01-10 DIAGNOSIS — R2689 Other abnormalities of gait and mobility: Secondary | ICD-10-CM | POA: Diagnosis not present

## 2022-01-10 DIAGNOSIS — Z9181 History of falling: Secondary | ICD-10-CM | POA: Diagnosis not present

## 2022-01-10 DIAGNOSIS — G301 Alzheimer's disease with late onset: Secondary | ICD-10-CM | POA: Diagnosis not present

## 2022-01-10 DIAGNOSIS — R293 Abnormal posture: Secondary | ICD-10-CM | POA: Diagnosis not present

## 2022-01-11 DIAGNOSIS — G301 Alzheimer's disease with late onset: Secondary | ICD-10-CM | POA: Diagnosis not present

## 2022-01-11 DIAGNOSIS — R293 Abnormal posture: Secondary | ICD-10-CM | POA: Diagnosis not present

## 2022-01-11 DIAGNOSIS — Z9181 History of falling: Secondary | ICD-10-CM | POA: Diagnosis not present

## 2022-01-11 DIAGNOSIS — R2689 Other abnormalities of gait and mobility: Secondary | ICD-10-CM | POA: Diagnosis not present

## 2022-01-11 DIAGNOSIS — F0390 Unspecified dementia without behavioral disturbance: Secondary | ICD-10-CM | POA: Diagnosis not present

## 2022-01-11 DIAGNOSIS — F339 Major depressive disorder, recurrent, unspecified: Secondary | ICD-10-CM | POA: Diagnosis not present

## 2022-01-11 DIAGNOSIS — F0280 Dementia in other diseases classified elsewhere without behavioral disturbance: Secondary | ICD-10-CM | POA: Diagnosis not present

## 2022-01-11 DIAGNOSIS — M6281 Muscle weakness (generalized): Secondary | ICD-10-CM | POA: Diagnosis not present

## 2022-01-14 DIAGNOSIS — R293 Abnormal posture: Secondary | ICD-10-CM | POA: Diagnosis not present

## 2022-01-14 DIAGNOSIS — G301 Alzheimer's disease with late onset: Secondary | ICD-10-CM | POA: Diagnosis not present

## 2022-01-14 DIAGNOSIS — Z9181 History of falling: Secondary | ICD-10-CM | POA: Diagnosis not present

## 2022-01-14 DIAGNOSIS — M6281 Muscle weakness (generalized): Secondary | ICD-10-CM | POA: Diagnosis not present

## 2022-01-14 DIAGNOSIS — R2689 Other abnormalities of gait and mobility: Secondary | ICD-10-CM | POA: Diagnosis not present

## 2022-01-15 DIAGNOSIS — R293 Abnormal posture: Secondary | ICD-10-CM | POA: Diagnosis not present

## 2022-01-15 DIAGNOSIS — Z9181 History of falling: Secondary | ICD-10-CM | POA: Diagnosis not present

## 2022-01-15 DIAGNOSIS — R2689 Other abnormalities of gait and mobility: Secondary | ICD-10-CM | POA: Diagnosis not present

## 2022-01-15 DIAGNOSIS — G301 Alzheimer's disease with late onset: Secondary | ICD-10-CM | POA: Diagnosis not present

## 2022-01-15 DIAGNOSIS — M6281 Muscle weakness (generalized): Secondary | ICD-10-CM | POA: Diagnosis not present

## 2022-01-16 DIAGNOSIS — M6281 Muscle weakness (generalized): Secondary | ICD-10-CM | POA: Diagnosis not present

## 2022-01-16 DIAGNOSIS — R293 Abnormal posture: Secondary | ICD-10-CM | POA: Diagnosis not present

## 2022-01-16 DIAGNOSIS — R2689 Other abnormalities of gait and mobility: Secondary | ICD-10-CM | POA: Diagnosis not present

## 2022-01-16 DIAGNOSIS — Z9181 History of falling: Secondary | ICD-10-CM | POA: Diagnosis not present

## 2022-01-16 DIAGNOSIS — G301 Alzheimer's disease with late onset: Secondary | ICD-10-CM | POA: Diagnosis not present

## 2022-01-17 DIAGNOSIS — M6281 Muscle weakness (generalized): Secondary | ICD-10-CM | POA: Diagnosis not present

## 2022-01-17 DIAGNOSIS — R293 Abnormal posture: Secondary | ICD-10-CM | POA: Diagnosis not present

## 2022-01-17 DIAGNOSIS — Z9181 History of falling: Secondary | ICD-10-CM | POA: Diagnosis not present

## 2022-01-17 DIAGNOSIS — R2689 Other abnormalities of gait and mobility: Secondary | ICD-10-CM | POA: Diagnosis not present

## 2022-01-17 DIAGNOSIS — G301 Alzheimer's disease with late onset: Secondary | ICD-10-CM | POA: Diagnosis not present

## 2022-01-18 DIAGNOSIS — R293 Abnormal posture: Secondary | ICD-10-CM | POA: Diagnosis not present

## 2022-01-18 DIAGNOSIS — F339 Major depressive disorder, recurrent, unspecified: Secondary | ICD-10-CM | POA: Diagnosis not present

## 2022-01-18 DIAGNOSIS — Z9181 History of falling: Secondary | ICD-10-CM | POA: Diagnosis not present

## 2022-01-18 DIAGNOSIS — G301 Alzheimer's disease with late onset: Secondary | ICD-10-CM | POA: Diagnosis not present

## 2022-01-18 DIAGNOSIS — M6281 Muscle weakness (generalized): Secondary | ICD-10-CM | POA: Diagnosis not present

## 2022-01-18 DIAGNOSIS — R2689 Other abnormalities of gait and mobility: Secondary | ICD-10-CM | POA: Diagnosis not present

## 2022-01-20 DIAGNOSIS — Z9181 History of falling: Secondary | ICD-10-CM | POA: Diagnosis not present

## 2022-01-20 DIAGNOSIS — R293 Abnormal posture: Secondary | ICD-10-CM | POA: Diagnosis not present

## 2022-01-20 DIAGNOSIS — G301 Alzheimer's disease with late onset: Secondary | ICD-10-CM | POA: Diagnosis not present

## 2022-01-20 DIAGNOSIS — M6281 Muscle weakness (generalized): Secondary | ICD-10-CM | POA: Diagnosis not present

## 2022-01-20 DIAGNOSIS — R2689 Other abnormalities of gait and mobility: Secondary | ICD-10-CM | POA: Diagnosis not present

## 2022-01-22 DIAGNOSIS — Q845 Enlarged and hypertrophic nails: Secondary | ICD-10-CM | POA: Diagnosis not present

## 2022-01-22 DIAGNOSIS — S9032XA Contusion of left foot, initial encounter: Secondary | ICD-10-CM | POA: Diagnosis not present

## 2022-01-22 DIAGNOSIS — M79672 Pain in left foot: Secondary | ICD-10-CM | POA: Diagnosis not present

## 2022-01-23 DIAGNOSIS — Z9181 History of falling: Secondary | ICD-10-CM | POA: Diagnosis not present

## 2022-01-23 DIAGNOSIS — G301 Alzheimer's disease with late onset: Secondary | ICD-10-CM | POA: Diagnosis not present

## 2022-01-23 DIAGNOSIS — M6281 Muscle weakness (generalized): Secondary | ICD-10-CM | POA: Diagnosis not present

## 2022-01-23 DIAGNOSIS — R293 Abnormal posture: Secondary | ICD-10-CM | POA: Diagnosis not present

## 2022-01-23 DIAGNOSIS — R2689 Other abnormalities of gait and mobility: Secondary | ICD-10-CM | POA: Diagnosis not present

## 2022-01-24 DIAGNOSIS — Z9181 History of falling: Secondary | ICD-10-CM | POA: Diagnosis not present

## 2022-01-24 DIAGNOSIS — M6281 Muscle weakness (generalized): Secondary | ICD-10-CM | POA: Diagnosis not present

## 2022-01-24 DIAGNOSIS — G301 Alzheimer's disease with late onset: Secondary | ICD-10-CM | POA: Diagnosis not present

## 2022-01-24 DIAGNOSIS — R2689 Other abnormalities of gait and mobility: Secondary | ICD-10-CM | POA: Diagnosis not present

## 2022-01-24 DIAGNOSIS — R293 Abnormal posture: Secondary | ICD-10-CM | POA: Diagnosis not present

## 2022-01-25 DIAGNOSIS — G301 Alzheimer's disease with late onset: Secondary | ICD-10-CM | POA: Diagnosis not present

## 2022-01-25 DIAGNOSIS — Z9181 History of falling: Secondary | ICD-10-CM | POA: Diagnosis not present

## 2022-01-25 DIAGNOSIS — R293 Abnormal posture: Secondary | ICD-10-CM | POA: Diagnosis not present

## 2022-01-25 DIAGNOSIS — R2689 Other abnormalities of gait and mobility: Secondary | ICD-10-CM | POA: Diagnosis not present

## 2022-01-25 DIAGNOSIS — M6281 Muscle weakness (generalized): Secondary | ICD-10-CM | POA: Diagnosis not present

## 2022-01-26 DIAGNOSIS — G301 Alzheimer's disease with late onset: Secondary | ICD-10-CM | POA: Diagnosis not present

## 2022-01-26 DIAGNOSIS — Z9181 History of falling: Secondary | ICD-10-CM | POA: Diagnosis not present

## 2022-01-26 DIAGNOSIS — R2689 Other abnormalities of gait and mobility: Secondary | ICD-10-CM | POA: Diagnosis not present

## 2022-01-26 DIAGNOSIS — R293 Abnormal posture: Secondary | ICD-10-CM | POA: Diagnosis not present

## 2022-01-26 DIAGNOSIS — M6281 Muscle weakness (generalized): Secondary | ICD-10-CM | POA: Diagnosis not present

## 2022-01-27 DIAGNOSIS — R2689 Other abnormalities of gait and mobility: Secondary | ICD-10-CM | POA: Diagnosis not present

## 2022-01-27 DIAGNOSIS — M6281 Muscle weakness (generalized): Secondary | ICD-10-CM | POA: Diagnosis not present

## 2022-01-27 DIAGNOSIS — Z9181 History of falling: Secondary | ICD-10-CM | POA: Diagnosis not present

## 2022-01-27 DIAGNOSIS — G301 Alzheimer's disease with late onset: Secondary | ICD-10-CM | POA: Diagnosis not present

## 2022-01-27 DIAGNOSIS — R293 Abnormal posture: Secondary | ICD-10-CM | POA: Diagnosis not present

## 2022-01-29 DIAGNOSIS — G301 Alzheimer's disease with late onset: Secondary | ICD-10-CM | POA: Diagnosis not present

## 2022-01-29 DIAGNOSIS — M6281 Muscle weakness (generalized): Secondary | ICD-10-CM | POA: Diagnosis not present

## 2022-01-29 DIAGNOSIS — R2689 Other abnormalities of gait and mobility: Secondary | ICD-10-CM | POA: Diagnosis not present

## 2022-01-29 DIAGNOSIS — Z9181 History of falling: Secondary | ICD-10-CM | POA: Diagnosis not present

## 2022-01-29 DIAGNOSIS — R293 Abnormal posture: Secondary | ICD-10-CM | POA: Diagnosis not present

## 2022-01-30 DIAGNOSIS — G301 Alzheimer's disease with late onset: Secondary | ICD-10-CM | POA: Diagnosis not present

## 2022-01-30 DIAGNOSIS — M6281 Muscle weakness (generalized): Secondary | ICD-10-CM | POA: Diagnosis not present

## 2022-01-30 DIAGNOSIS — R293 Abnormal posture: Secondary | ICD-10-CM | POA: Diagnosis not present

## 2022-01-30 DIAGNOSIS — Z9181 History of falling: Secondary | ICD-10-CM | POA: Diagnosis not present

## 2022-01-30 DIAGNOSIS — R2689 Other abnormalities of gait and mobility: Secondary | ICD-10-CM | POA: Diagnosis not present

## 2022-02-01 DIAGNOSIS — R2689 Other abnormalities of gait and mobility: Secondary | ICD-10-CM | POA: Diagnosis not present

## 2022-02-01 DIAGNOSIS — R293 Abnormal posture: Secondary | ICD-10-CM | POA: Diagnosis not present

## 2022-02-01 DIAGNOSIS — M6281 Muscle weakness (generalized): Secondary | ICD-10-CM | POA: Diagnosis not present

## 2022-02-01 DIAGNOSIS — Z9181 History of falling: Secondary | ICD-10-CM | POA: Diagnosis not present

## 2022-02-01 DIAGNOSIS — G301 Alzheimer's disease with late onset: Secondary | ICD-10-CM | POA: Diagnosis not present

## 2022-02-02 DIAGNOSIS — R293 Abnormal posture: Secondary | ICD-10-CM | POA: Diagnosis not present

## 2022-02-02 DIAGNOSIS — R2689 Other abnormalities of gait and mobility: Secondary | ICD-10-CM | POA: Diagnosis not present

## 2022-02-02 DIAGNOSIS — Z9181 History of falling: Secondary | ICD-10-CM | POA: Diagnosis not present

## 2022-02-02 DIAGNOSIS — M6281 Muscle weakness (generalized): Secondary | ICD-10-CM | POA: Diagnosis not present

## 2022-02-02 DIAGNOSIS — G301 Alzheimer's disease with late onset: Secondary | ICD-10-CM | POA: Diagnosis not present

## 2022-02-05 DIAGNOSIS — M6281 Muscle weakness (generalized): Secondary | ICD-10-CM | POA: Diagnosis not present

## 2022-02-05 DIAGNOSIS — Z9181 History of falling: Secondary | ICD-10-CM | POA: Diagnosis not present

## 2022-02-05 DIAGNOSIS — R293 Abnormal posture: Secondary | ICD-10-CM | POA: Diagnosis not present

## 2022-02-05 DIAGNOSIS — G301 Alzheimer's disease with late onset: Secondary | ICD-10-CM | POA: Diagnosis not present

## 2022-02-05 DIAGNOSIS — R2689 Other abnormalities of gait and mobility: Secondary | ICD-10-CM | POA: Diagnosis not present

## 2022-02-06 DIAGNOSIS — M6281 Muscle weakness (generalized): Secondary | ICD-10-CM | POA: Diagnosis not present

## 2022-02-06 DIAGNOSIS — G301 Alzheimer's disease with late onset: Secondary | ICD-10-CM | POA: Diagnosis not present

## 2022-02-06 DIAGNOSIS — R2689 Other abnormalities of gait and mobility: Secondary | ICD-10-CM | POA: Diagnosis not present

## 2022-02-06 DIAGNOSIS — Z9181 History of falling: Secondary | ICD-10-CM | POA: Diagnosis not present

## 2022-02-06 DIAGNOSIS — R293 Abnormal posture: Secondary | ICD-10-CM | POA: Diagnosis not present

## 2022-02-07 DIAGNOSIS — Z9181 History of falling: Secondary | ICD-10-CM | POA: Diagnosis not present

## 2022-02-07 DIAGNOSIS — M6281 Muscle weakness (generalized): Secondary | ICD-10-CM | POA: Diagnosis not present

## 2022-02-07 DIAGNOSIS — R2689 Other abnormalities of gait and mobility: Secondary | ICD-10-CM | POA: Diagnosis not present

## 2022-02-07 DIAGNOSIS — R293 Abnormal posture: Secondary | ICD-10-CM | POA: Diagnosis not present

## 2022-02-07 DIAGNOSIS — G301 Alzheimer's disease with late onset: Secondary | ICD-10-CM | POA: Diagnosis not present

## 2022-02-08 DIAGNOSIS — R2689 Other abnormalities of gait and mobility: Secondary | ICD-10-CM | POA: Diagnosis not present

## 2022-02-08 DIAGNOSIS — M6281 Muscle weakness (generalized): Secondary | ICD-10-CM | POA: Diagnosis not present

## 2022-02-09 DIAGNOSIS — M6281 Muscle weakness (generalized): Secondary | ICD-10-CM | POA: Diagnosis not present

## 2022-02-09 DIAGNOSIS — R2689 Other abnormalities of gait and mobility: Secondary | ICD-10-CM | POA: Diagnosis not present

## 2022-02-10 DIAGNOSIS — M6281 Muscle weakness (generalized): Secondary | ICD-10-CM | POA: Diagnosis not present

## 2022-02-10 DIAGNOSIS — R2689 Other abnormalities of gait and mobility: Secondary | ICD-10-CM | POA: Diagnosis not present

## 2022-02-13 DIAGNOSIS — G301 Alzheimer's disease with late onset: Secondary | ICD-10-CM | POA: Diagnosis not present

## 2022-02-13 DIAGNOSIS — I739 Peripheral vascular disease, unspecified: Secondary | ICD-10-CM | POA: Diagnosis not present

## 2022-02-13 DIAGNOSIS — E039 Hypothyroidism, unspecified: Secondary | ICD-10-CM | POA: Diagnosis not present

## 2022-02-13 DIAGNOSIS — M6281 Muscle weakness (generalized): Secondary | ICD-10-CM | POA: Diagnosis not present

## 2022-02-13 DIAGNOSIS — R2689 Other abnormalities of gait and mobility: Secondary | ICD-10-CM | POA: Diagnosis not present

## 2022-02-13 DIAGNOSIS — F0280 Dementia in other diseases classified elsewhere without behavioral disturbance: Secondary | ICD-10-CM | POA: Diagnosis not present

## 2022-02-14 DIAGNOSIS — M6281 Muscle weakness (generalized): Secondary | ICD-10-CM | POA: Diagnosis not present

## 2022-02-14 DIAGNOSIS — M79675 Pain in left toe(s): Secondary | ICD-10-CM | POA: Diagnosis not present

## 2022-02-14 DIAGNOSIS — M79674 Pain in right toe(s): Secondary | ICD-10-CM | POA: Diagnosis not present

## 2022-02-14 DIAGNOSIS — B351 Tinea unguium: Secondary | ICD-10-CM | POA: Diagnosis not present

## 2022-02-14 DIAGNOSIS — R2689 Other abnormalities of gait and mobility: Secondary | ICD-10-CM | POA: Diagnosis not present

## 2022-02-15 DIAGNOSIS — R2689 Other abnormalities of gait and mobility: Secondary | ICD-10-CM | POA: Diagnosis not present

## 2022-02-15 DIAGNOSIS — M6281 Muscle weakness (generalized): Secondary | ICD-10-CM | POA: Diagnosis not present

## 2022-02-16 DIAGNOSIS — R2689 Other abnormalities of gait and mobility: Secondary | ICD-10-CM | POA: Diagnosis not present

## 2022-02-16 DIAGNOSIS — M6281 Muscle weakness (generalized): Secondary | ICD-10-CM | POA: Diagnosis not present

## 2022-02-17 DIAGNOSIS — M6281 Muscle weakness (generalized): Secondary | ICD-10-CM | POA: Diagnosis not present

## 2022-02-17 DIAGNOSIS — R2689 Other abnormalities of gait and mobility: Secondary | ICD-10-CM | POA: Diagnosis not present

## 2022-02-20 DIAGNOSIS — M6281 Muscle weakness (generalized): Secondary | ICD-10-CM | POA: Diagnosis not present

## 2022-02-20 DIAGNOSIS — R2689 Other abnormalities of gait and mobility: Secondary | ICD-10-CM | POA: Diagnosis not present

## 2022-02-21 DIAGNOSIS — R2689 Other abnormalities of gait and mobility: Secondary | ICD-10-CM | POA: Diagnosis not present

## 2022-02-21 DIAGNOSIS — M6281 Muscle weakness (generalized): Secondary | ICD-10-CM | POA: Diagnosis not present

## 2022-02-22 DIAGNOSIS — R2689 Other abnormalities of gait and mobility: Secondary | ICD-10-CM | POA: Diagnosis not present

## 2022-02-22 DIAGNOSIS — M6281 Muscle weakness (generalized): Secondary | ICD-10-CM | POA: Diagnosis not present

## 2022-02-23 DIAGNOSIS — M6281 Muscle weakness (generalized): Secondary | ICD-10-CM | POA: Diagnosis not present

## 2022-02-23 DIAGNOSIS — R2689 Other abnormalities of gait and mobility: Secondary | ICD-10-CM | POA: Diagnosis not present

## 2022-02-24 DIAGNOSIS — M6281 Muscle weakness (generalized): Secondary | ICD-10-CM | POA: Diagnosis not present

## 2022-02-24 DIAGNOSIS — R2689 Other abnormalities of gait and mobility: Secondary | ICD-10-CM | POA: Diagnosis not present

## 2022-02-27 ENCOUNTER — Ambulatory Visit: Payer: Medicare Other | Admitting: Podiatrist

## 2022-02-27 DIAGNOSIS — M6281 Muscle weakness (generalized): Secondary | ICD-10-CM | POA: Diagnosis not present

## 2022-02-27 DIAGNOSIS — R2689 Other abnormalities of gait and mobility: Secondary | ICD-10-CM | POA: Diagnosis not present

## 2022-02-28 DIAGNOSIS — Z5181 Encounter for therapeutic drug level monitoring: Secondary | ICD-10-CM | POA: Diagnosis not present

## 2022-02-28 DIAGNOSIS — Z79899 Other long term (current) drug therapy: Secondary | ICD-10-CM | POA: Diagnosis not present

## 2022-02-28 DIAGNOSIS — R2689 Other abnormalities of gait and mobility: Secondary | ICD-10-CM | POA: Diagnosis not present

## 2022-02-28 DIAGNOSIS — M6281 Muscle weakness (generalized): Secondary | ICD-10-CM | POA: Diagnosis not present

## 2022-03-01 DIAGNOSIS — M6281 Muscle weakness (generalized): Secondary | ICD-10-CM | POA: Diagnosis not present

## 2022-03-01 DIAGNOSIS — R2689 Other abnormalities of gait and mobility: Secondary | ICD-10-CM | POA: Diagnosis not present

## 2022-03-02 DIAGNOSIS — R2689 Other abnormalities of gait and mobility: Secondary | ICD-10-CM | POA: Diagnosis not present

## 2022-03-02 DIAGNOSIS — M6281 Muscle weakness (generalized): Secondary | ICD-10-CM | POA: Diagnosis not present

## 2022-03-03 DIAGNOSIS — M6281 Muscle weakness (generalized): Secondary | ICD-10-CM | POA: Diagnosis not present

## 2022-03-03 DIAGNOSIS — R2689 Other abnormalities of gait and mobility: Secondary | ICD-10-CM | POA: Diagnosis not present

## 2022-03-05 DIAGNOSIS — M6281 Muscle weakness (generalized): Secondary | ICD-10-CM | POA: Diagnosis not present

## 2022-03-05 DIAGNOSIS — R2689 Other abnormalities of gait and mobility: Secondary | ICD-10-CM | POA: Diagnosis not present

## 2022-03-06 DIAGNOSIS — R2689 Other abnormalities of gait and mobility: Secondary | ICD-10-CM | POA: Diagnosis not present

## 2022-03-06 DIAGNOSIS — M6281 Muscle weakness (generalized): Secondary | ICD-10-CM | POA: Diagnosis not present

## 2022-03-09 DIAGNOSIS — R2689 Other abnormalities of gait and mobility: Secondary | ICD-10-CM | POA: Diagnosis not present

## 2022-03-09 DIAGNOSIS — M6281 Muscle weakness (generalized): Secondary | ICD-10-CM | POA: Diagnosis not present

## 2022-03-12 DIAGNOSIS — M6281 Muscle weakness (generalized): Secondary | ICD-10-CM | POA: Diagnosis not present

## 2022-03-12 DIAGNOSIS — R2689 Other abnormalities of gait and mobility: Secondary | ICD-10-CM | POA: Diagnosis not present

## 2022-03-14 DIAGNOSIS — M6281 Muscle weakness (generalized): Secondary | ICD-10-CM | POA: Diagnosis not present

## 2022-03-14 DIAGNOSIS — R2689 Other abnormalities of gait and mobility: Secondary | ICD-10-CM | POA: Diagnosis not present

## 2022-03-15 DIAGNOSIS — I739 Peripheral vascular disease, unspecified: Secondary | ICD-10-CM | POA: Diagnosis not present

## 2022-03-15 DIAGNOSIS — F339 Major depressive disorder, recurrent, unspecified: Secondary | ICD-10-CM | POA: Diagnosis not present

## 2022-03-15 DIAGNOSIS — F0390 Unspecified dementia without behavioral disturbance: Secondary | ICD-10-CM | POA: Diagnosis not present

## 2022-03-15 DIAGNOSIS — R2689 Other abnormalities of gait and mobility: Secondary | ICD-10-CM | POA: Diagnosis not present

## 2022-03-15 DIAGNOSIS — M6281 Muscle weakness (generalized): Secondary | ICD-10-CM | POA: Diagnosis not present

## 2022-03-15 DIAGNOSIS — M1990 Unspecified osteoarthritis, unspecified site: Secondary | ICD-10-CM | POA: Diagnosis not present

## 2022-03-15 DIAGNOSIS — L97829 Non-pressure chronic ulcer of other part of left lower leg with unspecified severity: Secondary | ICD-10-CM | POA: Diagnosis not present

## 2022-03-19 DIAGNOSIS — R2689 Other abnormalities of gait and mobility: Secondary | ICD-10-CM | POA: Diagnosis not present

## 2022-03-19 DIAGNOSIS — M6281 Muscle weakness (generalized): Secondary | ICD-10-CM | POA: Diagnosis not present

## 2022-03-20 DIAGNOSIS — R2689 Other abnormalities of gait and mobility: Secondary | ICD-10-CM | POA: Diagnosis not present

## 2022-03-20 DIAGNOSIS — M6281 Muscle weakness (generalized): Secondary | ICD-10-CM | POA: Diagnosis not present

## 2022-03-21 DIAGNOSIS — R2689 Other abnormalities of gait and mobility: Secondary | ICD-10-CM | POA: Diagnosis not present

## 2022-03-21 DIAGNOSIS — M6281 Muscle weakness (generalized): Secondary | ICD-10-CM | POA: Diagnosis not present

## 2022-03-22 DIAGNOSIS — L97829 Non-pressure chronic ulcer of other part of left lower leg with unspecified severity: Secondary | ICD-10-CM | POA: Diagnosis not present

## 2022-03-22 DIAGNOSIS — R230 Cyanosis: Secondary | ICD-10-CM | POA: Diagnosis not present

## 2022-03-22 DIAGNOSIS — L97811 Non-pressure chronic ulcer of other part of right lower leg limited to breakdown of skin: Secondary | ICD-10-CM | POA: Diagnosis not present

## 2022-03-22 DIAGNOSIS — I70248 Atherosclerosis of native arteries of left leg with ulceration of other part of lower left leg: Secondary | ICD-10-CM | POA: Diagnosis not present

## 2022-03-22 DIAGNOSIS — M6281 Muscle weakness (generalized): Secondary | ICD-10-CM | POA: Diagnosis not present

## 2022-03-22 DIAGNOSIS — R2689 Other abnormalities of gait and mobility: Secondary | ICD-10-CM | POA: Diagnosis not present

## 2022-03-22 DIAGNOSIS — L97821 Non-pressure chronic ulcer of other part of left lower leg limited to breakdown of skin: Secondary | ICD-10-CM | POA: Diagnosis not present

## 2022-03-26 DIAGNOSIS — M6281 Muscle weakness (generalized): Secondary | ICD-10-CM | POA: Diagnosis not present

## 2022-03-26 DIAGNOSIS — R2689 Other abnormalities of gait and mobility: Secondary | ICD-10-CM | POA: Diagnosis not present

## 2022-03-27 DIAGNOSIS — R2689 Other abnormalities of gait and mobility: Secondary | ICD-10-CM | POA: Diagnosis not present

## 2022-03-27 DIAGNOSIS — M6281 Muscle weakness (generalized): Secondary | ICD-10-CM | POA: Diagnosis not present

## 2022-03-27 DIAGNOSIS — I10 Essential (primary) hypertension: Secondary | ICD-10-CM | POA: Diagnosis not present

## 2022-03-29 DIAGNOSIS — R2689 Other abnormalities of gait and mobility: Secondary | ICD-10-CM | POA: Diagnosis not present

## 2022-03-29 DIAGNOSIS — M6281 Muscle weakness (generalized): Secondary | ICD-10-CM | POA: Diagnosis not present

## 2022-03-29 DIAGNOSIS — L97829 Non-pressure chronic ulcer of other part of left lower leg with unspecified severity: Secondary | ICD-10-CM | POA: Diagnosis not present

## 2022-04-02 DIAGNOSIS — M6281 Muscle weakness (generalized): Secondary | ICD-10-CM | POA: Diagnosis not present

## 2022-04-02 DIAGNOSIS — R2689 Other abnormalities of gait and mobility: Secondary | ICD-10-CM | POA: Diagnosis not present

## 2022-04-03 DIAGNOSIS — I10 Essential (primary) hypertension: Secondary | ICD-10-CM | POA: Diagnosis not present

## 2022-04-04 DIAGNOSIS — R2689 Other abnormalities of gait and mobility: Secondary | ICD-10-CM | POA: Diagnosis not present

## 2022-04-04 DIAGNOSIS — M6281 Muscle weakness (generalized): Secondary | ICD-10-CM | POA: Diagnosis not present

## 2022-04-05 DIAGNOSIS — L97829 Non-pressure chronic ulcer of other part of left lower leg with unspecified severity: Secondary | ICD-10-CM | POA: Diagnosis not present

## 2022-04-06 DIAGNOSIS — R2689 Other abnormalities of gait and mobility: Secondary | ICD-10-CM | POA: Diagnosis not present

## 2022-04-06 DIAGNOSIS — M6281 Muscle weakness (generalized): Secondary | ICD-10-CM | POA: Diagnosis not present

## 2022-04-09 DIAGNOSIS — R2689 Other abnormalities of gait and mobility: Secondary | ICD-10-CM | POA: Diagnosis not present

## 2022-04-09 DIAGNOSIS — M6281 Muscle weakness (generalized): Secondary | ICD-10-CM | POA: Diagnosis not present

## 2022-04-10 DIAGNOSIS — G301 Alzheimer's disease with late onset: Secondary | ICD-10-CM | POA: Diagnosis not present

## 2022-04-10 DIAGNOSIS — R2689 Other abnormalities of gait and mobility: Secondary | ICD-10-CM | POA: Diagnosis not present

## 2022-04-10 DIAGNOSIS — M6281 Muscle weakness (generalized): Secondary | ICD-10-CM | POA: Diagnosis not present

## 2022-04-12 DIAGNOSIS — G301 Alzheimer's disease with late onset: Secondary | ICD-10-CM | POA: Diagnosis not present

## 2022-04-12 DIAGNOSIS — L97829 Non-pressure chronic ulcer of other part of left lower leg with unspecified severity: Secondary | ICD-10-CM | POA: Diagnosis not present

## 2022-04-12 DIAGNOSIS — M6281 Muscle weakness (generalized): Secondary | ICD-10-CM | POA: Diagnosis not present

## 2022-04-12 DIAGNOSIS — R2689 Other abnormalities of gait and mobility: Secondary | ICD-10-CM | POA: Diagnosis not present

## 2022-04-16 DIAGNOSIS — M6281 Muscle weakness (generalized): Secondary | ICD-10-CM | POA: Diagnosis not present

## 2022-04-16 DIAGNOSIS — R2689 Other abnormalities of gait and mobility: Secondary | ICD-10-CM | POA: Diagnosis not present

## 2022-04-16 DIAGNOSIS — G301 Alzheimer's disease with late onset: Secondary | ICD-10-CM | POA: Diagnosis not present

## 2022-04-17 DIAGNOSIS — R2689 Other abnormalities of gait and mobility: Secondary | ICD-10-CM | POA: Diagnosis not present

## 2022-04-17 DIAGNOSIS — M6281 Muscle weakness (generalized): Secondary | ICD-10-CM | POA: Diagnosis not present

## 2022-04-17 DIAGNOSIS — I10 Essential (primary) hypertension: Secondary | ICD-10-CM | POA: Diagnosis not present

## 2022-04-17 DIAGNOSIS — I739 Peripheral vascular disease, unspecified: Secondary | ICD-10-CM | POA: Diagnosis not present

## 2022-04-17 DIAGNOSIS — F0390 Unspecified dementia without behavioral disturbance: Secondary | ICD-10-CM | POA: Diagnosis not present

## 2022-04-17 DIAGNOSIS — G301 Alzheimer's disease with late onset: Secondary | ICD-10-CM | POA: Diagnosis not present

## 2022-04-18 DIAGNOSIS — G301 Alzheimer's disease with late onset: Secondary | ICD-10-CM | POA: Diagnosis not present

## 2022-04-18 DIAGNOSIS — R2689 Other abnormalities of gait and mobility: Secondary | ICD-10-CM | POA: Diagnosis not present

## 2022-04-18 DIAGNOSIS — M6281 Muscle weakness (generalized): Secondary | ICD-10-CM | POA: Diagnosis not present

## 2022-04-19 DIAGNOSIS — L97829 Non-pressure chronic ulcer of other part of left lower leg with unspecified severity: Secondary | ICD-10-CM | POA: Diagnosis not present

## 2022-04-19 DIAGNOSIS — R2689 Other abnormalities of gait and mobility: Secondary | ICD-10-CM | POA: Diagnosis not present

## 2022-04-19 DIAGNOSIS — M6281 Muscle weakness (generalized): Secondary | ICD-10-CM | POA: Diagnosis not present

## 2022-04-19 DIAGNOSIS — G301 Alzheimer's disease with late onset: Secondary | ICD-10-CM | POA: Diagnosis not present

## 2022-04-20 DIAGNOSIS — R2689 Other abnormalities of gait and mobility: Secondary | ICD-10-CM | POA: Diagnosis not present

## 2022-04-20 DIAGNOSIS — M6281 Muscle weakness (generalized): Secondary | ICD-10-CM | POA: Diagnosis not present

## 2022-04-20 DIAGNOSIS — G301 Alzheimer's disease with late onset: Secondary | ICD-10-CM | POA: Diagnosis not present

## 2022-04-21 DIAGNOSIS — R2689 Other abnormalities of gait and mobility: Secondary | ICD-10-CM | POA: Diagnosis not present

## 2022-04-21 DIAGNOSIS — M6281 Muscle weakness (generalized): Secondary | ICD-10-CM | POA: Diagnosis not present

## 2022-04-21 DIAGNOSIS — G301 Alzheimer's disease with late onset: Secondary | ICD-10-CM | POA: Diagnosis not present

## 2022-04-26 DIAGNOSIS — L97829 Non-pressure chronic ulcer of other part of left lower leg with unspecified severity: Secondary | ICD-10-CM | POA: Diagnosis not present

## 2022-05-03 DIAGNOSIS — L97829 Non-pressure chronic ulcer of other part of left lower leg with unspecified severity: Secondary | ICD-10-CM | POA: Diagnosis not present

## 2022-05-03 DIAGNOSIS — E039 Hypothyroidism, unspecified: Secondary | ICD-10-CM | POA: Diagnosis not present

## 2022-05-03 DIAGNOSIS — E559 Vitamin D deficiency, unspecified: Secondary | ICD-10-CM | POA: Diagnosis not present

## 2022-05-10 DIAGNOSIS — L97829 Non-pressure chronic ulcer of other part of left lower leg with unspecified severity: Secondary | ICD-10-CM | POA: Diagnosis not present

## 2022-05-17 DIAGNOSIS — L97829 Non-pressure chronic ulcer of other part of left lower leg with unspecified severity: Secondary | ICD-10-CM | POA: Diagnosis not present

## 2022-05-17 DIAGNOSIS — Z139 Encounter for screening, unspecified: Secondary | ICD-10-CM | POA: Diagnosis not present

## 2022-05-17 DIAGNOSIS — Z136 Encounter for screening for cardiovascular disorders: Secondary | ICD-10-CM | POA: Diagnosis not present

## 2022-05-17 DIAGNOSIS — Z0000 Encounter for general adult medical examination without abnormal findings: Secondary | ICD-10-CM | POA: Diagnosis not present

## 2022-05-17 DIAGNOSIS — Z1331 Encounter for screening for depression: Secondary | ICD-10-CM | POA: Diagnosis not present

## 2022-05-17 DIAGNOSIS — Z1339 Encounter for screening examination for other mental health and behavioral disorders: Secondary | ICD-10-CM | POA: Diagnosis not present

## 2022-05-21 DIAGNOSIS — F0390 Unspecified dementia without behavioral disturbance: Secondary | ICD-10-CM | POA: Diagnosis not present

## 2022-05-21 DIAGNOSIS — I10 Essential (primary) hypertension: Secondary | ICD-10-CM | POA: Diagnosis not present

## 2022-05-21 DIAGNOSIS — F339 Major depressive disorder, recurrent, unspecified: Secondary | ICD-10-CM | POA: Diagnosis not present

## 2022-05-21 DIAGNOSIS — E039 Hypothyroidism, unspecified: Secondary | ICD-10-CM | POA: Diagnosis not present

## 2022-05-24 DIAGNOSIS — L97829 Non-pressure chronic ulcer of other part of left lower leg with unspecified severity: Secondary | ICD-10-CM | POA: Diagnosis not present

## 2022-05-31 DIAGNOSIS — L97829 Non-pressure chronic ulcer of other part of left lower leg with unspecified severity: Secondary | ICD-10-CM | POA: Diagnosis not present

## 2022-06-07 DIAGNOSIS — L97829 Non-pressure chronic ulcer of other part of left lower leg with unspecified severity: Secondary | ICD-10-CM | POA: Diagnosis not present

## 2022-06-14 DIAGNOSIS — L97829 Non-pressure chronic ulcer of other part of left lower leg with unspecified severity: Secondary | ICD-10-CM | POA: Diagnosis not present

## 2022-06-21 DIAGNOSIS — E039 Hypothyroidism, unspecified: Secondary | ICD-10-CM | POA: Diagnosis not present

## 2022-06-21 DIAGNOSIS — F0280 Dementia in other diseases classified elsewhere without behavioral disturbance: Secondary | ICD-10-CM | POA: Diagnosis not present

## 2022-06-21 DIAGNOSIS — G301 Alzheimer's disease with late onset: Secondary | ICD-10-CM | POA: Diagnosis not present

## 2022-06-21 DIAGNOSIS — I739 Peripheral vascular disease, unspecified: Secondary | ICD-10-CM | POA: Diagnosis not present

## 2022-07-10 DIAGNOSIS — R2689 Other abnormalities of gait and mobility: Secondary | ICD-10-CM | POA: Diagnosis not present

## 2022-07-10 DIAGNOSIS — M6281 Muscle weakness (generalized): Secondary | ICD-10-CM | POA: Diagnosis not present

## 2022-07-10 DIAGNOSIS — Z9181 History of falling: Secondary | ICD-10-CM | POA: Diagnosis not present

## 2022-07-11 DIAGNOSIS — M6281 Muscle weakness (generalized): Secondary | ICD-10-CM | POA: Diagnosis not present

## 2022-07-11 DIAGNOSIS — Z9181 History of falling: Secondary | ICD-10-CM | POA: Diagnosis not present

## 2022-07-11 DIAGNOSIS — R2689 Other abnormalities of gait and mobility: Secondary | ICD-10-CM | POA: Diagnosis not present

## 2022-07-12 DIAGNOSIS — Z9181 History of falling: Secondary | ICD-10-CM | POA: Diagnosis not present

## 2022-07-12 DIAGNOSIS — R2689 Other abnormalities of gait and mobility: Secondary | ICD-10-CM | POA: Diagnosis not present

## 2022-07-12 DIAGNOSIS — G309 Alzheimer's disease, unspecified: Secondary | ICD-10-CM | POA: Diagnosis not present

## 2022-07-12 DIAGNOSIS — M6281 Muscle weakness (generalized): Secondary | ICD-10-CM | POA: Diagnosis not present

## 2022-07-12 DIAGNOSIS — I739 Peripheral vascular disease, unspecified: Secondary | ICD-10-CM | POA: Diagnosis not present

## 2022-07-12 DIAGNOSIS — I509 Heart failure, unspecified: Secondary | ICD-10-CM | POA: Diagnosis not present

## 2022-07-12 DIAGNOSIS — L97919 Non-pressure chronic ulcer of unspecified part of right lower leg with unspecified severity: Secondary | ICD-10-CM | POA: Diagnosis not present

## 2022-07-13 DIAGNOSIS — R2689 Other abnormalities of gait and mobility: Secondary | ICD-10-CM | POA: Diagnosis not present

## 2022-07-13 DIAGNOSIS — M6281 Muscle weakness (generalized): Secondary | ICD-10-CM | POA: Diagnosis not present

## 2022-07-13 DIAGNOSIS — Z9181 History of falling: Secondary | ICD-10-CM | POA: Diagnosis not present

## 2022-07-14 DIAGNOSIS — R2689 Other abnormalities of gait and mobility: Secondary | ICD-10-CM | POA: Diagnosis not present

## 2022-07-14 DIAGNOSIS — Z9181 History of falling: Secondary | ICD-10-CM | POA: Diagnosis not present

## 2022-07-14 DIAGNOSIS — M6281 Muscle weakness (generalized): Secondary | ICD-10-CM | POA: Diagnosis not present

## 2022-07-16 DIAGNOSIS — Z9181 History of falling: Secondary | ICD-10-CM | POA: Diagnosis not present

## 2022-07-16 DIAGNOSIS — M6281 Muscle weakness (generalized): Secondary | ICD-10-CM | POA: Diagnosis not present

## 2022-07-16 DIAGNOSIS — R2689 Other abnormalities of gait and mobility: Secondary | ICD-10-CM | POA: Diagnosis not present

## 2022-07-17 DIAGNOSIS — R2689 Other abnormalities of gait and mobility: Secondary | ICD-10-CM | POA: Diagnosis not present

## 2022-07-17 DIAGNOSIS — Z9181 History of falling: Secondary | ICD-10-CM | POA: Diagnosis not present

## 2022-07-17 DIAGNOSIS — M6281 Muscle weakness (generalized): Secondary | ICD-10-CM | POA: Diagnosis not present

## 2022-07-18 DIAGNOSIS — R2689 Other abnormalities of gait and mobility: Secondary | ICD-10-CM | POA: Diagnosis not present

## 2022-07-18 DIAGNOSIS — M6281 Muscle weakness (generalized): Secondary | ICD-10-CM | POA: Diagnosis not present

## 2022-07-18 DIAGNOSIS — Z9181 History of falling: Secondary | ICD-10-CM | POA: Diagnosis not present

## 2022-07-19 DIAGNOSIS — I739 Peripheral vascular disease, unspecified: Secondary | ICD-10-CM | POA: Diagnosis not present

## 2022-07-19 DIAGNOSIS — I509 Heart failure, unspecified: Secondary | ICD-10-CM | POA: Diagnosis not present

## 2022-07-19 DIAGNOSIS — L97929 Non-pressure chronic ulcer of unspecified part of left lower leg with unspecified severity: Secondary | ICD-10-CM | POA: Diagnosis not present

## 2022-07-19 DIAGNOSIS — Z9181 History of falling: Secondary | ICD-10-CM | POA: Diagnosis not present

## 2022-07-19 DIAGNOSIS — G309 Alzheimer's disease, unspecified: Secondary | ICD-10-CM | POA: Diagnosis not present

## 2022-07-19 DIAGNOSIS — R2689 Other abnormalities of gait and mobility: Secondary | ICD-10-CM | POA: Diagnosis not present

## 2022-07-19 DIAGNOSIS — M6281 Muscle weakness (generalized): Secondary | ICD-10-CM | POA: Diagnosis not present

## 2022-07-20 DIAGNOSIS — M6281 Muscle weakness (generalized): Secondary | ICD-10-CM | POA: Diagnosis not present

## 2022-07-20 DIAGNOSIS — Z9181 History of falling: Secondary | ICD-10-CM | POA: Diagnosis not present

## 2022-07-20 DIAGNOSIS — R2689 Other abnormalities of gait and mobility: Secondary | ICD-10-CM | POA: Diagnosis not present

## 2022-07-21 DIAGNOSIS — R2689 Other abnormalities of gait and mobility: Secondary | ICD-10-CM | POA: Diagnosis not present

## 2022-07-21 DIAGNOSIS — Z9181 History of falling: Secondary | ICD-10-CM | POA: Diagnosis not present

## 2022-07-21 DIAGNOSIS — M6281 Muscle weakness (generalized): Secondary | ICD-10-CM | POA: Diagnosis not present

## 2022-07-24 DIAGNOSIS — I10 Essential (primary) hypertension: Secondary | ICD-10-CM | POA: Diagnosis not present

## 2022-07-24 DIAGNOSIS — G301 Alzheimer's disease with late onset: Secondary | ICD-10-CM | POA: Diagnosis not present

## 2022-07-24 DIAGNOSIS — E039 Hypothyroidism, unspecified: Secondary | ICD-10-CM | POA: Diagnosis not present

## 2022-07-24 DIAGNOSIS — M6281 Muscle weakness (generalized): Secondary | ICD-10-CM | POA: Diagnosis not present

## 2022-07-24 DIAGNOSIS — F0280 Dementia in other diseases classified elsewhere without behavioral disturbance: Secondary | ICD-10-CM | POA: Diagnosis not present

## 2022-07-24 DIAGNOSIS — Z9181 History of falling: Secondary | ICD-10-CM | POA: Diagnosis not present

## 2022-07-24 DIAGNOSIS — R2689 Other abnormalities of gait and mobility: Secondary | ICD-10-CM | POA: Diagnosis not present

## 2022-07-25 DIAGNOSIS — M6281 Muscle weakness (generalized): Secondary | ICD-10-CM | POA: Diagnosis not present

## 2022-07-25 DIAGNOSIS — R2689 Other abnormalities of gait and mobility: Secondary | ICD-10-CM | POA: Diagnosis not present

## 2022-07-25 DIAGNOSIS — Z9181 History of falling: Secondary | ICD-10-CM | POA: Diagnosis not present

## 2022-07-26 ENCOUNTER — Encounter: Payer: Medicare Other | Admitting: Family

## 2022-07-26 DIAGNOSIS — M6281 Muscle weakness (generalized): Secondary | ICD-10-CM | POA: Diagnosis not present

## 2022-07-26 DIAGNOSIS — R2689 Other abnormalities of gait and mobility: Secondary | ICD-10-CM | POA: Diagnosis not present

## 2022-07-26 DIAGNOSIS — Z9181 History of falling: Secondary | ICD-10-CM | POA: Diagnosis not present

## 2022-07-27 DIAGNOSIS — Z9181 History of falling: Secondary | ICD-10-CM | POA: Diagnosis not present

## 2022-07-27 DIAGNOSIS — R2689 Other abnormalities of gait and mobility: Secondary | ICD-10-CM | POA: Diagnosis not present

## 2022-07-27 DIAGNOSIS — M6281 Muscle weakness (generalized): Secondary | ICD-10-CM | POA: Diagnosis not present

## 2022-07-28 DIAGNOSIS — M6281 Muscle weakness (generalized): Secondary | ICD-10-CM | POA: Diagnosis not present

## 2022-07-28 DIAGNOSIS — Z9181 History of falling: Secondary | ICD-10-CM | POA: Diagnosis not present

## 2022-07-28 DIAGNOSIS — R2689 Other abnormalities of gait and mobility: Secondary | ICD-10-CM | POA: Diagnosis not present

## 2022-07-31 DIAGNOSIS — M6281 Muscle weakness (generalized): Secondary | ICD-10-CM | POA: Diagnosis not present

## 2022-07-31 DIAGNOSIS — Z9181 History of falling: Secondary | ICD-10-CM | POA: Diagnosis not present

## 2022-07-31 DIAGNOSIS — B351 Tinea unguium: Secondary | ICD-10-CM | POA: Diagnosis not present

## 2022-07-31 DIAGNOSIS — R2689 Other abnormalities of gait and mobility: Secondary | ICD-10-CM | POA: Diagnosis not present

## 2022-07-31 DIAGNOSIS — M79674 Pain in right toe(s): Secondary | ICD-10-CM | POA: Diagnosis not present

## 2022-07-31 DIAGNOSIS — M79675 Pain in left toe(s): Secondary | ICD-10-CM | POA: Diagnosis not present

## 2022-08-01 DIAGNOSIS — M6281 Muscle weakness (generalized): Secondary | ICD-10-CM | POA: Diagnosis not present

## 2022-08-01 DIAGNOSIS — R2689 Other abnormalities of gait and mobility: Secondary | ICD-10-CM | POA: Diagnosis not present

## 2022-08-01 DIAGNOSIS — Z9181 History of falling: Secondary | ICD-10-CM | POA: Diagnosis not present

## 2022-08-03 DIAGNOSIS — Z9181 History of falling: Secondary | ICD-10-CM | POA: Diagnosis not present

## 2022-08-03 DIAGNOSIS — M6281 Muscle weakness (generalized): Secondary | ICD-10-CM | POA: Diagnosis not present

## 2022-08-03 DIAGNOSIS — R2689 Other abnormalities of gait and mobility: Secondary | ICD-10-CM | POA: Diagnosis not present

## 2022-08-06 DIAGNOSIS — M6281 Muscle weakness (generalized): Secondary | ICD-10-CM | POA: Diagnosis not present

## 2022-08-06 DIAGNOSIS — Z9181 History of falling: Secondary | ICD-10-CM | POA: Diagnosis not present

## 2022-08-06 DIAGNOSIS — R2689 Other abnormalities of gait and mobility: Secondary | ICD-10-CM | POA: Diagnosis not present

## 2022-08-07 DIAGNOSIS — Z9181 History of falling: Secondary | ICD-10-CM | POA: Diagnosis not present

## 2022-08-07 DIAGNOSIS — M6281 Muscle weakness (generalized): Secondary | ICD-10-CM | POA: Diagnosis not present

## 2022-08-07 DIAGNOSIS — R2689 Other abnormalities of gait and mobility: Secondary | ICD-10-CM | POA: Diagnosis not present

## 2022-08-08 DIAGNOSIS — Z9181 History of falling: Secondary | ICD-10-CM | POA: Diagnosis not present

## 2022-08-08 DIAGNOSIS — M6281 Muscle weakness (generalized): Secondary | ICD-10-CM | POA: Diagnosis not present

## 2022-08-08 DIAGNOSIS — R2689 Other abnormalities of gait and mobility: Secondary | ICD-10-CM | POA: Diagnosis not present

## 2022-08-09 DIAGNOSIS — I739 Peripheral vascular disease, unspecified: Secondary | ICD-10-CM | POA: Diagnosis not present

## 2022-08-09 DIAGNOSIS — I509 Heart failure, unspecified: Secondary | ICD-10-CM | POA: Diagnosis not present

## 2022-08-09 DIAGNOSIS — Z9181 History of falling: Secondary | ICD-10-CM | POA: Diagnosis not present

## 2022-08-09 DIAGNOSIS — R2689 Other abnormalities of gait and mobility: Secondary | ICD-10-CM | POA: Diagnosis not present

## 2022-08-09 DIAGNOSIS — G309 Alzheimer's disease, unspecified: Secondary | ICD-10-CM | POA: Diagnosis not present

## 2022-08-09 DIAGNOSIS — S81812A Laceration without foreign body, left lower leg, initial encounter: Secondary | ICD-10-CM | POA: Diagnosis not present

## 2022-08-09 DIAGNOSIS — M6281 Muscle weakness (generalized): Secondary | ICD-10-CM | POA: Diagnosis not present

## 2022-08-10 DIAGNOSIS — M6281 Muscle weakness (generalized): Secondary | ICD-10-CM | POA: Diagnosis not present

## 2022-08-10 DIAGNOSIS — R2681 Unsteadiness on feet: Secondary | ICD-10-CM | POA: Diagnosis not present

## 2022-08-10 DIAGNOSIS — R2689 Other abnormalities of gait and mobility: Secondary | ICD-10-CM | POA: Diagnosis not present

## 2022-08-10 DIAGNOSIS — M1990 Unspecified osteoarthritis, unspecified site: Secondary | ICD-10-CM | POA: Diagnosis not present

## 2022-08-11 DIAGNOSIS — R2681 Unsteadiness on feet: Secondary | ICD-10-CM | POA: Diagnosis not present

## 2022-08-11 DIAGNOSIS — M6281 Muscle weakness (generalized): Secondary | ICD-10-CM | POA: Diagnosis not present

## 2022-08-11 DIAGNOSIS — R2689 Other abnormalities of gait and mobility: Secondary | ICD-10-CM | POA: Diagnosis not present

## 2022-08-11 DIAGNOSIS — M1990 Unspecified osteoarthritis, unspecified site: Secondary | ICD-10-CM | POA: Diagnosis not present

## 2022-08-14 DIAGNOSIS — R2681 Unsteadiness on feet: Secondary | ICD-10-CM | POA: Diagnosis not present

## 2022-08-14 DIAGNOSIS — M1990 Unspecified osteoarthritis, unspecified site: Secondary | ICD-10-CM | POA: Diagnosis not present

## 2022-08-14 DIAGNOSIS — R2689 Other abnormalities of gait and mobility: Secondary | ICD-10-CM | POA: Diagnosis not present

## 2022-08-14 DIAGNOSIS — M6281 Muscle weakness (generalized): Secondary | ICD-10-CM | POA: Diagnosis not present

## 2022-08-15 DIAGNOSIS — M6281 Muscle weakness (generalized): Secondary | ICD-10-CM | POA: Diagnosis not present

## 2022-08-15 DIAGNOSIS — M1990 Unspecified osteoarthritis, unspecified site: Secondary | ICD-10-CM | POA: Diagnosis not present

## 2022-08-15 DIAGNOSIS — R2689 Other abnormalities of gait and mobility: Secondary | ICD-10-CM | POA: Diagnosis not present

## 2022-08-15 DIAGNOSIS — R2681 Unsteadiness on feet: Secondary | ICD-10-CM | POA: Diagnosis not present

## 2022-08-16 DIAGNOSIS — R2681 Unsteadiness on feet: Secondary | ICD-10-CM | POA: Diagnosis not present

## 2022-08-16 DIAGNOSIS — M1990 Unspecified osteoarthritis, unspecified site: Secondary | ICD-10-CM | POA: Diagnosis not present

## 2022-08-16 DIAGNOSIS — M6281 Muscle weakness (generalized): Secondary | ICD-10-CM | POA: Diagnosis not present

## 2022-08-16 DIAGNOSIS — R2689 Other abnormalities of gait and mobility: Secondary | ICD-10-CM | POA: Diagnosis not present

## 2022-08-17 DIAGNOSIS — M1990 Unspecified osteoarthritis, unspecified site: Secondary | ICD-10-CM | POA: Diagnosis not present

## 2022-08-17 DIAGNOSIS — R2689 Other abnormalities of gait and mobility: Secondary | ICD-10-CM | POA: Diagnosis not present

## 2022-08-17 DIAGNOSIS — M6281 Muscle weakness (generalized): Secondary | ICD-10-CM | POA: Diagnosis not present

## 2022-08-17 DIAGNOSIS — R2681 Unsteadiness on feet: Secondary | ICD-10-CM | POA: Diagnosis not present

## 2022-08-20 DIAGNOSIS — M6281 Muscle weakness (generalized): Secondary | ICD-10-CM | POA: Diagnosis not present

## 2022-08-20 DIAGNOSIS — R2689 Other abnormalities of gait and mobility: Secondary | ICD-10-CM | POA: Diagnosis not present

## 2022-08-20 DIAGNOSIS — M1990 Unspecified osteoarthritis, unspecified site: Secondary | ICD-10-CM | POA: Diagnosis not present

## 2022-08-20 DIAGNOSIS — R2681 Unsteadiness on feet: Secondary | ICD-10-CM | POA: Diagnosis not present

## 2022-08-21 DIAGNOSIS — M1990 Unspecified osteoarthritis, unspecified site: Secondary | ICD-10-CM | POA: Diagnosis not present

## 2022-08-21 DIAGNOSIS — R2681 Unsteadiness on feet: Secondary | ICD-10-CM | POA: Diagnosis not present

## 2022-08-21 DIAGNOSIS — R2689 Other abnormalities of gait and mobility: Secondary | ICD-10-CM | POA: Diagnosis not present

## 2022-08-21 DIAGNOSIS — M6281 Muscle weakness (generalized): Secondary | ICD-10-CM | POA: Diagnosis not present

## 2022-08-22 DIAGNOSIS — R2681 Unsteadiness on feet: Secondary | ICD-10-CM | POA: Diagnosis not present

## 2022-08-22 DIAGNOSIS — M1990 Unspecified osteoarthritis, unspecified site: Secondary | ICD-10-CM | POA: Diagnosis not present

## 2022-08-22 DIAGNOSIS — M6281 Muscle weakness (generalized): Secondary | ICD-10-CM | POA: Diagnosis not present

## 2022-08-22 DIAGNOSIS — R2689 Other abnormalities of gait and mobility: Secondary | ICD-10-CM | POA: Diagnosis not present

## 2022-08-23 DIAGNOSIS — S80812D Abrasion, left lower leg, subsequent encounter: Secondary | ICD-10-CM | POA: Diagnosis not present

## 2022-08-23 DIAGNOSIS — R2681 Unsteadiness on feet: Secondary | ICD-10-CM | POA: Diagnosis not present

## 2022-08-23 DIAGNOSIS — R2689 Other abnormalities of gait and mobility: Secondary | ICD-10-CM | POA: Diagnosis not present

## 2022-08-23 DIAGNOSIS — M1990 Unspecified osteoarthritis, unspecified site: Secondary | ICD-10-CM | POA: Diagnosis not present

## 2022-08-23 DIAGNOSIS — I739 Peripheral vascular disease, unspecified: Secondary | ICD-10-CM | POA: Diagnosis not present

## 2022-08-23 DIAGNOSIS — M6281 Muscle weakness (generalized): Secondary | ICD-10-CM | POA: Diagnosis not present

## 2022-08-23 DIAGNOSIS — G309 Alzheimer's disease, unspecified: Secondary | ICD-10-CM | POA: Diagnosis not present

## 2022-08-23 DIAGNOSIS — I509 Heart failure, unspecified: Secondary | ICD-10-CM | POA: Diagnosis not present

## 2022-08-24 DIAGNOSIS — R2689 Other abnormalities of gait and mobility: Secondary | ICD-10-CM | POA: Diagnosis not present

## 2022-08-24 DIAGNOSIS — M6281 Muscle weakness (generalized): Secondary | ICD-10-CM | POA: Diagnosis not present

## 2022-08-24 DIAGNOSIS — R2681 Unsteadiness on feet: Secondary | ICD-10-CM | POA: Diagnosis not present

## 2022-08-24 DIAGNOSIS — M1990 Unspecified osteoarthritis, unspecified site: Secondary | ICD-10-CM | POA: Diagnosis not present

## 2022-08-27 DIAGNOSIS — R2681 Unsteadiness on feet: Secondary | ICD-10-CM | POA: Diagnosis not present

## 2022-08-27 DIAGNOSIS — M1990 Unspecified osteoarthritis, unspecified site: Secondary | ICD-10-CM | POA: Diagnosis not present

## 2022-08-27 DIAGNOSIS — M6281 Muscle weakness (generalized): Secondary | ICD-10-CM | POA: Diagnosis not present

## 2022-08-27 DIAGNOSIS — G301 Alzheimer's disease with late onset: Secondary | ICD-10-CM | POA: Diagnosis not present

## 2022-08-27 DIAGNOSIS — I739 Peripheral vascular disease, unspecified: Secondary | ICD-10-CM | POA: Diagnosis not present

## 2022-08-27 DIAGNOSIS — F0390 Unspecified dementia without behavioral disturbance: Secondary | ICD-10-CM | POA: Diagnosis not present

## 2022-08-27 DIAGNOSIS — R2689 Other abnormalities of gait and mobility: Secondary | ICD-10-CM | POA: Diagnosis not present

## 2022-08-27 DIAGNOSIS — F0280 Dementia in other diseases classified elsewhere without behavioral disturbance: Secondary | ICD-10-CM | POA: Diagnosis not present

## 2022-08-28 DIAGNOSIS — M6281 Muscle weakness (generalized): Secondary | ICD-10-CM | POA: Diagnosis not present

## 2022-08-28 DIAGNOSIS — R2689 Other abnormalities of gait and mobility: Secondary | ICD-10-CM | POA: Diagnosis not present

## 2022-08-28 DIAGNOSIS — M1990 Unspecified osteoarthritis, unspecified site: Secondary | ICD-10-CM | POA: Diagnosis not present

## 2022-08-28 DIAGNOSIS — R2681 Unsteadiness on feet: Secondary | ICD-10-CM | POA: Diagnosis not present

## 2022-08-29 DIAGNOSIS — E785 Hyperlipidemia, unspecified: Secondary | ICD-10-CM | POA: Diagnosis not present

## 2022-08-29 DIAGNOSIS — M6281 Muscle weakness (generalized): Secondary | ICD-10-CM | POA: Diagnosis not present

## 2022-08-29 DIAGNOSIS — R2689 Other abnormalities of gait and mobility: Secondary | ICD-10-CM | POA: Diagnosis not present

## 2022-08-29 DIAGNOSIS — R739 Hyperglycemia, unspecified: Secondary | ICD-10-CM | POA: Diagnosis not present

## 2022-08-29 DIAGNOSIS — R2681 Unsteadiness on feet: Secondary | ICD-10-CM | POA: Diagnosis not present

## 2022-08-29 DIAGNOSIS — M1990 Unspecified osteoarthritis, unspecified site: Secondary | ICD-10-CM | POA: Diagnosis not present

## 2022-08-29 DIAGNOSIS — Z79899 Other long term (current) drug therapy: Secondary | ICD-10-CM | POA: Diagnosis not present

## 2022-08-29 DIAGNOSIS — E559 Vitamin D deficiency, unspecified: Secondary | ICD-10-CM | POA: Diagnosis not present

## 2022-08-29 DIAGNOSIS — I739 Peripheral vascular disease, unspecified: Secondary | ICD-10-CM | POA: Diagnosis not present

## 2022-08-29 DIAGNOSIS — E039 Hypothyroidism, unspecified: Secondary | ICD-10-CM | POA: Diagnosis not present

## 2022-08-30 DIAGNOSIS — R2681 Unsteadiness on feet: Secondary | ICD-10-CM | POA: Diagnosis not present

## 2022-08-30 DIAGNOSIS — R2689 Other abnormalities of gait and mobility: Secondary | ICD-10-CM | POA: Diagnosis not present

## 2022-08-30 DIAGNOSIS — M6281 Muscle weakness (generalized): Secondary | ICD-10-CM | POA: Diagnosis not present

## 2022-08-30 DIAGNOSIS — M1990 Unspecified osteoarthritis, unspecified site: Secondary | ICD-10-CM | POA: Diagnosis not present

## 2022-08-31 DIAGNOSIS — M6281 Muscle weakness (generalized): Secondary | ICD-10-CM | POA: Diagnosis not present

## 2022-08-31 DIAGNOSIS — M1990 Unspecified osteoarthritis, unspecified site: Secondary | ICD-10-CM | POA: Diagnosis not present

## 2022-08-31 DIAGNOSIS — R2689 Other abnormalities of gait and mobility: Secondary | ICD-10-CM | POA: Diagnosis not present

## 2022-08-31 DIAGNOSIS — R2681 Unsteadiness on feet: Secondary | ICD-10-CM | POA: Diagnosis not present

## 2022-09-04 DIAGNOSIS — T148XXA Other injury of unspecified body region, initial encounter: Secondary | ICD-10-CM | POA: Diagnosis not present

## 2022-09-04 DIAGNOSIS — L03116 Cellulitis of left lower limb: Secondary | ICD-10-CM | POA: Diagnosis not present

## 2022-09-04 DIAGNOSIS — S60222A Contusion of left hand, initial encounter: Secondary | ICD-10-CM | POA: Diagnosis not present

## 2022-09-05 DIAGNOSIS — M1990 Unspecified osteoarthritis, unspecified site: Secondary | ICD-10-CM | POA: Diagnosis not present

## 2022-09-05 DIAGNOSIS — R2689 Other abnormalities of gait and mobility: Secondary | ICD-10-CM | POA: Diagnosis not present

## 2022-09-05 DIAGNOSIS — R2681 Unsteadiness on feet: Secondary | ICD-10-CM | POA: Diagnosis not present

## 2022-09-05 DIAGNOSIS — M6281 Muscle weakness (generalized): Secondary | ICD-10-CM | POA: Diagnosis not present

## 2022-09-06 DIAGNOSIS — R2689 Other abnormalities of gait and mobility: Secondary | ICD-10-CM | POA: Diagnosis not present

## 2022-09-06 DIAGNOSIS — R2681 Unsteadiness on feet: Secondary | ICD-10-CM | POA: Diagnosis not present

## 2022-09-06 DIAGNOSIS — M1990 Unspecified osteoarthritis, unspecified site: Secondary | ICD-10-CM | POA: Diagnosis not present

## 2022-09-06 DIAGNOSIS — M6281 Muscle weakness (generalized): Secondary | ICD-10-CM | POA: Diagnosis not present

## 2022-09-27 DIAGNOSIS — G301 Alzheimer's disease with late onset: Secondary | ICD-10-CM | POA: Diagnosis not present

## 2022-09-27 DIAGNOSIS — I739 Peripheral vascular disease, unspecified: Secondary | ICD-10-CM | POA: Diagnosis not present

## 2022-09-27 DIAGNOSIS — F0280 Dementia in other diseases classified elsewhere without behavioral disturbance: Secondary | ICD-10-CM | POA: Diagnosis not present

## 2022-09-27 DIAGNOSIS — F339 Major depressive disorder, recurrent, unspecified: Secondary | ICD-10-CM | POA: Diagnosis not present

## 2022-10-15 DIAGNOSIS — Z9181 History of falling: Secondary | ICD-10-CM | POA: Diagnosis not present

## 2022-10-15 DIAGNOSIS — R2689 Other abnormalities of gait and mobility: Secondary | ICD-10-CM | POA: Diagnosis not present

## 2022-10-15 DIAGNOSIS — M6281 Muscle weakness (generalized): Secondary | ICD-10-CM | POA: Diagnosis not present

## 2022-10-16 DIAGNOSIS — R2689 Other abnormalities of gait and mobility: Secondary | ICD-10-CM | POA: Diagnosis not present

## 2022-10-16 DIAGNOSIS — Z9181 History of falling: Secondary | ICD-10-CM | POA: Diagnosis not present

## 2022-10-16 DIAGNOSIS — M6281 Muscle weakness (generalized): Secondary | ICD-10-CM | POA: Diagnosis not present

## 2022-10-17 DIAGNOSIS — Z9181 History of falling: Secondary | ICD-10-CM | POA: Diagnosis not present

## 2022-10-17 DIAGNOSIS — M6281 Muscle weakness (generalized): Secondary | ICD-10-CM | POA: Diagnosis not present

## 2022-10-17 DIAGNOSIS — R2689 Other abnormalities of gait and mobility: Secondary | ICD-10-CM | POA: Diagnosis not present

## 2022-10-18 DIAGNOSIS — M6281 Muscle weakness (generalized): Secondary | ICD-10-CM | POA: Diagnosis not present

## 2022-10-18 DIAGNOSIS — R2689 Other abnormalities of gait and mobility: Secondary | ICD-10-CM | POA: Diagnosis not present

## 2022-10-18 DIAGNOSIS — Z9181 History of falling: Secondary | ICD-10-CM | POA: Diagnosis not present

## 2022-10-19 DIAGNOSIS — Z9181 History of falling: Secondary | ICD-10-CM | POA: Diagnosis not present

## 2022-10-19 DIAGNOSIS — R2689 Other abnormalities of gait and mobility: Secondary | ICD-10-CM | POA: Diagnosis not present

## 2022-10-19 DIAGNOSIS — M6281 Muscle weakness (generalized): Secondary | ICD-10-CM | POA: Diagnosis not present

## 2022-10-25 DIAGNOSIS — M6281 Muscle weakness (generalized): Secondary | ICD-10-CM | POA: Diagnosis not present

## 2022-10-25 DIAGNOSIS — R2689 Other abnormalities of gait and mobility: Secondary | ICD-10-CM | POA: Diagnosis not present

## 2022-10-25 DIAGNOSIS — Z9181 History of falling: Secondary | ICD-10-CM | POA: Diagnosis not present

## 2022-10-26 DIAGNOSIS — R2689 Other abnormalities of gait and mobility: Secondary | ICD-10-CM | POA: Diagnosis not present

## 2022-10-26 DIAGNOSIS — M6281 Muscle weakness (generalized): Secondary | ICD-10-CM | POA: Diagnosis not present

## 2022-10-26 DIAGNOSIS — Z9181 History of falling: Secondary | ICD-10-CM | POA: Diagnosis not present

## 2022-10-27 DIAGNOSIS — M6281 Muscle weakness (generalized): Secondary | ICD-10-CM | POA: Diagnosis not present

## 2022-10-27 DIAGNOSIS — R2689 Other abnormalities of gait and mobility: Secondary | ICD-10-CM | POA: Diagnosis not present

## 2022-10-27 DIAGNOSIS — Z9181 History of falling: Secondary | ICD-10-CM | POA: Diagnosis not present

## 2022-10-28 DIAGNOSIS — Z9181 History of falling: Secondary | ICD-10-CM | POA: Diagnosis not present

## 2022-10-28 DIAGNOSIS — M6281 Muscle weakness (generalized): Secondary | ICD-10-CM | POA: Diagnosis not present

## 2022-10-28 DIAGNOSIS — R2689 Other abnormalities of gait and mobility: Secondary | ICD-10-CM | POA: Diagnosis not present

## 2022-10-29 DIAGNOSIS — F0280 Dementia in other diseases classified elsewhere without behavioral disturbance: Secondary | ICD-10-CM | POA: Diagnosis not present

## 2022-10-29 DIAGNOSIS — M6281 Muscle weakness (generalized): Secondary | ICD-10-CM | POA: Diagnosis not present

## 2022-10-29 DIAGNOSIS — Z9181 History of falling: Secondary | ICD-10-CM | POA: Diagnosis not present

## 2022-10-29 DIAGNOSIS — G301 Alzheimer's disease with late onset: Secondary | ICD-10-CM | POA: Diagnosis not present

## 2022-10-29 DIAGNOSIS — R2689 Other abnormalities of gait and mobility: Secondary | ICD-10-CM | POA: Diagnosis not present

## 2022-10-29 DIAGNOSIS — I739 Peripheral vascular disease, unspecified: Secondary | ICD-10-CM | POA: Diagnosis not present

## 2022-10-29 DIAGNOSIS — F339 Major depressive disorder, recurrent, unspecified: Secondary | ICD-10-CM | POA: Diagnosis not present

## 2022-11-01 DIAGNOSIS — R2689 Other abnormalities of gait and mobility: Secondary | ICD-10-CM | POA: Diagnosis not present

## 2022-11-01 DIAGNOSIS — M6281 Muscle weakness (generalized): Secondary | ICD-10-CM | POA: Diagnosis not present

## 2022-11-01 DIAGNOSIS — Z9181 History of falling: Secondary | ICD-10-CM | POA: Diagnosis not present

## 2022-11-04 DIAGNOSIS — M6281 Muscle weakness (generalized): Secondary | ICD-10-CM | POA: Diagnosis not present

## 2022-11-04 DIAGNOSIS — R2689 Other abnormalities of gait and mobility: Secondary | ICD-10-CM | POA: Diagnosis not present

## 2022-11-04 DIAGNOSIS — Z9181 History of falling: Secondary | ICD-10-CM | POA: Diagnosis not present

## 2022-11-07 DIAGNOSIS — R2689 Other abnormalities of gait and mobility: Secondary | ICD-10-CM | POA: Diagnosis not present

## 2022-11-07 DIAGNOSIS — Z9181 History of falling: Secondary | ICD-10-CM | POA: Diagnosis not present

## 2022-11-07 DIAGNOSIS — M6281 Muscle weakness (generalized): Secondary | ICD-10-CM | POA: Diagnosis not present

## 2022-11-08 DIAGNOSIS — Z9181 History of falling: Secondary | ICD-10-CM | POA: Diagnosis not present

## 2022-11-08 DIAGNOSIS — R2689 Other abnormalities of gait and mobility: Secondary | ICD-10-CM | POA: Diagnosis not present

## 2022-11-08 DIAGNOSIS — M6281 Muscle weakness (generalized): Secondary | ICD-10-CM | POA: Diagnosis not present

## 2022-11-09 DIAGNOSIS — M6281 Muscle weakness (generalized): Secondary | ICD-10-CM | POA: Diagnosis not present

## 2022-11-09 DIAGNOSIS — G301 Alzheimer's disease with late onset: Secondary | ICD-10-CM | POA: Diagnosis not present

## 2022-11-09 DIAGNOSIS — R2689 Other abnormalities of gait and mobility: Secondary | ICD-10-CM | POA: Diagnosis not present

## 2022-11-09 DIAGNOSIS — Z9181 History of falling: Secondary | ICD-10-CM | POA: Diagnosis not present

## 2022-11-13 DIAGNOSIS — Z9181 History of falling: Secondary | ICD-10-CM | POA: Diagnosis not present

## 2022-11-13 DIAGNOSIS — G301 Alzheimer's disease with late onset: Secondary | ICD-10-CM | POA: Diagnosis not present

## 2022-11-13 DIAGNOSIS — M6281 Muscle weakness (generalized): Secondary | ICD-10-CM | POA: Diagnosis not present

## 2022-11-13 DIAGNOSIS — R2689 Other abnormalities of gait and mobility: Secondary | ICD-10-CM | POA: Diagnosis not present

## 2022-11-14 DIAGNOSIS — G301 Alzheimer's disease with late onset: Secondary | ICD-10-CM | POA: Diagnosis not present

## 2022-11-14 DIAGNOSIS — Z9181 History of falling: Secondary | ICD-10-CM | POA: Diagnosis not present

## 2022-11-14 DIAGNOSIS — M6281 Muscle weakness (generalized): Secondary | ICD-10-CM | POA: Diagnosis not present

## 2022-11-14 DIAGNOSIS — R2689 Other abnormalities of gait and mobility: Secondary | ICD-10-CM | POA: Diagnosis not present

## 2022-11-15 DIAGNOSIS — M6281 Muscle weakness (generalized): Secondary | ICD-10-CM | POA: Diagnosis not present

## 2022-11-15 DIAGNOSIS — G301 Alzheimer's disease with late onset: Secondary | ICD-10-CM | POA: Diagnosis not present

## 2022-11-15 DIAGNOSIS — R2689 Other abnormalities of gait and mobility: Secondary | ICD-10-CM | POA: Diagnosis not present

## 2022-11-15 DIAGNOSIS — Z9181 History of falling: Secondary | ICD-10-CM | POA: Diagnosis not present

## 2022-11-17 DIAGNOSIS — Z9181 History of falling: Secondary | ICD-10-CM | POA: Diagnosis not present

## 2022-11-17 DIAGNOSIS — R2689 Other abnormalities of gait and mobility: Secondary | ICD-10-CM | POA: Diagnosis not present

## 2022-11-17 DIAGNOSIS — M6281 Muscle weakness (generalized): Secondary | ICD-10-CM | POA: Diagnosis not present

## 2022-11-17 DIAGNOSIS — G301 Alzheimer's disease with late onset: Secondary | ICD-10-CM | POA: Diagnosis not present

## 2022-11-18 DIAGNOSIS — Z9181 History of falling: Secondary | ICD-10-CM | POA: Diagnosis not present

## 2022-11-18 DIAGNOSIS — M6281 Muscle weakness (generalized): Secondary | ICD-10-CM | POA: Diagnosis not present

## 2022-11-18 DIAGNOSIS — G301 Alzheimer's disease with late onset: Secondary | ICD-10-CM | POA: Diagnosis not present

## 2022-11-18 DIAGNOSIS — R2689 Other abnormalities of gait and mobility: Secondary | ICD-10-CM | POA: Diagnosis not present

## 2022-11-20 DIAGNOSIS — R2689 Other abnormalities of gait and mobility: Secondary | ICD-10-CM | POA: Diagnosis not present

## 2022-11-20 DIAGNOSIS — Z9181 History of falling: Secondary | ICD-10-CM | POA: Diagnosis not present

## 2022-11-20 DIAGNOSIS — M6281 Muscle weakness (generalized): Secondary | ICD-10-CM | POA: Diagnosis not present

## 2022-11-20 DIAGNOSIS — G301 Alzheimer's disease with late onset: Secondary | ICD-10-CM | POA: Diagnosis not present

## 2022-11-22 DIAGNOSIS — M6281 Muscle weakness (generalized): Secondary | ICD-10-CM | POA: Diagnosis not present

## 2022-11-22 DIAGNOSIS — R2689 Other abnormalities of gait and mobility: Secondary | ICD-10-CM | POA: Diagnosis not present

## 2022-11-22 DIAGNOSIS — Z9181 History of falling: Secondary | ICD-10-CM | POA: Diagnosis not present

## 2022-11-22 DIAGNOSIS — G301 Alzheimer's disease with late onset: Secondary | ICD-10-CM | POA: Diagnosis not present

## 2022-11-23 DIAGNOSIS — R2689 Other abnormalities of gait and mobility: Secondary | ICD-10-CM | POA: Diagnosis not present

## 2022-11-23 DIAGNOSIS — Z9181 History of falling: Secondary | ICD-10-CM | POA: Diagnosis not present

## 2022-11-23 DIAGNOSIS — G301 Alzheimer's disease with late onset: Secondary | ICD-10-CM | POA: Diagnosis not present

## 2022-11-23 DIAGNOSIS — M6281 Muscle weakness (generalized): Secondary | ICD-10-CM | POA: Diagnosis not present

## 2022-11-25 DIAGNOSIS — G301 Alzheimer's disease with late onset: Secondary | ICD-10-CM | POA: Diagnosis not present

## 2022-11-25 DIAGNOSIS — M6281 Muscle weakness (generalized): Secondary | ICD-10-CM | POA: Diagnosis not present

## 2022-11-25 DIAGNOSIS — R2689 Other abnormalities of gait and mobility: Secondary | ICD-10-CM | POA: Diagnosis not present

## 2022-11-25 DIAGNOSIS — Z9181 History of falling: Secondary | ICD-10-CM | POA: Diagnosis not present

## 2022-11-26 DIAGNOSIS — M6281 Muscle weakness (generalized): Secondary | ICD-10-CM | POA: Diagnosis not present

## 2022-11-26 DIAGNOSIS — R2689 Other abnormalities of gait and mobility: Secondary | ICD-10-CM | POA: Diagnosis not present

## 2022-11-26 DIAGNOSIS — G301 Alzheimer's disease with late onset: Secondary | ICD-10-CM | POA: Diagnosis not present

## 2022-11-26 DIAGNOSIS — Z9181 History of falling: Secondary | ICD-10-CM | POA: Diagnosis not present

## 2022-11-27 DIAGNOSIS — G301 Alzheimer's disease with late onset: Secondary | ICD-10-CM | POA: Diagnosis not present

## 2022-11-27 DIAGNOSIS — M6281 Muscle weakness (generalized): Secondary | ICD-10-CM | POA: Diagnosis not present

## 2022-11-27 DIAGNOSIS — R2689 Other abnormalities of gait and mobility: Secondary | ICD-10-CM | POA: Diagnosis not present

## 2022-11-27 DIAGNOSIS — Z9181 History of falling: Secondary | ICD-10-CM | POA: Diagnosis not present

## 2022-11-28 DIAGNOSIS — M6281 Muscle weakness (generalized): Secondary | ICD-10-CM | POA: Diagnosis not present

## 2022-11-28 DIAGNOSIS — G301 Alzheimer's disease with late onset: Secondary | ICD-10-CM | POA: Diagnosis not present

## 2022-11-28 DIAGNOSIS — R2689 Other abnormalities of gait and mobility: Secondary | ICD-10-CM | POA: Diagnosis not present

## 2022-11-28 DIAGNOSIS — Z9181 History of falling: Secondary | ICD-10-CM | POA: Diagnosis not present

## 2022-11-29 DIAGNOSIS — R2689 Other abnormalities of gait and mobility: Secondary | ICD-10-CM | POA: Diagnosis not present

## 2022-11-29 DIAGNOSIS — M6281 Muscle weakness (generalized): Secondary | ICD-10-CM | POA: Diagnosis not present

## 2022-11-29 DIAGNOSIS — Z9181 History of falling: Secondary | ICD-10-CM | POA: Diagnosis not present

## 2022-11-29 DIAGNOSIS — G301 Alzheimer's disease with late onset: Secondary | ICD-10-CM | POA: Diagnosis not present

## 2022-12-03 DIAGNOSIS — M6281 Muscle weakness (generalized): Secondary | ICD-10-CM | POA: Diagnosis not present

## 2022-12-03 DIAGNOSIS — Z9181 History of falling: Secondary | ICD-10-CM | POA: Diagnosis not present

## 2022-12-03 DIAGNOSIS — R2689 Other abnormalities of gait and mobility: Secondary | ICD-10-CM | POA: Diagnosis not present

## 2022-12-03 DIAGNOSIS — G301 Alzheimer's disease with late onset: Secondary | ICD-10-CM | POA: Diagnosis not present

## 2022-12-04 DIAGNOSIS — R2689 Other abnormalities of gait and mobility: Secondary | ICD-10-CM | POA: Diagnosis not present

## 2022-12-04 DIAGNOSIS — Z9181 History of falling: Secondary | ICD-10-CM | POA: Diagnosis not present

## 2022-12-04 DIAGNOSIS — G301 Alzheimer's disease with late onset: Secondary | ICD-10-CM | POA: Diagnosis not present

## 2022-12-04 DIAGNOSIS — M6281 Muscle weakness (generalized): Secondary | ICD-10-CM | POA: Diagnosis not present

## 2022-12-07 DIAGNOSIS — M6281 Muscle weakness (generalized): Secondary | ICD-10-CM | POA: Diagnosis not present

## 2022-12-07 DIAGNOSIS — G301 Alzheimer's disease with late onset: Secondary | ICD-10-CM | POA: Diagnosis not present

## 2022-12-07 DIAGNOSIS — Z9181 History of falling: Secondary | ICD-10-CM | POA: Diagnosis not present

## 2022-12-07 DIAGNOSIS — R2689 Other abnormalities of gait and mobility: Secondary | ICD-10-CM | POA: Diagnosis not present

## 2022-12-09 DIAGNOSIS — G301 Alzheimer's disease with late onset: Secondary | ICD-10-CM | POA: Diagnosis not present

## 2022-12-09 DIAGNOSIS — M6281 Muscle weakness (generalized): Secondary | ICD-10-CM | POA: Diagnosis not present

## 2022-12-09 DIAGNOSIS — Z9181 History of falling: Secondary | ICD-10-CM | POA: Diagnosis not present

## 2022-12-09 DIAGNOSIS — R2689 Other abnormalities of gait and mobility: Secondary | ICD-10-CM | POA: Diagnosis not present

## 2022-12-10 DIAGNOSIS — G301 Alzheimer's disease with late onset: Secondary | ICD-10-CM | POA: Diagnosis not present

## 2022-12-10 DIAGNOSIS — R2689 Other abnormalities of gait and mobility: Secondary | ICD-10-CM | POA: Diagnosis not present

## 2022-12-10 DIAGNOSIS — Z9181 History of falling: Secondary | ICD-10-CM | POA: Diagnosis not present

## 2022-12-10 DIAGNOSIS — M6281 Muscle weakness (generalized): Secondary | ICD-10-CM | POA: Diagnosis not present

## 2022-12-11 DIAGNOSIS — M6281 Muscle weakness (generalized): Secondary | ICD-10-CM | POA: Diagnosis not present

## 2022-12-11 DIAGNOSIS — Z9181 History of falling: Secondary | ICD-10-CM | POA: Diagnosis not present

## 2022-12-11 DIAGNOSIS — G301 Alzheimer's disease with late onset: Secondary | ICD-10-CM | POA: Diagnosis not present

## 2022-12-11 DIAGNOSIS — R2689 Other abnormalities of gait and mobility: Secondary | ICD-10-CM | POA: Diagnosis not present

## 2022-12-13 DIAGNOSIS — R2689 Other abnormalities of gait and mobility: Secondary | ICD-10-CM | POA: Diagnosis not present

## 2022-12-13 DIAGNOSIS — G301 Alzheimer's disease with late onset: Secondary | ICD-10-CM | POA: Diagnosis not present

## 2022-12-13 DIAGNOSIS — Z9181 History of falling: Secondary | ICD-10-CM | POA: Diagnosis not present

## 2022-12-13 DIAGNOSIS — M6281 Muscle weakness (generalized): Secondary | ICD-10-CM | POA: Diagnosis not present

## 2022-12-14 DIAGNOSIS — Z9181 History of falling: Secondary | ICD-10-CM | POA: Diagnosis not present

## 2022-12-14 DIAGNOSIS — R2689 Other abnormalities of gait and mobility: Secondary | ICD-10-CM | POA: Diagnosis not present

## 2022-12-14 DIAGNOSIS — G301 Alzheimer's disease with late onset: Secondary | ICD-10-CM | POA: Diagnosis not present

## 2022-12-14 DIAGNOSIS — M6281 Muscle weakness (generalized): Secondary | ICD-10-CM | POA: Diagnosis not present

## 2022-12-18 DIAGNOSIS — M6281 Muscle weakness (generalized): Secondary | ICD-10-CM | POA: Diagnosis not present

## 2022-12-18 DIAGNOSIS — Z9181 History of falling: Secondary | ICD-10-CM | POA: Diagnosis not present

## 2022-12-18 DIAGNOSIS — G301 Alzheimer's disease with late onset: Secondary | ICD-10-CM | POA: Diagnosis not present

## 2022-12-18 DIAGNOSIS — R2689 Other abnormalities of gait and mobility: Secondary | ICD-10-CM | POA: Diagnosis not present

## 2022-12-27 DIAGNOSIS — I10 Essential (primary) hypertension: Secondary | ICD-10-CM | POA: Diagnosis not present

## 2022-12-27 DIAGNOSIS — E785 Hyperlipidemia, unspecified: Secondary | ICD-10-CM | POA: Diagnosis not present

## 2022-12-27 DIAGNOSIS — G309 Alzheimer's disease, unspecified: Secondary | ICD-10-CM | POA: Diagnosis not present

## 2022-12-27 DIAGNOSIS — I739 Peripheral vascular disease, unspecified: Secondary | ICD-10-CM | POA: Diagnosis not present

## 2022-12-27 DIAGNOSIS — E039 Hypothyroidism, unspecified: Secondary | ICD-10-CM | POA: Diagnosis not present

## 2023-01-10 DIAGNOSIS — M79674 Pain in right toe(s): Secondary | ICD-10-CM | POA: Diagnosis not present

## 2023-01-10 DIAGNOSIS — B351 Tinea unguium: Secondary | ICD-10-CM | POA: Diagnosis not present

## 2023-01-10 DIAGNOSIS — M79675 Pain in left toe(s): Secondary | ICD-10-CM | POA: Diagnosis not present

## 2023-01-14 DIAGNOSIS — E039 Hypothyroidism, unspecified: Secondary | ICD-10-CM | POA: Diagnosis not present

## 2023-01-14 DIAGNOSIS — I739 Peripheral vascular disease, unspecified: Secondary | ICD-10-CM | POA: Diagnosis not present

## 2023-01-14 DIAGNOSIS — G301 Alzheimer's disease with late onset: Secondary | ICD-10-CM | POA: Diagnosis not present

## 2023-01-14 DIAGNOSIS — R278 Other lack of coordination: Secondary | ICD-10-CM | POA: Diagnosis not present

## 2023-01-14 DIAGNOSIS — M6281 Muscle weakness (generalized): Secondary | ICD-10-CM | POA: Diagnosis not present

## 2023-01-14 DIAGNOSIS — G309 Alzheimer's disease, unspecified: Secondary | ICD-10-CM | POA: Diagnosis not present

## 2023-01-14 DIAGNOSIS — M6259 Muscle wasting and atrophy, not elsewhere classified, multiple sites: Secondary | ICD-10-CM | POA: Diagnosis not present

## 2023-01-15 DIAGNOSIS — M6259 Muscle wasting and atrophy, not elsewhere classified, multiple sites: Secondary | ICD-10-CM | POA: Diagnosis not present

## 2023-01-15 DIAGNOSIS — R278 Other lack of coordination: Secondary | ICD-10-CM | POA: Diagnosis not present

## 2023-01-15 DIAGNOSIS — G301 Alzheimer's disease with late onset: Secondary | ICD-10-CM | POA: Diagnosis not present

## 2023-01-15 DIAGNOSIS — M6281 Muscle weakness (generalized): Secondary | ICD-10-CM | POA: Diagnosis not present

## 2023-01-16 DIAGNOSIS — G301 Alzheimer's disease with late onset: Secondary | ICD-10-CM | POA: Diagnosis not present

## 2023-01-16 DIAGNOSIS — R278 Other lack of coordination: Secondary | ICD-10-CM | POA: Diagnosis not present

## 2023-01-16 DIAGNOSIS — M6259 Muscle wasting and atrophy, not elsewhere classified, multiple sites: Secondary | ICD-10-CM | POA: Diagnosis not present

## 2023-01-16 DIAGNOSIS — M6281 Muscle weakness (generalized): Secondary | ICD-10-CM | POA: Diagnosis not present

## 2023-01-17 DIAGNOSIS — R278 Other lack of coordination: Secondary | ICD-10-CM | POA: Diagnosis not present

## 2023-01-17 DIAGNOSIS — G301 Alzheimer's disease with late onset: Secondary | ICD-10-CM | POA: Diagnosis not present

## 2023-01-17 DIAGNOSIS — M6281 Muscle weakness (generalized): Secondary | ICD-10-CM | POA: Diagnosis not present

## 2023-01-17 DIAGNOSIS — M6259 Muscle wasting and atrophy, not elsewhere classified, multiple sites: Secondary | ICD-10-CM | POA: Diagnosis not present

## 2023-01-18 DIAGNOSIS — G301 Alzheimer's disease with late onset: Secondary | ICD-10-CM | POA: Diagnosis not present

## 2023-01-18 DIAGNOSIS — R278 Other lack of coordination: Secondary | ICD-10-CM | POA: Diagnosis not present

## 2023-01-18 DIAGNOSIS — M6259 Muscle wasting and atrophy, not elsewhere classified, multiple sites: Secondary | ICD-10-CM | POA: Diagnosis not present

## 2023-01-18 DIAGNOSIS — M6281 Muscle weakness (generalized): Secondary | ICD-10-CM | POA: Diagnosis not present

## 2023-01-21 DIAGNOSIS — M6259 Muscle wasting and atrophy, not elsewhere classified, multiple sites: Secondary | ICD-10-CM | POA: Diagnosis not present

## 2023-01-21 DIAGNOSIS — R278 Other lack of coordination: Secondary | ICD-10-CM | POA: Diagnosis not present

## 2023-01-21 DIAGNOSIS — M6281 Muscle weakness (generalized): Secondary | ICD-10-CM | POA: Diagnosis not present

## 2023-01-21 DIAGNOSIS — G301 Alzheimer's disease with late onset: Secondary | ICD-10-CM | POA: Diagnosis not present

## 2023-01-23 DIAGNOSIS — M6259 Muscle wasting and atrophy, not elsewhere classified, multiple sites: Secondary | ICD-10-CM | POA: Diagnosis not present

## 2023-01-23 DIAGNOSIS — R278 Other lack of coordination: Secondary | ICD-10-CM | POA: Diagnosis not present

## 2023-01-23 DIAGNOSIS — G301 Alzheimer's disease with late onset: Secondary | ICD-10-CM | POA: Diagnosis not present

## 2023-01-23 DIAGNOSIS — M6281 Muscle weakness (generalized): Secondary | ICD-10-CM | POA: Diagnosis not present

## 2023-01-24 DIAGNOSIS — M6259 Muscle wasting and atrophy, not elsewhere classified, multiple sites: Secondary | ICD-10-CM | POA: Diagnosis not present

## 2023-01-24 DIAGNOSIS — R278 Other lack of coordination: Secondary | ICD-10-CM | POA: Diagnosis not present

## 2023-01-24 DIAGNOSIS — G301 Alzheimer's disease with late onset: Secondary | ICD-10-CM | POA: Diagnosis not present

## 2023-01-24 DIAGNOSIS — M6281 Muscle weakness (generalized): Secondary | ICD-10-CM | POA: Diagnosis not present

## 2023-01-25 DIAGNOSIS — R278 Other lack of coordination: Secondary | ICD-10-CM | POA: Diagnosis not present

## 2023-01-25 DIAGNOSIS — M6259 Muscle wasting and atrophy, not elsewhere classified, multiple sites: Secondary | ICD-10-CM | POA: Diagnosis not present

## 2023-01-25 DIAGNOSIS — M6281 Muscle weakness (generalized): Secondary | ICD-10-CM | POA: Diagnosis not present

## 2023-01-25 DIAGNOSIS — G301 Alzheimer's disease with late onset: Secondary | ICD-10-CM | POA: Diagnosis not present

## 2023-01-28 DIAGNOSIS — R278 Other lack of coordination: Secondary | ICD-10-CM | POA: Diagnosis not present

## 2023-01-28 DIAGNOSIS — M6281 Muscle weakness (generalized): Secondary | ICD-10-CM | POA: Diagnosis not present

## 2023-01-28 DIAGNOSIS — M6259 Muscle wasting and atrophy, not elsewhere classified, multiple sites: Secondary | ICD-10-CM | POA: Diagnosis not present

## 2023-01-28 DIAGNOSIS — G301 Alzheimer's disease with late onset: Secondary | ICD-10-CM | POA: Diagnosis not present

## 2023-01-29 DIAGNOSIS — G301 Alzheimer's disease with late onset: Secondary | ICD-10-CM | POA: Diagnosis not present

## 2023-01-29 DIAGNOSIS — R278 Other lack of coordination: Secondary | ICD-10-CM | POA: Diagnosis not present

## 2023-01-29 DIAGNOSIS — M6259 Muscle wasting and atrophy, not elsewhere classified, multiple sites: Secondary | ICD-10-CM | POA: Diagnosis not present

## 2023-01-29 DIAGNOSIS — M6281 Muscle weakness (generalized): Secondary | ICD-10-CM | POA: Diagnosis not present

## 2023-01-30 DIAGNOSIS — M6259 Muscle wasting and atrophy, not elsewhere classified, multiple sites: Secondary | ICD-10-CM | POA: Diagnosis not present

## 2023-01-30 DIAGNOSIS — R278 Other lack of coordination: Secondary | ICD-10-CM | POA: Diagnosis not present

## 2023-01-30 DIAGNOSIS — G301 Alzheimer's disease with late onset: Secondary | ICD-10-CM | POA: Diagnosis not present

## 2023-01-30 DIAGNOSIS — M6281 Muscle weakness (generalized): Secondary | ICD-10-CM | POA: Diagnosis not present

## 2023-01-31 DIAGNOSIS — R278 Other lack of coordination: Secondary | ICD-10-CM | POA: Diagnosis not present

## 2023-01-31 DIAGNOSIS — M6259 Muscle wasting and atrophy, not elsewhere classified, multiple sites: Secondary | ICD-10-CM | POA: Diagnosis not present

## 2023-01-31 DIAGNOSIS — M6281 Muscle weakness (generalized): Secondary | ICD-10-CM | POA: Diagnosis not present

## 2023-01-31 DIAGNOSIS — G301 Alzheimer's disease with late onset: Secondary | ICD-10-CM | POA: Diagnosis not present

## 2023-02-03 DIAGNOSIS — M6281 Muscle weakness (generalized): Secondary | ICD-10-CM | POA: Diagnosis not present

## 2023-02-03 DIAGNOSIS — R278 Other lack of coordination: Secondary | ICD-10-CM | POA: Diagnosis not present

## 2023-02-03 DIAGNOSIS — G301 Alzheimer's disease with late onset: Secondary | ICD-10-CM | POA: Diagnosis not present

## 2023-02-03 DIAGNOSIS — M6259 Muscle wasting and atrophy, not elsewhere classified, multiple sites: Secondary | ICD-10-CM | POA: Diagnosis not present

## 2023-02-04 DIAGNOSIS — M6281 Muscle weakness (generalized): Secondary | ICD-10-CM | POA: Diagnosis not present

## 2023-02-04 DIAGNOSIS — M6259 Muscle wasting and atrophy, not elsewhere classified, multiple sites: Secondary | ICD-10-CM | POA: Diagnosis not present

## 2023-02-04 DIAGNOSIS — G301 Alzheimer's disease with late onset: Secondary | ICD-10-CM | POA: Diagnosis not present

## 2023-02-04 DIAGNOSIS — R278 Other lack of coordination: Secondary | ICD-10-CM | POA: Diagnosis not present

## 2023-02-05 DIAGNOSIS — M6281 Muscle weakness (generalized): Secondary | ICD-10-CM | POA: Diagnosis not present

## 2023-02-05 DIAGNOSIS — G301 Alzheimer's disease with late onset: Secondary | ICD-10-CM | POA: Diagnosis not present

## 2023-02-05 DIAGNOSIS — R278 Other lack of coordination: Secondary | ICD-10-CM | POA: Diagnosis not present

## 2023-02-05 DIAGNOSIS — M6259 Muscle wasting and atrophy, not elsewhere classified, multiple sites: Secondary | ICD-10-CM | POA: Diagnosis not present

## 2023-02-06 DIAGNOSIS — R278 Other lack of coordination: Secondary | ICD-10-CM | POA: Diagnosis not present

## 2023-02-06 DIAGNOSIS — M6259 Muscle wasting and atrophy, not elsewhere classified, multiple sites: Secondary | ICD-10-CM | POA: Diagnosis not present

## 2023-02-06 DIAGNOSIS — G301 Alzheimer's disease with late onset: Secondary | ICD-10-CM | POA: Diagnosis not present

## 2023-02-06 DIAGNOSIS — M6281 Muscle weakness (generalized): Secondary | ICD-10-CM | POA: Diagnosis not present

## 2023-02-14 DIAGNOSIS — R2681 Unsteadiness on feet: Secondary | ICD-10-CM | POA: Diagnosis not present

## 2023-02-14 DIAGNOSIS — R278 Other lack of coordination: Secondary | ICD-10-CM | POA: Diagnosis not present

## 2023-02-14 DIAGNOSIS — M6259 Muscle wasting and atrophy, not elsewhere classified, multiple sites: Secondary | ICD-10-CM | POA: Diagnosis not present

## 2023-02-14 DIAGNOSIS — G309 Alzheimer's disease, unspecified: Secondary | ICD-10-CM | POA: Diagnosis not present

## 2023-02-14 DIAGNOSIS — I10 Essential (primary) hypertension: Secondary | ICD-10-CM | POA: Diagnosis not present

## 2023-02-14 DIAGNOSIS — E039 Hypothyroidism, unspecified: Secondary | ICD-10-CM | POA: Diagnosis not present

## 2023-02-14 DIAGNOSIS — E785 Hyperlipidemia, unspecified: Secondary | ICD-10-CM | POA: Diagnosis not present

## 2023-02-18 DIAGNOSIS — R278 Other lack of coordination: Secondary | ICD-10-CM | POA: Diagnosis not present

## 2023-02-18 DIAGNOSIS — M6259 Muscle wasting and atrophy, not elsewhere classified, multiple sites: Secondary | ICD-10-CM | POA: Diagnosis not present

## 2023-02-18 DIAGNOSIS — R2681 Unsteadiness on feet: Secondary | ICD-10-CM | POA: Diagnosis not present

## 2023-02-19 DIAGNOSIS — R2681 Unsteadiness on feet: Secondary | ICD-10-CM | POA: Diagnosis not present

## 2023-02-19 DIAGNOSIS — R278 Other lack of coordination: Secondary | ICD-10-CM | POA: Diagnosis not present

## 2023-02-19 DIAGNOSIS — M6259 Muscle wasting and atrophy, not elsewhere classified, multiple sites: Secondary | ICD-10-CM | POA: Diagnosis not present

## 2023-02-20 DIAGNOSIS — Z79899 Other long term (current) drug therapy: Secondary | ICD-10-CM | POA: Diagnosis not present

## 2023-02-20 DIAGNOSIS — R278 Other lack of coordination: Secondary | ICD-10-CM | POA: Diagnosis not present

## 2023-02-20 DIAGNOSIS — E785 Hyperlipidemia, unspecified: Secondary | ICD-10-CM | POA: Diagnosis not present

## 2023-02-20 DIAGNOSIS — E559 Vitamin D deficiency, unspecified: Secondary | ICD-10-CM | POA: Diagnosis not present

## 2023-02-20 DIAGNOSIS — M6259 Muscle wasting and atrophy, not elsewhere classified, multiple sites: Secondary | ICD-10-CM | POA: Diagnosis not present

## 2023-02-20 DIAGNOSIS — E039 Hypothyroidism, unspecified: Secondary | ICD-10-CM | POA: Diagnosis not present

## 2023-02-20 DIAGNOSIS — R2681 Unsteadiness on feet: Secondary | ICD-10-CM | POA: Diagnosis not present

## 2023-02-26 DIAGNOSIS — M6259 Muscle wasting and atrophy, not elsewhere classified, multiple sites: Secondary | ICD-10-CM | POA: Diagnosis not present

## 2023-02-26 DIAGNOSIS — R278 Other lack of coordination: Secondary | ICD-10-CM | POA: Diagnosis not present

## 2023-02-26 DIAGNOSIS — R2681 Unsteadiness on feet: Secondary | ICD-10-CM | POA: Diagnosis not present

## 2023-02-27 DIAGNOSIS — M6259 Muscle wasting and atrophy, not elsewhere classified, multiple sites: Secondary | ICD-10-CM | POA: Diagnosis not present

## 2023-02-27 DIAGNOSIS — R2681 Unsteadiness on feet: Secondary | ICD-10-CM | POA: Diagnosis not present

## 2023-02-27 DIAGNOSIS — R278 Other lack of coordination: Secondary | ICD-10-CM | POA: Diagnosis not present

## 2023-02-28 DIAGNOSIS — R278 Other lack of coordination: Secondary | ICD-10-CM | POA: Diagnosis not present

## 2023-02-28 DIAGNOSIS — R2681 Unsteadiness on feet: Secondary | ICD-10-CM | POA: Diagnosis not present

## 2023-02-28 DIAGNOSIS — M6259 Muscle wasting and atrophy, not elsewhere classified, multiple sites: Secondary | ICD-10-CM | POA: Diagnosis not present

## 2023-03-01 DIAGNOSIS — R278 Other lack of coordination: Secondary | ICD-10-CM | POA: Diagnosis not present

## 2023-03-01 DIAGNOSIS — M6259 Muscle wasting and atrophy, not elsewhere classified, multiple sites: Secondary | ICD-10-CM | POA: Diagnosis not present

## 2023-03-01 DIAGNOSIS — R2681 Unsteadiness on feet: Secondary | ICD-10-CM | POA: Diagnosis not present

## 2023-03-04 DIAGNOSIS — M6259 Muscle wasting and atrophy, not elsewhere classified, multiple sites: Secondary | ICD-10-CM | POA: Diagnosis not present

## 2023-03-04 DIAGNOSIS — R278 Other lack of coordination: Secondary | ICD-10-CM | POA: Diagnosis not present

## 2023-03-04 DIAGNOSIS — R2681 Unsteadiness on feet: Secondary | ICD-10-CM | POA: Diagnosis not present

## 2023-03-05 DIAGNOSIS — M6259 Muscle wasting and atrophy, not elsewhere classified, multiple sites: Secondary | ICD-10-CM | POA: Diagnosis not present

## 2023-03-05 DIAGNOSIS — R278 Other lack of coordination: Secondary | ICD-10-CM | POA: Diagnosis not present

## 2023-03-05 DIAGNOSIS — R2681 Unsteadiness on feet: Secondary | ICD-10-CM | POA: Diagnosis not present

## 2023-03-06 DIAGNOSIS — R278 Other lack of coordination: Secondary | ICD-10-CM | POA: Diagnosis not present

## 2023-03-06 DIAGNOSIS — M6259 Muscle wasting and atrophy, not elsewhere classified, multiple sites: Secondary | ICD-10-CM | POA: Diagnosis not present

## 2023-03-06 DIAGNOSIS — R2681 Unsteadiness on feet: Secondary | ICD-10-CM | POA: Diagnosis not present

## 2023-03-07 DIAGNOSIS — M6259 Muscle wasting and atrophy, not elsewhere classified, multiple sites: Secondary | ICD-10-CM | POA: Diagnosis not present

## 2023-03-07 DIAGNOSIS — R278 Other lack of coordination: Secondary | ICD-10-CM | POA: Diagnosis not present

## 2023-03-07 DIAGNOSIS — R2681 Unsteadiness on feet: Secondary | ICD-10-CM | POA: Diagnosis not present

## 2023-03-08 DIAGNOSIS — R2681 Unsteadiness on feet: Secondary | ICD-10-CM | POA: Diagnosis not present

## 2023-03-08 DIAGNOSIS — M6259 Muscle wasting and atrophy, not elsewhere classified, multiple sites: Secondary | ICD-10-CM | POA: Diagnosis not present

## 2023-03-08 DIAGNOSIS — R278 Other lack of coordination: Secondary | ICD-10-CM | POA: Diagnosis not present

## 2023-03-14 DIAGNOSIS — M6259 Muscle wasting and atrophy, not elsewhere classified, multiple sites: Secondary | ICD-10-CM | POA: Diagnosis not present

## 2023-03-14 DIAGNOSIS — R2681 Unsteadiness on feet: Secondary | ICD-10-CM | POA: Diagnosis not present

## 2023-03-14 DIAGNOSIS — R278 Other lack of coordination: Secondary | ICD-10-CM | POA: Diagnosis not present

## 2023-03-15 DIAGNOSIS — R2681 Unsteadiness on feet: Secondary | ICD-10-CM | POA: Diagnosis not present

## 2023-03-15 DIAGNOSIS — M6259 Muscle wasting and atrophy, not elsewhere classified, multiple sites: Secondary | ICD-10-CM | POA: Diagnosis not present

## 2023-03-15 DIAGNOSIS — R278 Other lack of coordination: Secondary | ICD-10-CM | POA: Diagnosis not present

## 2023-03-18 DIAGNOSIS — R2681 Unsteadiness on feet: Secondary | ICD-10-CM | POA: Diagnosis not present

## 2023-03-18 DIAGNOSIS — M6259 Muscle wasting and atrophy, not elsewhere classified, multiple sites: Secondary | ICD-10-CM | POA: Diagnosis not present

## 2023-03-18 DIAGNOSIS — R278 Other lack of coordination: Secondary | ICD-10-CM | POA: Diagnosis not present

## 2023-03-19 DIAGNOSIS — E785 Hyperlipidemia, unspecified: Secondary | ICD-10-CM | POA: Diagnosis not present

## 2023-03-19 DIAGNOSIS — I739 Peripheral vascular disease, unspecified: Secondary | ICD-10-CM | POA: Diagnosis not present

## 2023-03-19 DIAGNOSIS — E039 Hypothyroidism, unspecified: Secondary | ICD-10-CM | POA: Diagnosis not present

## 2023-04-16 DIAGNOSIS — R41841 Cognitive communication deficit: Secondary | ICD-10-CM | POA: Diagnosis not present

## 2023-04-16 DIAGNOSIS — R2689 Other abnormalities of gait and mobility: Secondary | ICD-10-CM | POA: Diagnosis not present

## 2023-04-16 DIAGNOSIS — M6259 Muscle wasting and atrophy, not elsewhere classified, multiple sites: Secondary | ICD-10-CM | POA: Diagnosis not present

## 2023-04-16 DIAGNOSIS — K219 Gastro-esophageal reflux disease without esophagitis: Secondary | ICD-10-CM | POA: Diagnosis not present

## 2023-04-18 DIAGNOSIS — M6259 Muscle wasting and atrophy, not elsewhere classified, multiple sites: Secondary | ICD-10-CM | POA: Diagnosis not present

## 2023-04-18 DIAGNOSIS — K219 Gastro-esophageal reflux disease without esophagitis: Secondary | ICD-10-CM | POA: Diagnosis not present

## 2023-04-18 DIAGNOSIS — R2689 Other abnormalities of gait and mobility: Secondary | ICD-10-CM | POA: Diagnosis not present

## 2023-04-18 DIAGNOSIS — R41841 Cognitive communication deficit: Secondary | ICD-10-CM | POA: Diagnosis not present

## 2023-04-19 DIAGNOSIS — R41841 Cognitive communication deficit: Secondary | ICD-10-CM | POA: Diagnosis not present

## 2023-04-19 DIAGNOSIS — K219 Gastro-esophageal reflux disease without esophagitis: Secondary | ICD-10-CM | POA: Diagnosis not present

## 2023-04-19 DIAGNOSIS — M6259 Muscle wasting and atrophy, not elsewhere classified, multiple sites: Secondary | ICD-10-CM | POA: Diagnosis not present

## 2023-04-19 DIAGNOSIS — R2689 Other abnormalities of gait and mobility: Secondary | ICD-10-CM | POA: Diagnosis not present

## 2023-04-22 DIAGNOSIS — K219 Gastro-esophageal reflux disease without esophagitis: Secondary | ICD-10-CM | POA: Diagnosis not present

## 2023-04-22 DIAGNOSIS — M6259 Muscle wasting and atrophy, not elsewhere classified, multiple sites: Secondary | ICD-10-CM | POA: Diagnosis not present

## 2023-04-22 DIAGNOSIS — R41841 Cognitive communication deficit: Secondary | ICD-10-CM | POA: Diagnosis not present

## 2023-04-22 DIAGNOSIS — R2689 Other abnormalities of gait and mobility: Secondary | ICD-10-CM | POA: Diagnosis not present

## 2023-04-23 DIAGNOSIS — R41841 Cognitive communication deficit: Secondary | ICD-10-CM | POA: Diagnosis not present

## 2023-04-23 DIAGNOSIS — M6259 Muscle wasting and atrophy, not elsewhere classified, multiple sites: Secondary | ICD-10-CM | POA: Diagnosis not present

## 2023-04-23 DIAGNOSIS — R2689 Other abnormalities of gait and mobility: Secondary | ICD-10-CM | POA: Diagnosis not present

## 2023-04-23 DIAGNOSIS — K219 Gastro-esophageal reflux disease without esophagitis: Secondary | ICD-10-CM | POA: Diagnosis not present

## 2023-04-24 DIAGNOSIS — K219 Gastro-esophageal reflux disease without esophagitis: Secondary | ICD-10-CM | POA: Diagnosis not present

## 2023-04-24 DIAGNOSIS — R2689 Other abnormalities of gait and mobility: Secondary | ICD-10-CM | POA: Diagnosis not present

## 2023-04-24 DIAGNOSIS — M6259 Muscle wasting and atrophy, not elsewhere classified, multiple sites: Secondary | ICD-10-CM | POA: Diagnosis not present

## 2023-04-24 DIAGNOSIS — R41841 Cognitive communication deficit: Secondary | ICD-10-CM | POA: Diagnosis not present

## 2023-04-25 DIAGNOSIS — R41841 Cognitive communication deficit: Secondary | ICD-10-CM | POA: Diagnosis not present

## 2023-04-25 DIAGNOSIS — R2689 Other abnormalities of gait and mobility: Secondary | ICD-10-CM | POA: Diagnosis not present

## 2023-04-25 DIAGNOSIS — K219 Gastro-esophageal reflux disease without esophagitis: Secondary | ICD-10-CM | POA: Diagnosis not present

## 2023-04-25 DIAGNOSIS — M6259 Muscle wasting and atrophy, not elsewhere classified, multiple sites: Secondary | ICD-10-CM | POA: Diagnosis not present

## 2023-04-26 DIAGNOSIS — M6259 Muscle wasting and atrophy, not elsewhere classified, multiple sites: Secondary | ICD-10-CM | POA: Diagnosis not present

## 2023-04-26 DIAGNOSIS — R41841 Cognitive communication deficit: Secondary | ICD-10-CM | POA: Diagnosis not present

## 2023-04-26 DIAGNOSIS — R2689 Other abnormalities of gait and mobility: Secondary | ICD-10-CM | POA: Diagnosis not present

## 2023-04-26 DIAGNOSIS — K219 Gastro-esophageal reflux disease without esophagitis: Secondary | ICD-10-CM | POA: Diagnosis not present

## 2023-04-29 DIAGNOSIS — K219 Gastro-esophageal reflux disease without esophagitis: Secondary | ICD-10-CM | POA: Diagnosis not present

## 2023-04-29 DIAGNOSIS — R41841 Cognitive communication deficit: Secondary | ICD-10-CM | POA: Diagnosis not present

## 2023-04-29 DIAGNOSIS — M6259 Muscle wasting and atrophy, not elsewhere classified, multiple sites: Secondary | ICD-10-CM | POA: Diagnosis not present

## 2023-04-29 DIAGNOSIS — R2689 Other abnormalities of gait and mobility: Secondary | ICD-10-CM | POA: Diagnosis not present

## 2023-04-30 DIAGNOSIS — M6259 Muscle wasting and atrophy, not elsewhere classified, multiple sites: Secondary | ICD-10-CM | POA: Diagnosis not present

## 2023-04-30 DIAGNOSIS — R2689 Other abnormalities of gait and mobility: Secondary | ICD-10-CM | POA: Diagnosis not present

## 2023-04-30 DIAGNOSIS — R41841 Cognitive communication deficit: Secondary | ICD-10-CM | POA: Diagnosis not present

## 2023-04-30 DIAGNOSIS — K219 Gastro-esophageal reflux disease without esophagitis: Secondary | ICD-10-CM | POA: Diagnosis not present

## 2023-05-02 DIAGNOSIS — K219 Gastro-esophageal reflux disease without esophagitis: Secondary | ICD-10-CM | POA: Diagnosis not present

## 2023-05-02 DIAGNOSIS — M6259 Muscle wasting and atrophy, not elsewhere classified, multiple sites: Secondary | ICD-10-CM | POA: Diagnosis not present

## 2023-05-02 DIAGNOSIS — R2689 Other abnormalities of gait and mobility: Secondary | ICD-10-CM | POA: Diagnosis not present

## 2023-05-02 DIAGNOSIS — R41841 Cognitive communication deficit: Secondary | ICD-10-CM | POA: Diagnosis not present

## 2023-05-03 DIAGNOSIS — K219 Gastro-esophageal reflux disease without esophagitis: Secondary | ICD-10-CM | POA: Diagnosis not present

## 2023-05-03 DIAGNOSIS — M6259 Muscle wasting and atrophy, not elsewhere classified, multiple sites: Secondary | ICD-10-CM | POA: Diagnosis not present

## 2023-05-03 DIAGNOSIS — R2689 Other abnormalities of gait and mobility: Secondary | ICD-10-CM | POA: Diagnosis not present

## 2023-05-03 DIAGNOSIS — R41841 Cognitive communication deficit: Secondary | ICD-10-CM | POA: Diagnosis not present

## 2023-05-06 DIAGNOSIS — R41841 Cognitive communication deficit: Secondary | ICD-10-CM | POA: Diagnosis not present

## 2023-05-06 DIAGNOSIS — R2689 Other abnormalities of gait and mobility: Secondary | ICD-10-CM | POA: Diagnosis not present

## 2023-05-06 DIAGNOSIS — K219 Gastro-esophageal reflux disease without esophagitis: Secondary | ICD-10-CM | POA: Diagnosis not present

## 2023-05-06 DIAGNOSIS — M6259 Muscle wasting and atrophy, not elsewhere classified, multiple sites: Secondary | ICD-10-CM | POA: Diagnosis not present

## 2023-05-07 DIAGNOSIS — R41841 Cognitive communication deficit: Secondary | ICD-10-CM | POA: Diagnosis not present

## 2023-05-07 DIAGNOSIS — R2689 Other abnormalities of gait and mobility: Secondary | ICD-10-CM | POA: Diagnosis not present

## 2023-05-07 DIAGNOSIS — K219 Gastro-esophageal reflux disease without esophagitis: Secondary | ICD-10-CM | POA: Diagnosis not present

## 2023-05-07 DIAGNOSIS — M6259 Muscle wasting and atrophy, not elsewhere classified, multiple sites: Secondary | ICD-10-CM | POA: Diagnosis not present

## 2023-05-09 DIAGNOSIS — K219 Gastro-esophageal reflux disease without esophagitis: Secondary | ICD-10-CM | POA: Diagnosis not present

## 2023-05-09 DIAGNOSIS — M6259 Muscle wasting and atrophy, not elsewhere classified, multiple sites: Secondary | ICD-10-CM | POA: Diagnosis not present

## 2023-05-09 DIAGNOSIS — R2689 Other abnormalities of gait and mobility: Secondary | ICD-10-CM | POA: Diagnosis not present

## 2023-05-09 DIAGNOSIS — R41841 Cognitive communication deficit: Secondary | ICD-10-CM | POA: Diagnosis not present

## 2023-05-10 DIAGNOSIS — R2689 Other abnormalities of gait and mobility: Secondary | ICD-10-CM | POA: Diagnosis not present

## 2023-05-10 DIAGNOSIS — K219 Gastro-esophageal reflux disease without esophagitis: Secondary | ICD-10-CM | POA: Diagnosis not present

## 2023-05-10 DIAGNOSIS — M6259 Muscle wasting and atrophy, not elsewhere classified, multiple sites: Secondary | ICD-10-CM | POA: Diagnosis not present

## 2023-05-10 DIAGNOSIS — R41841 Cognitive communication deficit: Secondary | ICD-10-CM | POA: Diagnosis not present

## 2023-05-13 DIAGNOSIS — M6259 Muscle wasting and atrophy, not elsewhere classified, multiple sites: Secondary | ICD-10-CM | POA: Diagnosis not present

## 2023-05-13 DIAGNOSIS — R2689 Other abnormalities of gait and mobility: Secondary | ICD-10-CM | POA: Diagnosis not present

## 2023-05-14 DIAGNOSIS — R2689 Other abnormalities of gait and mobility: Secondary | ICD-10-CM | POA: Diagnosis not present

## 2023-05-14 DIAGNOSIS — M6259 Muscle wasting and atrophy, not elsewhere classified, multiple sites: Secondary | ICD-10-CM | POA: Diagnosis not present

## 2023-05-16 DIAGNOSIS — M6259 Muscle wasting and atrophy, not elsewhere classified, multiple sites: Secondary | ICD-10-CM | POA: Diagnosis not present

## 2023-05-16 DIAGNOSIS — R2689 Other abnormalities of gait and mobility: Secondary | ICD-10-CM | POA: Diagnosis not present

## 2023-05-17 DIAGNOSIS — R2689 Other abnormalities of gait and mobility: Secondary | ICD-10-CM | POA: Diagnosis not present

## 2023-05-17 DIAGNOSIS — M6259 Muscle wasting and atrophy, not elsewhere classified, multiple sites: Secondary | ICD-10-CM | POA: Diagnosis not present

## 2023-05-20 DIAGNOSIS — I739 Peripheral vascular disease, unspecified: Secondary | ICD-10-CM | POA: Diagnosis not present

## 2023-05-20 DIAGNOSIS — G309 Alzheimer's disease, unspecified: Secondary | ICD-10-CM | POA: Diagnosis not present

## 2023-05-20 DIAGNOSIS — I10 Essential (primary) hypertension: Secondary | ICD-10-CM | POA: Diagnosis not present

## 2023-05-20 DIAGNOSIS — E039 Hypothyroidism, unspecified: Secondary | ICD-10-CM | POA: Diagnosis not present

## 2023-05-20 DIAGNOSIS — M6259 Muscle wasting and atrophy, not elsewhere classified, multiple sites: Secondary | ICD-10-CM | POA: Diagnosis not present

## 2023-05-20 DIAGNOSIS — E785 Hyperlipidemia, unspecified: Secondary | ICD-10-CM | POA: Diagnosis not present

## 2023-05-20 DIAGNOSIS — R2689 Other abnormalities of gait and mobility: Secondary | ICD-10-CM | POA: Diagnosis not present

## 2023-05-21 DIAGNOSIS — M6259 Muscle wasting and atrophy, not elsewhere classified, multiple sites: Secondary | ICD-10-CM | POA: Diagnosis not present

## 2023-05-21 DIAGNOSIS — R2689 Other abnormalities of gait and mobility: Secondary | ICD-10-CM | POA: Diagnosis not present

## 2023-05-22 DIAGNOSIS — M6259 Muscle wasting and atrophy, not elsewhere classified, multiple sites: Secondary | ICD-10-CM | POA: Diagnosis not present

## 2023-05-22 DIAGNOSIS — R2689 Other abnormalities of gait and mobility: Secondary | ICD-10-CM | POA: Diagnosis not present

## 2023-05-23 DIAGNOSIS — M6259 Muscle wasting and atrophy, not elsewhere classified, multiple sites: Secondary | ICD-10-CM | POA: Diagnosis not present

## 2023-05-23 DIAGNOSIS — R2689 Other abnormalities of gait and mobility: Secondary | ICD-10-CM | POA: Diagnosis not present

## 2023-05-24 DIAGNOSIS — M6259 Muscle wasting and atrophy, not elsewhere classified, multiple sites: Secondary | ICD-10-CM | POA: Diagnosis not present

## 2023-05-24 DIAGNOSIS — R2689 Other abnormalities of gait and mobility: Secondary | ICD-10-CM | POA: Diagnosis not present

## 2023-05-26 DIAGNOSIS — M6259 Muscle wasting and atrophy, not elsewhere classified, multiple sites: Secondary | ICD-10-CM | POA: Diagnosis not present

## 2023-05-26 DIAGNOSIS — R2689 Other abnormalities of gait and mobility: Secondary | ICD-10-CM | POA: Diagnosis not present

## 2023-05-31 DIAGNOSIS — Z13228 Encounter for screening for other metabolic disorders: Secondary | ICD-10-CM | POA: Diagnosis not present

## 2023-05-31 DIAGNOSIS — D649 Anemia, unspecified: Secondary | ICD-10-CM | POA: Diagnosis not present

## 2023-06-03 DIAGNOSIS — M6259 Muscle wasting and atrophy, not elsewhere classified, multiple sites: Secondary | ICD-10-CM | POA: Diagnosis not present

## 2023-06-03 DIAGNOSIS — R2689 Other abnormalities of gait and mobility: Secondary | ICD-10-CM | POA: Diagnosis not present

## 2023-06-04 DIAGNOSIS — R2689 Other abnormalities of gait and mobility: Secondary | ICD-10-CM | POA: Diagnosis not present

## 2023-06-04 DIAGNOSIS — M6259 Muscle wasting and atrophy, not elsewhere classified, multiple sites: Secondary | ICD-10-CM | POA: Diagnosis not present

## 2023-06-05 DIAGNOSIS — M6259 Muscle wasting and atrophy, not elsewhere classified, multiple sites: Secondary | ICD-10-CM | POA: Diagnosis not present

## 2023-06-05 DIAGNOSIS — R2689 Other abnormalities of gait and mobility: Secondary | ICD-10-CM | POA: Diagnosis not present

## 2023-06-06 DIAGNOSIS — M6259 Muscle wasting and atrophy, not elsewhere classified, multiple sites: Secondary | ICD-10-CM | POA: Diagnosis not present

## 2023-06-06 DIAGNOSIS — R2689 Other abnormalities of gait and mobility: Secondary | ICD-10-CM | POA: Diagnosis not present

## 2023-06-20 DIAGNOSIS — G309 Alzheimer's disease, unspecified: Secondary | ICD-10-CM | POA: Diagnosis not present

## 2023-06-20 DIAGNOSIS — F0280 Dementia in other diseases classified elsewhere without behavioral disturbance: Secondary | ICD-10-CM | POA: Diagnosis not present

## 2023-06-20 DIAGNOSIS — E785 Hyperlipidemia, unspecified: Secondary | ICD-10-CM | POA: Diagnosis not present

## 2023-06-20 DIAGNOSIS — I10 Essential (primary) hypertension: Secondary | ICD-10-CM | POA: Diagnosis not present

## 2023-07-03 DIAGNOSIS — M6281 Muscle weakness (generalized): Secondary | ICD-10-CM | POA: Diagnosis not present

## 2023-07-04 DIAGNOSIS — M6281 Muscle weakness (generalized): Secondary | ICD-10-CM | POA: Diagnosis not present

## 2023-07-05 DIAGNOSIS — M6281 Muscle weakness (generalized): Secondary | ICD-10-CM | POA: Diagnosis not present

## 2023-07-08 DIAGNOSIS — M6281 Muscle weakness (generalized): Secondary | ICD-10-CM | POA: Diagnosis not present

## 2023-07-16 DIAGNOSIS — M6281 Muscle weakness (generalized): Secondary | ICD-10-CM | POA: Diagnosis not present

## 2023-07-17 DIAGNOSIS — M6281 Muscle weakness (generalized): Secondary | ICD-10-CM | POA: Diagnosis not present

## 2023-07-18 DIAGNOSIS — M6281 Muscle weakness (generalized): Secondary | ICD-10-CM | POA: Diagnosis not present

## 2023-07-20 DIAGNOSIS — M6281 Muscle weakness (generalized): Secondary | ICD-10-CM | POA: Diagnosis not present

## 2023-07-22 DIAGNOSIS — M6281 Muscle weakness (generalized): Secondary | ICD-10-CM | POA: Diagnosis not present

## 2023-07-23 DIAGNOSIS — I739 Peripheral vascular disease, unspecified: Secondary | ICD-10-CM | POA: Diagnosis not present

## 2023-07-23 DIAGNOSIS — E039 Hypothyroidism, unspecified: Secondary | ICD-10-CM | POA: Diagnosis not present

## 2023-07-23 DIAGNOSIS — M1990 Unspecified osteoarthritis, unspecified site: Secondary | ICD-10-CM | POA: Diagnosis not present

## 2023-08-02 DIAGNOSIS — I739 Peripheral vascular disease, unspecified: Secondary | ICD-10-CM | POA: Diagnosis not present

## 2023-08-02 DIAGNOSIS — L603 Nail dystrophy: Secondary | ICD-10-CM | POA: Diagnosis not present

## 2023-08-02 DIAGNOSIS — L602 Onychogryphosis: Secondary | ICD-10-CM | POA: Diagnosis not present

## 2023-08-15 DIAGNOSIS — R278 Other lack of coordination: Secondary | ICD-10-CM | POA: Diagnosis not present

## 2023-08-15 DIAGNOSIS — Z741 Need for assistance with personal care: Secondary | ICD-10-CM | POA: Diagnosis not present

## 2023-08-15 DIAGNOSIS — E039 Hypothyroidism, unspecified: Secondary | ICD-10-CM | POA: Diagnosis not present

## 2023-08-15 DIAGNOSIS — E559 Vitamin D deficiency, unspecified: Secondary | ICD-10-CM | POA: Diagnosis not present

## 2023-08-15 DIAGNOSIS — R2681 Unsteadiness on feet: Secondary | ICD-10-CM | POA: Diagnosis not present

## 2023-08-15 DIAGNOSIS — R2689 Other abnormalities of gait and mobility: Secondary | ICD-10-CM | POA: Diagnosis not present

## 2023-08-15 DIAGNOSIS — M6259 Muscle wasting and atrophy, not elsewhere classified, multiple sites: Secondary | ICD-10-CM | POA: Diagnosis not present

## 2023-08-15 DIAGNOSIS — M6281 Muscle weakness (generalized): Secondary | ICD-10-CM | POA: Diagnosis not present

## 2023-08-15 DIAGNOSIS — K219 Gastro-esophageal reflux disease without esophagitis: Secondary | ICD-10-CM | POA: Diagnosis not present

## 2023-08-15 DIAGNOSIS — R1311 Dysphagia, oral phase: Secondary | ICD-10-CM | POA: Diagnosis not present

## 2023-08-16 DIAGNOSIS — M6281 Muscle weakness (generalized): Secondary | ICD-10-CM | POA: Diagnosis not present

## 2023-08-16 DIAGNOSIS — E039 Hypothyroidism, unspecified: Secondary | ICD-10-CM | POA: Diagnosis not present

## 2023-08-16 DIAGNOSIS — K219 Gastro-esophageal reflux disease without esophagitis: Secondary | ICD-10-CM | POA: Diagnosis not present

## 2023-08-16 DIAGNOSIS — M6259 Muscle wasting and atrophy, not elsewhere classified, multiple sites: Secondary | ICD-10-CM | POA: Diagnosis not present

## 2023-08-16 DIAGNOSIS — E559 Vitamin D deficiency, unspecified: Secondary | ICD-10-CM | POA: Diagnosis not present

## 2023-08-16 DIAGNOSIS — R1311 Dysphagia, oral phase: Secondary | ICD-10-CM | POA: Diagnosis not present

## 2023-08-19 DIAGNOSIS — E559 Vitamin D deficiency, unspecified: Secondary | ICD-10-CM | POA: Diagnosis not present

## 2023-08-19 DIAGNOSIS — M6281 Muscle weakness (generalized): Secondary | ICD-10-CM | POA: Diagnosis not present

## 2023-08-19 DIAGNOSIS — M6259 Muscle wasting and atrophy, not elsewhere classified, multiple sites: Secondary | ICD-10-CM | POA: Diagnosis not present

## 2023-08-19 DIAGNOSIS — K219 Gastro-esophageal reflux disease without esophagitis: Secondary | ICD-10-CM | POA: Diagnosis not present

## 2023-08-19 DIAGNOSIS — E039 Hypothyroidism, unspecified: Secondary | ICD-10-CM | POA: Diagnosis not present

## 2023-08-19 DIAGNOSIS — R1311 Dysphagia, oral phase: Secondary | ICD-10-CM | POA: Diagnosis not present

## 2023-08-20 DIAGNOSIS — E039 Hypothyroidism, unspecified: Secondary | ICD-10-CM | POA: Diagnosis not present

## 2023-08-20 DIAGNOSIS — E559 Vitamin D deficiency, unspecified: Secondary | ICD-10-CM | POA: Diagnosis not present

## 2023-08-20 DIAGNOSIS — M6259 Muscle wasting and atrophy, not elsewhere classified, multiple sites: Secondary | ICD-10-CM | POA: Diagnosis not present

## 2023-08-20 DIAGNOSIS — R1311 Dysphagia, oral phase: Secondary | ICD-10-CM | POA: Diagnosis not present

## 2023-08-20 DIAGNOSIS — M6281 Muscle weakness (generalized): Secondary | ICD-10-CM | POA: Diagnosis not present

## 2023-08-20 DIAGNOSIS — K219 Gastro-esophageal reflux disease without esophagitis: Secondary | ICD-10-CM | POA: Diagnosis not present

## 2023-08-21 DIAGNOSIS — M6281 Muscle weakness (generalized): Secondary | ICD-10-CM | POA: Diagnosis not present

## 2023-08-21 DIAGNOSIS — E7800 Pure hypercholesterolemia, unspecified: Secondary | ICD-10-CM | POA: Diagnosis not present

## 2023-08-21 DIAGNOSIS — M6259 Muscle wasting and atrophy, not elsewhere classified, multiple sites: Secondary | ICD-10-CM | POA: Diagnosis not present

## 2023-08-21 DIAGNOSIS — K219 Gastro-esophageal reflux disease without esophagitis: Secondary | ICD-10-CM | POA: Diagnosis not present

## 2023-08-21 DIAGNOSIS — R1311 Dysphagia, oral phase: Secondary | ICD-10-CM | POA: Diagnosis not present

## 2023-08-21 DIAGNOSIS — E039 Hypothyroidism, unspecified: Secondary | ICD-10-CM | POA: Diagnosis not present

## 2023-08-21 DIAGNOSIS — E559 Vitamin D deficiency, unspecified: Secondary | ICD-10-CM | POA: Diagnosis not present

## 2023-08-22 DIAGNOSIS — E559 Vitamin D deficiency, unspecified: Secondary | ICD-10-CM | POA: Diagnosis not present

## 2023-08-22 DIAGNOSIS — K219 Gastro-esophageal reflux disease without esophagitis: Secondary | ICD-10-CM | POA: Diagnosis not present

## 2023-08-22 DIAGNOSIS — M6259 Muscle wasting and atrophy, not elsewhere classified, multiple sites: Secondary | ICD-10-CM | POA: Diagnosis not present

## 2023-08-22 DIAGNOSIS — R1311 Dysphagia, oral phase: Secondary | ICD-10-CM | POA: Diagnosis not present

## 2023-08-22 DIAGNOSIS — M6281 Muscle weakness (generalized): Secondary | ICD-10-CM | POA: Diagnosis not present

## 2023-08-22 DIAGNOSIS — E039 Hypothyroidism, unspecified: Secondary | ICD-10-CM | POA: Diagnosis not present

## 2023-08-23 DIAGNOSIS — K219 Gastro-esophageal reflux disease without esophagitis: Secondary | ICD-10-CM | POA: Diagnosis not present

## 2023-08-23 DIAGNOSIS — M6259 Muscle wasting and atrophy, not elsewhere classified, multiple sites: Secondary | ICD-10-CM | POA: Diagnosis not present

## 2023-08-23 DIAGNOSIS — M6281 Muscle weakness (generalized): Secondary | ICD-10-CM | POA: Diagnosis not present

## 2023-08-23 DIAGNOSIS — E559 Vitamin D deficiency, unspecified: Secondary | ICD-10-CM | POA: Diagnosis not present

## 2023-08-23 DIAGNOSIS — E039 Hypothyroidism, unspecified: Secondary | ICD-10-CM | POA: Diagnosis not present

## 2023-08-23 DIAGNOSIS — R1311 Dysphagia, oral phase: Secondary | ICD-10-CM | POA: Diagnosis not present

## 2023-08-26 DIAGNOSIS — E039 Hypothyroidism, unspecified: Secondary | ICD-10-CM | POA: Diagnosis not present

## 2023-08-26 DIAGNOSIS — R1311 Dysphagia, oral phase: Secondary | ICD-10-CM | POA: Diagnosis not present

## 2023-08-26 DIAGNOSIS — M6259 Muscle wasting and atrophy, not elsewhere classified, multiple sites: Secondary | ICD-10-CM | POA: Diagnosis not present

## 2023-08-26 DIAGNOSIS — E559 Vitamin D deficiency, unspecified: Secondary | ICD-10-CM | POA: Diagnosis not present

## 2023-08-26 DIAGNOSIS — K219 Gastro-esophageal reflux disease without esophagitis: Secondary | ICD-10-CM | POA: Diagnosis not present

## 2023-08-26 DIAGNOSIS — M6281 Muscle weakness (generalized): Secondary | ICD-10-CM | POA: Diagnosis not present

## 2023-08-27 DIAGNOSIS — E559 Vitamin D deficiency, unspecified: Secondary | ICD-10-CM | POA: Diagnosis not present

## 2023-08-27 DIAGNOSIS — E039 Hypothyroidism, unspecified: Secondary | ICD-10-CM | POA: Diagnosis not present

## 2023-08-27 DIAGNOSIS — M6281 Muscle weakness (generalized): Secondary | ICD-10-CM | POA: Diagnosis not present

## 2023-08-27 DIAGNOSIS — K219 Gastro-esophageal reflux disease without esophagitis: Secondary | ICD-10-CM | POA: Diagnosis not present

## 2023-08-27 DIAGNOSIS — M6259 Muscle wasting and atrophy, not elsewhere classified, multiple sites: Secondary | ICD-10-CM | POA: Diagnosis not present

## 2023-08-27 DIAGNOSIS — R1311 Dysphagia, oral phase: Secondary | ICD-10-CM | POA: Diagnosis not present

## 2023-08-28 DIAGNOSIS — M6259 Muscle wasting and atrophy, not elsewhere classified, multiple sites: Secondary | ICD-10-CM | POA: Diagnosis not present

## 2023-08-28 DIAGNOSIS — M6281 Muscle weakness (generalized): Secondary | ICD-10-CM | POA: Diagnosis not present

## 2023-08-28 DIAGNOSIS — R1311 Dysphagia, oral phase: Secondary | ICD-10-CM | POA: Diagnosis not present

## 2023-08-28 DIAGNOSIS — E559 Vitamin D deficiency, unspecified: Secondary | ICD-10-CM | POA: Diagnosis not present

## 2023-08-28 DIAGNOSIS — K219 Gastro-esophageal reflux disease without esophagitis: Secondary | ICD-10-CM | POA: Diagnosis not present

## 2023-08-28 DIAGNOSIS — E039 Hypothyroidism, unspecified: Secondary | ICD-10-CM | POA: Diagnosis not present

## 2023-08-29 DIAGNOSIS — K219 Gastro-esophageal reflux disease without esophagitis: Secondary | ICD-10-CM | POA: Diagnosis not present

## 2023-08-29 DIAGNOSIS — E039 Hypothyroidism, unspecified: Secondary | ICD-10-CM | POA: Diagnosis not present

## 2023-08-29 DIAGNOSIS — E559 Vitamin D deficiency, unspecified: Secondary | ICD-10-CM | POA: Diagnosis not present

## 2023-08-29 DIAGNOSIS — M6259 Muscle wasting and atrophy, not elsewhere classified, multiple sites: Secondary | ICD-10-CM | POA: Diagnosis not present

## 2023-08-29 DIAGNOSIS — M6281 Muscle weakness (generalized): Secondary | ICD-10-CM | POA: Diagnosis not present

## 2023-08-29 DIAGNOSIS — R1311 Dysphagia, oral phase: Secondary | ICD-10-CM | POA: Diagnosis not present

## 2023-08-30 DIAGNOSIS — M6259 Muscle wasting and atrophy, not elsewhere classified, multiple sites: Secondary | ICD-10-CM | POA: Diagnosis not present

## 2023-08-30 DIAGNOSIS — E559 Vitamin D deficiency, unspecified: Secondary | ICD-10-CM | POA: Diagnosis not present

## 2023-08-30 DIAGNOSIS — K219 Gastro-esophageal reflux disease without esophagitis: Secondary | ICD-10-CM | POA: Diagnosis not present

## 2023-08-30 DIAGNOSIS — M6281 Muscle weakness (generalized): Secondary | ICD-10-CM | POA: Diagnosis not present

## 2023-08-30 DIAGNOSIS — R1311 Dysphagia, oral phase: Secondary | ICD-10-CM | POA: Diagnosis not present

## 2023-08-30 DIAGNOSIS — E039 Hypothyroidism, unspecified: Secondary | ICD-10-CM | POA: Diagnosis not present

## 2023-08-31 DIAGNOSIS — M6259 Muscle wasting and atrophy, not elsewhere classified, multiple sites: Secondary | ICD-10-CM | POA: Diagnosis not present

## 2023-08-31 DIAGNOSIS — E559 Vitamin D deficiency, unspecified: Secondary | ICD-10-CM | POA: Diagnosis not present

## 2023-08-31 DIAGNOSIS — R1311 Dysphagia, oral phase: Secondary | ICD-10-CM | POA: Diagnosis not present

## 2023-08-31 DIAGNOSIS — E039 Hypothyroidism, unspecified: Secondary | ICD-10-CM | POA: Diagnosis not present

## 2023-08-31 DIAGNOSIS — K219 Gastro-esophageal reflux disease without esophagitis: Secondary | ICD-10-CM | POA: Diagnosis not present

## 2023-08-31 DIAGNOSIS — M6281 Muscle weakness (generalized): Secondary | ICD-10-CM | POA: Diagnosis not present

## 2023-09-02 DIAGNOSIS — M6259 Muscle wasting and atrophy, not elsewhere classified, multiple sites: Secondary | ICD-10-CM | POA: Diagnosis not present

## 2023-09-02 DIAGNOSIS — K219 Gastro-esophageal reflux disease without esophagitis: Secondary | ICD-10-CM | POA: Diagnosis not present

## 2023-09-02 DIAGNOSIS — E559 Vitamin D deficiency, unspecified: Secondary | ICD-10-CM | POA: Diagnosis not present

## 2023-09-02 DIAGNOSIS — R1311 Dysphagia, oral phase: Secondary | ICD-10-CM | POA: Diagnosis not present

## 2023-09-02 DIAGNOSIS — M6281 Muscle weakness (generalized): Secondary | ICD-10-CM | POA: Diagnosis not present

## 2023-09-02 DIAGNOSIS — E039 Hypothyroidism, unspecified: Secondary | ICD-10-CM | POA: Diagnosis not present

## 2023-09-03 DIAGNOSIS — M6259 Muscle wasting and atrophy, not elsewhere classified, multiple sites: Secondary | ICD-10-CM | POA: Diagnosis not present

## 2023-09-03 DIAGNOSIS — M6281 Muscle weakness (generalized): Secondary | ICD-10-CM | POA: Diagnosis not present

## 2023-09-03 DIAGNOSIS — E559 Vitamin D deficiency, unspecified: Secondary | ICD-10-CM | POA: Diagnosis not present

## 2023-09-03 DIAGNOSIS — K219 Gastro-esophageal reflux disease without esophagitis: Secondary | ICD-10-CM | POA: Diagnosis not present

## 2023-09-03 DIAGNOSIS — E039 Hypothyroidism, unspecified: Secondary | ICD-10-CM | POA: Diagnosis not present

## 2023-09-03 DIAGNOSIS — R1311 Dysphagia, oral phase: Secondary | ICD-10-CM | POA: Diagnosis not present

## 2023-09-05 DIAGNOSIS — M6259 Muscle wasting and atrophy, not elsewhere classified, multiple sites: Secondary | ICD-10-CM | POA: Diagnosis not present

## 2023-09-05 DIAGNOSIS — K219 Gastro-esophageal reflux disease without esophagitis: Secondary | ICD-10-CM | POA: Diagnosis not present

## 2023-09-05 DIAGNOSIS — M6281 Muscle weakness (generalized): Secondary | ICD-10-CM | POA: Diagnosis not present

## 2023-09-05 DIAGNOSIS — E039 Hypothyroidism, unspecified: Secondary | ICD-10-CM | POA: Diagnosis not present

## 2023-09-05 DIAGNOSIS — E559 Vitamin D deficiency, unspecified: Secondary | ICD-10-CM | POA: Diagnosis not present

## 2023-09-05 DIAGNOSIS — R1311 Dysphagia, oral phase: Secondary | ICD-10-CM | POA: Diagnosis not present

## 2023-09-06 DIAGNOSIS — E559 Vitamin D deficiency, unspecified: Secondary | ICD-10-CM | POA: Diagnosis not present

## 2023-09-06 DIAGNOSIS — R1311 Dysphagia, oral phase: Secondary | ICD-10-CM | POA: Diagnosis not present

## 2023-09-06 DIAGNOSIS — K219 Gastro-esophageal reflux disease without esophagitis: Secondary | ICD-10-CM | POA: Diagnosis not present

## 2023-09-06 DIAGNOSIS — M6259 Muscle wasting and atrophy, not elsewhere classified, multiple sites: Secondary | ICD-10-CM | POA: Diagnosis not present

## 2023-09-06 DIAGNOSIS — M6281 Muscle weakness (generalized): Secondary | ICD-10-CM | POA: Diagnosis not present

## 2023-09-06 DIAGNOSIS — E039 Hypothyroidism, unspecified: Secondary | ICD-10-CM | POA: Diagnosis not present

## 2023-09-07 DIAGNOSIS — M6281 Muscle weakness (generalized): Secondary | ICD-10-CM | POA: Diagnosis not present

## 2023-09-07 DIAGNOSIS — M6259 Muscle wasting and atrophy, not elsewhere classified, multiple sites: Secondary | ICD-10-CM | POA: Diagnosis not present

## 2023-09-07 DIAGNOSIS — K219 Gastro-esophageal reflux disease without esophagitis: Secondary | ICD-10-CM | POA: Diagnosis not present

## 2023-09-07 DIAGNOSIS — E039 Hypothyroidism, unspecified: Secondary | ICD-10-CM | POA: Diagnosis not present

## 2023-09-07 DIAGNOSIS — E559 Vitamin D deficiency, unspecified: Secondary | ICD-10-CM | POA: Diagnosis not present

## 2023-09-07 DIAGNOSIS — R1311 Dysphagia, oral phase: Secondary | ICD-10-CM | POA: Diagnosis not present

## 2023-09-09 DIAGNOSIS — R1311 Dysphagia, oral phase: Secondary | ICD-10-CM | POA: Diagnosis not present

## 2023-09-09 DIAGNOSIS — M6259 Muscle wasting and atrophy, not elsewhere classified, multiple sites: Secondary | ICD-10-CM | POA: Diagnosis not present

## 2023-09-09 DIAGNOSIS — K219 Gastro-esophageal reflux disease without esophagitis: Secondary | ICD-10-CM | POA: Diagnosis not present

## 2023-09-09 DIAGNOSIS — E559 Vitamin D deficiency, unspecified: Secondary | ICD-10-CM | POA: Diagnosis not present

## 2023-09-09 DIAGNOSIS — E039 Hypothyroidism, unspecified: Secondary | ICD-10-CM | POA: Diagnosis not present

## 2023-09-09 DIAGNOSIS — M6281 Muscle weakness (generalized): Secondary | ICD-10-CM | POA: Diagnosis not present

## 2023-09-10 DIAGNOSIS — M6259 Muscle wasting and atrophy, not elsewhere classified, multiple sites: Secondary | ICD-10-CM | POA: Diagnosis not present

## 2023-09-10 DIAGNOSIS — E559 Vitamin D deficiency, unspecified: Secondary | ICD-10-CM | POA: Diagnosis not present

## 2023-09-10 DIAGNOSIS — K219 Gastro-esophageal reflux disease without esophagitis: Secondary | ICD-10-CM | POA: Diagnosis not present

## 2023-09-10 DIAGNOSIS — E039 Hypothyroidism, unspecified: Secondary | ICD-10-CM | POA: Diagnosis not present

## 2023-09-10 DIAGNOSIS — R1311 Dysphagia, oral phase: Secondary | ICD-10-CM | POA: Diagnosis not present

## 2023-09-10 DIAGNOSIS — M6281 Muscle weakness (generalized): Secondary | ICD-10-CM | POA: Diagnosis not present

## 2024-07-11 DEATH — deceased
